# Patient Record
Sex: Male | Born: 1935 | Race: White | Hispanic: No | Marital: Married | Smoking: Never smoker
Health system: Southern US, Community
[De-identification: ages and names within clinical notes are randomized; demographics above are authoritative.]

## PROBLEM LIST (undated history)

## (undated) DIAGNOSIS — C439 Malignant melanoma of skin, unspecified: Secondary | ICD-10-CM

## (undated) DIAGNOSIS — I429 Cardiomyopathy, unspecified: Secondary | ICD-10-CM

## (undated) DIAGNOSIS — C4491 Basal cell carcinoma of skin, unspecified: Secondary | ICD-10-CM

## (undated) DIAGNOSIS — I509 Heart failure, unspecified: Secondary | ICD-10-CM

## (undated) DIAGNOSIS — I4891 Unspecified atrial fibrillation: Secondary | ICD-10-CM

## (undated) DIAGNOSIS — M1990 Unspecified osteoarthritis, unspecified site: Secondary | ICD-10-CM

## (undated) DIAGNOSIS — I38 Endocarditis, valve unspecified: Secondary | ICD-10-CM

## (undated) DIAGNOSIS — K219 Gastro-esophageal reflux disease without esophagitis: Secondary | ICD-10-CM

## (undated) DIAGNOSIS — F32A Depression, unspecified: Secondary | ICD-10-CM

## (undated) DIAGNOSIS — F419 Anxiety disorder, unspecified: Secondary | ICD-10-CM

## (undated) DIAGNOSIS — I639 Cerebral infarction, unspecified: Secondary | ICD-10-CM

## (undated) DIAGNOSIS — I499 Cardiac arrhythmia, unspecified: Secondary | ICD-10-CM

## (undated) DIAGNOSIS — R251 Tremor, unspecified: Secondary | ICD-10-CM

## (undated) DIAGNOSIS — H3530 Unspecified macular degeneration: Secondary | ICD-10-CM

## (undated) DIAGNOSIS — F411 Generalized anxiety disorder: Secondary | ICD-10-CM

## (undated) DIAGNOSIS — E785 Hyperlipidemia, unspecified: Secondary | ICD-10-CM

## (undated) DIAGNOSIS — Z87442 Personal history of urinary calculi: Secondary | ICD-10-CM

## (undated) DIAGNOSIS — F319 Bipolar disorder, unspecified: Secondary | ICD-10-CM

## (undated) DIAGNOSIS — E039 Hypothyroidism, unspecified: Secondary | ICD-10-CM

## (undated) DIAGNOSIS — F329 Major depressive disorder, single episode, unspecified: Secondary | ICD-10-CM

## (undated) HISTORY — DX: Basal cell carcinoma of skin, unspecified: C44.91

## (undated) HISTORY — DX: Gastro-esophageal reflux disease without esophagitis: K21.9

## (undated) HISTORY — DX: Unspecified atrial fibrillation: I48.91

## (undated) HISTORY — DX: Personal history of urinary calculi: Z87.442

## (undated) HISTORY — DX: Unspecified osteoarthritis, unspecified site: M19.90

## (undated) HISTORY — PX: CATARACT EXTRACTION W/ INTRAOCULAR LENS IMPLANT: SHX1309

## (undated) HISTORY — DX: Heart failure, unspecified: I50.9

## (undated) HISTORY — DX: Bipolar disorder, unspecified: F31.9

## (undated) HISTORY — DX: Cardiac arrhythmia, unspecified: I49.9

## (undated) HISTORY — PX: JOINT REPLACEMENT: SHX530

## (undated) HISTORY — DX: Cerebral infarction, unspecified: I63.9

## (undated) HISTORY — PX: SKIN CANCER EXCISION: SHX779

## (undated) HISTORY — DX: Generalized anxiety disorder: F41.1

## (undated) HISTORY — PX: EYE SURGERY: SHX253

## (undated) HISTORY — DX: Anxiety disorder, unspecified: F41.9

## (undated) HISTORY — DX: Unspecified macular degeneration: H35.30

---

## 1989-04-12 HISTORY — PX: BACK SURGERY: SHX140

## 2006-04-12 DIAGNOSIS — C4491 Basal cell carcinoma of skin, unspecified: Secondary | ICD-10-CM

## 2006-04-12 HISTORY — DX: Basal cell carcinoma of skin, unspecified: C44.91

## 2009-02-20 ENCOUNTER — Ambulatory Visit: Payer: Self-pay | Admitting: Family Medicine

## 2009-02-20 DIAGNOSIS — F319 Bipolar disorder, unspecified: Secondary | ICD-10-CM

## 2009-02-24 LAB — CONVERTED CEMR LAB
ALT: 19 units/L (ref 0–53)
Basophils Absolute: 0 10*3/uL (ref 0.0–0.1)
Basophils Relative: 0.2 % (ref 0.0–3.0)
Chloride: 105 meq/L (ref 96–112)
Cholesterol: 169 mg/dL (ref 0–200)
Creatinine, Ser: 1 mg/dL (ref 0.4–1.5)
Eosinophils Absolute: 0.1 10*3/uL (ref 0.0–0.7)
Folate: 7.7 ng/mL
HDL: 51.7 mg/dL (ref >39.00)
Hemoglobin: 13.8 g/dL (ref 13.0–17.0)
Lymphocytes Relative: 45.2 % (ref 12.0–46.0)
Platelets: 140 10*3/uL — ABNORMAL LOW (ref 150.0–400.0)
Potassium: 4.6 meq/L (ref 3.5–5.1)
RDW: 13.1 % (ref 11.5–14.6)
Sodium: 140 meq/L (ref 135–145)
TSH: 0.91 microintl units/mL (ref 0.35–5.50)
Triglycerides: 55 mg/dL (ref 0.0–149.0)
VLDL: 11 mg/dL (ref 0.0–40.0)
WBC: 4.7 10*3/uL (ref 4.5–10.5)

## 2009-02-27 ENCOUNTER — Telehealth (INDEPENDENT_AMBULATORY_CARE_PROVIDER_SITE_OTHER): Payer: Self-pay | Admitting: *Deleted

## 2009-03-03 ENCOUNTER — Telehealth: Payer: Self-pay | Admitting: Family Medicine

## 2009-03-04 ENCOUNTER — Ambulatory Visit: Payer: Self-pay | Admitting: Psychiatry

## 2009-03-04 ENCOUNTER — Encounter: Payer: Self-pay | Admitting: Family Medicine

## 2009-03-20 ENCOUNTER — Ambulatory Visit: Payer: Self-pay | Admitting: Family Medicine

## 2009-03-28 ENCOUNTER — Ambulatory Visit: Payer: Self-pay | Admitting: Family Medicine

## 2009-05-14 ENCOUNTER — Telehealth: Payer: Self-pay | Admitting: Family Medicine

## 2009-08-25 ENCOUNTER — Telehealth: Payer: Self-pay | Admitting: Family Medicine

## 2009-09-15 ENCOUNTER — Ambulatory Visit: Payer: Self-pay | Admitting: Family Medicine

## 2009-09-17 ENCOUNTER — Encounter: Payer: Self-pay | Admitting: Family Medicine

## 2009-11-10 DIAGNOSIS — I639 Cerebral infarction, unspecified: Secondary | ICD-10-CM

## 2009-11-10 HISTORY — DX: Cerebral infarction, unspecified: I63.9

## 2009-11-12 ENCOUNTER — Ambulatory Visit: Payer: Self-pay | Admitting: Cardiovascular Disease

## 2009-11-12 ENCOUNTER — Encounter (INDEPENDENT_AMBULATORY_CARE_PROVIDER_SITE_OTHER): Payer: Self-pay | Admitting: Neurology

## 2009-11-12 ENCOUNTER — Inpatient Hospital Stay (HOSPITAL_COMMUNITY): Admission: EM | Admit: 2009-11-12 | Discharge: 2009-11-17 | Payer: Self-pay | Admitting: Emergency Medicine

## 2009-11-13 ENCOUNTER — Ambulatory Visit: Payer: Self-pay | Admitting: Surgery

## 2009-11-13 ENCOUNTER — Encounter (INDEPENDENT_AMBULATORY_CARE_PROVIDER_SITE_OTHER): Payer: Self-pay | Admitting: Neurology

## 2009-11-14 ENCOUNTER — Encounter (INDEPENDENT_AMBULATORY_CARE_PROVIDER_SITE_OTHER): Payer: Self-pay | Admitting: Neurology

## 2009-11-14 ENCOUNTER — Encounter: Payer: Self-pay | Admitting: Cardiovascular Disease

## 2009-11-15 ENCOUNTER — Encounter: Payer: Self-pay | Admitting: Cardiovascular Disease

## 2009-11-16 ENCOUNTER — Encounter: Payer: Self-pay | Admitting: Cardiovascular Disease

## 2009-11-19 ENCOUNTER — Ambulatory Visit: Payer: Self-pay | Admitting: Cardiovascular Disease

## 2009-11-19 LAB — CONVERTED CEMR LAB

## 2009-11-24 ENCOUNTER — Ambulatory Visit: Payer: Self-pay | Admitting: Cardiovascular Disease

## 2009-11-26 ENCOUNTER — Encounter: Payer: Self-pay | Admitting: Cardiovascular Disease

## 2009-11-28 ENCOUNTER — Ambulatory Visit: Payer: Self-pay | Admitting: Cardiovascular Disease

## 2009-11-28 DIAGNOSIS — I4891 Unspecified atrial fibrillation: Secondary | ICD-10-CM

## 2009-11-28 DIAGNOSIS — I6350 Cerebral infarction due to unspecified occlusion or stenosis of unspecified cerebral artery: Secondary | ICD-10-CM | POA: Insufficient documentation

## 2009-11-28 DIAGNOSIS — I4892 Unspecified atrial flutter: Secondary | ICD-10-CM | POA: Insufficient documentation

## 2009-12-01 ENCOUNTER — Encounter: Payer: Self-pay | Admitting: Family Medicine

## 2009-12-03 ENCOUNTER — Ambulatory Visit: Payer: Self-pay | Admitting: Cardiovascular Disease

## 2009-12-08 ENCOUNTER — Telehealth: Payer: Self-pay | Admitting: Cardiology

## 2009-12-08 ENCOUNTER — Telehealth: Payer: Self-pay | Admitting: Cardiovascular Disease

## 2009-12-10 ENCOUNTER — Ambulatory Visit: Payer: Self-pay | Admitting: Cardiology

## 2009-12-11 ENCOUNTER — Encounter: Payer: Self-pay | Admitting: Cardiovascular Disease

## 2009-12-17 ENCOUNTER — Ambulatory Visit: Payer: Self-pay | Admitting: Cardiovascular Disease

## 2009-12-29 ENCOUNTER — Ambulatory Visit: Payer: Self-pay | Admitting: Cardiovascular Disease

## 2010-01-05 ENCOUNTER — Ambulatory Visit: Payer: Self-pay | Admitting: Internal Medicine

## 2010-01-05 LAB — CONVERTED CEMR LAB
INR: 2.1
POC INR: 2.1

## 2010-01-12 ENCOUNTER — Telehealth: Payer: Self-pay | Admitting: Family Medicine

## 2010-01-21 ENCOUNTER — Ambulatory Visit: Payer: Self-pay | Admitting: Cardiovascular Disease

## 2010-01-21 LAB — CONVERTED CEMR LAB: POC INR: 1.8

## 2010-02-04 ENCOUNTER — Ambulatory Visit: Payer: Self-pay | Admitting: Cardiovascular Disease

## 2010-02-04 LAB — CONVERTED CEMR LAB: POC INR: 2.5

## 2010-03-04 ENCOUNTER — Ambulatory Visit: Payer: Self-pay | Admitting: Cardiovascular Disease

## 2010-03-24 ENCOUNTER — Ambulatory Visit: Payer: Self-pay | Admitting: Family Medicine

## 2010-03-24 ENCOUNTER — Telehealth: Payer: Self-pay | Admitting: Family Medicine

## 2010-03-25 ENCOUNTER — Ambulatory Visit: Payer: Self-pay | Admitting: Cardiovascular Disease

## 2010-03-25 LAB — CONVERTED CEMR LAB: POC INR: 2.5

## 2010-03-26 LAB — CONVERTED CEMR LAB
Chloride: 102 meq/L (ref 96–112)
GFR calc non Af Amer: 71.9 mL/min (ref >60.00)
LDL Cholesterol: 122 mg/dL — ABNORMAL HIGH (ref 0–99)
Phosphorus: 3 mg/dL (ref 2.3–4.6)
Potassium: 4 meq/L (ref 3.5–5.1)
Sodium: 139 meq/L (ref 135–145)
Total CHOL/HDL Ratio: 4
Triglycerides: 59 mg/dL (ref 0.0–149.0)
VLDL: 11.8 mg/dL (ref 0.0–40.0)

## 2010-04-21 ENCOUNTER — Telehealth: Payer: Self-pay | Admitting: Family Medicine

## 2010-04-22 ENCOUNTER — Ambulatory Visit: Admission: RE | Admit: 2010-04-22 | Discharge: 2010-04-22 | Payer: Self-pay | Source: Home / Self Care

## 2010-04-22 LAB — CONVERTED CEMR LAB: POC INR: 2.1

## 2010-05-12 NOTE — Medication Information (Signed)
Summary: rov/ewj  Anticoagulant Therapy  Managed by: Cloyde Reams, RN, BSN PCP: Dr Franne Forts MD: Mariah Milling Indication 1: Atrial Fibrillation Indication 2: Cerebrovascular Accident Lab Used: [facility] [place] Site: [place] INR POC 2.5 INR RANGE 2.0-3.0  Dietary changes: no    Health status changes: no    Bleeding/hemorrhagic complications: no    Recent/future hospitalizations: no    Any changes in medication regimen? no    Recent/future dental: no  Any missed doses?: no       Is patient compliant with meds? yes       Allergies: No Known Drug Allergies  Anticoagulation Management History:      The patient is taking warfarin and comes in today for a routine follow up visit.  Positive risk factors for bleeding include an age of 28 years or older and history of CVA/TIA.  The bleeding index is 'intermediate risk'.  Positive CHADS2 values include Age > 68 years old and Prior Stroke/CVA/TIA.  His last INR was 2.1.  Anticoagulation responsible provider: [facility].  INR POC: 2.5.  Cuvette Lot#: [id number].  Exp: 02/2011.    Anticoagulation Management Assessment/Plan:      The patient's current anticoagulation dose is Warfarin sodium 5 mg tabs: Use as directed by Anticoagulation Clinic.  The target INR is 2.0-3.0.  The next INR is due 03/04/2010.  Results were reviewed/authorized by Cloyde Reams, RN, BSN.  He was notified by Cloyde Reams RN.         Prior Anticoagulation Instructions: INR 1.8  Take 1 tablet today, then resume same dosage 1 tablet daily except 1/2 tablet on Wednesdays and Fridays.  Recheck in 2-3 weeks.    Current Anticoagulation Instructions: INR 2.5  Continue on same dosage 1 tablet daily except 1/2 tablet on Wednesdays and Fridays.  Recheck in 4 weeks.

## 2010-05-12 NOTE — Medication Information (Signed)
Summary: David Duncan  Anticoagulant Therapy  Managed by: Cloyde Reams, RN, BSN PCP: Dr Franne Forts MD: Mariah Milling Indication 1: Atrial Fibrillation Indication 2: Cerebrovascular Accident Lab Used: [facility] [place] Site: [place] INR POC 2.0 INR RANGE 2.0-3.0  Dietary changes: no    Health status changes: no    Bleeding/hemorrhagic complications: no    Recent/future hospitalizations: no    Any changes in medication regimen? no    Recent/future dental: no  Any missed doses?: no       Is patient compliant with meds? yes      Comments: Pt going out of town 9/10-9/17.   Allergies: No Known Drug Allergies  Anticoagulation Management History:      The patient is taking warfarin and comes in today for a routine follow up visit.  Positive risk factors for bleeding include an age of 75 years or older and history of CVA/TIA.  The bleeding index is 'intermediate risk'.  Positive CHADS2 values include Age > 84 years old and Prior Stroke/CVA/TIA.  Anticoagulation responsible Shenell Rogalski: [facility].  INR POC: 2.0.  Cuvette Lot#: [id number].  Exp: 01/2011.    Anticoagulation Management Assessment/Plan:      The patient's current anticoagulation dose is Warfarin sodium 5 mg tabs: Use as directed by Anticoagulation Clinic.  The target INR is 2.0-3.0.  The next INR is due 12/29/2009.  Results were reviewed/authorized by Cloyde Reams, RN, BSN.  He was notified by Cloyde Reams RN.         Prior Anticoagulation Instructions: INR 1.4   Take 1 tablet today, then start taking 1/2 tablet daily except 1 tablet on Tuesdays, Thursdays, and Saturdays.  Recheck in 1 week.    Current Anticoagulation Instructions: INR 2.0  Start taking 1 tablet daily except 1/2 tablet on Mondays, Wednesdays, and Fridays.  Recheck in 1 week.

## 2010-05-12 NOTE — Assessment & Plan Note (Signed)
Summary: ARMS ARE ACHING   Vital Signs:  Patient profile:   77 year old male Height:      66 inches Weight:      158 pounds BMI:     25.59 Temp:     97.3 degrees F oral Pulse rate:   56 / minute Pulse rhythm:   regular BP sitting:   110 / 70  (left arm) Cuff size:   regular  Vitals Entered By: Lewanda Rife LPN (September 16, 270 8:57 AM) CC: both upper arms aching pt wants PT   History of Present Illness: wants a ref to physical therapy -- his rotator cuff and arm problems are worse with weather change  no tears- just tendonitis  both arms are sore  no numbness or weakness unless hand are up in the air   grip is ok  overall feels fine    Allergies (verified): No Known Drug Allergies  Past History:  Past Medical History: Last updated: 02/20/2009 GERD 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  bipolar (has been hosp in past) kidney stones 30 years ago  skin cancer on shoulder basal cell  macular degeneration   derm  opthy   Past Surgical History: Last updated: 02/20/2009 back surgery in 1991 skin cancer 2008-- basal cell  2008 colonoscopy ok  hosp in past for bipolar depression (no suicide att)   Family History: Last updated: 02/20/2009 mother  father- stroke  brother prostate ca brother CAD  brother HTN aunt DM  uncle - depression / commited suicide   Social History: Last updated: 02/20/2009 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 Eyes:  Denies blurring and eye irritation. CV:  Denies chest pain or discomfort, lightheadness, and palpitations. Resp:  Denies cough and wheezing. GI:  Denies abdominal pain, bloody stools, and change in bowel habits. MS:  Complains of joint pain and stiffness; denies joint redness, joint swelling, cramps, and muscle  weakness. Derm:  Denies itching, lesion(s), poor wound healing, and rash. Neuro:  Denies numbness, tingling, and weakness. Heme:  Denies abnormal bruising and bleeding.  Physical Exam  General:  Well-developed,well-nourished,in no acute distress; alert,appropriate and cooperative throughout examination Head:  normocephalic, atraumatic, and no abnormalities observed.   Eyes:  vision grossly intact, pupils equal, pupils round, and pupils reactive to light.   Neck:  supple with full rom and no masses or thyromegally, no JVD or carotid bruit  Lungs:  Normal respiratory effort, chest expands symmetrically. Lungs are clear to auscultation, no crackles or wheezes. Heart:  Normal rate and regular rhythm. S1 and S2 normal without gallop, murmur, click, rub or other extra sounds. Msk:  mild acromion tenderness pain to abduct arms over 90 deg neg hawking and neer tests  no bicep tendon tenderness nl pron/ supination  nl grip  Extremities:  no CCE Neurologic:  strength normal in all extremities, sensation intact to light touch, gait normal, and DTRs symmetrical and normal.   Skin:  Intact without suspicious lesions or rashes Cervical Nodes:  No lymphadenopathy noted Psych:  nl affect    Impression & Recommendations:  Problem # 1:  ARM PAIN (ICD-729.5) Assessment New intermittent from past rotator cuff tendonitis rom today quite good  ref to PT eval and treat  Orders: Physical Therapy Referral (PT)  Complete Medication List: 1)  Lamictal 200 Mg Tabs (Lamotrigine) .... Take one by mouth daily 2)  Clonazepam 0.5 Mg Tabs (Clonazepam) .... Take 1 tablet by mouth once a day 3)  Ginko Biloba  .[name] Kitchen.. 1 by mouth three times a day 4)  Glucosamine - Chondroiton  .[name] Kitchen.. 1 by mouth two times a day 5)  L Lysine 500 Mg  .[name] KitchenMarland Kitchen. 1 by mouth once daily 6)  L Tyrosine 500 Mg  .[name] KitchenMarland Kitchen. 1 by mouth two times a day 7)  Maxi Vision  .[name] Kitchen.. 1 by mouth twice daily 8)  Neuro Plus 100 Mg  .[name] KitchenMarland Kitchen. 1 by mouth twor times daily -  for memory 9)  Saw Palmetto 450 Mg  .[name] KitchenMarland Kitchen. 1 by mouth two times a day 10)  Serenity Extra Plus  .[name] KitchenMarland Kitchen. 1 by mouth two times a day 11)  Taurine 500 Mg  .[name] KitchenMarland Kitchen. 1 by mouth two times a day 12)  Amino Acid 16109 Iu  .[name] Kitchen.. 1 by mouth two  times a day 13)  Vitamin C 500 Mg  .[name] KitchenMarland Kitchen. 1 by mouth four  times a day 14)  Fish Oil (dry Eye Max Vision ) - For [place]  .[name] Kitchen.. 4 times per day 15)  B Complex With Niacin  .[name] Kitchen.. 1 by mouth three times a day 16)  Lactate 500  .[name] KitchenMarland Kitchen. 1 by mouth two times a day 17)  Lexapro 20 Mg Tabs (Escitalopram oxalate) .[name] Kitchen.. 1 by mouth once daily as directed  Patient Instructions: 1)  please refer to physical therapy at check out  2)  update me if arm pain worsens   Current Allergies (reviewed today): No known allergies

## 2010-05-12 NOTE — Progress Notes (Signed)
Summary: refill request for clonazepam  Phone Note Refill Request Message from:  Fax from Pharmacy  Refills Requested: Medication #1:  CLONAZEPAM .25 MG 1 by mouth once daily   Last Refilled: 03/03/2009 faxed request from [facility], request is for 0.5 mg, take one half daily.  Initial call taken by: Lowella Petties CMA,  May 14, 2009 2:08 PM  Follow-up for Phone Call        px written on EMR for call in  Follow-up by: Judith Part MD,  May 14, 2009 3:24 PM  Additional Follow-up for Phone Call Additional follow up Details #1::        Called to [facility]. Additional Follow-up by: Lowella Petties CMA,  May 14, 2009 3:27 PM    Prescriptions: CLONAZEPAM .25 MG 1 by mouth once daily  #90 x 1   Entered and Authorized by:   Judith Part MD   Signed by:   Judith Part MD on 05/14/2009   Method used:   Telephoned to ...         RxID:   [id number]

## 2010-05-12 NOTE — Progress Notes (Signed)
Summary: fatigue  Phone Note Call from Patient   Caller: Patient Details for Reason: Fatigue Summary of Call: Patient c/o feeling fatigue even though the metoprolol cut back to Metoprolol 50 1/2 tablet twice a day. He checked heart rate today and is reading 60 on his treadmill. Please contact with what to do. Initial call taken by: Bishop Dublin, CMA,  December 08, 2009 1:18 PM     Appended Document: fatigue Stop metoprolol for now.   Appended Document: fatigue notified patient's wife to have David Duncan stop Metoprolol for now and to keep check of heart rate readings and blood pressure.  If he has any symptoms he will call the office. He will also call to let us know if fatigue gets any better.  Appended Document: fatigue duplicate

## 2010-05-12 NOTE — Medication Information (Signed)
Summary: rov/ewj  Anticoagulant Therapy  Managed by: Bethena Midget, RN, BSN PCP: Dr Franne Forts MD: Mariah Milling Indication 1: Atrial Fibrillation Indication 2: Cerebrovascular Accident Lab Used: [facility] [place] Site: [place] INR POC 3.4 INR RANGE 2.0-3.0  Dietary changes: yes       Details: Cut back on leafy green veggies  Health status changes: no    Bleeding/hemorrhagic complications: no    Recent/future hospitalizations: no    Any changes in medication regimen? yes       Details: Lexapro stopped approx 1 mth ago  Recent/future dental: no  Any missed doses?: no       Is patient compliant with meds? yes       Allergies: No Known Drug Allergies  Anticoagulation Management History:      The patient is taking warfarin and comes in today for a routine follow up visit.  Positive risk factors for bleeding include an age of 75 years or older and history of CVA/TIA.  The bleeding index is 'intermediate risk'.  Positive CHADS2 values include Age > 70 years old and Prior Stroke/CVA/TIA.  His last INR was 2.1.  Anticoagulation responsible provider: [facility].  INR POC: 3.4.  Cuvette Lot#: [id number].  Exp: 03/2011.    Anticoagulation Management Assessment/Plan:      The patient's current anticoagulation dose is Warfarin sodium 5 mg tabs: Use as directed by Anticoagulation Clinic.  The target INR is 2.0-3.0.  The next INR is due 03/25/2010.  Anticoagulation instructions were given to patient.  Results were reviewed/authorized by Bethena Midget, RN, BSN.  He was notified by Bethena Midget, RN, BSN.         Prior Anticoagulation Instructions: INR 2.5  Continue on same dosage 1 tablet daily except 1/2 tablet on Wednesdays and Fridays.  Recheck in 4 weeks.    Current Anticoagulation Instructions: INR 3.4 Skip today's dose then resume 1 pill everyday except 1/2 pill on Wednesdays and Fridays. Recheck in 3 weeks.

## 2010-05-12 NOTE — Medication Information (Signed)
Summary: CCR/AMD  Anticoagulant Therapy  Managed by: Cloyde Reams, RN, BSN PCP: Dr Franne Forts MD: Mariah Milling Indication 1: Atrial Fibrillation Indication 2: Cerebrovascular Accident Lab Used: [facility] [place] Site: [place] INR POC 1.8 INR RANGE 2.0-3.0  Dietary changes: no    Health status changes: no    Bleeding/hemorrhagic complications: no    Recent/future hospitalizations: no    Any changes in medication regimen? no    Recent/future dental: no  Any missed doses?: yes     Details: Missed 1 dosage of Coumadin on Monday.    Is patient compliant with meds? yes       Allergies: No Known Drug Allergies  Anticoagulation Management History:      The patient is taking warfarin and comes in today for a routine follow up visit.  Positive risk factors for bleeding include an age of 75 years or older and history of CVA/TIA.  The bleeding index is 'intermediate risk'.  Positive CHADS2 values include Age > 32 years old and Prior Stroke/CVA/TIA.  His last INR was 2.1.  Anticoagulation responsible provider: [facility].  INR POC: 1.8.  Cuvette Lot#: [id number].  Exp: 02/2011.    Anticoagulation Management Assessment/Plan:      The patient's current anticoagulation dose is Warfarin sodium 5 mg tabs: Use as directed by Anticoagulation Clinic.  The target INR is 2.0-3.0.  The next INR is due 02/11/2010.  Results were reviewed/authorized by Cloyde Reams, RN, BSN.  He was notified by Cloyde Reams RN.         Prior Anticoagulation Instructions: INR 2.1   Continue taking 1 tab daily except for 1/2 tab on Wednesday and Friday. Recheck in 2 weeks.   Current Anticoagulation Instructions: INR 1.8  Take 1 tablet today, then resume same dosage 1 tablet daily except 1/2 tablet on Wednesdays and Fridays.  Recheck in 2-3 weeks.

## 2010-05-12 NOTE — Medication Information (Signed)
Summary: rov/ewj  Anticoagulant Therapy  Managed by: Cloyde Reams, RN, BSN PCP: Dr Milinda Antis Indication 1: Atrial Fibrillation Indication 2: Cerebrovascular Accident Lab Used: [facility] [place] Site: [place] INR POC 3.5 INR RANGE 2.0-3.0  Dietary changes: no     Bleeding/hemorrhagic complications: no     Any changes in medication regimen? no     Any missed doses?: no       Is patient compliant with meds? yes       Allergies: No Known Drug Allergies  Anticoagulation Management History:      The patient is taking warfarin and comes in today for a routine follow up visit.  Positive risk factors for bleeding include an age of 75 years or older.  The bleeding index is 'intermediate risk'.  Positive CHADS2 values include Age > 75 years old.  INR POC: 3.5.  Cuvette Lot#: [id number].  Exp: 01/2011.    Anticoagulation Management Assessment/Plan:      The patient's current anticoagulation dose is Warfarin sodium 5 mg tabs: Use as directed by Anticoagulation Clinic.  The target INR is 2.0-3.0.  The next INR is due 12/03/2009.  Results were reviewed/authorized by Cloyde Reams, RN, BSN.  He was notified by Benedict Needy, RN.         Prior Anticoagulation Instructions: INR 2.9  Continue on 5mg  daily.  Recheck on Tuesday.  Current Anticoagulation Instructions: INR 3.5  No COUMADIN today.  Start taking 1 tab daily except for 1/2 tab on Tuesday. Recheck in 1 week.

## 2010-05-12 NOTE — Progress Notes (Signed)
Summary: question re appt  Phone Note Call from Patient   Caller: Patient Reason for Call: Talk to Nurse Summary of Call: pt calling to say he needs a hospital fu with dr Eden Emms tomorrow-asked when he was dc-he said 3 weeks ago-looking in echart there is nothing there to say when or that he needs to see dr Despina Hick to schedule? [phone] Initial call taken by: Glynda Jaeger,  December 08, 2009 11:58 AM  Follow-up for Phone Call        spoke with pt, he is usually followed by dr Mariah Milling in [place] and was referred back to him Deliah Goody, RN  December 08, 2009 3:50 PM

## 2010-05-12 NOTE — Progress Notes (Signed)
Summary: Clonazepam 0.5mg  refill  Phone Note From Pharmacy Call back at [phone]   Caller: [place] Call For: Dr Milinda Antis  Summary of Call: [facility] faxed request for Clonazepam 0.5mg . Pt has been taking one tablet  instead of 1/2 tablet for over one month.  Pt wants to continue taking one 0.5mg  tablet daily. (Pt's med list  has Clonazepam 0.25mg  taking one daily). Please advise.  Initial call taken by: Lewanda Rife LPN,  Aug 25, 2009 3:56 PM  Follow-up for Phone Call        px written on EMR for call in  Follow-up by: Judith Part MD,  Aug 25, 2009 4:47 PM  Additional Follow-up for Phone Call Additional follow up Details #1::        Dr Milinda Antis do you want pt to have the 0.5mg  taking one a day he is requesting or give pt 0.25mg  taking one daily which is on med list.Please advise. Lewanda Rife LPN  Aug 25, 2009 5:15 PM     Additional Follow-up for Phone Call Additional follow up Details #2::    sorry about that the .5 is ok- I will change it  Follow-up by: Judith Part MD,  Aug 25, 2009 5:24 PM  Additional Follow-up for Phone Call Additional follow up Details #3:: Details for Additional Follow-up Action Taken: Medication phoned to [place] pharmacy as instructed. Lewanda Rife LPN  Aug 25, 2009 5:34 PM   New/Updated Medications: CLONAZEPAM 0.5 MG TABS (CLONAZEPAM)   Prescriptions: CLONAZEPAM 0.5 MG TABS (CLONAZEPAM)   #90 x 0   Entered and Authorized by:   Judith Part MD   Signed by:   Lewanda Rife LPN on 45/40/9811   Method used:   Telephoned to ...         RxID:   [id number] CLONAZEPAM .25 MG 1 by mouth once daily  #90 x 0   Entered and Authorized by:   Judith Part MD   Signed by:   Lewanda Rife LPN on 78/46/9629   Method used:   Telephoned to ...         RxID:   [id number]

## 2010-05-12 NOTE — Progress Notes (Signed)
Summary: clonazepam  Phone Note Refill Request Message from:  Fax from Pharmacy on January 12, 2010 10:53 AM  Refills Requested: Medication #1:  CLONAZEPAM 0.5 MG TABS Take 1 tablet by mouth once a day   Last Refilled: 09/19/2009 Refill request from [place]. [phone]  Initial call taken by: Melody Comas,  January 12, 2010 10:53 AM  Follow-up for Phone Call        did we px this last -- ? is not in med list that it came from [place] - please ask staff at [facility]  thanks  Follow-up by: Judith Part MD,  January 12, 2010 11:04 AM  Additional Follow-up for Phone Call Additional follow up Details #1::        Spoke with Amy at [facility]. Dr. Milinda Antis last refilled on 08/25/09. Lewanda Rife LPN  January 12, 2010 11:22 AM   thanks  px written on EMR for call in  Additional Follow-up by: Judith Part MD,  January 12, 2010 11:24 AM    Additional Follow-up for Phone Call Additional follow up Details #2::    Medication phoned to [facility]  pharmacy as instructed. Lewanda Rife LPN  January 12, 2010 11:41 AM   Prescriptions: CLONAZEPAM 0.5 MG TABS (CLONAZEPAM) Take 1 tablet by mouth once a day  #90 x 0   Entered and Authorized by:   Judith Part MD   Signed by:   Lewanda Rife LPN on 82/95/6213   Method used:   Telephoned to ...         RxID:   [id number]

## 2010-05-12 NOTE — Medication Information (Signed)
Summary: rov/ewj  Anticoagulant Therapy  Managed by: Cloyde Reams, RN, BSN PCP: Dr Franne Forts MD: Mariah Milling Indication 1: Atrial Fibrillation Indication 2: Cerebrovascular Accident Lab Used: [facility] [place] Site: [place] INR POC 1.8 INR RANGE 2.0-3.0  Dietary changes: no     Bleeding/hemorrhagic complications: no     Any changes in medication regimen? no     Any missed doses?: no       Is patient compliant with meds? yes       Allergies: No Known Drug Allergies  Anticoagulation Management History:      The patient is taking warfarin and comes in today for a routine follow up visit.  Positive risk factors for bleeding include an age of 75 years or older and history of CVA/TIA.  The bleeding index is 'intermediate risk'.  Positive CHADS2 values include Age > 75 years old and Prior Stroke/CVA/TIA.  Anticoagulation responsible provider: Heavin Sebree.  INR POC: 1.8.  Cuvette Lot#: [id number].  Exp: 01/2011.    Anticoagulation Management Assessment/Plan:      The patient's current anticoagulation dose is Warfarin sodium 5 mg tabs: Use as directed by Anticoagulation Clinic.  The target INR is 2.0-3.0.  The next INR is due 01/05/2010.  Results were reviewed/authorized by Cloyde Reams, RN, BSN.  He was notified by Benedict Needy, RN.         Prior Anticoagulation Instructions: INR 2.0  Start taking 1 tablet daily except 1/2 tablet on Mondays, Wednesdays, and Fridays.  Recheck in 1 week.    Current Anticoagulation Instructions: INR 1.8  Start taking 1 tab daily except for 1/2 tab on Wednesday and Friday. Recheck in 1 week.

## 2010-05-12 NOTE — Medication Information (Signed)
Summary: rov/ewj  Anticoagulant Therapy  Managed by: Cloyde Reams, RN, BSN PCP: Dr Franne Forts MD: Shirlee Latch MD, Freida Busman Indication 1: Atrial Fibrillation Indication 2: Cerebrovascular Accident Lab Used: [facility] [place] Site: [place] INR POC 1.4 INR RANGE 2.0-3.0  Dietary changes: no    Health status changes: no    Bleeding/hemorrhagic complications: no    Recent/future hospitalizations: no    Any changes in medication regimen? no    Recent/future dental: no  Any missed doses?: no       Is patient compliant with meds? yes       Allergies: No Known Drug Allergies  Anticoagulation Management History:      The patient is taking warfarin and comes in today for a routine follow up visit.  Positive risk factors for bleeding include an age of 75 years or older and history of CVA/TIA.  The bleeding index is 'intermediate risk'.  Positive CHADS2 values include Age > 21 years old and Prior Stroke/CVA/TIA.  Anticoagulation responsible provider: Shirlee Latch MD, [place].  INR POC: 1.4.  Cuvette Lot#: [id number].  Exp: 01/2011.    Anticoagulation Management Assessment/Plan:      The patient's current anticoagulation dose is Warfarin sodium 5 mg tabs: Use as directed by Anticoagulation Clinic.  The target INR is 2.0-3.0.  The next INR is due 12/17/2009.  Results were reviewed/authorized by Cloyde Reams, RN, BSN.  He was notified by Cloyde Reams RN.         Prior Anticoagulation Instructions: INR 3.5  No COUMADIN today.  Start taking 1 tab daily except for 1/2 tab on Tuesday. Recheck in 1 week.   Current Anticoagulation Instructions: INR 1.4   Take 1 tablet today, then start taking 1/2 tablet daily except 1 tablet on Tuesdays, Thursdays, and Saturdays.  Recheck in 1 week.

## 2010-05-12 NOTE — Medication Information (Signed)
Summary: CCR/2:12 APPT-AMD  Anticoagulant Therapy  Managed by: Cloyde Reams, RN, BSN PCP: Dr Franne Forts MD: Mariah Milling Indication 1: Atrial Fibrillation Indication 2: Cerebrovascular Accident Lab Used: [facility] [place] Site: [place] INR POC 2.1 INR RANGE 2.0-3.0  Dietary changes: no     Bleeding/hemorrhagic complications: no     Any changes in medication regimen? no     Any missed doses?: yes     Details: Missed dose friday night but took the missed dose at lunch time on Saturday.   Is patient compliant with meds? yes       Allergies: No Known Drug Allergies  Anticoagulation Management History:      The patient is taking warfarin and comes in today for a routine follow up visit.  Positive risk factors for bleeding include an age of 26 years or older and history of CVA/TIA.  The bleeding index is 'intermediate risk'.  Positive CHADS2 values include Age > 64 years old and Prior Stroke/CVA/TIA.  Today's INR is 2.1.  Anticoagulation responsible provider: [facility].  INR POC: 2.1.  Cuvette Lot#: [id number].  Exp: 01/2011.    Anticoagulation Management Assessment/Plan:      The patient's current anticoagulation dose is Warfarin sodium 5 mg tabs: Use as directed by Anticoagulation Clinic.  The target INR is 2.0-3.0.  The next INR is due 01/21/2010.  Results were reviewed/authorized by Cloyde Reams, RN, BSN.  He was notified by Benedict Needy, RN.         Prior Anticoagulation Instructions: INR 1.8  Start taking 1 tab daily except for 1/2 tab on Wednesday and Friday. Recheck in 1 week.   Current Anticoagulation Instructions: INR 2.1   Continue taking 1 tab daily except for 1/2 tab on Wednesday and Friday. Recheck in 2 weeks.

## 2010-05-12 NOTE — Assessment & Plan Note (Signed)
Summary: NEW PT   Visit Type:  Initial Consult Referring Provider:  Pricilla Riffle Primary Provider:  Dr Milinda Antis  CC:  F/U [facility]. c/o of feeling fatigue.  Denies chest pain or shortness of breath..  History of Present Illness: 77 year old male with a history of bipolar disorder, presenting to [facility] on August 3 with a stroke. Noted to have atrial fibrillation on TEE. He presents to establish care.  He reports that he woke up on the third with significant right-sided deficits. CT of the brain and MRA showed stroke in the mid left posterior cerebral artery. TPA was given with improvement of his neurologic deficits within 20 minutes. Carotid Doppler showed no stenosis. 2-D echocardiogram showed low normal systolic function with no source of embolus.  A TEE was performed that showed paroxysmal atrial fibrillation with no source of embolus.  He was discharged on metoprolol 50 mg b.i.d. and warfarin. He reports that he has been doing well with no further neurologic type symptoms though he does have significant lethargy.  EKG today shows sinus bradycardia with rate of 47 beats per minute, no significant ST or T wave changes.  Current Medications (verified): 1)  Lamictal 200 Mg Tabs (Lamotrigine) .... Take One By Mouth Daily 2)  Clonazepam 0.5 Mg Tabs (Clonazepam) .... Take 1 Tablet By Mouth Once A Day 3)  Ginko Biloba .[name] Kitchen.. 1 By Mouth Three Times A Day 4)  Glucosamine - Chondroiton .[name] Kitchen.. 1 By Mouth Two Times A Day 5)  L Lysine 500 Mg .[name] Kitchen.. 1 By Mouth Once Daily 6)  L Tyrosine 500 Mg .[name] Kitchen.. 1 By Mouth Two Times A Day 7)  Maxi Vision .[name] Kitchen.. 1 By Mouth Twice Daily 8)  Neuro Plus 100 Mg .[name] Kitchen.. 1 By Mouth Twor Times Daily - For Memory 9)  Saw Palmetto 450 Mg .[name] Kitchen.. 1 By Mouth Two Times A Day 10)  Serenity Extra Plus .[name] Kitchen.. 1 By Mouth Two Times A Day 11)  Taurine 500 Mg .[name] Kitchen.. 1 By Mouth Two Times A Day 12)  Amino Acid 04540 Iu .[name] Kitchen.. 1 By Mouth Two  Times A Day 13)  Vitamin C 500 Mg .[name] Kitchen.. 1 By  Mouth Four  Times A Day 14)  Fish Oil  (Dry Eye Max Vision ) - For [place] .[name] Kitchen.. 4 Times Per Day 15)  B Complex With Niacin .[name] Kitchen.. 1 By Mouth Three Times A Day 16)  Lactate 500 .[name] Kitchen.. 1 By Mouth Two Times A Day 17)  Lexapro 20 Mg Tabs (Escitalopram Oxalate) .[name] Kitchen.. 1 By Mouth Once Daily As Directed 18)  Warfarin Sodium 5 Mg Tabs (Warfarin Sodium) .... Use As Directed By Anticoagulation Clinic 19)  Metoprolol Tartrate 50 Mg Tabs (Metoprolol Tartrate) .... 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  bipolar (has been hosp in past) kidney stones 30 years ago  skin cancer on shoulder basal cell  macular degeneration afib cva 8/11   derm  opthy  cardiol- [facility]- [facility]  Past Surgical History: Last updated: 11/21/2009 back surgery in 1991 skin cancer 2008-- basal cell  2008 colonoscopy ok  hosp in past for bipolar depression (no suicide att)  8/11 new afib with cva  Family History: Last updated: 02/20/2009 mother  father- stroke  brother prostate ca brother CAD  brother HTN aunt DM  uncle - depression / commited suicide  Social History: Last updated: 02/20/2009 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         recent CVA, minimal neurologic deficits  Vital Signs:  Patient profile:   77 year old male Height:      66 inches Weight:      170 pounds BMI:     27.54 Pulse rate:   46 / minute BP sitting:   138 / 75  (left  arm) Cuff size:   regular  Vitals Entered By: Bishop Dublin, CMA (November 28, 2009 10:19 AM)  Physical Exam  General:  Well developed, well nourished, in no acute distress. Head:  normocephalic and atraumatic Neck:  Neck supple, no JVD. No masses, thyromegaly or abnormal cervical nodes. Lungs:  Clear bilaterally to auscultation and percussion. Heart:  Non-displaced PMI, chest non-tender; regular rate and rhythm, S1, S2 without murmurs, rubs or gallops. Carotid upstroke normal, no bruit.  Pedals normal pulses. No edema, no varicosities. Abdomen:   abdomen soft and non-tender without masses Msk:  Back normal, normal gait. Muscle strength and tone normal. Pulses:  pulses normal in all 4 extremities Extremities:  No clubbing or cyanosis. Neurologic:  Alert and oriented x 3. Skin:  Intact without lesions or rashes. Psych:  Normal affect.   Impression & Recommendations:  Problem # 1:  ATRIAL FIBRILLATION (ICD-427.31)  recent episodes of paroxysmal atrial fibrillation noted during a transesophageal echo. His rate is sinus today though with significant bradycardia and associated lethargy. We will decrease his metoprolol to 25 mg b.i.d. and have him monitor his heart rate at home.  His updated medication list for this problem includes:    Warfarin Sodium 5 Mg Tabs (Warfarin sodium) ..... Use as directed by anticoagulation clinic    Metoprolol Tartrate 50 Mg Tabs (Metoprolol tartrate) .[name] Kitchen... Take 1/2  tablet by mouth twice a day  His updated medication list for this problem includes:    Warfarin Sodium 5 Mg Tabs (Warfarin sodium) ..... Use as directed by anticoagulation clinic    Metoprolol Tartrate 50 Mg Tabs (Metoprolol tartrate) .[name] Kitchen... Take 1/2  tablet by mouth twice a day  Problem # 2:  CVA (ICD-434.91)  Recent stroke on the left, TPA given with almost complete restoration of his neurologic deficit. He believes he is back to 95%. He is on warfarin, followed by our office. Most recent  INR 3.5 His updated medication list for this problem includes:    Warfarin Sodium 5 Mg Tabs (Warfarin sodium) ..... Use as directed by anticoagulation clinic  His updated medication list for this problem includes:    Warfarin Sodium 5 Mg Tabs (Warfarin sodium) ..... Use as directed by anticoagulation clinic  Other Orders: EKG w/ Interpretation (93000)  Patient Instructions: 1)  Your physician has recommended you make the following change in your medication: DECREASE metoprolol take 1/2 tab two times a day  2)  Your physician wants you to follow-up in:   6 months You will receive a reminder letter in the mail two months in advance. If you don't receive a letter, please call our office to schedule the follow-up  appointment.

## 2010-05-12 NOTE — Miscellaneous (Signed)
Summary: PT Eval & Update/[facility]  PT Eval & Update/[facility]   Imported By: Lanelle Bal 12/08/2009 09:39:24  _____________________________________________________________________  External Attachment:    Type:   Image     Comment:   External Document

## 2010-05-12 NOTE — Medication Information (Signed)
Summary: CCR/AMD  Anticoagulant Therapy  Managed by: Cloyde Reams, RN, BSN PCP: Dr Milinda Antis Indication 1: Atrial Fibrillation Indication 2: Cerebrovascular Accident Lab Used: [facility] [place] Site: [place] INR POC 2.9 INR RANGE 2.0-3.0  Dietary changes: yes       Details: Pt educated on consistancy of vit K in the diet.  Health status changes: yes       Details: S/P CVA secondary to new afib dx  Bleeding/hemorrhagic complications: yes       Details: Pt educated on incr risks of bleeding while on Coumadin, advised to call with bleeding problems.   Recent/future hospitalizations: yes       Details: In [facility] 8/3-8/8  Any changes in medication regimen? yes       Details: Updated at [facility] today.  Pt aware to call with any medication changes.   Recent/future dental: no  Any missed doses?: no       Is patient compliant with meds? yes      Comments: In hospital 8/3-8/8.  Pt started on 7.5mg  daily x 3 doses on 11/14/09.  Discharged on 8/8 on 5mg  daily.  INR on 8/8 1.94. Discussed dosage with Weston Brass, Pharm D will continue pt on 5mg  daily.  New pt education done at [facility] today. Pt going out of town will return Tuesday.    Allergies: No Known Drug Allergies  Anticoagulation Management History:      The patient comes in today for his initial visit for anticoagulation therapy.  Positive risk factors for bleeding include an age of 38 years or older.  The bleeding index is 'intermediate risk'.  Positive CHADS2 values include Age > 3 years old.  INR POC: 2.9.  Cuvette Lot#: [id number].  Exp: 01/2011.    Anticoagulation Management Assessment/Plan:      The patient's current anticoagulation dose is Warfarin sodium 5 mg tabs: Use as directed by Anticoagulation Clinic.  The target INR is 2.0-3.0.  The next INR is due 11/25/2009.  Results were reviewed/authorized by Cloyde Reams, RN, BSN.  He was notified by Cloyde Reams RN.         Current Anticoagulation Instructions: INR  2.9  Continue on 5mg  daily.  Recheck on Tuesday.

## 2010-05-12 NOTE — Medication Information (Signed)
Summary: CCR/AMD  Anticoagulant Therapy  Managed by: Cloyde Reams, RN, BSN PCP: Dr Franne Forts MD: Mariah Milling Indication 1: Atrial Fibrillation Indication 2: Cerebrovascular Accident Lab Used: [facility] [place] Site: [place] INR POC 6.7 INR RANGE 2.0-3.0  Dietary changes: no    Health status changes: no    Bleeding/hemorrhagic complications: no    Recent/future hospitalizations: no    Any changes in medication regimen? yes       Details: Decr Metoprolol to 1/2 tablet bid   Recent/future dental: no  Any missed doses?: no       Is patient compliant with meds? yes       Allergies: No Known Drug Allergies  Anticoagulation Management History:      The patient is taking warfarin and comes in today for a routine follow up visit.  Positive risk factors for bleeding include an age of 75 years or older and history of CVA/TIA.  The bleeding index is 'intermediate risk'.  Positive CHADS2 values include Age > 33 years old and Prior Stroke/CVA/TIA.  Anticoagulation responsible Yamileth Hayse: [facility].  INR POC: 6.7.  Cuvette Lot#: [id number].  Exp: 01/2011.    Anticoagulation Management Assessment/Plan:      The patient's current anticoagulation dose is Warfarin sodium 5 mg tabs: Use as directed by Anticoagulation Clinic.  The target INR is 2.0-3.0.  The next INR is due 12/10/2009.  Results were reviewed/authorized by Cloyde Reams, RN, BSN.  He was notified by Cloyde Reams RN.         Prior Anticoagulation Instructions: INR 3.5  No COUMADIN today.  Start taking 1 tab daily except for 1/2 tab on Tuesday. Recheck in 1 week.   Current Anticoagulation Instructions: INR 6.7  Hold Coumadin Wednesday, Thursday, and Friday. Then start taking 1/2 tablet daily except 1 tablet on Tuesdays, Thursdays, and Saturdays per Dr Mariah Milling.

## 2010-05-12 NOTE — Miscellaneous (Signed)
Summary: PT Eval/[facility]  PT Eval/[facility]   Imported By: Lanelle Bal 09/24/2009 10:53:35  _____________________________________________________________________  External Attachment:    Type:   Image     Comment:   External Document

## 2010-05-14 NOTE — Assessment & Plan Note (Signed)
Summary: F/U STROKE LAST AUGUST/CLE   Vital Signs:  Patient profile:   77 year old male Height:      66 inches Weight:      157.25 pounds BMI:     25.47 Temp:     98.1 degrees F oral Pulse rate:   60 / minute Pulse rhythm:   regular BP sitting:   138 / 80  (left arm) Cuff size:   regular  Vitals Entered By: Lewanda Rife LPN (March 24, 2010 12:28 PM) CC: F/u stroke last August   History of Present Illness: here for f/u of CVA he had this summer(with hx of afib) and also bipolar  is doing ok   sees Dr Mariah Milling  per notes cva was reversed with tpa and 95% improvement  has overall recovered well  originally was having some tremor on R with weakness -- gradually improving and almost back to normal  it happened very quickly   now he is coumadin -- and is tolerating that ok   wt is down 13 lb from last visit  did this on purpose - per pt -- is his goal to keep loosing - goal wt is 150  by his scales he lost 13 lb as well  is eating healthy and exercising  is walking on treadmill and doing weights at his clubhouse -- usually 4 days per week -- doing it in the ams   needs [place] to set up 6 mo appt Dr Mariah Milling in feb    bipolar -- wife thinks he is having trouble  gets low and then he gets high  is in an upswing but not quite manic   bp 138/80  this is stable   is taking 2500 micrograms B12 -- cut back to 2 per day    Allergies (verified): No Known Drug Allergies  Past History:  Past Medical History: Last updated: 11/21/2009 GERD 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  bipolar (has been hosp in past) kidney stones 30 years ago  skin cancer on shoulder basal cell  macular degeneration afib cva 8/11   derm  opthy  cardiol- [facility]- [facility]  Past Surgical History: Last updated: 11/21/2009 back surgery in 1991 skin cancer 2008-- basal cell  2008 colonoscopy ok  hosp in past for bipolar depression (no suicide att)  8/11 new afib with cva  Family History: Last  updated: 02/20/2009 mother  father- stroke  brother prostate ca brother CAD  brother HTN aunt DM  uncle - depression / commited suicide   Social History: Last updated: 02/20/2009 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 Eyes:  Denies blurring and eye pain. CV:  Denies chest pain or discomfort, palpitations, and shortness of breath with exertion. Resp:  Denies cough, shortness of breath, and wheezing. GI:  Denies abdominal pain, change in bowel habits, indigestion, and nausea. GU:  Denies urinary frequency and urinary hesitancy. MS:  Denies muscle aches and cramps. Derm:  Denies itching, lesion(s), poor wound healing, and rash. Neuro:  Denies numbness, tingling, tremors, visual disturbances, and weakness. Psych:  Complains of irritability; denies depression, panic attacks, suicidal thoughts/plans, and thoughts of violence. Endo:  Denies cold intolerance, excessive thirst, excessive urination, and heat intolerance. Heme:  Denies abnormal  bruising and bleeding.  Physical Exam  General:  Well-developed,well-nourished,in no acute distress; alert,appropriate and cooperative throughout examination Head:  normocephalic, atraumatic, and no abnormalities observed.   Eyes:  vision grossly intact, pupils equal, pupils round, and pupils reactive to light.  no conjunctival pallor, injection or icterus  Mouth:  pharynx pink and moist.   Neck:  supple with full rom and no masses or thyromegally, no JVD or carotid bruit  Chest Wall:  No deformities, masses, tenderness or gynecomastia noted. Lungs:  Normal respiratory effort, chest expands symmetrically. Lungs are clear to auscultation, no crackles or wheezes. Heart:  Normal rate and regular rhythm. S1 and S2 normal without  gallop, murmur, click, rub or other extra sounds. Abdomen:  Bowel sounds positive,abdomen soft and non-tender without masses, organomegaly or hernias noted. no renal bruits Msk:  No deformity or scoliosis noted of thoracic or lumbar spine.  no acute joint changes  Pulses:  R and L carotid,radial,femoral,dorsalis pedis and posterior tibial pulses are full and equal bilaterally Extremities:  No clubbing, cyanosis, edema, or deformity noted with normal full range of motion of all joints.   Neurologic:  sensation intact to light touch, gait normal, and DTRs symmetrical and normal.   Skin:  Intact without suspicious lesions or rashes Cervical Nodes:  No lymphadenopathy noted Inguinal Nodes:  No significant adenopathy Psych:  pt is energetic with rapid speech today  smiles frequently  not seemingly irritable   Impression & Recommendations:  Problem # 1:  CVA (ICD-434.91) Assessment Improved with full recovery with TPA now on coumadin for afib and also cva prev check lipids  His updated medication list for this problem includes:    Warfarin Sodium 5 Mg Tabs (Warfarin sodium) ..... Use as directed by anticoagulation clinic  Orders: Venipuncture (16109) TLB-Lipid Panel (80061-LIPID) TLB-Renal Function Panel (80069-RENAL) Cardiology Referral (Cardiology)  Problem # 2:  ATRIAL FIBRILLATION (ICD-427.31) Assessment: Comment Only controlled with b blocker- bp and pulse rate are good  f/u with cardiol in feb   The following medications were removed from the medication list:    Metoprolol Tartrate 50 Mg Tabs (Metoprolol tartrate) .[name] Kitchen... Take 1/2  tablet by mouth twice a day His updated medication list for this problem includes:    Warfarin Sodium 5 Mg Tabs (Warfarin sodium) ..... Use as directed by anticoagulation clinic  Orders: Cardiology Referral (Cardiology)  Problem # 3:  BIPOLAR AFFECTIVE DISORDER (ICD-296.80) Assessment: Deteriorated with worsening ups and downs  (per wife  irritable) suspect he currently is manic but her denies it  I no longer feel comfortable managing this problem  will ref pt to psychiatry  Orders: Psychiatric Referral (Psych)  Problem # 4:  SCREENING FOR LIPOID DISORDERS (ICD-V77.91) Assessment: Comment Only lab today disc imp of lipid control for cva  pt states his diet is good  Orders: Venipuncture (60454) TLB-Lipid Panel (80061-LIPID) TLB-Renal Function Panel (80069-RENAL)  Complete Medication List: 1)  Lamictal 200 Mg Tabs (Lamotrigine) .... Take one by mouth daily 2)  Clonazepam 0.5 Mg Tabs (Clonazepam) .... Take 1 tablet by mouth once a day 3)  Ginko Biloba  .[name] Kitchen.. 1 by mouth three times a day 4)  Glucosamine - Chondroiton  .[name] Kitchen.. 1 by mouth two times a day 5)  L Lysine 500 Mg  .[name] KitchenMarland Kitchen. 1 by mouth once daily 6)  L Tyrosine 500 Mg  .[name] KitchenMarland Kitchen. 1 by mouth two times a day 7)  Maxi Vision  .[name] Kitchen.. 1 by mouth twice daily 8)  Neuro Plus 100 Mg  .[name] KitchenMarland KitchenMarland Kitchen  1 by mouth twor times daily - for memory 9)  Saw Palmetto 450 Mg  .[name] KitchenMarland Kitchen. 1 by mouth two times a day 10)  Serenity Extra Plus  .[name] KitchenMarland Kitchen. 1 by mouth two times a day 11)  Taurine 500 Mg  .[name] KitchenMarland Kitchen. 1 by mouth two times a day 12)  Amino Acid 95621 Iu  .[name] Kitchen.. 1 by mouth two  times a day 13)  Vitamin C 500 Mg  .[name] KitchenMarland Kitchen. 1 by mouth four  times a day 14)  Fish Oil (dry Eye Max Vision ) - For [place]  .[name] Kitchen.. 4 times per day 15)  B Complex With Niacin  .[name] Kitchen.. 1 by mouth three times a day 16)  Lexapro 20 Mg Tabs (Escitalopram oxalate) .[name] Kitchen.. 1 by mouth once daily as directed 17)  Warfarin Sodium 5 Mg Tabs (Warfarin sodium) .... Use as directed by anticoagulation clinic 18)  Cal-lac 500 Mg Caps (Calcium lactate) .... One capsule by mouth twice a day 19)  Calcium-magnesium-potassium  .... Take 1 tablet by mouth once a day 20)  5-htp 100 Mg Caps (5-hydroxytryptophan) .... Two capsules by mouth daily 21)  Vitamin B-12 2500 Mcg Subl (Cyanocobalamin) .... Two tablets by mouth daily  Patient Instructions: 1)  we will do a psychiatrist  referral at check out  2)  we will make follow up appt with Dr Mariah Milling at check out  3)  labs today  4)  I'm glad you are making a good recovery    Orders Added: 1)  Venipuncture [36415] 2)  TLB-Lipid Panel [80061-LIPID] 3)  TLB-Renal Function Panel [80069-RENAL] 4)  Cardiology Referral [Cardiology] 5)  Psychiatric Referral [Psych] 6)  Est. Patient Level IV [30865]    Current Allergies (reviewed today): No known allergies

## 2010-05-14 NOTE — Progress Notes (Signed)
Summary: Lamotrigine 200mg   Phone Note Refill Request Call back at [phone] Message from:  [facility] on April 21, 2010 9:52 AM  Refills Requested: Medication #1:  LAMICTAL 200 MG TABS take one by mouth daily   Last Refilled: 01/12/2010 [facility] faxed request for Lamotrigine 200mg . Last refill date 01/12/10.Please advise.    Method Requested: Telephone to Pharmacy Initial call taken by: Lewanda Rife LPN,  April 21, 2010 9:53 AM  Follow-up for Phone Call        this needs to come from his psychiatrist now  I think he had appt in end of dec  Follow-up by: Judith Part MD,  April 21, 2010 10:38 AM  Additional Follow-up for Phone Call Additional follow up Details #1::        [facility] notified as instructed by telephone. Lewanda Rife LPN  April 21, 2010 5:45 PM

## 2010-05-14 NOTE — Medication Information (Signed)
Summary: rov/ewj  Anticoagulant Therapy  Managed by: Cloyde Reams, RN, BSN PCP: Dr Franne Forts MD: Mariah Milling Indication 1: Atrial Fibrillation Indication 2: Cerebrovascular Accident Lab Used: [facility] [place] Site: [place] INR POC 2.1 INR RANGE 2.0-3.0  Dietary changes: no    Health status changes: no    Bleeding/hemorrhagic complications: no    Recent/future hospitalizations: no    Any changes in medication regimen? no    Recent/future dental: no  Any missed doses?: no       Is patient compliant with meds? yes       Allergies: No Known Drug Allergies  Anticoagulation Management History:      The patient is taking warfarin and comes in today for a routine follow up visit.  Positive risk factors for bleeding include an age of 43 years or older and history of CVA/TIA.  The bleeding index is 'intermediate risk'.  Positive CHADS2 values include Age > 47 years old and Prior Stroke/CVA/TIA.  His last INR was 2.1.  Anticoagulation responsible provider: [facility].  INR POC: 2.1.  Cuvette Lot#: [id number].  Exp: 05/2011.    Anticoagulation Management Assessment/Plan:      The patient's current anticoagulation dose is Warfarin sodium 5 mg tabs: Use as directed by Anticoagulation Clinic.  The target INR is 2.0-3.0.  The next INR is due 05/27/2010.  Anticoagulation instructions were given to patient.  Results were reviewed/authorized by Cloyde Reams, RN, BSN.  He was notified by Cloyde Reams RN.         Prior Anticoagulation Instructions: INR 2.5  Continue on same dosage 1 tablet daily except 1/2 tablet on Wednesdays and Fridays.  Recheck in 4 weeks.    Current Anticoagulation Instructions: INR 2.1  Continue on same dosage 1 tablet daily except 1/2 tablet on Wednesdays and Fridays.  Recheck in 4 weeks.

## 2010-05-14 NOTE — Progress Notes (Signed)
Summary: Update on pt's condition  Phone Note Call from Patient Call back at [phone]   Caller: Spouse Call For: Judith Part MD Summary of Call: Pt's wife called and left v/m that pt has appt at 12:15 with Dr Milinda Antis and she does not feel pt will be forthcoming with how he is doing.** Mrs. Ging does not want pt to know that she has called.**  Pt is manic state, tense and irritable. Mrs. Rumbold said he has not hurt her physically but she thinks he wants to and is holding himself back. Pt stopped Lexapro. Mrs. Meneely has appt to see Dr Milinda Antis today at 4pm. Initial call taken by: Lewanda Rife LPN,  March 24, 2010 11:56 AM  Follow-up for Phone Call        thanks for the update  I will see him today Follow-up by: Judith Part MD,  March 24, 2010 12:12 PM

## 2010-05-14 NOTE — Medication Information (Signed)
Summary: rov/tm  Anticoagulant Therapy  Managed by: Cloyde Reams, RN, BSN PCP: Dr Franne Forts MD: Mariah Milling Indication 1: Atrial Fibrillation Indication 2: Cerebrovascular Accident Lab Used: [facility] [place] Site: [place] INR POC 2.5 INR RANGE 2.0-3.0  Dietary changes: no    Health status changes: no    Bleeding/hemorrhagic complications: no    Recent/future hospitalizations: no    Any changes in medication regimen? no    Recent/future dental: no  Any missed doses?: no       Is patient compliant with meds? yes       Allergies: No Known Drug Allergies  Anticoagulation Management History:      The patient is taking warfarin and comes in today for a routine follow up visit.  Positive risk factors for bleeding include an age of 75 years or older and history of CVA/TIA.  The bleeding index is 'intermediate risk'.  Positive CHADS2 values include Age > 60 years old and Prior Stroke/CVA/TIA.  His last INR was 2.1.  Anticoagulation responsible provider: [place].  INR POC: 2.5.  Cuvette Lot#: [id number].  Exp: 03/2011.    Anticoagulation Management Assessment/Plan:      The patient's current anticoagulation dose is Warfarin sodium 5 mg tabs: Use as directed by Anticoagulation Clinic.  The target INR is 2.0-3.0.  The next INR is due 04/22/2010.  Anticoagulation instructions were given to patient.  Results were reviewed/authorized by Cloyde Reams, RN, BSN.  He was notified by Cloyde Reams RN.         Prior Anticoagulation Instructions: INR 3.4 Skip today's dose then resume 1 pill everyday except 1/2 pill on Wednesdays and Fridays. Recheck in 3 weeks.   Current Anticoagulation Instructions: INR 2.5  Continue on same dosage 1 tablet daily except 1/2 tablet on Wednesdays and Fridays.  Recheck in 4 weeks.

## 2010-05-27 ENCOUNTER — Telehealth: Payer: Self-pay | Admitting: Family Medicine

## 2010-05-27 ENCOUNTER — Encounter: Payer: Self-pay | Admitting: Cardiovascular Disease

## 2010-05-27 ENCOUNTER — Encounter (INDEPENDENT_AMBULATORY_CARE_PROVIDER_SITE_OTHER): Payer: No Typology Code available for payment source

## 2010-05-27 DIAGNOSIS — I4891 Unspecified atrial fibrillation: Secondary | ICD-10-CM

## 2010-05-27 DIAGNOSIS — Z7901 Long term (current) use of anticoagulants: Secondary | ICD-10-CM

## 2010-05-27 DIAGNOSIS — M25519 Pain in unspecified shoulder: Secondary | ICD-10-CM | POA: Insufficient documentation

## 2010-05-27 DIAGNOSIS — I6789 Other cerebrovascular disease: Secondary | ICD-10-CM

## 2010-05-27 LAB — CONVERTED CEMR LAB: POC INR: 2.8

## 2010-05-29 ENCOUNTER — Telehealth: Payer: Self-pay | Admitting: Family Medicine

## 2010-06-01 ENCOUNTER — Encounter: Payer: Self-pay | Admitting: Cardiovascular Disease

## 2010-06-01 ENCOUNTER — Ambulatory Visit (INDEPENDENT_AMBULATORY_CARE_PROVIDER_SITE_OTHER): Payer: No Typology Code available for payment source | Admitting: Cardiovascular Disease

## 2010-06-01 DIAGNOSIS — I6350 Cerebral infarction due to unspecified occlusion or stenosis of unspecified cerebral artery: Secondary | ICD-10-CM

## 2010-06-01 DIAGNOSIS — I4891 Unspecified atrial fibrillation: Secondary | ICD-10-CM

## 2010-06-01 DIAGNOSIS — E785 Hyperlipidemia, unspecified: Secondary | ICD-10-CM | POA: Insufficient documentation

## 2010-06-03 NOTE — Progress Notes (Signed)
Summary: refill request for clonazepam  Phone Note Refill Request Message from:  Fax from Pharmacy  Refills Requested: Medication #1:  CLONAZEPAM 0.5 MG TABS Take 1 tablet by mouth once a day   Last Refilled: 01/12/2010 Faxed request from [facility], [phone].  Initial call taken by: Lowella Petties CMA, AAMA,  May 29, 2010 10:09 AM  Follow-up for Phone Call        as stated before- this needs to come from his psychiatrist (see phone note from several days ago) Follow-up by: Judith Part MD,  May 29, 2010 1:45 PM  Additional Follow-up for Phone Call Additional follow up Details #1::        Patient notified.  Additional Follow-up by: Melody Comas,  May 29, 2010 1:47 PM

## 2010-06-03 NOTE — Progress Notes (Signed)
Summary: refill request for clonazepam  Phone Note Refill Request Message from:  Fax from Pharmacy  Refills Requested: Medication #1:  CLONAZEPAM 0.5 MG TABS Take 1 tablet by mouth once a day   Last Refilled: 01/12/2010 Faxed request from [facility] is on your desk.  Initial call taken by: Lowella Petties CMA, AAMA,  May 27, 2010 2:51 PM  Follow-up for Phone Call        this now needs to come from his psychiatrist  Follow-up by: Judith Part MD,  May 27, 2010 5:28 PM  Additional Follow-up for Phone Call Additional follow up Details #1::        Patient notified. Additional Follow-up by: Janee Morn CMA Duncan Dull),  May 27, 2010 5:45 PM

## 2010-06-03 NOTE — Medication Information (Signed)
Summary: ROV/EWJ/AMD  Anticoagulant Therapy  Managed by: Bethena Midget, RN, BSN PCP: Dr Franne Forts MD: Mariah Milling Indication 1: Atrial Fibrillation Indication 2: Cerebrovascular Accident Lab Used: [facility] [place] Site: [place] INR POC 2.8 INR RANGE 2.0-3.0  Dietary changes: no    Health status changes: no    Bleeding/hemorrhagic complications: no    Recent/future hospitalizations: no    Any changes in medication regimen? no    Recent/future dental: no  Any missed doses?: no       Is patient compliant with meds? yes       Allergies: No Known Drug Allergies  Anticoagulation Management History:      The patient is taking warfarin and comes in today for a routine follow up visit.  Positive risk factors for bleeding include an age of 21 years or older and history of CVA/TIA.  The bleeding index is 'intermediate risk'.  Positive CHADS2 values include Age > 72 years old and Prior Stroke/CVA/TIA.  His last INR was 2.1.  Anticoagulation responsible provider: [facility] [facility].  INR POC: 2.8.  Cuvette Lot#: [id number].  Exp: 04/2011.    Anticoagulation Management Assessment/Plan:      The patient's current anticoagulation dose is Warfarin sodium 5 mg tabs: Use as directed by Anticoagulation Clinic.  The target INR is 2.0-3.0.  The next INR is due 06/24/2010.  Anticoagulation instructions were given to patient.  Results were reviewed/authorized by Bethena Midget, RN, BSN.  He was notified by Bethena Midget, RN, BSN.         Prior Anticoagulation Instructions: INR 2.1  Continue on same dosage 1 tablet daily except 1/2 tablet on Wednesdays and Fridays.  Recheck in 4 weeks.    Current Anticoagulation Instructions: INR 2.8 Continue 5mg s daily except 2.5mg s on Wednesdays and Fridays. Recheck in 4 weeks.

## 2010-06-03 NOTE — Progress Notes (Signed)
Summary: wants referral back to physical therapy  Phone Note Call from Patient Call back at Home Phone [phone]   Caller: Patient Summary of Call: Pt had physical therapy a few months ago for shoulder pain.  It improved but now has come back.  He is asking if he can be referred back to physical therapy. He is going for accupuncture treatments for this now.    He went to [facility] before. Initial call taken by: Lowella Petties CMA, AAMA,  May 27, 2010 3:43 PM  Follow-up for Phone Call        that was for shoulder pain/ rotator cuff tendonitis  will do ref for [facility]  Follow-up by: Judith Part MD,  May 27, 2010 5:41 PM  New Problems: SHOULDER PAIN (ICD-719.41)   New Problems: SHOULDER PAIN (ICD-719.41)

## 2010-06-09 NOTE — Assessment & Plan Note (Signed)
Summary: F/U 6 MONTHS/SAB   Visit Type:  Follow-up Referring Provider:  Pricilla Riffle Primary Provider:  Dr Milinda Antis  CC:  "Doing well"..  History of Present Illness: 77 year old male with a history of bipolar disorder, presenting to [facility] on November 12 2009 with a stroke. Noted to have atrial fibrillation on TEE. He presents for routine followup.  he has been doing well. No complaints. He is active, does some exercises. No symptoms of tachycardia palpitations. He has been recovering from a stroke and has almost near recovery on the right side.   CT of the brain and MRA showed stroke in the mid left posterior cerebral artery. TPA was given with improvement of his neurologic deficits within 20 minutes. Carotid Doppler showed no stenosis. 2-D echocardiogram showed low normal systolic function with no source of embolus.  A TEE was performed that showed paroxysmal atrial fibrillation with no source of embolus.  EKG today shows sinus rhythm with rate of 65 beats per minute with no significant ST or T wave changes  Current Medications (verified): 1)  Lamictal 200 Mg Tabs (Lamotrigine) .... Take One By Mouth Daily 2)  Clonazepam 0.5 Mg Tabs (Clonazepam) .... Take 1 Tablet By Mouth Once A Day 3)  Ginko Biloba .[name] Kitchen.. 1 By Mouth Three Times A Day 4)  Glucosamine - [place] .[name] Kitchen.. 1 By Mouth Two Times A Day 5)  L Lysine 500 Mg .[name] Kitchen.. 1 By Mouth Once Daily 6)  L Tyrosine 500 Mg .[name] Kitchen.. 1 By Mouth Two Times A Day 7)  Maxi Vision .[name] Kitchen.. 1 By Mouth Twice Daily 8)  Neuro Plus 100 Mg .[name] Kitchen.. 1 By Mouth Twor Times Daily - For Memory 9)  Saw Palmetto 450 Mg .[name] Kitchen.. 1 By Mouth Two Times A Day 10)  Serenity Extra Plus .[name] Kitchen.. 1 By Mouth Two Times A Day 11)  Taurine 500 Mg .[name] Kitchen.. 1 By Mouth Two Times A Day 12)  Amino Acid 04540 Iu .[name] Kitchen.. 1 By Mouth Two  Times A Day 13)  Vitamin C 500 Mg .[name] Kitchen.. 1 By Mouth Four  Times A Day 14)  Fish Oil  (Dry Eye Max Vision ) - For [place] .[name] Kitchen.. 4 Times Per Day 15)  B Complex With  Niacin .[name] Kitchen.. 1 By Mouth Three Times A Day 16)  Lexapro 20 Mg Tabs (Escitalopram Oxalate) .[name] Kitchen.. 1 By Mouth Once Daily As Directed 17)  Warfarin Sodium 5 Mg Tabs (Warfarin Sodium) .... Use As Directed By Anticoagulation Clinic 18)  Cal-Lac 500 Mg Caps (Calcium Lactate) .... One Capsule By Mouth Twice A Day 19)  Calcium-Magnesium-Potassium .... Take 1 Tablet By Mouth Once A Day 20)  5-Htp 100 Mg Caps (5-Hydroxytryptophan) .... Two Capsules By Mouth Daily 21)  Vitamin B-12 2500 Mcg Subl (Cyanocobalamin) .... Two Tablets By Mouth Daily  Allergies (verified): No Known Drug Allergies  Past History:  Past Medical History: Last updated: 11/21/2009 GERD 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  bipolar (has been hosp in past) kidney stones 30 years ago  skin cancer on shoulder basal cell  macular degeneration afib cva 8/11   derm  opthy  cardiol- [facility]- [facility]  Past Surgical History: Last updated: 11/21/2009 back surgery in 1991 skin cancer 2008-- basal cell  2008 colonoscopy ok  hosp in past for bipolar depression (no suicide att)  8/11 new afib with cva  Family History: Last updated: 02/20/2009 mother  father- stroke  brother prostate ca brother CAD  brother HTN aunt DM  uncle - depression /  commited suicide   Social History: Last updated: 02/20/2009 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    Vital Signs:  Patient profile:   77 year old male Height:      66 inches Weight:      155 pounds BMI:     25.11 Pulse rate:   66 / minute BP sitting:   135 / 79  (left  arm) Cuff size:   regular  Vitals Entered By: Bishop Dublin, CMA (June 01, 2010 10:26 AM)  Physical Exam  General:  Well-developed,well-nourished,in no acute distress; alert,appropriate and cooperative throughout examination Head:  normocephalic, atraumatic, and no abnormalities observed.   Neck:  Neck supple, no JVD. No masses, thyromegaly or abnormal cervical nodes. Lungs:  Clear bilaterally to auscultation and percussion. Heart:  Non-displaced PMI, chest non-tender; regular rate and rhythm, S1, S2 without murmurs, rubs or gallops. Carotid upstroke normal, no bruit. Normal abdominal aortic size, no bruits. Pedals normal pulses. No edema, no varicosities. Abdomen:  Bowel sounds positive; abdomen soft and non-tender without masses Msk:  Back normal, normal gait. Muscle strength and tone normal. Pulses:  pulses normal in all 4 extremities Extremities:  No clubbing or cyanosis. Neurologic:  Alert and oriented x 3. Skin:  Intact without lesions or rashes. Psych:  Normal affect.   Impression & Recommendations:  Problem # 1:  ATRIAL FIBRILLATION (ICD-427.31) currently in sinus rhythm. No symptoms of atrial fibrillation though he was asymptomatic previously at the time of his stroke. Continue current management.  His updated medication list for this problem includes:    Warfarin Sodium 5 Mg Tabs (Warfarin sodium) ..... Use as directed by anticoagulation clinic  Orders: EKG w/ Interpretation (93000)  His updated medication list for this problem includes:    Warfarin Sodium 5 Mg Tabs (Warfarin sodium) ..... Use as directed by anticoagulation clinic  Problem # 2:  CVA (ICD-434.91) Almost full recovery with previous deficits on the right. Continue warfarin.  His updated medication list for this problem includes:    Warfarin Sodium 5 Mg Tabs (Warfarin sodium) ..... Use as directed by anticoagulation clinic  Problem # 3:  HYPERLIPIDEMIA-MIXED (ICD-272.4) given his history of  stroke, though it is likely secondary to atrial fibrillation, she does have a strong family history with father having heart attacks at early age and stroke, 2 brothers having heart attack, we have suggested he start a low-dose cholesterol pill. We will start simvastatin 20 mg daily with check of his cholesterol in 3 months.  Patient Instructions: 1)  Your physician recommends that you schedule a follow-up appointment in: 1 year 2)  TO BE SCHEDULED IN MAY 2012: Your physician recommends that you return for a FASTING lipid profile: (Lipid/LFT) 3)  Your physician has recommended you make the following change in your medication: START Simvastatin 20mg  once daily. Prescriptions: SIMVASTATIN 20 MG TABS (SIMVASTATIN) Take one tablet by mouth daily at bedtime  #30 x 6   Entered by:   Lanny Hurst RN   Authorized by:   Dossie Arbour MD   Signed by:   Lanny Hurst RN on 06/01/2010  Method used:   Electronically to        [facility]* (retail)       [address]       [place], [place]  [address]       Ph: [phone]       Fax: [phone]   RxID:   [id number]

## 2010-06-13 ENCOUNTER — Encounter: Payer: Self-pay | Admitting: Family Medicine

## 2010-06-24 ENCOUNTER — Encounter: Payer: Self-pay | Admitting: Cardiology

## 2010-06-24 ENCOUNTER — Encounter (INDEPENDENT_AMBULATORY_CARE_PROVIDER_SITE_OTHER): Payer: No Typology Code available for payment source

## 2010-06-24 DIAGNOSIS — I4891 Unspecified atrial fibrillation: Secondary | ICD-10-CM

## 2010-06-24 DIAGNOSIS — Z7901 Long term (current) use of anticoagulants: Secondary | ICD-10-CM

## 2010-06-24 DIAGNOSIS — I4892 Unspecified atrial flutter: Secondary | ICD-10-CM

## 2010-06-24 LAB — CONVERTED CEMR LAB: POC INR: 2.9

## 2010-06-26 LAB — PROTIME-INR
INR: 1.13 (ref 0.00–1.49)
INR: 1.32 (ref 0.00–1.49)
Prothrombin Time: 16.6 seconds — ABNORMAL HIGH (ref 11.6–15.2)
Prothrombin Time: 22.3 seconds — ABNORMAL HIGH (ref 11.6–15.2)

## 2010-06-26 LAB — CK TOTAL AND CKMB (NOT AT ARMC)
Relative Index: INVALID (ref 0.0–2.5)
Total CK: 40 U/L (ref 7–232)

## 2010-06-26 LAB — DIFFERENTIAL
Basophils Relative: 0 % (ref 0–1)
Eosinophils Absolute: 0.2 10*3/uL (ref 0.0–0.7)
Eosinophils Relative: 2 % (ref 0–5)
Lymphs Abs: 4.7 10*3/uL — ABNORMAL HIGH (ref 0.7–4.0)
Monocytes Relative: 9 % (ref 3–12)
Neutrophils Relative %: 33 % — ABNORMAL LOW (ref 43–77)

## 2010-06-26 LAB — MRSA PCR SCREENING: MRSA by PCR: NEGATIVE

## 2010-06-26 LAB — CBC
HCT: 40.8 % (ref 39.0–52.0)
Hemoglobin: 14.4 g/dL (ref 13.0–17.0)
MCHC: 35.3 g/dL (ref 30.0–36.0)
MCV: 96.9 fL (ref 78.0–100.0)
RDW: 13.2 % (ref 11.5–15.5)

## 2010-06-26 LAB — URINALYSIS, ROUTINE W REFLEX MICROSCOPIC
Glucose, UA: NEGATIVE mg/dL
Hgb urine dipstick: NEGATIVE
Protein, ur: NEGATIVE mg/dL
Specific Gravity, Urine: 1.005 (ref 1.005–1.030)
pH: 8 (ref 5.0–8.0)

## 2010-06-26 LAB — COMPREHENSIVE METABOLIC PANEL
ALT: 14 U/L (ref 0–53)
AST: 30 U/L (ref 0–37)
Alkaline Phosphatase: 50 U/L (ref 39–117)
CO2: 26 mEq/L (ref 19–32)
Calcium: 9 mg/dL (ref 8.4–10.5)
GFR calc Af Amer: >60 mL/min (ref >60)
Glucose, Bld: 114 mg/dL — ABNORMAL HIGH (ref 70–99)
Potassium: 4 mEq/L (ref 3.5–5.1)
Sodium: 132 mEq/L — ABNORMAL LOW (ref 135–145)
Total Protein: 7.6 g/dL (ref 6.0–8.3)

## 2010-06-26 LAB — LIPID PANEL
Cholesterol: 146 mg/dL (ref 0–200)
HDL: 52 mg/dL (ref >39)
LDL Cholesterol: 85 mg/dL (ref 0–99)
Triglycerides: 44 mg/dL (ref <150)

## 2010-06-26 LAB — URINE MICROSCOPIC-ADD ON

## 2010-06-26 LAB — BASIC METABOLIC PANEL
BUN: 14 mg/dL (ref 6–23)
Chloride: 104 mEq/L (ref 96–112)
Creatinine, Ser: 1.18 mg/dL (ref 0.4–1.5)
GFR calc non Af Amer: 60 mL/min — ABNORMAL LOW (ref >60)
Glucose, Bld: 112 mg/dL — ABNORMAL HIGH (ref 70–99)
Potassium: 3.8 mEq/L (ref 3.5–5.1)

## 2010-06-26 LAB — GLUCOSE, CAPILLARY
Glucose-Capillary: 119 mg/dL — ABNORMAL HIGH (ref 70–99)
Glucose-Capillary: 121 mg/dL — ABNORMAL HIGH (ref 70–99)

## 2010-06-26 LAB — DRUGS OF ABUSE SCREEN W/O ALC, ROUTINE URINE
Cocaine Metabolites: NEGATIVE
Creatinine,U: 27.1 mg/dL
Methadone: NEGATIVE
Opiate Screen, Urine: NEGATIVE
Propoxyphene: NEGATIVE

## 2010-06-26 LAB — TROPONIN I: Troponin I: 0.01 ng/mL (ref 0.00–0.06)

## 2010-06-30 NOTE — Medication Information (Signed)
Summary: rov/tm  Anticoagulant Therapy  Managed by: Cloyde Reams, RN, BSN PCP: Dr Franne Forts MD: Shirlee Latch MD, Freida Busman Indication 1: Atrial Fibrillation Indication 2: Cerebrovascular Accident Lab Used: [facility] [place] Site: [place] INR POC 2.9 INR RANGE 2.0-3.0  Dietary changes: no    Health status changes: no    Bleeding/hemorrhagic complications: no    Recent/future hospitalizations: no    Any changes in medication regimen? yes       Details: Started on Simvastatin 20mg  daily approx 4 weeks ago.   Recent/future dental: no  Any missed doses?: no       Is patient compliant with meds? yes       Allergies: No Known Drug Allergies  Anticoagulation Management History:      The patient is taking warfarin and comes in today for a routine follow up visit.  Positive risk factors for bleeding include an age of 75 years or older and history of CVA/TIA.  The bleeding index is 'intermediate risk'.  Positive CHADS2 values include Age > 75 years old and Prior Stroke/CVA/TIA.  His last INR was 2.1.  Anticoagulation responsible provider: Shirlee Latch MD, [place].  INR POC: 2.9.  Cuvette Lot#: [id number].  Exp: 04/2011.    Anticoagulation Management Assessment/Plan:      The patient's current anticoagulation dose is Warfarin sodium 5 mg tabs: Use as directed by Anticoagulation Clinic.  The target INR is 2.0-3.0.  The next INR is due 07/22/2010.  Anticoagulation instructions were given to patient.  Results were reviewed/authorized by Cloyde Reams, RN, BSN.  He was notified by Cloyde Reams RN.         Prior Anticoagulation Instructions: INR 2.8 Continue 5mg s daily except 2.5mg s on Wednesdays and Fridays. Recheck in 4 weeks.   Current Anticoagulation Instructions: INR 2.9  Continue on same dosage 1 tablet daily except 1/2 tablet on Wednesdays and Fridays.  Recheck in 4 weeks.

## 2010-07-07 ENCOUNTER — Encounter: Payer: Self-pay | Admitting: Cardiovascular Disease

## 2010-07-07 DIAGNOSIS — I6350 Cerebral infarction due to unspecified occlusion or stenosis of unspecified cerebral artery: Secondary | ICD-10-CM

## 2010-07-07 DIAGNOSIS — I4891 Unspecified atrial fibrillation: Secondary | ICD-10-CM

## 2010-07-07 DIAGNOSIS — I4892 Unspecified atrial flutter: Secondary | ICD-10-CM

## 2010-07-22 ENCOUNTER — Ambulatory Visit (INDEPENDENT_AMBULATORY_CARE_PROVIDER_SITE_OTHER): Payer: No Typology Code available for payment source | Admitting: Emergency Medicine

## 2010-07-22 DIAGNOSIS — I4891 Unspecified atrial fibrillation: Secondary | ICD-10-CM

## 2010-07-22 DIAGNOSIS — Z7901 Long term (current) use of anticoagulants: Secondary | ICD-10-CM | POA: Insufficient documentation

## 2010-07-22 DIAGNOSIS — I6350 Cerebral infarction due to unspecified occlusion or stenosis of unspecified cerebral artery: Secondary | ICD-10-CM

## 2010-07-22 DIAGNOSIS — I4892 Unspecified atrial flutter: Secondary | ICD-10-CM

## 2010-07-22 LAB — POCT INR: INR: 3.3

## 2010-07-27 ENCOUNTER — Ambulatory Visit: Payer: Self-pay | Admitting: Family Medicine

## 2010-07-28 ENCOUNTER — Telehealth: Payer: Self-pay | Admitting: *Deleted

## 2010-07-28 DIAGNOSIS — E785 Hyperlipidemia, unspecified: Secondary | ICD-10-CM

## 2010-07-28 DIAGNOSIS — I6350 Cerebral infarction due to unspecified occlusion or stenosis of unspecified cerebral artery: Secondary | ICD-10-CM

## 2010-07-28 DIAGNOSIS — I4891 Unspecified atrial fibrillation: Secondary | ICD-10-CM

## 2010-07-28 DIAGNOSIS — T50905A Adverse effect of unspecified drugs, medicaments and biological substances, initial encounter: Secondary | ICD-10-CM

## 2010-07-28 DIAGNOSIS — Z125 Encounter for screening for malignant neoplasm of prostate: Secondary | ICD-10-CM

## 2010-07-28 NOTE — Telephone Encounter (Signed)
Here is order for labs tsh and renal and lipid and hepatic

## 2010-07-28 NOTE — Telephone Encounter (Signed)
Is it a follow up / specific problem or health mt visit ?

## 2010-07-28 NOTE — Telephone Encounter (Signed)
Pt has a 30 min appt scheduled on 08/05/10 at 11:15am for ck up. Note on appt said pt to have lipid and renal labs/Cynthia Earlene Plater.

## 2010-07-28 NOTE — Telephone Encounter (Signed)
Patient notified as instructed by telephone. Fasting Lab appt scheduled as instructed 07/31/10 at 9:25am.

## 2010-07-28 NOTE — Telephone Encounter (Signed)
Patient is coming in next week for an appt. He is asking if you will want labs. If so he would like to come in this Friday for labs so that you can discuss the results with him while he is here with you. Please advise.

## 2010-07-31 ENCOUNTER — Other Ambulatory Visit (INDEPENDENT_AMBULATORY_CARE_PROVIDER_SITE_OTHER): Payer: No Typology Code available for payment source | Admitting: Family Medicine

## 2010-07-31 DIAGNOSIS — T887XXA Unspecified adverse effect of drug or medicament, initial encounter: Secondary | ICD-10-CM

## 2010-07-31 DIAGNOSIS — Z125 Encounter for screening for malignant neoplasm of prostate: Secondary | ICD-10-CM

## 2010-07-31 DIAGNOSIS — T50905A Adverse effect of unspecified drugs, medicaments and biological substances, initial encounter: Secondary | ICD-10-CM

## 2010-07-31 DIAGNOSIS — I6350 Cerebral infarction due to unspecified occlusion or stenosis of unspecified cerebral artery: Secondary | ICD-10-CM

## 2010-07-31 DIAGNOSIS — E785 Hyperlipidemia, unspecified: Secondary | ICD-10-CM

## 2010-07-31 DIAGNOSIS — I4891 Unspecified atrial fibrillation: Secondary | ICD-10-CM

## 2010-07-31 LAB — LIPID PANEL
HDL: 57.7 mg/dL (ref >39.00)
LDL Cholesterol: 123 mg/dL — ABNORMAL HIGH (ref 0–99)
Total CHOL/HDL Ratio: 3
Triglycerides: 62 mg/dL (ref 0.0–149.0)

## 2010-07-31 LAB — RENAL FUNCTION PANEL
CO2: 28 mEq/L (ref 19–32)
Chloride: 104 mEq/L (ref 96–112)
GFR: 68.83 mL/min (ref >60.00)
Sodium: 139 mEq/L (ref 135–145)

## 2010-07-31 LAB — TSH: TSH: 1.38 u[IU]/mL (ref 0.35–5.50)

## 2010-07-31 LAB — PSA: PSA: 0.43 ng/mL (ref 0.10–4.00)

## 2010-07-31 LAB — HEPATIC FUNCTION PANEL
Albumin: 3.6 g/dL (ref 3.5–5.2)
Total Bilirubin: 0.7 mg/dL (ref 0.3–1.2)

## 2010-08-05 ENCOUNTER — Ambulatory Visit (INDEPENDENT_AMBULATORY_CARE_PROVIDER_SITE_OTHER): Payer: No Typology Code available for payment source | Admitting: Family Medicine

## 2010-08-05 ENCOUNTER — Ambulatory Visit: Payer: Self-pay | Admitting: Family Medicine

## 2010-08-05 ENCOUNTER — Encounter: Payer: Self-pay | Admitting: Family Medicine

## 2010-08-05 DIAGNOSIS — Z125 Encounter for screening for malignant neoplasm of prostate: Secondary | ICD-10-CM

## 2010-08-05 DIAGNOSIS — I4891 Unspecified atrial fibrillation: Secondary | ICD-10-CM

## 2010-08-05 DIAGNOSIS — E785 Hyperlipidemia, unspecified: Secondary | ICD-10-CM

## 2010-08-05 DIAGNOSIS — I6350 Cerebral infarction due to unspecified occlusion or stenosis of unspecified cerebral artery: Secondary | ICD-10-CM

## 2010-08-05 DIAGNOSIS — F319 Bipolar disorder, unspecified: Secondary | ICD-10-CM

## 2010-08-05 DIAGNOSIS — Z7901 Long term (current) use of anticoagulants: Secondary | ICD-10-CM

## 2010-08-05 NOTE — Assessment & Plan Note (Signed)
Under care of cardiol - Dr Mariah Milling anticoag with coumadin aysmptomatic  No further cva symptoms

## 2010-08-05 NOTE — Assessment & Plan Note (Signed)
Nl DRE and psa today No symptoms

## 2010-08-05 NOTE — Progress Notes (Signed)
Subjective:    Patient ID: David Duncan, male    DOB: [birth year], 77 y.o.   MRN: [id number]  HPI Here for check up of chronic medical problems and also to rev health mt list  Is overall feeling well Almost fully recovered from stroke  Still a little trouble with fine motor in R hand    Had cva and afib last smmer On coumadin  On zocor low dose due to this and fam hx-- he stopped taking that -- "body can only take enough poison"  Sees Dr Mariah Milling  Colon screen- had a colonoscopy 5 years ago - that was in [place] -- no abn -- told he needs one 10 years   Zoster - had vaccine 3 years ago  ptx- declines pneumovax  Flu shot - declines flu shot  Td 05  Lipids are stable with LDL of 123 Is eating a healthy diet for the most part - eating oatmeal and avoiding sat fats   Bipolar - is doing well - no dep or manic episodes Sees psychiatrist -- at the hospital   Prostate screen- nl psa of .43 No prostate or urinary problems  occ nocturia - not often   Past Medical History  Diagnosis Date  . GERD (gastroesophageal reflux disease)   . Osteoarthritis     in neck and left knee  . Bipolar disorder     hospitalized in past  . History of kidney stones     30 years ago  . Basal cell cancer 2008    on shoulder  . Macular degeneration   . Atrial fibrillation   . CVA (cerebral vascular accident) 8/11   Past Surgical History  Procedure Date  . Back surgery 1991    reports that he has never smoked. He does not have any smokeless tobacco history on file. He reports that he does not drink alcohol. His drug history not on file. family history includes Cancer in his brother; Coronary artery disease in his brother; Hypertension in his brother; and Stroke in his father. No Known Allergies       Review of Systems Review of Systems  Constitutional: Negative for fever, appetite change, fatigue and unexpected weight change.  Eyes: Negative for pain and visual disturbance.  Respiratory:  Negative for cough and shortness of breath.   Cardiovascular: Negative.   Gastrointestinal: Negative for nausea, diarrhea and constipation.  Genitourinary: Negative for urgency and frequency.  Skin: Negative for pallor.  Neurological: Negative for , light-headedness, numbness and headaches.  Hematological: Negative for adenopathy. Does not bruise/bleed easily.  Psychiatric/Behavioral: Negative for dysphoric mood. The patient is not nervous/anxious.          Objective:   Physical Exam  Constitutional: He is oriented to person, place, and time. He appears well-developed and well-nourished.  HENT:  Head: Normocephalic and atraumatic.  Right Ear: External ear normal.  Left Ear: External ear normal.  Nose: Nose normal.  Mouth/Throat: Oropharynx is clear and moist.       Scant cerumen  Eyes: Conjunctivae and EOM are normal. Pupils are equal, round, and reactive to light.  Neck: Normal range of motion. Neck supple. No JVD present. Carotid bruit is not present.  Cardiovascular: Normal rate, regular rhythm and normal heart sounds.   Pulmonary/Chest: Effort normal and breath sounds normal. No respiratory distress. He has no rales. He exhibits no tenderness.  Abdominal: Soft. Bowel sounds are normal. He exhibits no distension, no abdominal bruit and no mass. There is no tenderness.  Genitourinary: Rectum normal and prostate normal. Rectal exam shows no mass, no tenderness and anal tone normal. Guaiac negative stool. Prostate is not tender.  Musculoskeletal: Normal range of motion. He exhibits no edema and no tenderness.  Lymphadenopathy:    He has no cervical adenopathy.  Neurological: He is alert and oriented to person, place, and time. He has normal reflexes. Coordination normal.  Skin: Skin is warm and dry. No rash noted. No erythema. No pallor.  Psychiatric: He has a normal mood and affect.          Assessment & Plan:

## 2010-08-05 NOTE — Assessment & Plan Note (Signed)
Per pt doing well under psychiatric care No recent events or change in med

## 2010-08-05 NOTE — Assessment & Plan Note (Signed)
Almost recovered with a little R hand weakness  Still imp  On coumadin for afib- watched closely Disc imp of statin to further lower risks - he is nervous about that

## 2010-08-05 NOTE — Patient Instructions (Signed)
Make sure you talk to cardiology at your next visit about the cholesterol medication - discuss the pros and cons because I want you to make an informed decision  Avoid red meat/ fried foods/ egg yolks/ fatty breakfast meats/ butter, cheese and high fat dairy/ and shellfish  Stay as active as you can

## 2010-08-13 ENCOUNTER — Telehealth: Payer: Self-pay | Admitting: *Deleted

## 2010-08-13 MED ORDER — SIMVASTATIN 40 MG PO TABS: 40.0000 mg | ORAL_TABLET | Freq: Every day | ORAL | Status: DC

## 2010-08-13 NOTE — Telephone Encounter (Signed)
Spoke to pt, notified of below, sent in rx for simvastatin 40mg  qd, pt will double up his dose now until new rx needed. Scheduled pt for lipid/lft in 3 months.   He can increase simva to 40 mg daily. Cholesterol still high. We can recheck in 3 to 6 months. Tim  ----- Message ----- From: Mal Amabile, RN Sent: 08/10/2010 11:38 AM To: Antonieta Iba, MD  Yes he is taking simva 20 daily.  ----- Message ----- From: Antonieta Iba, MD Sent: 08/07/2010 4:40 PM To: Mal Amabile, RN  Cholesterol is unchanged. Is she taking simva? We started in feb. Labs from April. Tim   ----- Message ----- From: Mal Amabile, RN Sent: 08/07/2010 10:30 AM To: Antonieta Iba, MD  Dr. Mariah Milling,  I was calling this pt to schedule lipid/lft per last note 06/01/10 for May, but saw that pt just had labs 07/31/10 if you wanted to review. Just FYI. We just started him on simva 20. Thanks.

## 2010-08-19 ENCOUNTER — Ambulatory Visit (INDEPENDENT_AMBULATORY_CARE_PROVIDER_SITE_OTHER): Payer: No Typology Code available for payment source | Admitting: Emergency Medicine

## 2010-08-19 DIAGNOSIS — I4891 Unspecified atrial fibrillation: Secondary | ICD-10-CM

## 2010-08-19 DIAGNOSIS — I4892 Unspecified atrial flutter: Secondary | ICD-10-CM

## 2010-08-19 DIAGNOSIS — I6350 Cerebral infarction due to unspecified occlusion or stenosis of unspecified cerebral artery: Secondary | ICD-10-CM

## 2010-08-19 LAB — POCT INR: INR: 3.6

## 2010-09-09 ENCOUNTER — Ambulatory Visit (INDEPENDENT_AMBULATORY_CARE_PROVIDER_SITE_OTHER): Payer: No Typology Code available for payment source | Admitting: Emergency Medicine

## 2010-09-09 DIAGNOSIS — I4892 Unspecified atrial flutter: Secondary | ICD-10-CM

## 2010-09-09 DIAGNOSIS — I4891 Unspecified atrial fibrillation: Secondary | ICD-10-CM

## 2010-09-09 DIAGNOSIS — I6350 Cerebral infarction due to unspecified occlusion or stenosis of unspecified cerebral artery: Secondary | ICD-10-CM

## 2010-09-09 LAB — POCT INR: INR: 3.4

## 2010-09-30 ENCOUNTER — Ambulatory Visit (INDEPENDENT_AMBULATORY_CARE_PROVIDER_SITE_OTHER): Payer: No Typology Code available for payment source | Admitting: Emergency Medicine

## 2010-09-30 DIAGNOSIS — I6350 Cerebral infarction due to unspecified occlusion or stenosis of unspecified cerebral artery: Secondary | ICD-10-CM

## 2010-09-30 DIAGNOSIS — I4892 Unspecified atrial flutter: Secondary | ICD-10-CM

## 2010-09-30 DIAGNOSIS — I4891 Unspecified atrial fibrillation: Secondary | ICD-10-CM

## 2010-09-30 LAB — POCT INR: INR: 2.1

## 2010-10-28 ENCOUNTER — Encounter: Payer: No Typology Code available for payment source | Admitting: Emergency Medicine

## 2010-11-11 ENCOUNTER — Ambulatory Visit (INDEPENDENT_AMBULATORY_CARE_PROVIDER_SITE_OTHER): Payer: No Typology Code available for payment source | Admitting: Emergency Medicine

## 2010-11-11 DIAGNOSIS — I4892 Unspecified atrial flutter: Secondary | ICD-10-CM

## 2010-11-11 DIAGNOSIS — I4891 Unspecified atrial fibrillation: Secondary | ICD-10-CM

## 2010-11-11 DIAGNOSIS — I6350 Cerebral infarction due to unspecified occlusion or stenosis of unspecified cerebral artery: Secondary | ICD-10-CM

## 2010-11-11 LAB — POCT INR: INR: 2.1

## 2010-11-25 ENCOUNTER — Other Ambulatory Visit: Payer: No Typology Code available for payment source | Admitting: *Deleted

## 2010-11-25 ENCOUNTER — Other Ambulatory Visit: Payer: Self-pay

## 2010-11-25 ENCOUNTER — Ambulatory Visit (INDEPENDENT_AMBULATORY_CARE_PROVIDER_SITE_OTHER): Payer: No Typology Code available for payment source | Admitting: *Deleted

## 2010-11-25 DIAGNOSIS — E785 Hyperlipidemia, unspecified: Secondary | ICD-10-CM

## 2010-11-26 LAB — HEPATIC FUNCTION PANEL
AST: 28 U/L (ref 0–37)
Albumin: 4.1 g/dL (ref 3.5–5.2)
Bilirubin, Direct: 0.2 mg/dL (ref 0.0–0.3)
Total Bilirubin: 0.8 mg/dL (ref 0.3–1.2)

## 2010-11-26 LAB — LIPID PANEL
Cholesterol: 160 mg/dL (ref 0–200)
HDL: 58 mg/dL (ref >39)
Total CHOL/HDL Ratio: 2.8 Ratio
Triglycerides: 59 mg/dL (ref <150)
VLDL: 12 mg/dL (ref 0–40)

## 2010-11-29 ENCOUNTER — Encounter: Payer: Self-pay | Admitting: Cardiovascular Disease

## 2010-12-07 ENCOUNTER — Encounter: Payer: Self-pay | Admitting: Family Medicine

## 2010-12-07 ENCOUNTER — Ambulatory Visit (INDEPENDENT_AMBULATORY_CARE_PROVIDER_SITE_OTHER): Payer: No Typology Code available for payment source | Admitting: Family Medicine

## 2010-12-07 VITALS — BP 140/90 | HR 67 | Temp 98.1°F | Wt 149.0 lb

## 2010-12-07 DIAGNOSIS — H6120 Impacted cerumen, unspecified ear: Secondary | ICD-10-CM | POA: Insufficient documentation

## 2010-12-07 NOTE — Progress Notes (Signed)
Subjective:    Patient ID: David Duncan, male    DOB: [birth year], 77 y.o.   MRN: [id number]  HPI  77 yo pt of Dr. Milinda Antis, here for "stopped up ears." No URI symptoms. Has noticed past few days, ears feel full and having a harder time hearing.    Patient Active Problem List  Diagnoses  . BIPOLAR AFFECTIVE DISORDER  . ATRIAL FIBRILLATION  . ATRIAL FLUTTER  . CVA  . SHOULDER PAIN  . HYPERLIPIDEMIA-MIXED  . Encounter for long-term (current) use of anticoagulants  . Medication adverse effect  . Prostate cancer screening   Past Medical History  Diagnosis Date  . GERD (gastroesophageal reflux disease)   . Osteoarthritis     in neck and left knee  . Bipolar disorder     hospitalized in past  . History of kidney stones     30 years ago  . Basal cell cancer 2008    on shoulder  . Macular degeneration   . Atrial fibrillation   . CVA (cerebral vascular accident) 8/11   Past Surgical History  Procedure Date  . Back surgery 1991   History  Substance Use Topics  . Smoking status: Never Smoker   . Smokeless tobacco: Not on file  . Alcohol Use: No   Family History  Problem Relation Age of Onset  . Stroke Father   . Cancer Brother     prostate  . Coronary artery disease Brother   . Hypertension Brother    No Known Allergies Current Outpatient Prescriptions on File Prior to Visit  Medication Sig Dispense Refill  . 5-Hydroxytryptophan 100 MG CAPS Take 2 capsules by mouth daily       . Ascorbic Acid (VITAMIN C) 500 MG tablet Take 500 mg by mouth 4 (four) times daily.        . B Complex Vitamins (B COMPLEX PO) Take by mouth 2 (two) times daily.       . Calcium Lactate (CAL-LAC) 500 MG CAPS Take by mouth 2 (two) times daily.        . Calcium-Magnesium-Vitamin D (CALCIUM MAGNESIUM PO) Take by mouth daily.        . clonazePAM (KLONOPIN) 0.5 MG tablet Take 0.5 mg by mouth daily.       . Cyanocobalamin (VITAMIN B-12) 2500 MCG SUBL Take 2 tabs by mouth daily       .  escitalopram (LEXAPRO) 20 MG tablet Take 20 mg by mouth daily.        . Flaxseed, Linseed, (FLAX SEED OIL) 1000 MG CAPS Take 2 capsules by mouth daily.        . Ginkgo Biloba (GINKOBA PO) Take by mouth 3 (three) times daily.        . Glucosamine-Chondroitin (GLUCOSAMINE CHONDR COMPLEX PO) Take by mouth 2 (two) times daily.        [name] Kitchen lamoTRIgine (LAMICTAL) 200 MG tablet Take 200 mg by mouth daily.        . niacin 100 MG tablet Take 100 mg by mouth 3 (three) times daily.        . NON FORMULARY Triptophan 500 mg 4 tabs po qd       . NON FORMULARY Max Vision (for eyes) bid       . NON FORMULARY cholest Wise (blend) 1 tab po tid       . Phosphatidylserine (NEURO-PS PO) Take 2 tablets by mouth daily.        Malvin Johns Palmetto 450  MG CAPS Take by mouth 4 (four) times daily.       . simvastatin (ZOCOR) 40 MG tablet Take 1 tablet (40 mg total) by mouth at bedtime.  30 tablet  6  . Taurine 500 MG CAPS Take by mouth 2 (two) times daily.        . Tyrosine 500 MG CAPS Take by mouth 2 (two) times daily.        [name] Kitchen warfarin (COUMADIN) 5 MG tablet Use as directed by anticoagulation clinic          Review of Systems    See HPI Objective:   Physical Exam BP 140/90  Pulse 67  Temp(Src) 98.1 F (36.7 C) (Oral)  Wt 149 lb (67.586 kg)  Constitutional: He is oriented to person, place, and time. He appears well-developed and well-nourished.  HENT:  Head: Normocephalic and atraumatic.  +bilateral cerumen impaction.  Eyes: Conjunctivae and EOM are normal. Pupils are equal, round, and reactive to light.  Neck: Normal range of motion. Neck supple. No JVD present. Carotid bruit is not present.  Skin: Skin is warm and dry. No rash noted. No erythema. No pallor.  Psychiatric: He has a normal mood and affect.      Assessment & Plan:   1. Cerumen impaction   Ceruminosis is noted.  Wax is removed by syringing and manual debridement. Instructions for home care to prevent wax buildup are given.

## 2010-12-09 ENCOUNTER — Ambulatory Visit (INDEPENDENT_AMBULATORY_CARE_PROVIDER_SITE_OTHER): Payer: No Typology Code available for payment source | Admitting: Emergency Medicine

## 2010-12-09 DIAGNOSIS — I6350 Cerebral infarction due to unspecified occlusion or stenosis of unspecified cerebral artery: Secondary | ICD-10-CM

## 2010-12-09 DIAGNOSIS — I4891 Unspecified atrial fibrillation: Secondary | ICD-10-CM

## 2010-12-09 DIAGNOSIS — I4892 Unspecified atrial flutter: Secondary | ICD-10-CM

## 2010-12-16 ENCOUNTER — Encounter: Payer: Self-pay | Admitting: Family Medicine

## 2010-12-16 ENCOUNTER — Ambulatory Visit (INDEPENDENT_AMBULATORY_CARE_PROVIDER_SITE_OTHER): Payer: No Typology Code available for payment source | Admitting: Family Medicine

## 2010-12-16 ENCOUNTER — Telehealth: Payer: Self-pay

## 2010-12-16 VITALS — BP 136/84 | HR 60 | Temp 97.6°F | Wt 150.8 lb

## 2010-12-16 DIAGNOSIS — H9190 Unspecified hearing loss, unspecified ear: Secondary | ICD-10-CM

## 2010-12-16 MED ORDER — FLUTICASONE PROPIONATE 50 MCG/ACT NA SUSP: 2.0000 | Freq: Every day | NASAL | Status: DC

## 2010-12-16 NOTE — Patient Instructions (Addendum)
No wax in ears today. Hearing screen today - some trouble on left side. I would like you to start using nasal saline irrigation 2-3 times daily (over the counter nasal saline spray) If not better, may start nasal steroid (flonase sent to pharmacy).  Trial of this for next 1-2 weeks. Return in 2 weeks after above treatment to recheck hearing.

## 2010-12-16 NOTE — Assessment & Plan Note (Addendum)
muffled hearing, trouble with hearing screen today. Canals clear today. Question some ETD component. Treat with nasal saline.  If not improved, may start flonase. RTC 2 wks for rpt hearing screen, if failed again refer for formal audology eval.

## 2010-12-16 NOTE — Progress Notes (Signed)
  Subjective:    Patient ID: David Duncan, male    DOB: [birth year], 77 y.o.   MRN: [id number]  HPI CC: wants ears checked  Cerumen disimpaction here 8/27, still with some trouble hearing (muffled).  Some better after irrigation, but not completely better.  No pain in ears, tinnitus, fevers/chills, discharge from ears, URI sxs, congestion.  L>R ear muffled.  Review of Systems Per HPI    Objective:   Physical Exam  Nursing note and vitals reviewed. Constitutional: He appears well-developed and well-nourished. No distress.  HENT:  Head: Normocephalic and atraumatic.  Right Ear: Hearing, external ear and ear canal normal.  Left Ear: Hearing, external ear and ear canal normal.  Nose: No mucosal edema or rhinorrhea. Right sinus exhibits no maxillary sinus tenderness and no frontal sinus tenderness. Left sinus exhibits no maxillary sinus tenderness and no frontal sinus tenderness.  Mouth/Throat: Uvula is midline, oropharynx is clear and moist and mucous membranes are normal. No oropharyngeal exudate, posterior oropharyngeal edema, posterior oropharyngeal erythema or tonsillar abscesses.       Bilateral TMs cloudy  Eyes: Conjunctivae and EOM are normal. Pupils are equal, round, and reactive to light. No scleral icterus.  Neck: Normal range of motion. Neck supple.  Lymphadenopathy:    He has no cervical adenopathy.  Skin: Skin is warm and dry. No rash noted.  Psychiatric: He has a normal mood and affect.          Assessment & Plan:

## 2010-12-17 NOTE — Telephone Encounter (Signed)
Duplicate message. 

## 2010-12-22 ENCOUNTER — Encounter: Payer: Self-pay | Admitting: Family Medicine

## 2010-12-22 ENCOUNTER — Ambulatory Visit (INDEPENDENT_AMBULATORY_CARE_PROVIDER_SITE_OTHER): Payer: No Typology Code available for payment source | Admitting: Family Medicine

## 2010-12-22 VITALS — BP 120/74 | HR 57 | Temp 97.5°F | Ht 68.0 in | Wt 150.8 lb

## 2010-12-22 DIAGNOSIS — G8929 Other chronic pain: Secondary | ICD-10-CM

## 2010-12-22 DIAGNOSIS — M542 Cervicalgia: Secondary | ICD-10-CM

## 2010-12-22 NOTE — Progress Notes (Signed)
  Subjective:    Patient ID: David Duncan, male    DOB: 08-14-1930, 77 y.o.   MRN: [id number]  HPI  David Duncan, a 77 y.o. male presents today in the office for the following:    30 year history of neck pain, but has been off and on since that time. History is also significant for atrial fibrillation versus flutter with chronic anticoagulation on Coumadin. His neck pain is not really flared up any more than it used to be, but he wanted to come in and get my opinion to see if I could think of anything additional or if he needed to be worried.  Neck is hurting a bit and will sometimes pop. Nothing new or different.  R > L radiculopathy symptoms. Will come and go, even now will have some tingling and mild.   Only old injury was a skiing injury. Had some pain then.   He went to physical therapy at couple years ago for a shoulder problem, and they show him some additional neck exercises, which is he has been doing at home which seems to be helping. He is also using some horse liniment when it is bothering him a lot.  The PMH, PSH, Social History, Family History, Medications, and allergies have been reviewed in [facility], and have been updated if relevant.   Review of Systems REVIEW OF SYSTEMS  GEN: No fevers, chills. Nontoxic. Primarily MSK c/o today. MSK: Detailed in the HPI GI: tolerating PO intake without difficulty Neuro: No numbness, parasthesias, or tingling associated. Otherwise the pertinent positives of the ROS are noted above.      Objective:   Physical Exam   Physical Exam  Blood pressure 120/74, pulse 57, temperature 97.5 F (36.4 C), temperature source Oral, height 5\' 8"  (1.727 m), weight 150 lb 12.8 oz (68.402 kg), SpO2 98.00%.  GEN: WDWN, NAD, Non-toxic, A & O x 3 HEENT: Atraumatic, Normocephalic. Neck supple. No masses, No LAD. Ears and Nose: No external deformity. EXTR: No c/c/e NEURO Normal gait.  PSYCH: Normally interactive. Conversant. Not depressed or  anxious appearing.  Calm demeanor.  Bone: Very dramatic loss of motion in all planes of motion at the neck. Limited flexion and extension, bending laterally or rotation. Relatively nontender along his paracervical musculature. C5-T1 are intact. Intact in sensation and motor capacity. Tendon reflexes are 2+      Assessment & Plan:   Chronic neck pain: Think he is doing a pretty good job following his own home exercise program. I added in some exercises to work on stretching of the levator scapula that he is not currently doing, but we reviewed what he is doing, and his program is good. I think that using horse liniment intermittently is very reasonable and perfectly benign.  I offered to get xrays to assess the degree of DDD and OA, but he wanted to hold off.

## 2010-12-29 ENCOUNTER — Institutional Professional Consult (permissible substitution): Payer: No Typology Code available for payment source | Admitting: Family Medicine

## 2011-01-01 ENCOUNTER — Telehealth: Payer: Self-pay | Admitting: *Deleted

## 2011-01-01 NOTE — Telephone Encounter (Signed)
Having oral surgery at Dr. Elwyn Lade office on 01/07/11, pt has coumadin visit the day before on 01/06/11. Nurse asking that we fax INR result from 9/26 as soon as possible. Pt ok to have surgery as long as INR <3.5. Fax [phone] attn Harriett Sine.

## 2011-01-06 ENCOUNTER — Ambulatory Visit (INDEPENDENT_AMBULATORY_CARE_PROVIDER_SITE_OTHER): Payer: No Typology Code available for payment source | Admitting: Emergency Medicine

## 2011-01-06 DIAGNOSIS — I6350 Cerebral infarction due to unspecified occlusion or stenosis of unspecified cerebral artery: Secondary | ICD-10-CM

## 2011-01-06 DIAGNOSIS — I4891 Unspecified atrial fibrillation: Secondary | ICD-10-CM

## 2011-01-06 DIAGNOSIS — I4892 Unspecified atrial flutter: Secondary | ICD-10-CM

## 2011-01-06 NOTE — Telephone Encounter (Signed)
Saw pt today in [facility]R 1.6, results faxed to Dr Elwyn Lade office.

## 2011-01-20 ENCOUNTER — Ambulatory Visit (INDEPENDENT_AMBULATORY_CARE_PROVIDER_SITE_OTHER): Payer: No Typology Code available for payment source | Admitting: Emergency Medicine

## 2011-01-20 DIAGNOSIS — I4892 Unspecified atrial flutter: Secondary | ICD-10-CM

## 2011-01-20 DIAGNOSIS — I4891 Unspecified atrial fibrillation: Secondary | ICD-10-CM

## 2011-01-20 DIAGNOSIS — I6350 Cerebral infarction due to unspecified occlusion or stenosis of unspecified cerebral artery: Secondary | ICD-10-CM

## 2011-01-20 LAB — POCT INR: INR: 2.4

## 2011-02-03 ENCOUNTER — Ambulatory Visit (INDEPENDENT_AMBULATORY_CARE_PROVIDER_SITE_OTHER): Payer: No Typology Code available for payment source | Admitting: Emergency Medicine

## 2011-02-03 DIAGNOSIS — I6350 Cerebral infarction due to unspecified occlusion or stenosis of unspecified cerebral artery: Secondary | ICD-10-CM

## 2011-02-03 DIAGNOSIS — Z7901 Long term (current) use of anticoagulants: Secondary | ICD-10-CM

## 2011-02-03 DIAGNOSIS — I4892 Unspecified atrial flutter: Secondary | ICD-10-CM

## 2011-02-03 DIAGNOSIS — I4891 Unspecified atrial fibrillation: Secondary | ICD-10-CM

## 2011-02-18 ENCOUNTER — Telehealth: Payer: Self-pay | Admitting: Family Medicine

## 2011-02-18 NOTE — Telephone Encounter (Signed)
Disregard

## 2011-02-18 NOTE — Telephone Encounter (Signed)
Pt request referral for Physical Therapy

## 2011-02-18 NOTE — Telephone Encounter (Signed)
Pt called request additional physical therapy, says he was in therapy maybe 1 to 2 yrs ago at [facility]. Pt says he did not want to come in for an appt, however, today he is upset because I offered an appt to discuss additional P/T. Pt also says the physician has not treated him for this problem. I told him on yesterday, the physician would have to know what is going go before sending him to P/T. I have scheduled an appointment for Friday, Nov 9th at 12:00pm...cdavis

## 2011-02-18 NOTE — Telephone Encounter (Signed)
Thank you - I will need to see him

## 2011-02-19 ENCOUNTER — Encounter: Payer: Self-pay | Admitting: Family Medicine

## 2011-02-19 ENCOUNTER — Ambulatory Visit (INDEPENDENT_AMBULATORY_CARE_PROVIDER_SITE_OTHER): Payer: No Typology Code available for payment source | Admitting: Family Medicine

## 2011-02-19 VITALS — BP 126/88 | HR 64 | Temp 97.7°F | Ht 68.0 in | Wt 152.0 lb

## 2011-02-19 DIAGNOSIS — M542 Cervicalgia: Secondary | ICD-10-CM

## 2011-02-19 NOTE — Progress Notes (Signed)
Subjective:    Patient ID: David Duncan, male    DOB: [birth year], 77 y.o.   MRN: [id number]  HPI Is having problems with shoulder/ neck pain -- has been over a year -- got some better and then worse again  2-3 months ago -- got worse again  Sometimes cutting grass makes it worse  occ lifts something heavy   Given PT exercises to do at home from last PT  Rubber band - abd arm  Neck and head stretches -- still does them - not as much as he should   Physical therapy generally helps -- but did not completely go away  Last time told it was coming from the neck - probably some arthritis   In general does not radiate to his hands  No weakness or numbness occ starts a headache   Otherwise doing very well  Recovered totally from his stroke - has f/u with cardiology  Patient Active Problem List  Diagnoses  . BIPOLAR AFFECTIVE DISORDER  . ATRIAL FIBRILLATION  . ATRIAL FLUTTER  . CVA  . SHOULDER PAIN  . HYPERLIPIDEMIA-MIXED  . Encounter for long-term (current) use of anticoagulants  . Medication adverse effect  . Prostate cancer screening  . Hearing decreased  . Neck pain   Past Medical History  Diagnosis Date  . GERD (gastroesophageal reflux disease)   . Osteoarthritis     in neck and left knee  . Bipolar disorder     hospitalized in past  . History of kidney stones     30 years ago  . Basal cell cancer 2008    on shoulder  . Macular degeneration   . Atrial fibrillation   . CVA (cerebral vascular accident) 8/11   Past Surgical History  Procedure Date  . Back surgery 1991   History  Substance Use Topics  . Smoking status: Never Smoker   . Smokeless tobacco: Not on file  . Alcohol Use: No   Family History  Problem Relation Age of Onset  . Stroke Father   . Cancer Brother     prostate  . Coronary artery disease Brother   . Hypertension Brother    No Known Allergies Current Outpatient Prescriptions on File Prior to Visit  Medication Sig Dispense Refill    . 5-Hydroxytryptophan 100 MG CAPS Take 2 capsules by mouth daily       . Ascorbic Acid (VITAMIN C) 500 MG tablet Take 500 mg by mouth 4 (four) times daily.        . B Complex Vitamins (B COMPLEX PO) Take by mouth 2 (two) times daily.       . Calcium-Magnesium-Vitamin D (CALCIUM MAGNESIUM PO) Take by mouth daily.        . clonazePAM (KLONOPIN) 0.5 MG tablet Take 0.5 mg by mouth daily as needed.       . Cyanocobalamin (VITAMIN B-12) 2500 MCG SUBL Take 2 tabs by mouth daily       . Flaxseed, Linseed, (FLAX SEED OIL) 1000 MG CAPS Take 2 capsules by mouth daily.        . Ginkgo Biloba (GINKOBA PO) Take by mouth 3 (three) times daily.        . Glucosamine-Chondroitin (GLUCOSAMINE CHONDR COMPLEX PO) Take by mouth 2 (two) times daily.        [name] Kitchen lamoTRIgine (LAMICTAL) 200 MG tablet Take 200 mg by mouth daily.        . NON FORMULARY Max Vision (for eyes) bid       .  NON FORMULARY cholest Wise (blend) 2 tablet by mouth twice a day      . Phosphatidylserine (NEURO-PS PO) Take 2 tablets by mouth daily.        . Saw Palmetto 450 MG CAPS Take by mouth 4 (four) times daily.       . Taurine 500 MG CAPS Take by mouth 2 (two) times daily.        . Tyrosine 500 MG CAPS Take by mouth 2 (two) times daily.        [name] Kitchen warfarin (COUMADIN) 5 MG tablet Use as directed by anticoagulation clinic       . escitalopram (LEXAPRO) 20 MG tablet Take 20 mg by mouth daily.        . fluticasone (FLONASE) 50 MCG/ACT nasal spray Place 2 sprays into the nose daily.  16 g  1  . niacin 100 MG tablet Take 100 mg by mouth 3 (three) times daily.        . NON FORMULARY Triptophan 500 mg 4 tabs po qd       . simvastatin (ZOCOR) 40 MG tablet Take 1 tablet (40 mg total) by mouth at bedtime.  30 tablet  6     Review of Systems Review of Systems  Constitutional: Negative for fever, appetite change, fatigue and unexpected weight change.  Eyes: Negative for pain and visual disturbance.  Respiratory: Negative for cough and shortness of  breath.   Cardiovascular: Negative for cp or palpitations    Gastrointestinal: Negative for nausea, diarrhea and constipation.  Genitourinary: Negative for urgency and frequency.  Skin: Negative for pallor or rash   MSK pos for L neck/ upper shoulder and back pain , neg for acute joint swelling or redness  Neurological: Negative for weakness, light-headedness, numbness and headaches.  Hematological: Negative for adenopathy. Does not bruise/bleed easily.  Psychiatric/Behavioral: Negative for dysphoric mood. The patient is not nervous/anxious.          Objective:   Physical Exam  Constitutional: He appears well-developed and well-nourished. No distress.  HENT:  Head: Normocephalic and atraumatic.  Mouth/Throat: Oropharynx is clear and moist.  Eyes: Conjunctivae and EOM are normal. Pupils are equal, round, and reactive to light. No scleral icterus.  Neck: Neck supple. No JVD present. Muscular tenderness present. No spinous process tenderness present. Carotid bruit is not present. No rigidity. Erythema present. No tracheal deviation and no edema present. No thyromegaly present.       Tender over L peri cervical musculature and trapezius  Also over upper TS L musculature Neck flex and ext full with some discomfort  Also ext More pain on L rotation and tilt than R Some spasm palpated   Cardiovascular: Normal rate and normal heart sounds.   Pulmonary/Chest: Effort normal and breath sounds normal. No respiratory distress. He has no wheezes.  Musculoskeletal: He exhibits tenderness. He exhibits no edema.       Nl rom shoulders - neg hawking and neer tests  Nl int and ext rotation , and no acromion tenderness  Lymphadenopathy:    He has no cervical adenopathy.  Neurological: He is alert. He has normal strength and normal reflexes. He displays no atrophy. No cranial nerve deficit or sensory deficit. He exhibits normal muscle tone. Coordination normal.  Skin: Skin is warm and dry. No rash  noted. No erythema. No pallor.  Psychiatric:       Somewhat odd affect - pt was angry towards nurses and triage for having to come in and  wait so long  Did not mention it to me He claims his mood has been stable           Assessment & Plan:

## 2011-02-19 NOTE — Patient Instructions (Signed)
We will do referral to physical therapy at check out for neck pain that refers to the shoulder and upper back  If symptoms worsen- alert me If symptoms do not improve after PT and home exercises -- let me know also- we may want to get some films  Gentle heat on neck and a foam cervical support pillow may also help

## 2011-02-21 NOTE — Assessment & Plan Note (Signed)
This is intermittent - involving neck/ trap instead of shoulder  Ref to PT as requested  Enc him to continue home exercises ongoing - to keep up strength and flexibility  Also heat/ cervical support pillow  Adv to call if worse or any neuro symptoms  Would consider films and further eval if not improved after PT

## 2011-03-03 ENCOUNTER — Ambulatory Visit (INDEPENDENT_AMBULATORY_CARE_PROVIDER_SITE_OTHER): Payer: No Typology Code available for payment source | Admitting: Emergency Medicine

## 2011-03-03 DIAGNOSIS — I6350 Cerebral infarction due to unspecified occlusion or stenosis of unspecified cerebral artery: Secondary | ICD-10-CM

## 2011-03-03 DIAGNOSIS — I4892 Unspecified atrial flutter: Secondary | ICD-10-CM

## 2011-03-03 DIAGNOSIS — Z7901 Long term (current) use of anticoagulants: Secondary | ICD-10-CM

## 2011-03-03 DIAGNOSIS — I4891 Unspecified atrial fibrillation: Secondary | ICD-10-CM

## 2011-03-31 ENCOUNTER — Ambulatory Visit (INDEPENDENT_AMBULATORY_CARE_PROVIDER_SITE_OTHER): Payer: No Typology Code available for payment source | Admitting: Emergency Medicine

## 2011-03-31 DIAGNOSIS — I6350 Cerebral infarction due to unspecified occlusion or stenosis of unspecified cerebral artery: Secondary | ICD-10-CM

## 2011-03-31 DIAGNOSIS — I4892 Unspecified atrial flutter: Secondary | ICD-10-CM

## 2011-03-31 DIAGNOSIS — I4891 Unspecified atrial fibrillation: Secondary | ICD-10-CM

## 2011-03-31 DIAGNOSIS — Z7901 Long term (current) use of anticoagulants: Secondary | ICD-10-CM

## 2011-04-21 ENCOUNTER — Telehealth: Payer: Self-pay

## 2011-04-21 ENCOUNTER — Other Ambulatory Visit: Payer: Self-pay | Admitting: *Deleted

## 2011-04-21 MED ORDER — WARFARIN SODIUM 5 MG PO TABS: ORAL_TABLET | ORAL | Status: DC

## 2011-04-21 NOTE — Telephone Encounter (Signed)
Refill sent for warfarin  

## 2011-05-05 ENCOUNTER — Telehealth: Payer: Self-pay

## 2011-05-05 DIAGNOSIS — M542 Cervicalgia: Secondary | ICD-10-CM

## 2011-05-05 NOTE — Telephone Encounter (Signed)
Pt seen 02/2011 with neck pain and had PT/ Pt said neck pain and pain in both shoulders is no better and wants referral to Dr Annamary Rummage in [place]. (pt has never seen Dr Hyacinth Meeker). Pt will wait to hear from pt care coordinator about appt if Dr Milinda Antis will order. Pt can be reached at [phone] and uses [facility] pharmacy if needed.Please advise.

## 2011-05-05 NOTE — Telephone Encounter (Signed)
Will do ref  

## 2011-05-12 ENCOUNTER — Ambulatory Visit (INDEPENDENT_AMBULATORY_CARE_PROVIDER_SITE_OTHER): Payer: No Typology Code available for payment source | Admitting: Emergency Medicine

## 2011-05-12 DIAGNOSIS — I4891 Unspecified atrial fibrillation: Secondary | ICD-10-CM

## 2011-05-12 DIAGNOSIS — I4892 Unspecified atrial flutter: Secondary | ICD-10-CM

## 2011-05-12 DIAGNOSIS — I6350 Cerebral infarction due to unspecified occlusion or stenosis of unspecified cerebral artery: Secondary | ICD-10-CM

## 2011-05-12 DIAGNOSIS — Z7901 Long term (current) use of anticoagulants: Secondary | ICD-10-CM

## 2011-05-12 LAB — POCT INR: INR: 2.7

## 2011-05-27 ENCOUNTER — Encounter: Payer: Self-pay | Admitting: Cardiovascular Disease

## 2011-05-27 ENCOUNTER — Ambulatory Visit (INDEPENDENT_AMBULATORY_CARE_PROVIDER_SITE_OTHER): Payer: No Typology Code available for payment source | Admitting: Cardiovascular Disease

## 2011-05-27 DIAGNOSIS — I4892 Unspecified atrial flutter: Secondary | ICD-10-CM

## 2011-05-27 DIAGNOSIS — I6350 Cerebral infarction due to unspecified occlusion or stenosis of unspecified cerebral artery: Secondary | ICD-10-CM

## 2011-05-27 DIAGNOSIS — E785 Hyperlipidemia, unspecified: Secondary | ICD-10-CM

## 2011-05-27 DIAGNOSIS — I4891 Unspecified atrial fibrillation: Secondary | ICD-10-CM

## 2011-05-27 DIAGNOSIS — Z7901 Long term (current) use of anticoagulants: Secondary | ICD-10-CM

## 2011-05-27 MED ORDER — METOPROLOL TARTRATE 25 MG PO TABS: 25.0000 mg | ORAL_TABLET | Freq: Two times a day (BID) | ORAL | Status: DC

## 2011-05-27 NOTE — Progress Notes (Signed)
Patient ID: David Duncan, male    DOB: 05/03/1930, 78 y.o.   MRN: [id number]  HPI Comments: 78 year old male with a history of bipolar disorder, presenting to [facility] on November 12 2009 with a stroke. Noted to have atrial fibrillation on TEE. He presents for routine followup. Last clinic visit was one year ago.    he has been doing well. No complaints. He is active, does some exercises. No symptoms of tachycardia palpitations.  He does not check his blood pressure or pulse rate at home.   Prior  CT of the brain and MRA showed stroke in the mid left posterior cerebral artery. TPA was given with improvement of his neurologic deficits within 20 minutes.  Carotid Doppler showed no stenosis.  2-D echocardiogram showed low normal systolic function with no source of embolus. A TEE was performed that showed paroxysmal atrial fibrillation with no source of embolus.   EKG today shows Atrial fibrillation with ventricular rate 120 beats per minute EKG was repeated approximately 15 minutes later and he was in normal sinus rhythm with rate 66 beats per minute with no significant ST or T wave changes      Outpatient Encounter Prescriptions as of 05/27/2011  Medication Sig Dispense Refill  . 5-Hydroxytryptophan 100 MG CAPS Take 2 capsules by mouth daily       . Ascorbic Acid (VITAMIN C) 500 MG tablet Take 500 mg by mouth 4 (four) times daily.        . B Complex Vitamins (B COMPLEX PO) Take by mouth 2 (two) times daily.       . Calcium-Magnesium-Vitamin D (CALCIUM MAGNESIUM PO) Take by mouth daily.        . clonazePAM (KLONOPIN) 0.5 MG tablet Take 0.5 mg by mouth daily as needed.       . Cyanocobalamin (VITAMIN B-12) 2500 MCG SUBL Take 2 tabs by mouth daily       . Flaxseed, Linseed, (FLAX SEED OIL) 1000 MG CAPS Take 2 capsules by mouth daily.        . fluticasone (FLONASE) 50 MCG/ACT nasal spray Place 2 sprays into the nose daily.  16 g  1  . Ginkgo Biloba (GINKOBA PO) Take by mouth 3  (three) times daily.        . Glucosamine-Chondroitin (GLUCOSAMINE CHONDR COMPLEX PO) Take by mouth 2 (two) times daily.        [name] Kitchen lamoTRIgine (LAMICTAL) 200 MG tablet Take 200 mg by mouth daily.        . niacin 100 MG tablet Take 100 mg by mouth 3 (three) times daily.        . Saw Palmetto 450 MG CAPS Take by mouth 4 (four) times daily.       . Taurine 500 MG CAPS Take by mouth 2 (two) times daily.        . Tyrosine 500 MG CAPS Take by mouth 2 (two) times daily.        [name] Kitchen warfarin (COUMADIN) 5 MG tablet Use as directed by anticoagulation clinic  60 tablet  3     Review of Systems  Constitutional: Negative.   HENT: Negative.   Eyes: Negative.   Respiratory: Negative.   Cardiovascular: Negative.   Gastrointestinal: Negative.   Musculoskeletal: Negative.   Skin: Negative.   Neurological: Negative.   Hematological: Negative.   Psychiatric/Behavioral: Negative.   All other systems reviewed and are negative.    BP 152/93  Pulse 103  Ht 5'  8" (1.727 m)  Wt 156 lb 12.8 oz (71.124 kg)  BMI 23.84 kg/m2  Physical Exam  Nursing note and vitals reviewed. Constitutional: He is oriented to person, place, and time. He appears well-developed and well-nourished.  HENT:  Head: Normocephalic.  Nose: Nose normal.  Mouth/Throat: Oropharynx is clear and moist.  Eyes: Conjunctivae are normal. Pupils are equal, round, and reactive to light.  Neck: Normal range of motion. Neck supple. No JVD present.  Cardiovascular: Normal rate, regular rhythm, S1 normal, S2 normal and intact distal pulses.  Exam reveals no gallop and no friction rub.   Murmur heard.  Crescendo systolic murmur is present with a grade of 1/6  Pulmonary/Chest: Effort normal and breath sounds normal. No respiratory distress. He has no wheezes. He has no rales. He exhibits no tenderness.  Abdominal: Soft. Bowel sounds are normal. He exhibits no distension. There is no tenderness.  Musculoskeletal: Normal range of motion. He exhibits  no edema and no tenderness.  Lymphadenopathy:    He has no cervical adenopathy.  Neurological: He is alert and oriented to person, place, and time. Coordination normal.  Skin: Skin is warm and dry. No rash noted. No erythema.  Psychiatric: He has a normal mood and affect. His behavior is normal. Judgment and thought content normal.           Assessment and Plan

## 2011-05-27 NOTE — Assessment & Plan Note (Signed)
He appears to be doing relatively well on warfarin. He is not a high fall risk.

## 2011-05-27 NOTE — Assessment & Plan Note (Signed)
Cholesterol is at goal on the current lipid regimen. No changes to the medications were made.  

## 2011-05-27 NOTE — Assessment & Plan Note (Signed)
Atrial fibrillation at the time of his initial EKG, converting to normal sinus rhythm shortly after. He was asymptomatic while in atrial fibrillation concerning for paroxysmal atrial fibrillation. We have suggested he stay on warfarin. We will also start metoprolol. He is concerned about starting a new medication and would like to start 12.5 mg b.i.d.. I suggested after one week on this dose, he increase the dose to 25 mg b.i.d.. If he has symptoms of fatigue, he could decrease the dose back to 12.5 mg b.i.d..

## 2011-05-27 NOTE — Patient Instructions (Signed)
You are doing well. Please start metoprolol 1/2 pill twice a day (in the Am and PM) After one week, increase to a full pill twice a day. If you have significant fatigue, go back to a 1/2 pill  Please call us if you have new issues that need to be addressed before your next appt.  Your physician wants you to follow-up in: 3 months.  You will receive a reminder letter in the mail two months in advance. If you don't receive a letter, please call our office to schedule the follow-up appointment.

## 2011-05-27 NOTE — Assessment & Plan Note (Signed)
Would stay on warfarin indefinitely given paroxysmal atrial fibrillation.

## 2011-06-04 ENCOUNTER — Ambulatory Visit: Payer: No Typology Code available for payment source | Admitting: Cardiovascular Disease

## 2011-06-23 ENCOUNTER — Ambulatory Visit (INDEPENDENT_AMBULATORY_CARE_PROVIDER_SITE_OTHER): Payer: No Typology Code available for payment source

## 2011-06-23 DIAGNOSIS — I6350 Cerebral infarction due to unspecified occlusion or stenosis of unspecified cerebral artery: Secondary | ICD-10-CM

## 2011-06-23 DIAGNOSIS — I4891 Unspecified atrial fibrillation: Secondary | ICD-10-CM

## 2011-06-23 DIAGNOSIS — Z7901 Long term (current) use of anticoagulants: Secondary | ICD-10-CM

## 2011-06-23 DIAGNOSIS — I4892 Unspecified atrial flutter: Secondary | ICD-10-CM

## 2011-07-12 ENCOUNTER — Telehealth: Payer: Self-pay | Admitting: *Deleted

## 2011-07-12 DIAGNOSIS — Z125 Encounter for screening for malignant neoplasm of prostate: Secondary | ICD-10-CM

## 2011-07-12 DIAGNOSIS — I4891 Unspecified atrial fibrillation: Secondary | ICD-10-CM

## 2011-07-12 DIAGNOSIS — I6350 Cerebral infarction due to unspecified occlusion or stenosis of unspecified cerebral artery: Secondary | ICD-10-CM

## 2011-07-12 DIAGNOSIS — E785 Hyperlipidemia, unspecified: Secondary | ICD-10-CM

## 2011-07-12 NOTE — Telephone Encounter (Signed)
I ordered  future labs for check up including cholesterol

## 2011-07-12 NOTE — Telephone Encounter (Signed)
Patient has an appt on 08/10/2011 at 9:30 and he would like labs prior (lipid panel), please advise.

## 2011-07-12 NOTE — Telephone Encounter (Signed)
Patient advised as instructed via telephone, lab appt scheduled for 07/13/2011 at 9:15.

## 2011-07-13 ENCOUNTER — Encounter: Payer: Self-pay | Admitting: *Deleted

## 2011-07-13 ENCOUNTER — Other Ambulatory Visit (INDEPENDENT_AMBULATORY_CARE_PROVIDER_SITE_OTHER): Payer: No Typology Code available for payment source

## 2011-07-13 DIAGNOSIS — E785 Hyperlipidemia, unspecified: Secondary | ICD-10-CM

## 2011-07-13 DIAGNOSIS — I4891 Unspecified atrial fibrillation: Secondary | ICD-10-CM

## 2011-07-13 DIAGNOSIS — Z125 Encounter for screening for malignant neoplasm of prostate: Secondary | ICD-10-CM

## 2011-07-13 DIAGNOSIS — I6350 Cerebral infarction due to unspecified occlusion or stenosis of unspecified cerebral artery: Secondary | ICD-10-CM

## 2011-07-13 LAB — COMPREHENSIVE METABOLIC PANEL
ALT: 15 U/L (ref 0–53)
AST: 21 U/L (ref 0–37)
Chloride: 101 mEq/L (ref 96–112)
Creatinine, Ser: 1.1 mg/dL (ref 0.4–1.5)
Total Bilirubin: 0.4 mg/dL (ref 0.3–1.2)

## 2011-07-13 LAB — LIPID PANEL
HDL: 50.6 mg/dL (ref >39.00)
Total CHOL/HDL Ratio: 3
Triglycerides: 42 mg/dL (ref 0.0–149.0)

## 2011-07-13 LAB — CBC WITH DIFFERENTIAL/PLATELET
Basophils Absolute: 0 10*3/uL (ref 0.0–0.1)
Eosinophils Absolute: 0.4 10*3/uL (ref 0.0–0.7)
Hemoglobin: 14 g/dL (ref 13.0–17.0)
Lymphocytes Relative: 39.5 % (ref 12.0–46.0)
Lymphs Abs: 2.7 10*3/uL (ref 0.7–4.0)
MCHC: 33.5 g/dL (ref 30.0–36.0)
Neutro Abs: 3.1 10*3/uL (ref 1.4–7.7)
Platelets: 150 10*3/uL (ref 150.0–400.0)
RDW: 13.5 % (ref 11.5–14.6)

## 2011-08-04 ENCOUNTER — Ambulatory Visit (INDEPENDENT_AMBULATORY_CARE_PROVIDER_SITE_OTHER): Payer: No Typology Code available for payment source

## 2011-08-04 DIAGNOSIS — I4891 Unspecified atrial fibrillation: Secondary | ICD-10-CM

## 2011-08-04 DIAGNOSIS — I4892 Unspecified atrial flutter: Secondary | ICD-10-CM

## 2011-08-04 DIAGNOSIS — Z7901 Long term (current) use of anticoagulants: Secondary | ICD-10-CM

## 2011-08-04 DIAGNOSIS — I6350 Cerebral infarction due to unspecified occlusion or stenosis of unspecified cerebral artery: Secondary | ICD-10-CM

## 2011-08-04 LAB — POCT INR: INR: 2.4

## 2011-08-06 ENCOUNTER — Other Ambulatory Visit: Payer: No Typology Code available for payment source

## 2011-08-10 ENCOUNTER — Ambulatory Visit (INDEPENDENT_AMBULATORY_CARE_PROVIDER_SITE_OTHER): Payer: No Typology Code available for payment source | Admitting: Family Medicine

## 2011-08-10 ENCOUNTER — Encounter: Payer: Self-pay | Admitting: Family Medicine

## 2011-08-10 VITALS — BP 130/70 | HR 58 | Temp 98.9°F | Ht 66.25 in | Wt 154.0 lb

## 2011-08-10 DIAGNOSIS — Z125 Encounter for screening for malignant neoplasm of prostate: Secondary | ICD-10-CM

## 2011-08-10 DIAGNOSIS — T50905A Adverse effect of unspecified drugs, medicaments and biological substances, initial encounter: Secondary | ICD-10-CM

## 2011-08-10 DIAGNOSIS — E785 Hyperlipidemia, unspecified: Secondary | ICD-10-CM

## 2011-08-10 DIAGNOSIS — T887XXA Unspecified adverse effect of drug or medicament, initial encounter: Secondary | ICD-10-CM

## 2011-08-10 NOTE — Assessment & Plan Note (Signed)
Cholesterol is up a bit- disc goal of LDL under 100 if possible Is diet controlled Disc goals for lipids and reasons to control them Rev labs with pt Rev low sat fat diet in detail

## 2011-08-10 NOTE — Patient Instructions (Signed)
Keep taking good care of yourself and stay active Avoid red meat/ fried foods/ egg yolks/ fatty breakfast meats/ butter, cheese and high fat dairy/ and shellfish   Keep walking  psa and other labs are ok

## 2011-08-10 NOTE — Progress Notes (Signed)
Subjective:    Patient ID: David Duncan, male    DOB: 1930-07-17, 78 y.o.   MRN: [id number]  HPI Here for check up of chronic medical conditions and to review health mt list   Doing fine , and feels good  No new medical problems    bp is good at 130/70 Rate controlled - hx of a flutter, is on coumadin  Nothing new -feels good   Wt is down 2 lb wth bmi of 24 Eats a very healthy diet - lost weight on purpose   Checked full lab profile -- pt is on quite a few supplements Rev those today He feels that each are helpful and necessary  No renal or hepatic changes in labs Pt states -watching INR closely in light of supplements and coumadin   Lipids- Lab Results  Component Value Date   CHOL 176 07/13/2011   CHOL 160 11/25/2010   CHOL 193 07/31/2010   Lab Results  Component Value Date   HDL 50.60 07/13/2011   HDL 58 11/25/2010   HDL 57.70 07/31/2010   Lab Results  Component Value Date   LDLCALC 117* 07/13/2011   LDLCALC 90 11/25/2010   LDLCALC 123* 07/31/2010   Lab Results  Component Value Date   TRIG 42.0 07/13/2011   TRIG 59 11/25/2010   TRIG 62.0 07/31/2010   Lab Results  Component Value Date   CHOLHDL 3 07/13/2011   CHOLHDL 2.8 11/25/2010   CHOLHDL 3 07/31/2010   No results found for this basename: LDLDIRECT   diet- does stay away from fatty foods/ not much red meat  Exercise - walks daily   Zoster status- had the vaccine   Declines flu shot  Thinks he had a pneumovax in the past 2010- when in [place]   Has bipolar  Mood has been fine- stable   Lab Results  Component Value Date   PSA 0.57 07/13/2011   PSA 0.43 07/31/2010   no problems urinating - does not have nocturia No change in stream   Patient Active Problem List  Diagnoses  . BIPOLAR AFFECTIVE DISORDER  . ATRIAL FIBRILLATION  . ATRIAL FLUTTER  . CVA  . SHOULDER PAIN  . HYPERLIPIDEMIA-MIXED  . Encounter for long-term (current) use of anticoagulants  . Medication adverse effect  . Prostate cancer screening    . Hearing decreased  . Neck pain   Past Medical History  Diagnosis Date  . GERD (gastroesophageal reflux disease)   . Osteoarthritis     in neck and left knee  . Bipolar disorder     hospitalized in past  . History of kidney stones     30 years ago  . Basal cell cancer 2008    on shoulder  . Macular degeneration   . Atrial fibrillation   . CVA (cerebral vascular accident) 8/11   Past Surgical History  Procedure Date  . Back surgery 1991   History  Substance Use Topics  . Smoking status: Never Smoker   . Smokeless tobacco: Not on file  . Alcohol Use: No   Family History  Problem Relation Age of Onset  . Stroke Father   . Cancer Brother     prostate  . Coronary artery disease Brother   . Hypertension Brother    No Known Allergies Current Outpatient Prescriptions on File Prior to Visit  Medication Sig Dispense Refill  . 5-Hydroxytryptophan 100 MG CAPS Take 2 capsules by mouth daily       . Ascorbic  Acid (VITAMIN C) 500 MG tablet Take 500 mg by mouth 4 (four) times daily.        . B Complex Vitamins (B COMPLEX PO) Take by mouth 2 (two) times daily.       . clonazePAM (KLONOPIN) 0.5 MG tablet Take 0.5 mg by mouth daily as needed.       . Cyanocobalamin (VITAMIN B-12) 2500 MCG SUBL Take 2 tabs by mouth daily       . escitalopram (LEXAPRO) 20 MG tablet Take 20 mg by mouth daily.      . fluticasone (FLONASE) 50 MCG/ACT nasal spray Place 2 sprays into the nose daily.  16 g  1  . Ginkgo Biloba (GINKOBA PO) Take by mouth 3 (three) times daily.        . Glucosamine-Chondroitin (GLUCOSAMINE CHONDR COMPLEX PO) Take by mouth 2 (two) times daily.        [name] Kitchen lamoTRIgine (LAMICTAL) 200 MG tablet Take 200 mg by mouth daily.        . metoprolol tartrate (LOPRESSOR) 25 MG tablet Take 1 tablet (25 mg total) by mouth 2 (two) times daily.  60 tablet  11  . niacin 100 MG tablet Take 100 mg by mouth 3 (three) times daily.        . Saw Palmetto 450 MG CAPS Take by mouth 4 (four) times daily.        . Taurine 500 MG CAPS Take by mouth 2 (two) times daily.        . Tyrosine 500 MG CAPS Take by mouth 2 (two) times daily.        [name] Kitchen warfarin (COUMADIN) 5 MG tablet Use as directed by anticoagulation clinic  60 tablet  3       Review of Systems Review of Systems  Constitutional: Negative for fever, appetite change, fatigue and unexpected weight change.  Eyes: Negative for pain and visual disturbance.  Respiratory: Negative for cough and shortness of breath.   Cardiovascular: Negative for cp or palpitations    Gastrointestinal: Negative for nausea, diarrhea and constipation.  Genitourinary: Negative for urgency and frequency.  Skin: Negative for pallor or rash   Neurological: Negative for weakness, light-headedness, numbness and headaches.  Hematological: Negative for adenopathy. Does not bruise/bleed easily.  Psychiatric/Behavioral: Negative for dysphoric mood. The patient is not nervous/anxious.  - per pt his bipolar dz (followed by psychiatry) is under good control         Objective:   Physical Exam  Constitutional: He appears well-developed and well-nourished. No distress.  HENT:  Head: Normocephalic and atraumatic.  Mouth/Throat: No oropharyngeal exudate.  Eyes: Conjunctivae and EOM are normal. Pupils are equal, round, and reactive to light. Right eye exhibits no discharge. Left eye exhibits no discharge.  Neck: Normal range of motion. Neck supple. No JVD present. Carotid bruit is not present. No thyromegaly present.  Cardiovascular: Normal rate, regular rhythm, normal heart sounds and intact distal pulses.  Exam reveals no gallop.   Pulmonary/Chest: Effort normal and breath sounds normal. No respiratory distress. He has no wheezes.  Abdominal: Soft. Bowel sounds are normal. He exhibits no distension, no abdominal bruit and no mass. There is no tenderness.  Musculoskeletal: Normal range of motion. He exhibits no edema and no tenderness.  Lymphadenopathy:    He has no  cervical adenopathy.  Neurological: He is alert. He has normal reflexes. No cranial nerve deficit. He exhibits normal muscle tone. Coordination normal.  Skin: Skin is warm and dry. No rash  noted. No erythema. No pallor.       lentigos noted Few SKs on back  Pt states he sees derm regularly and wears a hat  Psychiatric: He has a normal mood and affect. His behavior is normal. Thought content normal.          Assessment & Plan:

## 2011-08-10 NOTE — Assessment & Plan Note (Signed)
Reviewed all labs today- all ok  Is on many supplements- all of which he thinks are helpful  Is important to watch these- esp in light of coumadin  INRs have been stable per pt

## 2011-08-10 NOTE — Assessment & Plan Note (Signed)
Did psa this year but not DRE due to adv age/ recommendations and lack of symptoms  Disc with pt  Will alert me if any urinary symptoms

## 2011-09-15 ENCOUNTER — Ambulatory Visit (INDEPENDENT_AMBULATORY_CARE_PROVIDER_SITE_OTHER): Payer: No Typology Code available for payment source

## 2011-09-15 DIAGNOSIS — I4892 Unspecified atrial flutter: Secondary | ICD-10-CM

## 2011-09-15 DIAGNOSIS — I4891 Unspecified atrial fibrillation: Secondary | ICD-10-CM

## 2011-09-15 DIAGNOSIS — I6350 Cerebral infarction due to unspecified occlusion or stenosis of unspecified cerebral artery: Secondary | ICD-10-CM

## 2011-09-15 DIAGNOSIS — Z7901 Long term (current) use of anticoagulants: Secondary | ICD-10-CM

## 2011-09-15 LAB — POCT INR: INR: 2.5

## 2011-10-18 ENCOUNTER — Encounter: Payer: Self-pay | Admitting: Cardiovascular Disease

## 2011-10-18 ENCOUNTER — Ambulatory Visit (INDEPENDENT_AMBULATORY_CARE_PROVIDER_SITE_OTHER): Payer: No Typology Code available for payment source | Admitting: Cardiovascular Disease

## 2011-10-18 VITALS — BP 122/80 | HR 108 | Ht 67.0 in | Wt 159.0 lb

## 2011-10-18 DIAGNOSIS — I4891 Unspecified atrial fibrillation: Secondary | ICD-10-CM

## 2011-10-18 DIAGNOSIS — I4892 Unspecified atrial flutter: Secondary | ICD-10-CM

## 2011-10-18 DIAGNOSIS — I6350 Cerebral infarction due to unspecified occlusion or stenosis of unspecified cerebral artery: Secondary | ICD-10-CM

## 2011-10-18 DIAGNOSIS — E785 Hyperlipidemia, unspecified: Secondary | ICD-10-CM

## 2011-10-18 DIAGNOSIS — Z7901 Long term (current) use of anticoagulants: Secondary | ICD-10-CM

## 2011-10-18 MED ORDER — METOPROLOL TARTRATE 25 MG PO TABS: 25.0000 mg | ORAL_TABLET | Freq: Two times a day (BID) | ORAL | Status: DC

## 2011-10-18 NOTE — Progress Notes (Signed)
Patient ID: David Duncan, male    DOB: 05/16/1930, 78 y.o.   MRN: [id number]  HPI Comments: 78 year old male with a history of bipolar disorder, presenting to [facility] on November 12 2009 with a stroke. Noted to have atrial fibrillation on TEE. He presents for routine followup.    he has been doing well. No complaints. He is active, does some exercises. Walks daily with his wife. No symptoms of tachycardia palpitations.  He has been checking his blood pressure and heart rate at home. Heart rate sometimes alternates between 100 and 140   Prior  CT of the brain and MRA showed stroke in the mid left posterior cerebral artery. TPA was given with improvement of his neurologic deficits within 20 minutes.  Carotid Doppler showed no stenosis.  2-D echocardiogram showed low normal systolic function with no source of embolus. A TEE was performed that showed paroxysmal atrial fibrillation with no source of embolus.   EKG today shows atrial fibrillation with ventricular rate 108 beats per minute      Outpatient Encounter Prescriptions as of 10/18/2011  Medication Sig Dispense Refill  . 5-Hydroxytryptophan 100 MG CAPS Take 2 capsules by mouth daily       . Ascorbic Acid (VITAMIN C) 500 MG tablet Take 500 mg by mouth 4 (four) times daily.        . B Complex Vitamins (B COMPLEX PO) Take by mouth 2 (two) times daily.       . Cholecalciferol (VITAMIN D-3) 5000 UNITS TABS Take 1 tablet by mouth 2 (two) times daily.      . clonazePAM (KLONOPIN) 0.5 MG tablet Take 0.5 mg by mouth daily as needed.       . Cyanocobalamin (VITAMIN B-12) 2500 MCG SUBL Take 2 tabs by mouth daily       . escitalopram (LEXAPRO) 20 MG tablet Take 20 mg by mouth daily as needed.       . fluticasone (FLONASE) 50 MCG/ACT nasal spray Place 2 sprays into the nose daily.  16 g  1  . Ginkgo Biloba (GINKOBA PO) Take by mouth 3 (three) times daily.        . Glucosamine-Chondroitin (GLUCOSAMINE CHONDR COMPLEX PO) Take by mouth 2  (two) times daily.        [name] Kitchen lamoTRIgine (LAMICTAL) 200 MG tablet Take 200 mg by mouth daily.        [name] Kitchen NADH Disodium POWD Use as directed       . niacin 100 MG tablet Take 100 mg by mouth 2 (two) times daily with a meal.       . NON FORMULARY Lithium Orotate 130mg -take twice daily       . Saw Palmetto 450 MG CAPS Take by mouth 4 (four) times daily.       . Taurine 500 MG CAPS Take by mouth 2 (two) times daily.        . Tyrosine 500 MG CAPS Take by mouth 2 (two) times daily.        [name] Kitchen warfarin (COUMADIN) 5 MG tablet Use as directed by anticoagulation clinic  60 tablet  3  . metoprolol tartrate (LOPRESSOR) 25 MG tablet Take 1 tablet (25 mg total) by mouth 2 (two) times daily.  60 tablet  11   Review of Systems  Constitutional: Negative.   HENT: Negative.   Eyes: Negative.   Respiratory: Negative.   Cardiovascular: Negative.   Gastrointestinal: Negative.   Musculoskeletal: Negative.   Skin: Negative.  Neurological: Negative.   Hematological: Negative.   Psychiatric/Behavioral: Negative.   All other systems reviewed and are negative.    BP 122/80  Pulse 108  Ht 5\' 7"  (1.702 m)  Wt 159 lb (72.122 kg)  BMI 24.90 kg/m2  Physical Exam  Nursing note and vitals reviewed. Constitutional: He is oriented to person, place, and time. He appears well-developed and well-nourished.  HENT:  Head: Normocephalic.  Nose: Nose normal.  Mouth/Throat: Oropharynx is clear and moist.  Eyes: Conjunctivae are normal. Pupils are equal, round, and reactive to light.  Neck: Normal range of motion. Neck supple. No JVD present.  Cardiovascular: S1 normal, S2 normal and intact distal pulses.  An irregularly irregular rhythm present. Tachycardia present.  Exam reveals no gallop and no friction rub.   Pulmonary/Chest: Effort normal and breath sounds normal. No respiratory distress. He has no wheezes. He has no rales. He exhibits no tenderness.  Abdominal: Soft. Bowel sounds are normal. He exhibits no  distension. There is no tenderness.  Musculoskeletal: Normal range of motion. He exhibits no edema and no tenderness.  Lymphadenopathy:    He has no cervical adenopathy.  Neurological: He is alert and oriented to person, place, and time. Coordination normal.  Skin: Skin is warm and dry. No rash noted. No erythema.  Psychiatric: He has a normal mood and affect. His behavior is normal. Judgment and thought content normal.           Assessment and Plan

## 2011-10-18 NOTE — Assessment & Plan Note (Signed)
History of CVA likely from paroxysmal atrial fibrillation. Currently on warfarin.

## 2011-10-18 NOTE — Patient Instructions (Addendum)
You are doing well. Please start metoprolol one pill twice a day  Please call us if you have new issues that need to be addressed before your next appt.  Your physician wants you to follow-up in: 3 months.  You will receive a reminder letter in the mail two months in advance. If you don't receive a letter, please call our office to schedule the follow-up appointment.

## 2011-10-18 NOTE — Assessment & Plan Note (Signed)
INR has been well-controlled in the mid 2 range

## 2011-10-18 NOTE — Assessment & Plan Note (Signed)
Atrial fibrillation with RVR today. He did not take his metoprolol as suggested on his last clinic visit. We have explained that he will likely need rate control medications such as metoprolol, possibly even an antiarrhythmic to maintain normal sinus rhythm. He is willing to start metoprolol. He did not tried in the past. We will start with 25 mg twice a day.

## 2011-10-18 NOTE — Assessment & Plan Note (Signed)
Cholesterol was mildly higher in April of 2013 compared to last year. We have encouraged he watch his diet and continue walking.

## 2011-10-27 ENCOUNTER — Ambulatory Visit (INDEPENDENT_AMBULATORY_CARE_PROVIDER_SITE_OTHER): Payer: No Typology Code available for payment source

## 2011-10-27 DIAGNOSIS — I4892 Unspecified atrial flutter: Secondary | ICD-10-CM

## 2011-10-27 DIAGNOSIS — I6350 Cerebral infarction due to unspecified occlusion or stenosis of unspecified cerebral artery: Secondary | ICD-10-CM

## 2011-10-27 DIAGNOSIS — I4891 Unspecified atrial fibrillation: Secondary | ICD-10-CM

## 2011-10-27 DIAGNOSIS — Z7901 Long term (current) use of anticoagulants: Secondary | ICD-10-CM

## 2011-11-10 ENCOUNTER — Ambulatory Visit (INDEPENDENT_AMBULATORY_CARE_PROVIDER_SITE_OTHER): Payer: No Typology Code available for payment source

## 2011-11-10 DIAGNOSIS — I4891 Unspecified atrial fibrillation: Secondary | ICD-10-CM

## 2011-11-10 DIAGNOSIS — I4892 Unspecified atrial flutter: Secondary | ICD-10-CM

## 2011-11-10 DIAGNOSIS — Z7901 Long term (current) use of anticoagulants: Secondary | ICD-10-CM

## 2011-11-10 DIAGNOSIS — I6350 Cerebral infarction due to unspecified occlusion or stenosis of unspecified cerebral artery: Secondary | ICD-10-CM

## 2011-12-01 ENCOUNTER — Ambulatory Visit (INDEPENDENT_AMBULATORY_CARE_PROVIDER_SITE_OTHER): Payer: No Typology Code available for payment source

## 2011-12-01 DIAGNOSIS — I6350 Cerebral infarction due to unspecified occlusion or stenosis of unspecified cerebral artery: Secondary | ICD-10-CM

## 2011-12-01 DIAGNOSIS — I4892 Unspecified atrial flutter: Secondary | ICD-10-CM

## 2011-12-01 DIAGNOSIS — Z7901 Long term (current) use of anticoagulants: Secondary | ICD-10-CM

## 2011-12-01 DIAGNOSIS — I4891 Unspecified atrial fibrillation: Secondary | ICD-10-CM

## 2011-12-16 ENCOUNTER — Telehealth: Payer: Self-pay

## 2011-12-16 NOTE — Telephone Encounter (Signed)
Pt request copy of all labs including all protimes for last year. Pt going to Dr Alessandra Bevels for Tlation(pt said had to do with vitamins)  Pt wants to pick up lab copies 12/17/11.Please advise.

## 2011-12-16 NOTE — Telephone Encounter (Signed)
Since he gets all his protimes and cardiac tx through cardiology -he needs to make this request to his cardiology office so they know what is doing Thanks

## 2011-12-16 NOTE — Telephone Encounter (Signed)
Left message on patient Vm with information provided below.

## 2011-12-22 ENCOUNTER — Ambulatory Visit (INDEPENDENT_AMBULATORY_CARE_PROVIDER_SITE_OTHER): Payer: No Typology Code available for payment source

## 2011-12-22 DIAGNOSIS — I4891 Unspecified atrial fibrillation: Secondary | ICD-10-CM

## 2011-12-22 DIAGNOSIS — Z7901 Long term (current) use of anticoagulants: Secondary | ICD-10-CM

## 2011-12-22 DIAGNOSIS — I4892 Unspecified atrial flutter: Secondary | ICD-10-CM

## 2011-12-22 DIAGNOSIS — I6350 Cerebral infarction due to unspecified occlusion or stenosis of unspecified cerebral artery: Secondary | ICD-10-CM

## 2012-01-18 ENCOUNTER — Ambulatory Visit (INDEPENDENT_AMBULATORY_CARE_PROVIDER_SITE_OTHER): Payer: No Typology Code available for payment source

## 2012-01-18 ENCOUNTER — Ambulatory Visit (INDEPENDENT_AMBULATORY_CARE_PROVIDER_SITE_OTHER): Payer: No Typology Code available for payment source | Admitting: Cardiovascular Disease

## 2012-01-18 ENCOUNTER — Encounter: Payer: Self-pay | Admitting: Cardiovascular Disease

## 2012-01-18 VITALS — BP 120/84 | HR 111 | Ht 67.0 in | Wt 156.0 lb

## 2012-01-18 DIAGNOSIS — I4891 Unspecified atrial fibrillation: Secondary | ICD-10-CM

## 2012-01-18 DIAGNOSIS — E785 Hyperlipidemia, unspecified: Secondary | ICD-10-CM

## 2012-01-18 DIAGNOSIS — I6350 Cerebral infarction due to unspecified occlusion or stenosis of unspecified cerebral artery: Secondary | ICD-10-CM

## 2012-01-18 DIAGNOSIS — I4892 Unspecified atrial flutter: Secondary | ICD-10-CM

## 2012-01-18 DIAGNOSIS — Z7901 Long term (current) use of anticoagulants: Secondary | ICD-10-CM

## 2012-01-18 NOTE — Assessment & Plan Note (Signed)
We'll check his INR today. We have encouraged him to stay on his warfarin.

## 2012-01-18 NOTE — Assessment & Plan Note (Signed)
He prefers to take over-the-counter medications. He is not interested in prescriptions.

## 2012-01-18 NOTE — Patient Instructions (Addendum)
You are doing well. No medication changes were made.  Please call us if you have new issues that need to be addressed before your next appt.  Your physician wants you to follow-up in: 6 months.  You will receive a reminder letter in the mail two months in advance. If you don't receive a letter, please call our office to schedule the follow-up appointment.   

## 2012-01-18 NOTE — Progress Notes (Signed)
Patient ID: David Duncan, male    DOB: 10/22/1930, 78 y.o.   MRN: [id number]  HPI Comments: 78 year old male with a history of bipolar disorder, presenting to [facility] on November 12 2009 with a stroke. Noted to have atrial fibrillation on TEE. He presents for routine followup. He is medication noncompliant.   He reports that he feels well. No complaints. He is active, does some exercises. Walks daily with his wife. No symptoms of tachycardia or  palpitations. He has been checking his blood pressure and heart rate at home. Heart rate sometimes alternates between 80 and 100. He does not check his heart rate with exertion. He is taking warfarin but no other cardiac medications. He did not like the way the metoprolol made him feel. He is not interested in any other rate controlling agents. He is not interested in cardioversion   Prior  CT of the brain and MRA showed stroke in the mid left posterior cerebral artery. TPA was given with improvement of his neurologic deficits within 20 minutes.  Carotid Doppler showed no stenosis.  2-D echocardiogram showed low normal systolic function with no source of embolus. A TEE was performed that showed paroxysmal atrial fibrillation with no source of embolus.   EKG today shows atrial fibrillation with ventricular rate 111 beats per minute      Outpatient Encounter Prescriptions as of 01/18/2012  Medication Sig Dispense Refill  . 5-Hydroxytryptophan 100 MG CAPS Take 2 capsules by mouth daily       . Ascorbic Acid (VITAMIN C) 500 MG tablet Take 500 mg by mouth 4 (four) times daily.        . B Complex Vitamins (B COMPLEX PO) Take by mouth 2 (two) times daily.       . Cholecalciferol (VITAMIN D-3) 5000 UNITS TABS Take 1 tablet by mouth 2 (two) times daily.      . clonazePAM (KLONOPIN) 0.5 MG tablet Take 0.5 mg by mouth daily as needed.       . Cyanocobalamin (VITAMIN B-12) 2500 MCG SUBL Take 2 tabs by mouth daily       . fluticasone (FLONASE) 50  MCG/ACT nasal spray Place 2 sprays into the nose daily.  16 g  1  . Ginkgo Biloba (GINKOBA PO) Take by mouth 3 (three) times daily.        . Glucosamine-Chondroitin (GLUCOSAMINE CHONDR COMPLEX PO) Take by mouth 2 (two) times daily.        [name] Kitchen lamoTRIgine (LAMICTAL) 200 MG tablet Take 200 mg by mouth daily.        [name] Kitchen NADH Disodium POWD Use as directed       . niacin 100 MG tablet Take 100 mg by mouth 2 (two) times daily with a meal.       . NON FORMULARY Lithium Orotate 130mg -take twice daily       . Saw Palmetto 450 MG CAPS Take by mouth 4 (four) times daily.       . Taurine 500 MG CAPS Take by mouth 2 (two) times daily.        . Tyrosine 500 MG CAPS Take by mouth 2 (two) times daily.        [name] Kitchen warfarin (COUMADIN) 5 MG tablet Use as directed by anticoagulation clinic  60 tablet  3  . metoprolol tartrate (LOPRESSOR) 25 MG tablet Take 1 tablet (25 mg total) by mouth 2 (two) times daily.  60 tablet  11  . DISCONTD: escitalopram (LEXAPRO) 20  MG tablet Take 20 mg by mouth daily as needed.         Review of Systems  Constitutional: Negative.   HENT: Negative.   Eyes: Negative.   Respiratory: Negative.   Cardiovascular: Negative.   Gastrointestinal: Negative.   Musculoskeletal: Negative.   Skin: Negative.   Neurological: Negative.   Hematological: Negative.   Psychiatric/Behavioral: Negative.   All other systems reviewed and are negative.    BP 120/84  Pulse 111  Ht 5\' 7"  (1.702 m)  Wt 156 lb (70.761 kg)  BMI 24.43 kg/m2  Physical Exam  Nursing note and vitals reviewed. Constitutional: He is oriented to person, place, and time. He appears well-developed and well-nourished.  HENT:  Head: Normocephalic.  Nose: Nose normal.  Mouth/Throat: Oropharynx is clear and moist.  Eyes: Conjunctivae normal are normal. Pupils are equal, round, and reactive to light.  Neck: Normal range of motion. Neck supple. No JVD present.  Cardiovascular: S1 normal, S2 normal, normal heart sounds and intact  distal pulses.  An irregularly irregular rhythm present. Tachycardia present.  Exam reveals no gallop and no friction rub.   No murmur heard. Pulmonary/Chest: Effort normal and breath sounds normal. No respiratory distress. He has no wheezes. He has no rales. He exhibits no tenderness.  Abdominal: Soft. Bowel sounds are normal. He exhibits no distension. There is no tenderness.  Musculoskeletal: Normal range of motion. He exhibits no edema and no tenderness.  Lymphadenopathy:    He has no cervical adenopathy.  Neurological: He is alert and oriented to person, place, and time. Coordination normal.  Skin: Skin is warm and dry. No rash noted. No erythema.  Psychiatric: He has a normal mood and affect. His behavior is normal. Judgment and thought content normal.           Assessment and Plan

## 2012-01-18 NOTE — Assessment & Plan Note (Signed)
Heart rate is elevated today. He is not interested in starting any new medications. He is relatively asymptomatic. We have encouraged him to stay on his warfarin and call our office for edema, shortness of breath or worsening palpitations. We did offer medical management and cardioversion. He did not want either.

## 2012-01-20 ENCOUNTER — Encounter: Payer: Self-pay | Admitting: Internal Medicine

## 2012-01-20 ENCOUNTER — Ambulatory Visit (INDEPENDENT_AMBULATORY_CARE_PROVIDER_SITE_OTHER): Payer: No Typology Code available for payment source | Admitting: Internal Medicine

## 2012-01-20 VITALS — BP 120/80 | HR 72 | Temp 98.0°F | Wt 154.0 lb

## 2012-01-20 DIAGNOSIS — R21 Rash and other nonspecific skin eruption: Secondary | ICD-10-CM | POA: Insufficient documentation

## 2012-01-20 MED ORDER — TRIAMCINOLONE ACETONIDE 0.1 % EX LOTN: TOPICAL_LOTION | Freq: Two times a day (BID) | CUTANEOUS | Status: DC | PRN

## 2012-01-20 MED ORDER — SELENIUM SULFIDE 2.5 % EX LOTN: TOPICAL_LOTION | CUTANEOUS | Status: DC

## 2012-01-20 NOTE — Progress Notes (Signed)
Subjective:    Patient ID: David Duncan, male    DOB: [birth year], 78 y.o.   MRN: [id number]  HPI Has a rash on his chest Started ~3 months ago Tried baby powder---may have helped a little OTC hydrocortisone cream also helped some Some itching No flaking  No new meds, soaps, etc  Current Outpatient Prescriptions on File Prior to Visit  Medication Sig Dispense Refill  . 5-Hydroxytryptophan 100 MG CAPS Take 2 capsules by mouth daily       . Ascorbic Acid (VITAMIN C) 500 MG tablet Take 500 mg by mouth 4 (four) times daily.        . B Complex Vitamins (B COMPLEX PO) Take by mouth 2 (two) times daily.       . Cholecalciferol (VITAMIN D-3) 5000 UNITS TABS Take 1 tablet by mouth 2 (two) times daily.      . clonazePAM (KLONOPIN) 0.5 MG tablet Take 0.5 mg by mouth daily as needed.       . Cyanocobalamin (VITAMIN B-12) 2500 MCG SUBL Take 2 tabs by mouth daily       . fluticasone (FLONASE) 50 MCG/ACT nasal spray Place 2 sprays into the nose daily.  16 g  1  . Ginkgo Biloba (GINKOBA PO) Take by mouth 3 (three) times daily.        . Glucosamine-Chondroitin (GLUCOSAMINE CHONDR COMPLEX PO) Take by mouth 2 (two) times daily.        [name] Kitchen lamoTRIgine (LAMICTAL) 200 MG tablet Take 200 mg by mouth daily.        . metoprolol tartrate (LOPRESSOR) 25 MG tablet Take 1 tablet (25 mg total) by mouth 2 (two) times daily.  60 tablet  11  . NADH Disodium POWD Use as directed       . niacin 100 MG tablet Take 100 mg by mouth 2 (two) times daily with a meal.       . NON FORMULARY Lithium Orotate 130mg -take twice daily       . Saw Palmetto 450 MG CAPS Take by mouth 4 (four) times daily.       . Taurine 500 MG CAPS Take by mouth 2 (two) times daily.        . Tyrosine 500 MG CAPS Take by mouth 2 (two) times daily.        [name] Kitchen warfarin (COUMADIN) 5 MG tablet Use as directed by anticoagulation clinic  60 tablet  3    No Known Allergies  Past Medical History  Diagnosis Date  . GERD (gastroesophageal reflux disease)    . Osteoarthritis     in neck and left knee  . Bipolar disorder     hospitalized in past  . History of kidney stones     30 years ago  . Basal cell cancer 2008    on shoulder  . Macular degeneration   . Atrial fibrillation   . CVA (cerebral vascular accident) 8/11    Past Surgical History  Procedure Date  . Back surgery 1991    Family History  Problem Relation Age of Onset  . Stroke Father   . Cancer Brother     prostate  . Coronary artery disease Brother   . Hypertension Brother     History   Social History  . Marital Status: Married    Spouse Name: N/A    Number of Children: N/A  . Years of Education: N/A   Occupational History  . retired     used to  work on farm, Airline pilot, Journalist, newspaper   Social History Main Topics  . Smoking status: Never Smoker   . Smokeless tobacco: Not on file  . Alcohol Use: No  . Drug Use: No  . Sexually Active: Not on file   Other Topics Concern  . Not on file   Social History Narrative   Moved from [place] 3/10Walks 30-40 minutes daily   Review of Systems Felt well No fevers Appetite     Objective:   Physical Exam  Constitutional: He appears well-developed and well-nourished. No distress.  Skin:       Macular pink discoloration in upper chest with some flat pink papules between breasts No flaking when scratched          Assessment & Plan:

## 2012-01-20 NOTE — Assessment & Plan Note (Signed)
Just on upper chest While somewhat atypical appearance, the location and distribution suggests tinea versicolor No bacterial or viral infection apparent  Will try selsun  Triamcinolone for the itching

## 2012-01-26 ENCOUNTER — Ambulatory Visit (INDEPENDENT_AMBULATORY_CARE_PROVIDER_SITE_OTHER): Payer: No Typology Code available for payment source

## 2012-01-26 DIAGNOSIS — Z7901 Long term (current) use of anticoagulants: Secondary | ICD-10-CM

## 2012-01-26 DIAGNOSIS — I4891 Unspecified atrial fibrillation: Secondary | ICD-10-CM

## 2012-01-26 DIAGNOSIS — I6350 Cerebral infarction due to unspecified occlusion or stenosis of unspecified cerebral artery: Secondary | ICD-10-CM

## 2012-01-26 DIAGNOSIS — I4892 Unspecified atrial flutter: Secondary | ICD-10-CM

## 2012-02-09 ENCOUNTER — Ambulatory Visit (INDEPENDENT_AMBULATORY_CARE_PROVIDER_SITE_OTHER): Payer: No Typology Code available for payment source

## 2012-02-09 DIAGNOSIS — I4892 Unspecified atrial flutter: Secondary | ICD-10-CM

## 2012-02-09 DIAGNOSIS — I4891 Unspecified atrial fibrillation: Secondary | ICD-10-CM

## 2012-02-09 DIAGNOSIS — Z7901 Long term (current) use of anticoagulants: Secondary | ICD-10-CM

## 2012-02-09 DIAGNOSIS — I6350 Cerebral infarction due to unspecified occlusion or stenosis of unspecified cerebral artery: Secondary | ICD-10-CM

## 2012-02-09 LAB — POCT INR: INR: 1.8

## 2012-02-23 ENCOUNTER — Ambulatory Visit (INDEPENDENT_AMBULATORY_CARE_PROVIDER_SITE_OTHER): Payer: No Typology Code available for payment source

## 2012-02-23 DIAGNOSIS — I4891 Unspecified atrial fibrillation: Secondary | ICD-10-CM

## 2012-02-23 DIAGNOSIS — I4892 Unspecified atrial flutter: Secondary | ICD-10-CM

## 2012-02-23 DIAGNOSIS — I6350 Cerebral infarction due to unspecified occlusion or stenosis of unspecified cerebral artery: Secondary | ICD-10-CM

## 2012-02-23 DIAGNOSIS — Z7901 Long term (current) use of anticoagulants: Secondary | ICD-10-CM

## 2012-02-23 LAB — POCT INR: INR: 2.4

## 2012-03-15 ENCOUNTER — Ambulatory Visit (INDEPENDENT_AMBULATORY_CARE_PROVIDER_SITE_OTHER): Payer: No Typology Code available for payment source

## 2012-03-15 DIAGNOSIS — I4892 Unspecified atrial flutter: Secondary | ICD-10-CM

## 2012-03-15 DIAGNOSIS — Z7901 Long term (current) use of anticoagulants: Secondary | ICD-10-CM

## 2012-03-15 DIAGNOSIS — I6350 Cerebral infarction due to unspecified occlusion or stenosis of unspecified cerebral artery: Secondary | ICD-10-CM

## 2012-03-15 DIAGNOSIS — I4891 Unspecified atrial fibrillation: Secondary | ICD-10-CM

## 2012-03-15 LAB — POCT INR: INR: 1.7

## 2012-03-29 ENCOUNTER — Ambulatory Visit (INDEPENDENT_AMBULATORY_CARE_PROVIDER_SITE_OTHER): Payer: No Typology Code available for payment source

## 2012-03-29 DIAGNOSIS — I4891 Unspecified atrial fibrillation: Secondary | ICD-10-CM

## 2012-03-29 DIAGNOSIS — I4892 Unspecified atrial flutter: Secondary | ICD-10-CM

## 2012-03-29 DIAGNOSIS — Z7901 Long term (current) use of anticoagulants: Secondary | ICD-10-CM

## 2012-03-29 DIAGNOSIS — I6350 Cerebral infarction due to unspecified occlusion or stenosis of unspecified cerebral artery: Secondary | ICD-10-CM

## 2012-03-29 LAB — POCT INR: INR: 2.6

## 2012-04-16 ENCOUNTER — Telehealth: Payer: Self-pay | Admitting: Family Medicine

## 2012-04-16 DIAGNOSIS — Z125 Encounter for screening for malignant neoplasm of prostate: Secondary | ICD-10-CM

## 2012-04-16 DIAGNOSIS — I6350 Cerebral infarction due to unspecified occlusion or stenosis of unspecified cerebral artery: Secondary | ICD-10-CM

## 2012-04-16 DIAGNOSIS — I4891 Unspecified atrial fibrillation: Secondary | ICD-10-CM

## 2012-04-16 DIAGNOSIS — T50905A Adverse effect of unspecified drugs, medicaments and biological substances, initial encounter: Secondary | ICD-10-CM

## 2012-04-16 DIAGNOSIS — E785 Hyperlipidemia, unspecified: Secondary | ICD-10-CM

## 2012-04-16 NOTE — Telephone Encounter (Signed)
Message copied by Judy Pimple on Sun Apr 16, 2012  2:15 PM ------      Message from: Baldomero Lamy      Created: Tue Apr 11, 2012  3:01 PM      Regarding: Cpx labs Mon  04/15/12       Please order  future cpx labs for pt's upcoming lab appt.      Thanks      Rodney Booze

## 2012-04-17 ENCOUNTER — Other Ambulatory Visit: Payer: No Typology Code available for payment source

## 2012-04-21 ENCOUNTER — Encounter: Payer: No Typology Code available for payment source | Admitting: Family Medicine

## 2012-04-26 ENCOUNTER — Ambulatory Visit (INDEPENDENT_AMBULATORY_CARE_PROVIDER_SITE_OTHER): Payer: Medicare PPO

## 2012-04-26 DIAGNOSIS — I4892 Unspecified atrial flutter: Secondary | ICD-10-CM

## 2012-04-26 DIAGNOSIS — I4891 Unspecified atrial fibrillation: Secondary | ICD-10-CM

## 2012-04-26 DIAGNOSIS — Z7901 Long term (current) use of anticoagulants: Secondary | ICD-10-CM

## 2012-04-26 DIAGNOSIS — I6350 Cerebral infarction due to unspecified occlusion or stenosis of unspecified cerebral artery: Secondary | ICD-10-CM

## 2012-04-26 LAB — POCT INR: INR: 3.4

## 2012-04-26 MED ORDER — WARFARIN SODIUM 5 MG PO TABS: ORAL_TABLET | ORAL | Status: DC

## 2012-05-17 ENCOUNTER — Ambulatory Visit (INDEPENDENT_AMBULATORY_CARE_PROVIDER_SITE_OTHER): Payer: Medicare PPO

## 2012-05-17 DIAGNOSIS — I4892 Unspecified atrial flutter: Secondary | ICD-10-CM

## 2012-05-17 DIAGNOSIS — I4891 Unspecified atrial fibrillation: Secondary | ICD-10-CM

## 2012-05-17 DIAGNOSIS — I6350 Cerebral infarction due to unspecified occlusion or stenosis of unspecified cerebral artery: Secondary | ICD-10-CM

## 2012-05-17 DIAGNOSIS — Z7901 Long term (current) use of anticoagulants: Secondary | ICD-10-CM

## 2012-05-31 ENCOUNTER — Ambulatory Visit (INDEPENDENT_AMBULATORY_CARE_PROVIDER_SITE_OTHER): Payer: Medicare PPO

## 2012-05-31 DIAGNOSIS — I4891 Unspecified atrial fibrillation: Secondary | ICD-10-CM

## 2012-05-31 DIAGNOSIS — Z7901 Long term (current) use of anticoagulants: Secondary | ICD-10-CM

## 2012-05-31 DIAGNOSIS — I4892 Unspecified atrial flutter: Secondary | ICD-10-CM

## 2012-05-31 DIAGNOSIS — I6350 Cerebral infarction due to unspecified occlusion or stenosis of unspecified cerebral artery: Secondary | ICD-10-CM

## 2012-05-31 MED ORDER — WARFARIN SODIUM 5 MG PO TABS: ORAL_TABLET | ORAL | Status: DC

## 2012-06-14 ENCOUNTER — Ambulatory Visit (INDEPENDENT_AMBULATORY_CARE_PROVIDER_SITE_OTHER): Payer: Medicare PPO

## 2012-06-14 DIAGNOSIS — I4891 Unspecified atrial fibrillation: Secondary | ICD-10-CM

## 2012-06-14 LAB — POCT INR: INR: 2.6

## 2012-07-05 ENCOUNTER — Ambulatory Visit (INDEPENDENT_AMBULATORY_CARE_PROVIDER_SITE_OTHER): Payer: Medicare PPO

## 2012-07-05 DIAGNOSIS — I6350 Cerebral infarction due to unspecified occlusion or stenosis of unspecified cerebral artery: Secondary | ICD-10-CM

## 2012-07-05 DIAGNOSIS — Z7901 Long term (current) use of anticoagulants: Secondary | ICD-10-CM

## 2012-07-05 DIAGNOSIS — I4891 Unspecified atrial fibrillation: Secondary | ICD-10-CM

## 2012-07-05 DIAGNOSIS — I4892 Unspecified atrial flutter: Secondary | ICD-10-CM

## 2012-07-19 ENCOUNTER — Ambulatory Visit (INDEPENDENT_AMBULATORY_CARE_PROVIDER_SITE_OTHER): Payer: Medicare PPO

## 2012-07-19 DIAGNOSIS — Z7901 Long term (current) use of anticoagulants: Secondary | ICD-10-CM

## 2012-07-19 DIAGNOSIS — I4891 Unspecified atrial fibrillation: Secondary | ICD-10-CM

## 2012-07-19 DIAGNOSIS — I6350 Cerebral infarction due to unspecified occlusion or stenosis of unspecified cerebral artery: Secondary | ICD-10-CM

## 2012-07-19 DIAGNOSIS — I4892 Unspecified atrial flutter: Secondary | ICD-10-CM

## 2012-08-02 ENCOUNTER — Other Ambulatory Visit (INDEPENDENT_AMBULATORY_CARE_PROVIDER_SITE_OTHER): Payer: Medicare PPO

## 2012-08-02 DIAGNOSIS — E785 Hyperlipidemia, unspecified: Secondary | ICD-10-CM

## 2012-08-02 DIAGNOSIS — I4891 Unspecified atrial fibrillation: Secondary | ICD-10-CM

## 2012-08-02 DIAGNOSIS — T887XXA Unspecified adverse effect of drug or medicament, initial encounter: Secondary | ICD-10-CM

## 2012-08-02 DIAGNOSIS — I6350 Cerebral infarction due to unspecified occlusion or stenosis of unspecified cerebral artery: Secondary | ICD-10-CM

## 2012-08-02 DIAGNOSIS — T50905A Adverse effect of unspecified drugs, medicaments and biological substances, initial encounter: Secondary | ICD-10-CM

## 2012-08-02 LAB — COMPREHENSIVE METABOLIC PANEL
ALT: 28 U/L (ref 0–53)
AST: 31 U/L (ref 0–37)
Alkaline Phosphatase: 50 U/L (ref 39–117)
Sodium: 138 mEq/L (ref 135–145)
Total Bilirubin: 1.3 mg/dL — ABNORMAL HIGH (ref 0.3–1.2)
Total Protein: 7.4 g/dL (ref 6.0–8.3)

## 2012-08-02 LAB — CBC WITH DIFFERENTIAL/PLATELET
Basophils Absolute: 0 10*3/uL (ref 0.0–0.1)
Eosinophils Absolute: 0.3 10*3/uL (ref 0.0–0.7)
HCT: 45.8 % (ref 39.0–52.0)
Lymphs Abs: 2.3 10*3/uL (ref 0.7–4.0)
MCV: 100.6 fl — ABNORMAL HIGH (ref 78.0–100.0)
Monocytes Absolute: 0.6 10*3/uL (ref 0.1–1.0)
Neutrophils Relative %: 53.4 % (ref 43.0–77.0)
Platelets: 134 10*3/uL — ABNORMAL LOW (ref 150.0–400.0)
RDW: 14.8 % — ABNORMAL HIGH (ref 11.5–14.6)

## 2012-08-02 LAB — LIPID PANEL
Cholesterol: 152 mg/dL (ref 0–200)
LDL Cholesterol: 97 mg/dL (ref 0–99)
Total CHOL/HDL Ratio: 4
VLDL: 12.2 mg/dL (ref 0.0–40.0)

## 2012-08-09 ENCOUNTER — Other Ambulatory Visit: Payer: No Typology Code available for payment source

## 2012-08-16 ENCOUNTER — Ambulatory Visit (INDEPENDENT_AMBULATORY_CARE_PROVIDER_SITE_OTHER): Payer: Medicare PPO | Admitting: Family Medicine

## 2012-08-16 ENCOUNTER — Encounter: Payer: Self-pay | Admitting: Family Medicine

## 2012-08-16 VITALS — BP 130/86 | HR 101 | Temp 97.4°F | Ht 66.25 in | Wt 156.2 lb

## 2012-08-16 DIAGNOSIS — Z125 Encounter for screening for malignant neoplasm of prostate: Secondary | ICD-10-CM

## 2012-08-16 DIAGNOSIS — Z0000 Encounter for general adult medical examination without abnormal findings: Secondary | ICD-10-CM | POA: Insufficient documentation

## 2012-08-16 DIAGNOSIS — E785 Hyperlipidemia, unspecified: Secondary | ICD-10-CM

## 2012-08-16 NOTE — Patient Instructions (Addendum)
Take care of yourself !- and stay active  Labs look pretty good  If you change your mind about prostate cancer screening at any time please let me know

## 2012-08-16 NOTE — Progress Notes (Signed)
Subjective:    Patient ID: David Duncan, male    DOB: 18-Sep-1930, 79 y.o.   MRN: [id number]  HPI I have personally reviewed the Medicare Annual Wellness questionnaire and have noted 1. The patient's medical and social history 2. Their use of alcohol, tobacco or illicit drugs 3. Their current medications and supplements 4. The patient's functional ability including ADL's, fall risks, home safety risks and hearing or visual             impairment. 5. Diet and physical activities 6. Evidence for depression or mood disorders  Overall pt reports he is feeling good and having fun  He is taking chelation therapy with Dr Alessandra Bevels- 30 treatments    The patients weight, height, BMI have been recorded in the chart and visual acuity is per eye clinic.  I have made referrals, counseling and provided education to the patient based review of the above and I have provided the pt with a written personalized care plan for preventive services.  See scanned forms.  Routine anticipatory guidance given to patient.  See health maintenance. Flu shot declined Shingles vaccine - got it 6 years ago  PNA imm was about 6 years ago  Tetanus imm 1/05  Colonoscopy 07-was normal  Prostate cancer screening  Lab Results  Component Value Date   PSA 0.57 07/13/2011   PSA 0.43 07/31/2010   brother had prostate cancer  Takes saw palmetto - and that helps prevent prostate symptoms  - nocturia is 0-1 time  He is not interested in PSA or exam   Advance directive--has a living will and POA  Cognitive function addressed- see scanned forms- and if abnormal then additional documentation follows.  He is not worried about concentration or memory- it is much better off the lamictal he was on   Falls-none at all , he is careful   Mood- has been doing very well/ mood is good and he has not had trouble with bipolar - he takes supplements instead of px meds , and he reads a lot on the topic   Hx of hyperlipidemia He  declines medication Lab Results  Component Value Date   CHOL 152 08/02/2012   CHOL 176 07/13/2011   CHOL 160 11/25/2010   Lab Results  Component Value Date   HDL 42.60 08/02/2012   HDL 40.98 07/13/2011   HDL 58 11/25/2010   Lab Results  Component Value Date   LDLCALC 97 08/02/2012   LDLCALC 117* 07/13/2011   LDLCALC 90 11/25/2010   Lab Results  Component Value Date   TRIG 61.0 08/02/2012   TRIG 42.0 07/13/2011   TRIG 59 11/25/2010   Lab Results  Component Value Date   CHOLHDL 4 08/02/2012   CHOLHDL 3 07/13/2011   CHOLHDL 2.8 11/25/2010   No results found for this basename: LDLDIRECT   he takes niacin and some supplements for this in the past     PMH and SH reviewed  Meds, vitals, and allergies reviewed.   ROS: See HPI.  Otherwise negative.    Eats a very healthy diet and exercises regularly  Patient Active Problem List   Diagnosis Date Noted  . Encounter for Medicare annual wellness exam 08/16/2012  . Neck pain 02/19/2011  . Hearing decreased 12/16/2010  . Medication adverse effect 07/28/2010  . Prostate cancer screening 07/28/2010  . Long term (current) use of anticoagulants 07/22/2010  . HYPERLIPIDEMIA-MIXED 06/01/2010  . ATRIAL FIBRILLATION 11/28/2009  . ATRIAL FLUTTER 11/28/2009  . CVA 11/28/2009  .  BIPOLAR AFFECTIVE DISORDER 02/20/2009   Past Medical History  Diagnosis Date  . GERD (gastroesophageal reflux disease)   . Osteoarthritis     in neck and left knee  . Bipolar disorder     hospitalized in past  . History of kidney stones     30 years ago  . Basal cell cancer 2008    on shoulder  . Macular degeneration   . Atrial fibrillation   . CVA (cerebral vascular accident) 8/11   Past Surgical History  Procedure Laterality Date  . Back surgery  1991   History  Substance Use Topics  . Smoking status: Never Smoker   . Smokeless tobacco: Not on file  . Alcohol Use: No   Family History  Problem Relation Age of Onset  . Stroke Father   . Cancer Brother      prostate  . Coronary artery disease Brother   . Hypertension Brother    No Known Allergies Current Outpatient Prescriptions on File Prior to Visit  Medication Sig Dispense Refill  . 5-Hydroxytryptophan 100 MG CAPS Take 2 capsules by mouth daily       . Ascorbic Acid (VITAMIN C) 500 MG tablet Take 500 mg by mouth 4 (four) times daily.        . B Complex Vitamins (B COMPLEX PO) Take by mouth 2 (two) times daily.       . Cholecalciferol (VITAMIN D-3) 5000 UNITS TABS Take 1 tablet by mouth 2 (two) times daily.      . Cyanocobalamin (VITAMIN B-12) 2500 MCG SUBL Take 2 tabs by mouth daily       . Ginkgo Biloba (GINKOBA PO) Take by mouth 3 (three) times daily.        . Glucosamine-Chondroitin (GLUCOSAMINE CHONDR COMPLEX PO) Take by mouth 2 (two) times daily.        . NON FORMULARY Lithium Orotate 130mg -take twice daily       . Saw Palmetto 450 MG CAPS Take by mouth 4 (four) times daily.       [name] Kitchen selenium sulfide (SELSUN) 2.5 % shampoo Apply topically once a week. Apply to rash on chest and leave on overnight. Wash off in the morning  118 mL  3  . Taurine 500 MG CAPS Take by mouth 2 (two) times daily.        [name] Kitchen triamcinolone lotion (KENALOG) 0.1 % Apply topically 2 (two) times daily as needed. For itchy rash  60 mL  1  . Tyrosine 500 MG CAPS Take by mouth 2 (two) times daily.        [name] Kitchen warfarin (COUMADIN) 5 MG tablet Use as directed by anticoagulation clinic  90 tablet  1  . fluticasone (FLONASE) 50 MCG/ACT nasal spray Place 2 sprays into the nose daily.  16 g  1   No current facility-administered medications on file prior to visit.    Review of Systems Review of Systems  Constitutional: Negative for fever, appetite change, fatigue and unexpected weight change.  Eyes: Negative for pain and visual disturbance.  Respiratory: Negative for cough and shortness of breath.   Cardiovascular: Negative for cp or palpitations    Gastrointestinal: Negative for nausea, diarrhea and constipation.   Genitourinary: Negative for urgency and frequency.  Skin: Negative for pallor or rash   Neurological: Negative for weakness, light-headedness, numbness and headaches.  Hematological: Negative for adenopathy. Does not bruise/bleed easily.  Psychiatric/Behavioral: Negative for dysphoric mood. The patient is not nervous/anxious.  Objective:   Physical Exam  Constitutional: He appears well-developed and well-nourished. No distress.  HENT:  Head: Normocephalic and atraumatic.  Right Ear: External ear normal.  Left Ear: External ear normal.  Nose: Nose normal.  Mouth/Throat: Oropharynx is clear and moist.  Eyes: Conjunctivae and EOM are normal. Pupils are equal, round, and reactive to light. Right eye exhibits no discharge. Left eye exhibits no discharge. No scleral icterus.  Neck: Normal range of motion. Neck supple. No JVD present. Carotid bruit is not present. No thyromegaly present.  Cardiovascular: Normal rate, normal heart sounds and intact distal pulses.  Exam reveals no gallop.   Irregularly irreg rhythm baseline  Pulmonary/Chest: Effort normal and breath sounds normal. No respiratory distress. He has no wheezes.  Abdominal: Soft. Bowel sounds are normal. He exhibits no distension, no abdominal bruit and no mass. There is no tenderness.  Musculoskeletal: Normal range of motion. He exhibits no edema and no tenderness.  Lymphadenopathy:    He has no cervical adenopathy.  Neurological: He is alert. He has normal reflexes. No cranial nerve deficit. He exhibits normal muscle tone. Coordination normal.  Skin: Skin is dry. No rash noted. No erythema. No pallor.  sks diffusely  Psychiatric: He has a normal mood and affect. His mood appears not anxious. His affect is not blunt, not labile and not inappropriate. He does not exhibit a depressed mood.          Assessment & Plan:

## 2012-08-16 NOTE — Assessment & Plan Note (Signed)
Reviewed health habits including diet and exercise and skin cancer prevention Also reviewed health mt list, fam hx and immunizations  See HPI Commended on self care  Pt elects not to do prostate screening at his age

## 2012-08-16 NOTE — Assessment & Plan Note (Signed)
Overall profile is fair - disc goals with hx of cardiac issues and cva in past  He takes supplements and declines statins Disc goals for lipids and reasons to control them Rev labs with pt Rev low sat fat diet in detail

## 2012-08-16 NOTE — Assessment & Plan Note (Signed)
Has fam hx  Prostate cancer screening and PSA options (with potential risks and benefits of testing vs not testing) were discussed along with recent recs/guidelines.  He declined testing PSA at this point (as well as exam)- he will be on the look out for symptoms however

## 2012-09-06 ENCOUNTER — Ambulatory Visit: Payer: Self-pay | Admitting: Cardiovascular Disease

## 2012-09-06 DIAGNOSIS — I4892 Unspecified atrial flutter: Secondary | ICD-10-CM

## 2012-09-06 DIAGNOSIS — Z7901 Long term (current) use of anticoagulants: Secondary | ICD-10-CM

## 2012-09-06 DIAGNOSIS — I4891 Unspecified atrial fibrillation: Secondary | ICD-10-CM

## 2012-09-06 DIAGNOSIS — I6350 Cerebral infarction due to unspecified occlusion or stenosis of unspecified cerebral artery: Secondary | ICD-10-CM

## 2012-10-16 ENCOUNTER — Telehealth: Payer: Self-pay

## 2012-10-16 DIAGNOSIS — H3530 Unspecified macular degeneration: Secondary | ICD-10-CM | POA: Insufficient documentation

## 2012-10-16 NOTE — Telephone Encounter (Signed)
Pt needs referral to eye doctor for macular degeneration; pt has appt at [facility] on [address] 10/17/12. But needs referral.Please advise.

## 2012-10-16 NOTE — Telephone Encounter (Signed)
Ref done  

## 2013-02-15 ENCOUNTER — Other Ambulatory Visit: Payer: Self-pay

## 2013-05-30 ENCOUNTER — Telehealth: Payer: Self-pay

## 2013-05-30 NOTE — Telephone Encounter (Signed)
Pt left v/m requesting referral; that was entire message. Left v/m requesting cb.

## 2013-05-31 NOTE — Telephone Encounter (Signed)
Pt is on [employer] ins now and needs [employer] referral. Form filled out and given to pt care coordinator.

## 2013-08-12 ENCOUNTER — Telehealth: Payer: Self-pay | Admitting: Family Medicine

## 2013-08-12 DIAGNOSIS — T50905A Adverse effect of unspecified drugs, medicaments and biological substances, initial encounter: Secondary | ICD-10-CM

## 2013-08-12 DIAGNOSIS — Z0000 Encounter for general adult medical examination without abnormal findings: Secondary | ICD-10-CM

## 2013-08-12 DIAGNOSIS — E785 Hyperlipidemia, unspecified: Secondary | ICD-10-CM

## 2013-08-12 DIAGNOSIS — I4891 Unspecified atrial fibrillation: Secondary | ICD-10-CM

## 2013-08-12 NOTE — Telephone Encounter (Signed)
Message copied by Abner Greenspan on Sun Aug 12, 2013  6:28 PM ------      Message from: Ellamae Sia      Created: Thu Aug 02, 2013  9:44 AM      Regarding: Lab orders for Monday,5.4.15       Patient is scheduled for CPX labs, please order future labs, Thanks , Terri       ------

## 2013-08-13 ENCOUNTER — Other Ambulatory Visit (INDEPENDENT_AMBULATORY_CARE_PROVIDER_SITE_OTHER): Payer: Commercial Managed Care - HMO

## 2013-08-13 DIAGNOSIS — I4891 Unspecified atrial fibrillation: Secondary | ICD-10-CM

## 2013-08-13 DIAGNOSIS — Z0000 Encounter for general adult medical examination without abnormal findings: Secondary | ICD-10-CM

## 2013-08-13 DIAGNOSIS — E785 Hyperlipidemia, unspecified: Secondary | ICD-10-CM

## 2013-08-13 LAB — LIPID PANEL
Cholesterol: 197 mg/dL (ref 0–200)
HDL: 56.4 mg/dL (ref >39.00)
LDL Cholesterol: 127 mg/dL — ABNORMAL HIGH (ref 0–99)
Total CHOL/HDL Ratio: 3
Triglycerides: 66 mg/dL (ref 0.0–149.0)
VLDL: 13.2 mg/dL (ref 0.0–40.0)

## 2013-08-13 LAB — COMPREHENSIVE METABOLIC PANEL
ALT: 18 U/L (ref 0–53)
AST: 26 U/L (ref 0–37)
Albumin: 3.7 g/dL (ref 3.5–5.2)
Alkaline Phosphatase: 49 U/L (ref 39–117)
BILIRUBIN TOTAL: 0.8 mg/dL (ref 0.3–1.2)
BUN: 19 mg/dL (ref 6–23)
CHLORIDE: 105 meq/L (ref 96–112)
CO2: 27 meq/L (ref 19–32)
Calcium: 9.2 mg/dL (ref 8.4–10.5)
Creatinine, Ser: 1.2 mg/dL (ref 0.4–1.5)
GFR: 61.18 mL/min (ref >60.00)
Glucose, Bld: 105 mg/dL — ABNORMAL HIGH (ref 70–99)
Potassium: 3.8 mEq/L (ref 3.5–5.1)
SODIUM: 138 meq/L (ref 135–145)
TOTAL PROTEIN: 7.2 g/dL (ref 6.0–8.3)

## 2013-08-13 LAB — CBC WITH DIFFERENTIAL/PLATELET
BASOS ABS: 0 10*3/uL (ref 0.0–0.1)
Basophils Relative: 0.7 % (ref 0.0–3.0)
EOS ABS: 0.2 10*3/uL (ref 0.0–0.7)
Eosinophils Relative: 3.7 % (ref 0.0–5.0)
HCT: 43.2 % (ref 39.0–52.0)
Hemoglobin: 14.5 g/dL (ref 13.0–17.0)
LYMPHS PCT: 38.6 % (ref 12.0–46.0)
Lymphs Abs: 2.4 10*3/uL (ref 0.7–4.0)
MCHC: 33.6 g/dL (ref 30.0–36.0)
MCV: 100.8 fl — ABNORMAL HIGH (ref 78.0–100.0)
Monocytes Absolute: 0.5 10*3/uL (ref 0.1–1.0)
Monocytes Relative: 8.1 % (ref 3.0–12.0)
Neutro Abs: 3 10*3/uL (ref 1.4–7.7)
Neutrophils Relative %: 48.9 % (ref 43.0–77.0)
PLATELETS: 156 10*3/uL (ref 150.0–400.0)
RBC: 4.28 Mil/uL (ref 4.22–5.81)
RDW: 14.3 % (ref 11.5–14.6)
WBC: 6.2 10*3/uL (ref 4.5–10.5)

## 2013-08-13 LAB — TSH: TSH: 0.92 u[IU]/mL (ref 0.35–5.50)

## 2013-08-17 ENCOUNTER — Ambulatory Visit (INDEPENDENT_AMBULATORY_CARE_PROVIDER_SITE_OTHER): Payer: Commercial Managed Care - HMO | Admitting: Family Medicine

## 2013-08-17 ENCOUNTER — Encounter: Payer: Self-pay | Admitting: Family Medicine

## 2013-08-17 VITALS — BP 102/68 | HR 94 | Temp 97.4°F | Ht 66.25 in | Wt 150.5 lb

## 2013-08-17 DIAGNOSIS — M21619 Bunion of unspecified foot: Secondary | ICD-10-CM

## 2013-08-17 DIAGNOSIS — Z0000 Encounter for general adult medical examination without abnormal findings: Secondary | ICD-10-CM

## 2013-08-17 DIAGNOSIS — E785 Hyperlipidemia, unspecified: Secondary | ICD-10-CM

## 2013-08-17 DIAGNOSIS — M21611 Bunion of right foot: Secondary | ICD-10-CM | POA: Insufficient documentation

## 2013-08-17 DIAGNOSIS — M542 Cervicalgia: Secondary | ICD-10-CM

## 2013-08-17 NOTE — Patient Instructions (Signed)
Take care of yourself  Keep eating a healthy diet and stay active  Stop up front on the way out regarding the referrals to podiatry and physical therapy

## 2013-08-17 NOTE — Progress Notes (Signed)
Pre visit review using our clinic review tool, if applicable. No additional management support is needed unless otherwise documented below in the visit note. 

## 2013-08-17 NOTE — Progress Notes (Signed)
Subjective:    Patient ID: David Duncan, male    DOB: 15-May-1930, 80 y.o.   MRN: [id number]  HPI I have personally reviewed the Medicare Annual Wellness questionnaire and have noted 1. The patient's medical and social history 2. Their use of alcohol, tobacco or illicit drugs 3. Their current medications and supplements 4. The patient's functional ability including ADL's, fall risks, home safety risks and hearing or visual             impairment. 5. Diet and physical activities 6. Evidence for depression or mood disorders  The patients weight, height, BMI have been recorded in the chart and visual acuity is per eye clinic.  I have made referrals, counseling and provided education to the patient based review of the above and I have provided the pt with a written personalized care plan for preventive services.  Doing pretty well overall  Nothing new going on   Wt is down 6 lb with bmi of 24  He is trying to keep weight no more than 150  Tries to walk for exercise  Also eats a healthy diet and eats less in general (has to make the effort)    See scanned forms.  Routine anticipatory guidance given to patient.  See health maintenance. Colon cancer screening 1/07 - he is not interested in colon cancer screening    Flu vaccine- he declines  Tetanus vaccine - due for 10 year booster was 1/05  Pneumovax 1/10 (pneumovax) and he declines the prevnar  Zoster vaccine 1/09  Prostate cancer screening- no prostate symptoms at all / just nocturia occ which is normal for him if he drinks a lot of liquid before bed  Advance directive- he has a living will and a power of attourney (his wife)  Cognitive function addressed- see scanned forms- and if abnormal then additional documentation follows. - he notices some short term problems -no change from previous however and doing well overall  The psychiatric medicines make it a bit worse   Mood is better since his last psychiatrist several months  ago - had a bit of a hard time until then   He still sees Dr Sharol Roussel - and still does heavy metal chelation therapy- he thinks it is still helping    PMH and SH reviewed  Meds, vitals, and allergies reviewed.   ROS: See HPI.  Otherwise negative.     Hx of hyperlipidemia Lab Results  Component Value Date   CHOL 197 08/13/2013   CHOL 152 08/02/2012   CHOL 176 07/13/2011   Lab Results  Component Value Date   HDL 56.40 08/13/2013   HDL 42.60 08/02/2012   HDL 50.60 07/13/2011   Lab Results  Component Value Date   LDLCALC 127* 08/13/2013   LDLCALC 97 08/02/2012   LDLCALC 117* 07/13/2011   Lab Results  Component Value Date   TRIG 66.0 08/13/2013   TRIG 61.0 08/02/2012   TRIG 42.0 07/13/2011   Lab Results  Component Value Date   CHOLHDL 3 08/13/2013   CHOLHDL 4 08/02/2012   CHOLHDL 3 07/13/2011   No results found for this basename: LDLDIRECT   overall pretty stable - HDL and LDL are both up    Glucose 105   Her needs ref for PT for his neck- he does this intermittently   Also needs ref to Dr Milinda Pointer for bunion on R foot lateral  It hurts and makes it hard to walk at times   Patient Active  Problem List   Diagnosis Date Noted  . Macular degeneration 10/16/2012  . Encounter for Medicare annual wellness exam 08/16/2012  . Neck pain 02/19/2011  . Hearing decreased 12/16/2010  . Medication adverse effect 07/28/2010  . Prostate cancer screening 07/28/2010  . Long term (current) use of anticoagulants 07/22/2010  . HYPERLIPIDEMIA-MIXED 06/01/2010  . ATRIAL FIBRILLATION 11/28/2009  . ATRIAL FLUTTER 11/28/2009  . CVA 11/28/2009  . BIPOLAR AFFECTIVE DISORDER 02/20/2009   Past Medical History  Diagnosis Date  . GERD (gastroesophageal reflux disease)   . Osteoarthritis     in neck and left knee  . Bipolar disorder     hospitalized in past  . History of kidney stones     30 years ago  . Basal cell cancer 2008    on shoulder  . Macular degeneration   . Atrial fibrillation   . CVA  (cerebral vascular accident) 8/11   Past Surgical History  Procedure Laterality Date  . Back surgery  1991   History  Substance Use Topics  . Smoking status: Never Smoker   . Smokeless tobacco: Not on file  . Alcohol Use: No   Family History  Problem Relation Age of Onset  . Stroke Father   . Cancer Brother     prostate  . Coronary artery disease Brother   . Hypertension Brother    No Known Allergies Current Outpatient Prescriptions on File Prior to Visit  Medication Sig Dispense Refill  . Ascorbic Acid (VITAMIN C) 500 MG tablet Take 500 mg by mouth 4 (four) times daily.        . B Complex Vitamins (B COMPLEX PO) Take by mouth 2 (two) times daily.       . Cholecalciferol (VITAMIN D-3) 5000 UNITS TABS Take 1 tablet by mouth 2 (two) times daily.      . Cyanocobalamin (VITAMIN B-12) 2500 MCG SUBL Take 2 tabs by mouth daily       . Ginkgo Biloba (GINKOBA PO) Take by mouth 3 (three) times daily.        . Glucosamine-Chondroitin (GLUCOSAMINE CHONDR COMPLEX PO) Take by mouth 2 (two) times daily.        . NON FORMULARY Lithium Orotate 130mg -take twice daily       . Saw Palmetto 450 MG CAPS Take 1 capsule by mouth 2 (two) times daily.       [name] Kitchen selenium sulfide (SELSUN) 2.5 % shampoo Apply topically once a week. Apply to rash on chest and leave on overnight. Wash off in the morning  118 mL  3  . Taurine 500 MG CAPS Take by mouth 2 (two) times daily.        [name] Kitchen triamcinolone lotion (KENALOG) 0.1 % Apply topically 2 (two) times daily as needed. For itchy rash  60 mL  1  . Tyrosine 500 MG CAPS Take by mouth 2 (two) times daily.        [name] Kitchen warfarin (COUMADIN) 5 MG tablet Use as directed by anticoagulation clinic  90 tablet  1   No current facility-administered medications on file prior to visit.    Review of Systems Review of Systems  Constitutional: Negative for fever, appetite change, fatigue and unexpected weight change.  Eyes: Negative for pain and visual disturbance.  Respiratory:  Negative for cough and shortness of breath.   Cardiovascular: Negative for cp or palpitations    Gastrointestinal: Negative for nausea, diarrhea and constipation.  Genitourinary: Negative for urgency and frequency.  Skin: Negative for pallor  or rash   MSK pos for bunion on R foot  Neurological: Negative for weakness, light-headedness, numbness and headaches.  Hematological: Negative for adenopathy. Does not bruise/bleed easily.  Psychiatric/Behavioral: pos for bipolar symptoms/ labile mood that are fairly controlled          Objective:   Physical Exam  Constitutional: He appears well-developed and well-nourished. No distress.  HENT:  Head: Normocephalic and atraumatic.  Right Ear: External ear normal.  Left Ear: External ear normal.  Nose: Nose normal.  Mouth/Throat: Oropharynx is clear and moist.  Eyes: Conjunctivae and EOM are normal. Pupils are equal, round, and reactive to light. Right eye exhibits no discharge. Left eye exhibits no discharge. No scleral icterus.  Neck: Normal range of motion. Neck supple. No JVD present. Carotid bruit is not present. No thyromegaly present.  Cardiovascular: Normal rate, regular rhythm, normal heart sounds and intact distal pulses.  Exam reveals no gallop.   Pulmonary/Chest: Effort normal and breath sounds normal. No respiratory distress. He has no wheezes. He exhibits no tenderness.  Abdominal: Soft. Bowel sounds are normal. He exhibits no distension, no abdominal bruit and no mass. There is no tenderness.  Musculoskeletal: He exhibits no edema and no tenderness.  Lateral bunion R foot-non tender  Lymphadenopathy:    He has no cervical adenopathy.  Neurological: He is alert. He has normal reflexes. No cranial nerve deficit. He exhibits normal muscle tone. Coordination normal.  Skin: Skin is warm and dry. No rash noted. No erythema. No pallor.  Psychiatric: His speech is normal. Thought content normal. His mood appears not anxious. His affect is  blunt. His affect is not inappropriate. He is slowed. He is not withdrawn. Cognition and memory are normal.  Pt seems more down than usual with some psychomotor slowing  He is attentive.          Assessment & Plan:

## 2013-08-19 NOTE — Assessment & Plan Note (Signed)
Reviewed health habits including diet and exercise and skin cancer prevention Reviewed appropriate screening tests for age  Also reviewed health mt list, fam hx and immunization status , as well as social and family history   See HPI Labs reviewed  

## 2013-08-19 NOTE — Assessment & Plan Note (Signed)
Will ref to podiatry as requested

## 2013-08-19 NOTE — Assessment & Plan Note (Signed)
Ongoing - muscular  Ref to PT which has been very helpful in the past

## 2013-08-19 NOTE — Assessment & Plan Note (Signed)
Disc goals for lipids and reasons to control them Rev labs with pt Rev low sat fat diet in detail   

## 2013-09-17 ENCOUNTER — Ambulatory Visit (INDEPENDENT_AMBULATORY_CARE_PROVIDER_SITE_OTHER): Payer: Commercial Managed Care - HMO

## 2013-09-17 ENCOUNTER — Ambulatory Visit (INDEPENDENT_AMBULATORY_CARE_PROVIDER_SITE_OTHER): Payer: Commercial Managed Care - HMO | Admitting: Podiatry

## 2013-09-17 ENCOUNTER — Other Ambulatory Visit: Payer: Self-pay | Admitting: *Deleted

## 2013-09-17 ENCOUNTER — Encounter: Payer: Self-pay | Admitting: Podiatry

## 2013-09-17 VITALS — BP 107/79 | HR 61 | Resp 16 | Ht 67.0 in | Wt 150.0 lb

## 2013-09-17 DIAGNOSIS — M21629 Bunionette of unspecified foot: Secondary | ICD-10-CM

## 2013-09-17 DIAGNOSIS — M21619 Bunion of unspecified foot: Secondary | ICD-10-CM

## 2013-09-17 DIAGNOSIS — M778 Other enthesopathies, not elsewhere classified: Secondary | ICD-10-CM

## 2013-09-17 DIAGNOSIS — M779 Enthesopathy, unspecified: Secondary | ICD-10-CM

## 2013-09-17 DIAGNOSIS — M7750 Other enthesopathy of unspecified foot: Secondary | ICD-10-CM

## 2013-09-17 NOTE — Progress Notes (Signed)
   Subjective:    Patient ID: David Duncan, male    DOB: 1930-04-26, 80 y.o.   MRN: [id number]  HPI Comments: This side of my foot hurts. Right lateral side of foot . 5th met pain. Maybe a shot would help. Hurts when i walk   Foot Pain      Review of Systems  All other systems reviewed and are negative.      Objective:   Physical Exam: I reviewed his past medical history medications allergies surgeries social history and review of systems. Pulses are strongly palpable bilateral. Neurologic sensorium is intact per since once the monofilament. Deep tendon reflexes are intact bilateral muscle strength is 5 over 5 dorsiflexors plantar flexors inverters everters all intrinsic musculature is intact. Orthopedic evaluation demonstrates pain on palpation fifth metatarsal of the right foot with overlying soft tissue bogginess indicative of a bursitis. Radiographic evaluation also demonstrates what appears to be benign osseous problem here but more of a soft tissue lesion such as a swelling or bursitis in this area.        Assessment & Plan:  Assessment: Bursitis capsulitis fifth metatarsophalangeal joint right foot.  Plan: Injected the area today with Kenalog and local anesthetic discussed review shoe gear stretching sizes ice therapy and shoe gear modifications. Followup with him on a when necessary basis.

## 2013-10-04 ENCOUNTER — Telehealth: Payer: Self-pay | Admitting: Family Medicine

## 2013-10-04 NOTE — Telephone Encounter (Signed)
Encounter opened in error

## 2013-12-19 DIAGNOSIS — R0681 Apnea, not elsewhere classified: Secondary | ICD-10-CM | POA: Insufficient documentation

## 2013-12-28 ENCOUNTER — Telehealth: Payer: Self-pay | Admitting: Family Medicine

## 2013-12-28 DIAGNOSIS — M542 Cervicalgia: Secondary | ICD-10-CM

## 2013-12-28 NOTE — Telephone Encounter (Signed)
David Duncan @ [facility]  Stated [name] is trying to make a new appointment for PT for his neck.  David Duncan stated he has not been in to have pt since June  David Duncan needs order to make an appointment

## 2013-12-28 NOTE — Telephone Encounter (Signed)
Referral done

## 2014-01-16 DIAGNOSIS — I482 Chronic atrial fibrillation, unspecified: Secondary | ICD-10-CM | POA: Insufficient documentation

## 2014-01-16 DIAGNOSIS — I5022 Chronic systolic (congestive) heart failure: Secondary | ICD-10-CM | POA: Insufficient documentation

## 2014-02-15 DIAGNOSIS — I480 Paroxysmal atrial fibrillation: Secondary | ICD-10-CM | POA: Insufficient documentation

## 2014-02-15 DIAGNOSIS — I4891 Unspecified atrial fibrillation: Secondary | ICD-10-CM | POA: Insufficient documentation

## 2014-05-29 ENCOUNTER — Ambulatory Visit (INDEPENDENT_AMBULATORY_CARE_PROVIDER_SITE_OTHER)
Admission: RE | Admit: 2014-05-29 | Discharge: 2014-05-29 | Disposition: A | Discharge status: Home / Self Care | Payer: Commercial Managed Care - HMO | Source: Ambulatory Visit | Attending: Family Medicine | Admitting: Family Medicine

## 2014-05-29 ENCOUNTER — Ambulatory Visit (INDEPENDENT_AMBULATORY_CARE_PROVIDER_SITE_OTHER): Payer: Medicare PPO | Admitting: Family Medicine

## 2014-05-29 ENCOUNTER — Encounter: Payer: Self-pay | Admitting: Family Medicine

## 2014-05-29 VITALS — BP 102/84 | HR 100 | Temp 97.5°F | Ht 66.25 in | Wt 155.4 lb

## 2014-05-29 DIAGNOSIS — M542 Cervicalgia: Secondary | ICD-10-CM

## 2014-05-29 NOTE — Progress Notes (Signed)
Subjective:    Patient ID: David Duncan, male    DOB: 1930-08-14, 81 y.o.   MRN: [id number]  HPI Here with acute on chronic neck pain  Worse than usual lately   About 2 y ago - Dr Sabra Heck did an xray and told he has spurs on it  ? If evidence of disc dz   He is interested in seeing someone at [facility] had pain for years  It may have started after a skiing injury- came down on his head  PT has helped in the past - has done that several times  [facility]  L side of neck - radiates to both shoulders-L is worse  Sometimes goes down L arm - feels pain , no numbness or weakness in arm or hand  He does not take medicine for it  occ tylenol (cannot take nsaid)   Essential oils seems to help   Patient Active Problem List   Diagnosis Date Noted  . Bunion, right foot 08/17/2013  . Macular degeneration 10/16/2012  . Encounter for Medicare annual wellness exam 08/16/2012  . Neck pain 02/19/2011  . Hearing decreased 12/16/2010  . Medication adverse effect 07/28/2010  . Prostate cancer screening 07/28/2010  . Long term (current) use of anticoagulants 07/22/2010  . HYPERLIPIDEMIA-MIXED 06/01/2010  . ATRIAL FIBRILLATION 11/28/2009  . ATRIAL FLUTTER 11/28/2009  . CVA 11/28/2009  . BIPOLAR AFFECTIVE DISORDER 02/20/2009   Past Medical History  Diagnosis Date  . GERD (gastroesophageal reflux disease)   . Osteoarthritis     in neck and left knee  . Bipolar disorder     hospitalized in past  . History of kidney stones     30 years ago  . Basal cell cancer 2008    on shoulder  . Macular degeneration   . Atrial fibrillation   . CVA (cerebral vascular accident) 8/11   Past Surgical History  Procedure Laterality Date  . Back surgery  1991   History  Substance Use Topics  . Smoking status: Never Smoker   . Smokeless tobacco: Not on file  . Alcohol Use: No   Family History  Problem Relation Age of Onset  . Stroke Father   . Cancer Brother     prostate  .  Coronary artery disease Brother   . Hypertension Brother    No Known Allergies Current Outpatient Prescriptions on File Prior to Visit  Medication Sig Dispense Refill  . Ascorbic Acid (VITAMIN C) 500 MG tablet Take 500 mg by mouth 4 (four) times daily.      . B Complex Vitamins (B COMPLEX PO) Take by mouth 2 (two) times daily.     . Cholecalciferol (VITAMIN D-3) 5000 UNITS TABS Take 1 tablet by mouth 2 (two) times daily.    . clonazePAM (KLONOPIN) 0.5 MG tablet     . Cyanocobalamin (VITAMIN B-12) 2500 MCG SUBL Take 2 tabs by mouth daily     . escitalopram (LEXAPRO) 10 MG tablet Take 1 tablet by mouth daily.    . Ginkgo Biloba (GINKOBA PO) Take by mouth 3 (three) times daily.      . Glucosamine-Chondroitin (GLUCOSAMINE CHONDR COMPLEX PO) Take by mouth 2 (two) times daily.      . NON FORMULARY Lithium Orotate 130mg -take twice daily     . Saw Palmetto 450 MG CAPS Take 1 capsule by mouth 2 (two) times daily.     [name] Kitchen selenium sulfide (SELSUN) 2.5 % shampoo Apply  topically once a week. Apply to rash on chest and leave on overnight. Wash off in the morning 118 mL 3  . Taurine 500 MG CAPS Take by mouth 2 (two) times daily.      . Thyroid 325 MG TABS Take 1 tablet by mouth daily.    [name] Kitchen triamcinolone lotion (KENALOG) 0.1 % Apply topically 2 (two) times daily as needed. For itchy rash 60 mL 1  . Tyrosine 500 MG CAPS Take by mouth 2 (two) times daily.      [name] Kitchen warfarin (COUMADIN) 5 MG tablet Use as directed by anticoagulation clinic 90 tablet 1   No current facility-administered medications on file prior to visit.     Review of Systems Review of Systems  Constitutional: Negative for fever, appetite change, fatigue and unexpected weight change.  Eyes: Negative for pain and visual disturbance.  Respiratory: Negative for cough and shortness of breath.   Cardiovascular: Negative for cp or palpitations    Gastrointestinal: Negative for nausea, diarrhea and constipation.  Genitourinary: Negative for  urgency and frequency.  Skin: Negative for pallor or rash   MSK pos for neck pain, neg for joint swelling  Neurological: Negative for weakness, light-headedness, numbness and headaches.  Hematological: Negative for adenopathy. Does not bruise/bleed easily.  Psychiatric/Behavioral: Negative for dysphoric mood. The patient is not nervous/anxious.         Objective:   Physical Exam  Constitutional: He appears well-developed and well-nourished. No distress.  HENT:  Head: Normocephalic and atraumatic.  Eyes: Conjunctivae and EOM are normal. Pupils are equal, round, and reactive to light. No scleral icterus.  Neck: Normal range of motion. Neck supple.  Cardiovascular: Normal rate.   Pulmonary/Chest: Effort normal and breath sounds normal.  Musculoskeletal:       Cervical back: He exhibits tenderness and bony tenderness. He exhibits normal range of motion, no swelling, no edema and normal pulse.  Tender over lower cervical spine Mild crepitus Pain upon rotation and tilt L but nl rom  No trapezius tenderness L para cervical muscle tightness   No neuro s/s  Lymphadenopathy:    He has no cervical adenopathy.  Neurological: He is alert. He has normal strength and normal reflexes. He displays no atrophy. No cranial nerve deficit or sensory deficit. He exhibits normal muscle tone. Coordination normal.  Skin: Skin is warm and dry. No rash noted. No erythema.  Psychiatric: He has a normal mood and affect.          Assessment & Plan:   Problem List Items Addressed This Visit      Other   Neck pain - Primary    Acute on chronic, radiating to L arm w/o neuro findings Reported bone spurs on xray 2 y ago  Will repeat cervical films today Pt desires ref to ortho at [facility] after report returns if appropriate  In the meantime-will continue stretches and tylenol if needed       Relevant Orders   DG Cervical Spine Complete

## 2014-05-29 NOTE — Assessment & Plan Note (Signed)
Acute on chronic, radiating to L arm w/o neuro findings Reported bone spurs on xray 2 y ago  Will repeat cervical films today Pt desires ref to ortho at [facility] after report returns if appropriate  In the meantime-will continue stretches and tylenol if needed

## 2014-05-29 NOTE — Progress Notes (Signed)
Pre visit review using our clinic review tool, if applicable. No additional management support is needed unless otherwise documented below in the visit note. 

## 2014-05-29 NOTE — Patient Instructions (Signed)
Xray of neck today  Depending on result - will likely refer to [place] doing stretches and use oils if they help

## 2014-06-03 ENCOUNTER — Telehealth: Payer: Self-pay | Admitting: Family Medicine

## 2014-06-03 DIAGNOSIS — M542 Cervicalgia: Secondary | ICD-10-CM

## 2014-06-03 NOTE — Telephone Encounter (Signed)
Referral done

## 2014-06-03 NOTE — Telephone Encounter (Signed)
Called and spoken to patient. Let him know that Dr Glori Bickers has done the referral and that he will get a call regarding it.

## 2014-06-03 NOTE — Addendum Note (Signed)
Addended by: Loura Pardon A on: 06/03/2014 01:24 PM   Modules accepted: Orders

## 2014-06-03 NOTE — Telephone Encounter (Signed)
Patient didn't know how to look in My Chart, so I deactivated his my chart.  I let patient know what Dr.Tower said about his x-ray.  Patient would like to be referred to [facility].  Patient can go anytime.

## 2014-06-20 ENCOUNTER — Telehealth: Payer: Self-pay | Admitting: Family Medicine

## 2014-06-20 NOTE — Telephone Encounter (Signed)
Pt dropped off Medical Clearance Form to be completed. Please call [phone] when complete.  Form in Dr. Glori Bickers INbox.

## 2014-06-21 NOTE — Telephone Encounter (Signed)
Done and in IN box 

## 2014-08-08 DIAGNOSIS — R079 Chest pain, unspecified: Secondary | ICD-10-CM | POA: Insufficient documentation

## 2014-08-08 DIAGNOSIS — I071 Rheumatic tricuspid insufficiency: Secondary | ICD-10-CM | POA: Insufficient documentation

## 2014-08-08 DIAGNOSIS — I340 Nonrheumatic mitral (valve) insufficiency: Secondary | ICD-10-CM | POA: Insufficient documentation

## 2014-08-29 ENCOUNTER — Telehealth: Payer: Self-pay | Admitting: Family Medicine

## 2014-08-29 DIAGNOSIS — M542 Cervicalgia: Secondary | ICD-10-CM

## 2014-08-29 NOTE — Telephone Encounter (Signed)
Pt called and would like referral to Neurologist, Dr. Manuella Ghazi at [facility] for his continuing neck pain.  [employer] authorization [id number].  Fax records to [place] at [phone], [place] number to call pt is [phone] / lt

## 2014-08-30 NOTE — Telephone Encounter (Signed)
Ref done Will route to [place] 

## 2014-09-16 ENCOUNTER — Encounter: Payer: Self-pay | Admitting: Psychiatry

## 2014-09-16 ENCOUNTER — Ambulatory Visit (INDEPENDENT_AMBULATORY_CARE_PROVIDER_SITE_OTHER): Payer: Commercial Managed Care - HMO | Admitting: Psychiatry

## 2014-09-16 ENCOUNTER — Other Ambulatory Visit: Payer: Self-pay

## 2014-09-16 VITALS — BP 100/68 | HR 86 | Temp 97.3°F | Ht 66.5 in

## 2014-09-16 DIAGNOSIS — I38 Endocarditis, valve unspecified: Secondary | ICD-10-CM | POA: Insufficient documentation

## 2014-09-16 DIAGNOSIS — I420 Dilated cardiomyopathy: Secondary | ICD-10-CM | POA: Insufficient documentation

## 2014-09-16 DIAGNOSIS — I639 Cerebral infarction, unspecified: Secondary | ICD-10-CM | POA: Insufficient documentation

## 2014-09-16 DIAGNOSIS — F411 Generalized anxiety disorder: Secondary | ICD-10-CM

## 2014-09-16 DIAGNOSIS — F3176 Bipolar disorder, in full remission, most recent episode depressed: Secondary | ICD-10-CM

## 2014-09-16 DIAGNOSIS — E785 Hyperlipidemia, unspecified: Secondary | ICD-10-CM | POA: Insufficient documentation

## 2014-09-16 MED ORDER — ESCITALOPRAM OXALATE 5 MG PO TABS: 5.0000 mg | ORAL_TABLET | Freq: Every day | ORAL | Status: DC

## 2014-09-16 MED ORDER — BUPROPION HCL ER (SR) 100 MG PO TB12: 100.0000 mg | ORAL_TABLET | Freq: Every day | ORAL | Status: DC

## 2014-09-16 MED ORDER — CLONAZEPAM 0.5 MG PO TABS: 0.2500 mg | ORAL_TABLET | Freq: Two times a day (BID) | ORAL | Status: DC

## 2014-09-16 NOTE — Progress Notes (Signed)
[facility] MD/PA/NP OP Progress Note  09/16/2014 12:14 PM David Duncan  MRN:  [id number]  Subjective:  Patient returns for follow-up of his polar disorder and generalized anxiety disorder. He presents after last being seen by Dr. Annitta Jersey in September 2015. He states things have continued to go well and describes his mood as the same. When discussed about his daily routine he states he tries to do as little as possible and smiles and laughs mimicking that comment. He did state that he recently saw his 79 year old brother in [place] and spent a couple of days up there and enjoyed that. He states he sleeping well and his appetite is good.  Today he presents wanting to decrease several of his medications. He states he would like to decrease his Lexapro from 10 mg to 5 and states that he actually has been doing this already and cutting his 10 mg tablets in half. He states he would like to take less of the Wellbutrin SR and he would like to take 100 mg of that a day. He states he is overly been cutting his previously prescribed 150 mg SR tablets in half. He also states he would like to decrease his clonazepam to 0.25 mg twice a day. I did discuss that if he wanted to make these changes are probably want to see him in follow-up closer than in his past follow-up intervals. I'll prefer to see him back in a month and he agreed to this plan. Chief Complaint:  Chief Complaint    Medication Refill     Visit Diagnosis:  No diagnosis found.  Past Medical History:  Past Medical History  Diagnosis Date  . GERD (gastroesophageal reflux disease)   . Osteoarthritis     in neck and left knee  . Bipolar disorder     hospitalized in past  . History of kidney stones     30 years ago  . Basal cell cancer 2008    on shoulder  . Macular degeneration   . Atrial fibrillation   . CVA (cerebral vascular accident) 8/11  . Anxiety   . Generalized anxiety disorder     Past Surgical History  Procedure Laterality Date  .  Back surgery  1991   Family History:  Family History  Problem Relation Age of Onset  . Stroke Father   . Cancer Brother     prostate  . Coronary artery disease Brother   . Hypertension Brother    Social History:  History   Social History  . Marital Status: Married    Spouse Name: N/A  . Number of Children: N/A  . Years of Education: N/A   Occupational History  . retired     used to work on farm, Press photographer, Cabin crew   Social History Main Topics  . Smoking status: Never Smoker   . Smokeless tobacco: Never Used  . Alcohol Use: No  . Drug Use: No  . Sexual Activity: Yes    Birth Control/ Protection: None   Other Topics Concern  . None   Social History Narrative   Moved from [place] 3/10   Walks 30-40 minutes daily   Additional History:   Assessment:   Musculoskeletal: Strength & Muscle Tone: within normal limits Gait & Station: normal Patient leans: N/A  Psychiatric Specialty Exam: HPI  Review of Systems  Psychiatric/Behavioral: Negative for depression, suicidal ideas, hallucinations, memory loss and substance abuse. The patient is not nervous/anxious and does not have insomnia.  Blood pressure 100/68, pulse 86, temperature 97.3 F (36.3 C), temperature source Tympanic, height 5' 6.5" (1.689 m).There is no weight on file to calculate BMI.  General Appearance: Well Groomed  Eye Contact:  Good  Speech:  Normal Rate  Volume:  Normal  Mood:  Euphoric "same"  Affect:  Appropriate and Full Range  Thought Process:  Linear and Logical  Orientation:  Full (Time, Place, and Person)  Thought Content:  Negative  Suicidal Thoughts:  No  Homicidal Thoughts:  No  Memory:  Immediate;   Good Recent;   Good Remote;   Good  Judgement:  Good  Insight:  Good  Psychomotor Activity:  Normal  Concentration:  Good  Recall:  Good  Fund of Knowledge: Good  Language: Good  Akathisia:  Negative  Handed:  Right unknown  AIMS (if indicated):  No done  Assets:   Communication Skills Desire for Improvement  ADL's:  Intact  Cognition: WNL  Sleep:  good   Is the patient at risk to self?  No. Has the patient been a risk to self in the past 6 months?  No. Has the patient been a risk to self within the distant past?  No. Is the patient a risk to others?  No. Has the patient been a risk to others in the past 6 months?  No. Has the patient been a risk to others within the distant past?  No.  Current Medications: Current Outpatient Prescriptions  Medication Sig Dispense Refill  . Ascorbic Acid (VITAMIN C) 500 MG tablet Take 500 mg by mouth 4 (four) times daily.      . B Complex Vitamins (B COMPLEX PO) Take by mouth 2 (two) times daily.     . Cholecalciferol (VITAMIN D-3) 5000 UNITS TABS Take 1 tablet by mouth 2 (two) times daily.    . clonazePAM (KLONOPIN) 0.5 MG tablet Take 0.5 tablets (0.25 mg total) by mouth 2 (two) times daily. 90 tablet 0  . Cyanocobalamin (VITAMIN B-12) 2500 MCG SUBL Take 2 tabs by mouth daily     . escitalopram (LEXAPRO) 5 MG tablet Take 1 tablet (5 mg total) by mouth daily. 90 tablet 0  . Ginkgo Biloba (GINKOBA PO) Take by mouth 3 (three) times daily.      [name] Kitchen NATURE-THROID 48.75 MG TABS   0  . potassium chloride (MICRO-K) 10 MEQ CR capsule     . Saw Palmetto 450 MG CAPS Take 1 capsule by mouth 2 (two) times daily.     [name] Kitchen selenium sulfide (SELSUN) 2.5 % shampoo Apply topically once a week. Apply to rash on chest and leave on overnight. Wash off in the morning 118 mL 3  . warfarin (COUMADIN) 2.5 MG tablet     . warfarin (COUMADIN) 5 MG tablet Use as directed by anticoagulation clinic 90 tablet 1  . buPROPion (WELLBUTRIN SR) 100 MG 12 hr tablet Take 1 tablet (100 mg total) by mouth daily. 90 tablet 0  . Glucosamine-Chondroitin (GLUCOSAMINE CHONDR COMPLEX PO) Take by mouth 2 (two) times daily.      . NON FORMULARY Lithium Orotate 130mg -take twice daily     . Taurine 500 MG CAPS Take by mouth 2 (two) times daily.      . Thyroid 325  MG TABS Take 1 tablet by mouth daily.    [name] Kitchen triamcinolone lotion (KENALOG) 0.1 % Apply topically 2 (two) times daily as needed. For itchy rash (Patient not taking: Reported on 09/16/2014) 60 mL 1  . Tyrosine  500 MG CAPS Take by mouth 2 (two) times daily.       No current facility-administered medications for this visit.    Medical Decision Making:  Established Problem, Stable/Improving (1)  Treatment Plan Summary:Medication management We will changes in medications per patient's request. However will want to follow up with him in 1 month to observe for any changes in mood. Thus we'll decrease his clonazepam from 0.5 mg twice a day to 0.25 mg twice a day. We'll decrease his Wellbutrin SR to 100 mg a day. This is down from 150 mg daily. Will decrease his Lexapro from 10 mg a day to 5 mg a day. Although patient states he is already been taking 5 mg a day of his Lexapro. Follow up in 1 month. He's been encouraged call with any questions or concerns prior to his next appointment.  Faith Rogue 09/16/2014, 12:14 PM

## 2014-10-28 ENCOUNTER — Other Ambulatory Visit: Payer: Self-pay | Admitting: Neurology

## 2014-10-28 DIAGNOSIS — M542 Cervicalgia: Secondary | ICD-10-CM

## 2014-10-28 DIAGNOSIS — R29898 Other symptoms and signs involving the musculoskeletal system: Secondary | ICD-10-CM

## 2014-11-04 ENCOUNTER — Ambulatory Visit
Admission: RE | Admit: 2014-11-04 | Discharge: 2014-11-04 | Disposition: A | Discharge status: Home / Self Care | Payer: Commercial Managed Care - HMO | Source: Ambulatory Visit | Attending: Neurology | Admitting: Neurology

## 2014-11-04 DIAGNOSIS — M47892 Other spondylosis, cervical region: Secondary | ICD-10-CM | POA: Insufficient documentation

## 2014-11-04 DIAGNOSIS — R29898 Other symptoms and signs involving the musculoskeletal system: Secondary | ICD-10-CM

## 2014-11-04 DIAGNOSIS — G8929 Other chronic pain: Secondary | ICD-10-CM | POA: Diagnosis present

## 2014-11-04 DIAGNOSIS — M542 Cervicalgia: Secondary | ICD-10-CM | POA: Diagnosis present

## 2014-11-04 DIAGNOSIS — J323 Chronic sphenoidal sinusitis: Secondary | ICD-10-CM | POA: Insufficient documentation

## 2014-11-04 DIAGNOSIS — M25512 Pain in left shoulder: Secondary | ICD-10-CM | POA: Diagnosis present

## 2014-11-04 DIAGNOSIS — M5382 Other specified dorsopathies, cervical region: Secondary | ICD-10-CM | POA: Diagnosis not present

## 2014-11-04 DIAGNOSIS — M5032 Other cervical disc degeneration, mid-cervical region: Secondary | ICD-10-CM | POA: Diagnosis not present

## 2014-11-20 ENCOUNTER — Telehealth: Payer: Self-pay

## 2014-11-20 NOTE — Telephone Encounter (Signed)
Letter mailed and left voicemail letting pt know letter mailed

## 2014-11-20 NOTE — Telephone Encounter (Signed)
Done and in IN box 

## 2014-11-20 NOTE — Telephone Encounter (Signed)
Pt left v/m requesting a letter stating that pt has motion sickness. Pt is going on a bus trip and if pt has this letter pt will be allowed to sit at the front of the bus. Pt request letter mailed to home address.Please advise.

## 2014-12-06 ENCOUNTER — Ambulatory Visit (INDEPENDENT_AMBULATORY_CARE_PROVIDER_SITE_OTHER): Payer: Commercial Managed Care - HMO | Admitting: Family Medicine

## 2014-12-06 ENCOUNTER — Ambulatory Visit (INDEPENDENT_AMBULATORY_CARE_PROVIDER_SITE_OTHER)
Admission: RE | Admit: 2014-12-06 | Discharge: 2014-12-06 | Disposition: A | Discharge status: Home / Self Care | Payer: Commercial Managed Care - HMO | Source: Ambulatory Visit | Attending: Family Medicine | Admitting: Family Medicine

## 2014-12-06 ENCOUNTER — Encounter: Payer: Self-pay | Admitting: Family Medicine

## 2014-12-06 VITALS — BP 118/70 | HR 98 | Temp 98.2°F | Ht 66.5 in | Wt 149.0 lb

## 2014-12-06 DIAGNOSIS — M25512 Pain in left shoulder: Secondary | ICD-10-CM

## 2014-12-06 NOTE — Progress Notes (Signed)
Pre visit review using our clinic review tool, if applicable. No additional management support is needed unless otherwise documented below in the visit note. 

## 2014-12-06 NOTE — Patient Instructions (Signed)
Xray of shoulder today  Try to avoid activities when you internally rotate shoulder  Try ice / cold pack on shoulder 10 minutes at a time when it bothers you  We will contact you with results

## 2014-12-06 NOTE — Progress Notes (Signed)
Subjective:    Patient ID: David Duncan, male    DOB: June 28, 1930, 81 y.o.   MRN: [id number]  HPI Here with L shoulder pain   Has been going to the chiropractor  Says it could have a "tear"  Pain going on for years  Started when he lifted 200lb suitcase about 15 years ago  Getting worse   Can move it - can lift it  Internal rotation hurts it  Ext rotation is ok   Seldom puts heat on it  Does not take any pain med   Patient Active Problem List   Diagnosis Date Noted  . Cardiomyopathy, dilated 09/16/2014  . Dyslipidemia 09/16/2014  . Cerebral vascular accident 09/16/2014  . Heart valve disease 09/16/2014  . AF (paroxysmal atrial fibrillation) 02/15/2014  . Atrial fibrillation, chronic 01/16/2014  . Chronic systolic heart failure 46/56/8127  . Breathlessness on exertion 12/19/2013  . Bunion, right foot 08/17/2013  . Macular degeneration 10/16/2012  . Encounter for Medicare annual wellness exam 08/16/2012  . Neck pain 02/19/2011  . Hearing decreased 12/16/2010  . Medication adverse effect 07/28/2010  . Prostate cancer screening 07/28/2010  . Long term (current) use of anticoagulants 07/22/2010  . HYPERLIPIDEMIA-MIXED 06/01/2010  . ATRIAL FIBRILLATION 11/28/2009  . ATRIAL FLUTTER 11/28/2009  . CVA 11/28/2009  . BIPOLAR AFFECTIVE DISORDER 02/20/2009   Past Medical History  Diagnosis Date  . GERD (gastroesophageal reflux disease)   . Osteoarthritis     in neck and left knee  . Bipolar disorder     hospitalized in past  . History of kidney stones     30 years ago  . Basal cell cancer 2008    on shoulder  . Macular degeneration   . Atrial fibrillation   . CVA (cerebral vascular accident) 8/11  . Anxiety   . Generalized anxiety disorder    Past Surgical History  Procedure Laterality Date  . Back surgery  1991   Social History  Substance Use Topics  . Smoking status: Never Smoker   . Smokeless tobacco: Never Used  . Alcohol Use: No   Family History    Problem Relation Age of Onset  . Stroke Father   . Cancer Brother     prostate  . Coronary artery disease Brother   . Hypertension Brother    No Known Allergies Current Outpatient Prescriptions on File Prior to Visit  Medication Sig Dispense Refill  . Ascorbic Acid (VITAMIN C) 500 MG tablet Take 500 mg by mouth 4 (four) times daily.      . B Complex Vitamins (B COMPLEX PO) Take by mouth 2 (two) times daily.     [name] Kitchen buPROPion (WELLBUTRIN SR) 100 MG 12 hr tablet Take 1 tablet (100 mg total) by mouth daily. 90 tablet 0  . Cholecalciferol (VITAMIN D-3) 5000 UNITS TABS Take 1 tablet by mouth 2 (two) times daily.    . clonazePAM (KLONOPIN) 0.5 MG tablet Take 0.5 tablets (0.25 mg total) by mouth 2 (two) times daily. 90 tablet 0  . Cyanocobalamin (VITAMIN B-12) 2500 MCG SUBL Take 2 tabs by mouth daily     . escitalopram (LEXAPRO) 5 MG tablet Take 1 tablet (5 mg total) by mouth daily. 90 tablet 0  . Ginkgo Biloba (GINKOBA PO) Take by mouth 3 (three) times daily.      . Glucosamine-Chondroitin (GLUCOSAMINE CHONDR COMPLEX PO) Take by mouth 2 (two) times daily.      [name] Kitchen NATURE-THROID 48.75 MG TABS   0  .  NON FORMULARY Lithium Orotate 130mg -take twice daily     . potassium chloride (MICRO-K) 10 MEQ CR capsule     . Saw Palmetto 450 MG CAPS Take 1 capsule by mouth 2 (two) times daily.     [name] Kitchen selenium sulfide (SELSUN) 2.5 % shampoo Apply topically once a week. Apply to rash on chest and leave on overnight. Wash off in the morning 118 mL 3  . Taurine 500 MG CAPS Take by mouth 2 (two) times daily.      . Thyroid 325 MG TABS Take 1 tablet by mouth daily.    [name] Kitchen triamcinolone lotion (KENALOG) 0.1 % Apply topically 2 (two) times daily as needed. For itchy rash 60 mL 1  . Tyrosine 500 MG CAPS Take by mouth 2 (two) times daily.      [name] Kitchen warfarin (COUMADIN) 2.5 MG tablet     . warfarin (COUMADIN) 5 MG tablet Use as directed by anticoagulation clinic 90 tablet 1   No current facility-administered medications on file  prior to visit.    Review of Systems Review of Systems  Constitutional: Negative for fever, appetite change, fatigue and unexpected weight change.  Eyes: Negative for pain and visual disturbance.  Respiratory: Negative for cough and shortness of breath.   Cardiovascular: Negative for cp or palpitations    Gastrointestinal: Negative for nausea, diarrhea and constipation.  Genitourinary: Negative for urgency and frequency.  Skin: Negative for pallor or rash   MSK pos for L shoulder pain , neg for joint swelling or redness  Neurological: Negative for weakness, light-headedness, numbness and headaches.  Hematological: Negative for adenopathy. Does not bruise/bleed easily.  Psychiatric/Behavioral: Negative for dysphoric mood. The patient is not nervous/anxious.         Objective:   Physical Exam  Constitutional: He appears well-developed and well-nourished. No distress.  HENT:  Head: Normocephalic and atraumatic.  Eyes: Conjunctivae and EOM are normal. Pupils are equal, round, and reactive to light.  Neck: Normal range of motion. Neck supple.  Cardiovascular: Normal rate.   Pulmonary/Chest: Effort normal and breath sounds normal.  Musculoskeletal: He exhibits tenderness. He exhibits no edema.       Left shoulder: He exhibits decreased range of motion and tenderness. He exhibits no swelling, no effusion, no crepitus, no deformity, no spasm, normal pulse and normal strength.  L shoulder: mild acromion tenderness Nl rom except pain with internal rotation past 10 degrees  Nl abduction  No crepitus or instability   No erythema or warmth   Lymphadenopathy:    He has no cervical adenopathy.  Neurological: He has normal strength. He displays no atrophy. No sensory deficit. He exhibits normal muscle tone.  Skin: Skin is warm and dry. No rash noted. No erythema.  Psychiatric: He has a normal mood and affect.          Assessment & Plan:   Problem List Items Addressed This Visit     Left shoulder pain - Primary    Ongoing L shoulder pain for years-getting worse  Pt's chiropractor suspected a rotator cuff tear  Fairly good exam with pain only on internal rotation today  Xray now -copy to pt for his chiropractor  Ortho if needed       Relevant Orders   DG Shoulder Left (Completed)

## 2014-12-08 NOTE — Assessment & Plan Note (Signed)
Ongoing L shoulder pain for years-getting worse  Pt's chiropractor suspected a rotator cuff tear  Fairly good exam with pain only on internal rotation today  Xray now -copy to pt for his chiropractor  Ortho if needed

## 2014-12-09 ENCOUNTER — Telehealth: Payer: Self-pay | Admitting: Family Medicine

## 2014-12-09 DIAGNOSIS — M25512 Pain in left shoulder: Secondary | ICD-10-CM

## 2014-12-09 NOTE — Telephone Encounter (Signed)
-----   Message from Tammi Sou, [place] sent at 12/09/2014 12:40 PM EDT ----- Pt notified of Xray results and Dr. Marliss Coots comments. Karna Christmas is making pt a copy of xrays to give to pt's chiropractor now. Pt does want to proceed with MRI and would like to go to [facility] to have it done. Please put referral in and I have advise pt one of our [facility] will call to schedule appt

## 2014-12-09 NOTE — Telephone Encounter (Signed)
Order done  Will route to [place]  I do not think he has any metal in body- may want to make sure however

## 2014-12-10 ENCOUNTER — Encounter: Payer: Self-pay | Admitting: Psychiatry

## 2014-12-10 ENCOUNTER — Ambulatory Visit (INDEPENDENT_AMBULATORY_CARE_PROVIDER_SITE_OTHER): Payer: Commercial Managed Care - HMO | Admitting: Psychiatry

## 2014-12-10 VITALS — BP 110/62 | HR 94 | Temp 98.6°F | Ht 66.0 in | Wt 150.0 lb

## 2014-12-10 DIAGNOSIS — F3176 Bipolar disorder, in full remission, most recent episode depressed: Secondary | ICD-10-CM | POA: Diagnosis not present

## 2014-12-10 DIAGNOSIS — F411 Generalized anxiety disorder: Secondary | ICD-10-CM

## 2014-12-10 MED ORDER — BUPROPION HCL ER (SR) 100 MG PO TB12: 100.0000 mg | ORAL_TABLET | Freq: Every day | ORAL | Status: DC

## 2014-12-10 MED ORDER — ESCITALOPRAM OXALATE 5 MG PO TABS: 5.0000 mg | ORAL_TABLET | Freq: Every day | ORAL | Status: DC

## 2014-12-10 MED ORDER — CLONAZEPAM 0.5 MG PO TABS: ORAL_TABLET | ORAL | Status: DC

## 2014-12-10 MED ORDER — TRAZODONE HCL 50 MG PO TABS: 25.0000 mg | ORAL_TABLET | Freq: Every evening | ORAL | Status: DC | PRN

## 2014-12-10 MED ORDER — TRAZODONE HCL 50 MG PO TABS: ORAL_TABLET | ORAL | Status: DC

## 2014-12-10 NOTE — Progress Notes (Signed)
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 Patient did make some adjustments in his medications as he has done in the past. He states the clonazepam he now takes it all at night and typically is been taking 0.75 mg at bedtime and then if he needs more he takes and additional 0.125. He indicates he would like to stop the clonazepam. I indicated this would be a result plan. However I did state I would want him to gradually come off of it. I've indicated what we will do is take 0.5 mg for 5 nights and then go to 0.25 mg for 5 nights and then discontinue it. I've instructed him to follow directions on his bottle.  Stick Wellbutrin SR 100 mg daily as Lexapro 5 mg daily. He is asked about other medications for sleep to use instead of the clonazepam. I discussed trazodone with him. He states he takes a another natural sleep agent 5HTP. I've instructed him to discontinue that natural sleep agent while we assess the trazodone.  Asian states otherwise things are going fairly well for him. Chief Complaint:  Chief Complaint    Follow-up; Depression     Visit Diagnosis:  No diagnosis found.  Past Medical History:  Past Medical History  Diagnosis Date  . GERD (gastroesophageal reflux disease)   . Osteoarthritis     in neck and left knee  . Bipolar disorder     hospitalized in past  . History of kidney stones     30 years ago  . Basal cell cancer 2008    on shoulder  . Macular degeneration   . Atrial fibrillation   . CVA (cerebral vascular accident) 8/11  . Anxiety   . Generalized anxiety disorder     Past Surgical History  Procedure Laterality Date  . Back surgery  1991   Family History:  Family History  Problem Relation Age of Onset  . Stroke Father   . Cancer Brother     prostate  . Coronary artery disease Brother   . Hypertension Brother     Social History:  Social History   Social History  . Marital Status: Married    Spouse Name: N/A  . Number of Children: N/A  . Years of Education: N/A   Occupational History  . retired     used to work on farm, Press photographer, Cabin crew   Social History Main Topics  . Smoking status: Never Smoker   . Smokeless tobacco: Never Used  . Alcohol Use: No  . Drug Use: No  . Sexual Activity: Yes    Birth Control/ Protection: None   Other Topics Concern  . None   Social History Narrative   Moved from [place] 3/10   Walks 30-40 minutes daily   Additional History:   Assessment:   Musculoskeletal: Strength & Muscle Tone: within normal limits Gait & Station: normal Patient leans: N/A  Psychiatric Specialty Exam: Depression        Associated symptoms include insomnia.  Associated symptoms include no suicidal ideas.   Review of Systems  Psychiatric/Behavioral: Negative for depression, suicidal ideas, hallucinations, memory loss and substance abuse. The patient has insomnia. The patient is not nervous/anxious.     Blood pressure 110/62, pulse 94, temperature 98.6 F (37 C), temperature source Tympanic, height 5\' 6"  (1.676 m), weight 150 lb (68.04 kg), SpO2 94 %.Body  mass index is 24.22 kg/(m^2).  General Appearance: Well Groomed  Eye Contact:  Good  Speech:  Normal Rate  Volume:  Normal  Mood:  Euphoric  Affect:  Appropriate and Full Range  Thought Process:  Linear and Logical  Orientation:  Full (Time, Place, and Person)  Thought Content:  Negative  Suicidal Thoughts:  No  Homicidal Thoughts:  No  Memory:  Immediate;   Good Recent;   Good Remote;   Good  Judgement:  Good  Insight:  Good  Psychomotor Activity:  Normal  Concentration:  Good  Recall:  Good  Fund of Knowledge: Good  Language: Good  Akathisia:  Negative  Handed:  Right unknown  AIMS (if indicated):  No done  Assets:  Communication Skills Desire for Improvement  ADL's:  Intact  Cognition: WNL   Sleep:  good   Is the patient at risk to self?  No. Has the patient been a risk to self in the past 6 months?  No. Has the patient been a risk to self within the distant past?  No. Is the patient a risk to others?  No. Has the patient been a risk to others in the past 6 months?  No. Has the patient been a risk to others within the distant past?  No.  Current Medications: Current Outpatient Prescriptions  Medication Sig Dispense Refill  . 5-Hydroxytryptophan (5-HTP) 100 MG CAPS Take 200 mg by mouth at bedtime as needed.    . Ascorbic Acid (VITAMIN C) 500 MG tablet Take 500 mg by mouth 4 (four) times daily.      . B Complex Vitamins (B COMPLEX PO) Take by mouth 2 (two) times daily.     [name] Kitchen buPROPion (WELLBUTRIN SR) 100 MG 12 hr tablet Take 1 tablet (100 mg total) by mouth daily. 90 tablet 0  . Cholecalciferol (VITAMIN D-3) 5000 UNITS TABS Take 1 tablet by mouth 2 (two) times daily.    . clonazePAM (KLONOPIN) 0.5 MG tablet Take one tablet at bedtime for 5 days, and then take one half a tablet for 5 days then stop this medication. 8 tablet 0  . Cyanocobalamin (VITAMIN B-12) 2500 MCG SUBL Take 2 tabs by mouth daily     . escitalopram (LEXAPRO) 5 MG tablet Take 1 tablet (5 mg total) by mouth daily. 90 tablet 0  . Ginkgo Biloba (GINKOBA PO) Take by mouth 3 (three) times daily.      . Glucosamine-Chondroitin (GLUCOSAMINE CHONDR COMPLEX PO) Take by mouth 2 (two) times daily.      [name] Kitchen L-Lysine 500 MG TABS Take by mouth.    . Lutein 6 MG CAPS Take by mouth.    [name] Kitchen NATURE-THROID 48.75 MG TABS   0  . NON FORMULARY Lithium Orotate 130mg -take twice daily     . potassium chloride (K-DUR,KLOR-CON) 10 MEQ tablet     . potassium chloride (MICRO-K) 10 MEQ CR capsule     . pyridOXINE (VITAMIN B-6) 25 MG tablet Take by mouth.    . Saw Palmetto 450 MG CAPS Take 1 capsule by mouth 2 (two) times daily.     [name] Kitchen selenium sulfide (SELSUN) 2.5 % shampoo Apply topically once a week. Apply to rash on chest and leave on  overnight. Wash off in the morning 118 mL 3  . Taurine 500 MG CAPS Take by mouth 2 (two) times daily.      . Thyroid 325 MG TABS Take 1 tablet by mouth daily.    [name] Kitchen triamcinolone lotion (  KENALOG) 0.1 % Apply topically 2 (two) times daily as needed. For itchy rash 60 mL 1  . Tyrosine 500 MG CAPS Take by mouth 2 (two) times daily.      [name] Kitchen warfarin (COUMADIN) 2.5 MG tablet     . warfarin (COUMADIN) 5 MG tablet Use as directed by anticoagulation clinic 90 tablet 1  . traZODone (DESYREL) 50 MG tablet 1/2 tablet at bedtime as needed. If needed may take one whole tablet as needed. 30 tablet 1   No current facility-administered medications for this visit.    Medical Decision Making:  Established Problem, Stable/Improving (1)  Treatment Plan Summary:Medication management We will changes in medications per patient's request. However will want to follow up with him in 1 month to observe for any changes in mood. Patient will continue his Wellbutrin SR 100 mg daily, Lexapro 5 mg daily. He will start some trazodone 25 mg at bedtime and if that does not work he can take 50 mg at bedtime. Risk and benefits of trazodone including priapism have  been discussed and patient able to consent. Patient will follow up in 1 month. He has been encouraged call any questions or concerns prior to his next point.  [place] 90 day supplies of his Wellbutrin SR and Lexapro. He will fill the trazodone at the local pharmacy as we are assessing his response to this medication. Faith Rogue 12/10/2014, 4:07 PM

## 2014-12-11 ENCOUNTER — Ambulatory Visit: Payer: Commercial Managed Care - HMO | Admitting: Psychiatry

## 2014-12-17 ENCOUNTER — Ambulatory Visit
Admission: RE | Admit: 2014-12-17 | Discharge: 2014-12-17 | Disposition: A | Discharge status: Home / Self Care | Payer: Commercial Managed Care - HMO | Source: Ambulatory Visit | Attending: Family Medicine | Admitting: Family Medicine

## 2014-12-17 ENCOUNTER — Ambulatory Visit: Payer: Commercial Managed Care - HMO

## 2014-12-17 DIAGNOSIS — M7552 Bursitis of left shoulder: Secondary | ICD-10-CM | POA: Insufficient documentation

## 2014-12-17 DIAGNOSIS — M25512 Pain in left shoulder: Secondary | ICD-10-CM

## 2014-12-17 DIAGNOSIS — M75102 Unspecified rotator cuff tear or rupture of left shoulder, not specified as traumatic: Secondary | ICD-10-CM | POA: Insufficient documentation

## 2014-12-17 DIAGNOSIS — G8929 Other chronic pain: Secondary | ICD-10-CM | POA: Diagnosis present

## 2014-12-18 ENCOUNTER — Telehealth: Payer: Self-pay | Admitting: Family Medicine

## 2014-12-18 DIAGNOSIS — M25512 Pain in left shoulder: Secondary | ICD-10-CM

## 2014-12-18 NOTE — Telephone Encounter (Signed)
Scheduled 01/06/15 @ 10am with Cameron Proud, PA for Dr. Roland Rack. Pt aware of appt

## 2014-12-18 NOTE — Telephone Encounter (Signed)
Will route to [facility]

## 2014-12-18 NOTE — Telephone Encounter (Signed)
-----   Message from Tammi Sou, [place] sent at 12/18/2014 12:44 PM EDT ----- Pt came up to office, I gave him a copy of results and advise him of Dr. Marliss Coots comments. Pt agrees with referral if possible he would like to go to [place] because he goes to [facility], pt advise Dr. Glori Bickers will put referral in and our [facility] will call to schedule appt.  Pt also gave me a note to give to Dr. Glori Bickers. Note just requested Dr. Glori Bickers to give pt a Rx for Trazodone 50mg  to help sleep but looking at med list psychiatrist fills Rx and recently filled Rx on 12/10/14

## 2014-12-30 ENCOUNTER — Ambulatory Visit (INDEPENDENT_AMBULATORY_CARE_PROVIDER_SITE_OTHER): Payer: Commercial Managed Care - HMO | Admitting: Psychiatry

## 2014-12-30 ENCOUNTER — Encounter: Payer: Self-pay | Admitting: Psychiatry

## 2014-12-30 VITALS — BP 108/72 | HR 97 | Temp 97.9°F | Ht 66.0 in | Wt 147.2 lb

## 2014-12-30 DIAGNOSIS — F3176 Bipolar disorder, in full remission, most recent episode depressed: Secondary | ICD-10-CM

## 2014-12-30 DIAGNOSIS — F411 Generalized anxiety disorder: Secondary | ICD-10-CM | POA: Diagnosis not present

## 2014-12-30 NOTE — Progress Notes (Addendum)
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 Patient indicates his mood has been stable. He relates that the trazodone caused the opposite effect and kept him up. He states he continues on the citalopram and the Wellbutrin which been helpful for his mood. He states his appetite is good. He states regards to his insomnia we will return to his natural supplement that he was using HTP. He states he does enjoy things but he does not get out to do much. He states that there is an event for the elderly taking a trip up to a music event. He states that he does believe there are more activities he would engage in them more frequently. He denies feeling depressed but simply is an engaging in things but as discussed above it may be that he does not have many activities available. Chief Complaint: Trazodone had the opposite effect Chief Complaint    Follow-up; Medication Problem     Visit Diagnosis:  No diagnosis found.  Past Medical History:  Past Medical History  Diagnosis Date  . GERD (gastroesophageal reflux disease)   . Osteoarthritis     in neck and left knee  . Bipolar disorder     hospitalized in past  . History of kidney stones     30 years ago  . Basal cell cancer 2008    on shoulder  . Macular degeneration   . Atrial fibrillation   . CVA (cerebral vascular accident) 8/11  . Anxiety   . Generalized anxiety disorder     Past Surgical History  Procedure Laterality Date  . Back surgery  1991   Family History:  Family History  Problem Relation Age of Onset  . Stroke Father   . Cancer Brother     prostate  . Coronary artery disease Brother   . Hypertension Brother    Social History:  Social History   Social History  . Marital Status: Married    Spouse Name: N/A  . Number of Children: N/A  . Years of Education: N/A   Occupational  History  . retired     used to work on farm, Press photographer, Cabin crew   Social History Main Topics  . Smoking status: Never Smoker   . Smokeless tobacco: Never Used  . Alcohol Use: No  . Drug Use: No  . Sexual Activity: Yes    Birth Control/ Protection: None   Other Topics Concern  . None   Social History Narrative   Moved from [place] 3/10   Walks 30-40 minutes daily   Additional History:   Assessment:   Musculoskeletal: Strength & Muscle Tone: within normal limits Gait & Station: normal Patient leans: N/A  Psychiatric Specialty Exam: Depression        Associated symptoms include insomnia.  Associated symptoms include no suicidal ideas.   Review of Systems  Psychiatric/Behavioral: Negative for depression, suicidal ideas, hallucinations, memory loss and substance abuse. The patient has insomnia. The patient is not nervous/anxious.     Blood pressure 108/72, pulse 97, temperature 97.9 F (36.6 C), temperature source Tympanic, height 5\' 6"  (1.676 m), weight 147 lb 3.2 oz (66.769 kg), SpO2 96 %.Body mass index is 23.77 kg/(m^2).  General Appearance: Well Groomed  Eye Contact:  Good  Speech:  Normal Rate  Volume:  Normal  Mood:  Euphoric  Affect:  Appropriate and Full Range  Thought Process:  Linear and Logical  Orientation:  Full (Time, Place, and Person)  Thought Content:  Negative  Suicidal Thoughts:  No  Homicidal Thoughts:  No  Memory:  Immediate;   Good Recent;   Good Remote;   Good  Judgement:  Good  Insight:  Good  Psychomotor Activity:  Normal  Concentration:  Good  Recall:  Good  Fund of Knowledge: Good  Language: Good  Akathisia:  Negative  Handed:  Right unknown  AIMS (if indicated):  No done  Assets:  Communication Skills Desire for Improvement  ADL's:  Intact  Cognition: WNL  Sleep:  good   Is the patient at risk to self?  No. Has the patient been a risk to self in the past 6 months?  No. Has the patient been a risk to self within the distant  past?  No. Is the patient a risk to others?  No. Has the patient been a risk to others in the past 6 months?  No. Has the patient been a risk to others within the distant past?  No.  Current Medications: Current Outpatient Prescriptions  Medication Sig Dispense Refill  . 5-Hydroxytryptophan (5-HTP) 100 MG CAPS Take 200 mg by mouth at bedtime as needed.    . Ascorbic Acid (VITAMIN C) 500 MG tablet Take 500 mg by mouth 4 (four) times daily.      . B Complex Vitamins (B COMPLEX PO) Take by mouth 2 (two) times daily.     [name] Kitchen buPROPion (WELLBUTRIN SR) 100 MG 12 hr tablet Take 1 tablet (100 mg total) by mouth daily. 90 tablet 0  . Cholecalciferol (VITAMIN D-3) 5000 UNITS TABS Take 1 tablet by mouth 2 (two) times daily.    . clonazePAM (KLONOPIN) 0.5 MG tablet Take one tablet at bedtime for 5 days, and then take one half a tablet for 5 days then stop this medication. 8 tablet 0  . Cyanocobalamin (VITAMIN B-12) 2500 MCG SUBL Take 2 tabs by mouth daily     . escitalopram (LEXAPRO) 5 MG tablet Take 1 tablet (5 mg total) by mouth daily. 90 tablet 0  . Ginkgo Biloba (GINKOBA PO) Take by mouth 3 (three) times daily.      . Glucosamine-Chondroitin (GLUCOSAMINE CHONDR COMPLEX PO) Take by mouth 2 (two) times daily.      [name] Kitchen L-Lysine 500 MG TABS Take by mouth.    . Lutein 6 MG CAPS Take by mouth.    [name] Kitchen NATURE-THROID 48.75 MG TABS   0  . NON FORMULARY Lithium Orotate 130mg -take twice daily     . potassium chloride (K-DUR,KLOR-CON) 10 MEQ tablet     . potassium chloride (MICRO-K) 10 MEQ CR capsule     . pyridOXINE (VITAMIN B-6) 25 MG tablet Take by mouth.    . Saw Palmetto 450 MG CAPS Take 1 capsule by mouth 2 (two) times daily.     [name] Kitchen selenium sulfide (SELSUN) 2.5 % shampoo Apply topically once a week. Apply to rash on chest and leave on overnight. Wash off in the morning 118 mL 3  . Taurine 500 MG CAPS Take by mouth 2 (two) times daily.      . Thyroid 325 MG TABS Take 1 tablet by mouth daily.    [name] Kitchen  triamcinolone lotion (KENALOG) 0.1 % Apply topically 2 (two) times daily as needed. For itchy rash 60 mL 1  . Tyrosine 500 MG CAPS Take by mouth 2 (two) times daily.      [name] Kitchen  warfarin (COUMADIN) 2.5 MG tablet     . warfarin (COUMADIN) 5 MG tablet Use as directed by anticoagulation clinic 90 tablet 1   No current facility-administered medications for this visit.    Medical Decision Making:  Established Problem, Stable/Improving (1)  Treatment Plan Summary:Medication management We will changes in medications per patient's request. However will want to follow up with him in 1 month to observe for any changes in mood. Patient will continue his Wellbutrin SR 100 mg daily, Lexapro 5 mg daily. We will discontinue the trazodone as he reported it caused insomnia. I did discuss perhaps trying some other medication such as some Vistaril however patient prefers to use his natural supplement.  He received 90 day supplies of his Wellbutrin SR and Lexapro at his August 30 appointment.  Appears patient could utilize additional social activities and appears to have some interest in engaging in them if they are available. Discussed the case with therapist in the office and we will research any opportunities and might be suitable and contact patient.   Faith Rogue 12/30/2014, 1:20 PM

## 2015-01-10 ENCOUNTER — Ambulatory Visit: Payer: Self-pay | Admitting: Psychiatry

## 2015-01-21 ENCOUNTER — Ambulatory Visit (INDEPENDENT_AMBULATORY_CARE_PROVIDER_SITE_OTHER): Payer: Commercial Managed Care - HMO | Admitting: Family Medicine

## 2015-01-21 ENCOUNTER — Encounter: Payer: Self-pay | Admitting: Family Medicine

## 2015-01-21 VITALS — BP 106/80 | HR 87 | Temp 97.9°F | Ht 66.5 in | Wt 143.5 lb

## 2015-01-21 DIAGNOSIS — M542 Cervicalgia: Secondary | ICD-10-CM

## 2015-01-21 DIAGNOSIS — M25512 Pain in left shoulder: Secondary | ICD-10-CM

## 2015-01-21 NOTE — Progress Notes (Signed)
Pre visit review using our clinic review tool, if applicable. No additional management support is needed unless otherwise documented below in the visit note. 

## 2015-01-21 NOTE — Assessment & Plan Note (Signed)
With bursitis and small rot cuff tear on MRI Much imp after injection with orthpedics He would like to work on rom and strength of both shoulders  Ref to PT

## 2015-01-21 NOTE — Progress Notes (Signed)
Subjective:    Patient ID: David Duncan, male    DOB: [birth year], 81 y.o.   MRN: [id number]  HPI Here for f/u   Had a shot in is shoulder - from [facility]  It really helped  MRI showed bursitis and hypertrophic AC joint - causing impingement  Shot helped Tiny focal tear in distal supraspinatous   Still goes to massage tx    Needs PT ref - for neck (chronic)  Also both shoulders -still some pain on top (worse on L)     Sees psychiatry  Tried trazadone for sleep- made it worse so he stopped it  Takes 5-HTP   Patient Active Problem List   Diagnosis Date Noted  . Left shoulder pain 12/06/2014  . Cardiomyopathy, dilated ([place]) 09/16/2014  . Dyslipidemia 09/16/2014  . Cerebral vascular accident ([place]) 09/16/2014  . Heart valve disease 09/16/2014  . Chest pain 08/08/2014  . MI (mitral incompetence) 08/08/2014  . TI (tricuspid incompetence) 08/08/2014  . AF (paroxysmal atrial fibrillation) ([place]) 02/15/2014  . Atrial fibrillation, chronic ([place]) 01/16/2014  . Chronic systolic heart failure ([place]) 01/16/2014  . Breathlessness on exertion 12/19/2013  . Bunion, right foot 08/17/2013  . Macular degeneration 10/16/2012  . Encounter for Medicare annual wellness exam 08/16/2012  . Neck pain 02/19/2011  . Hearing decreased 12/16/2010  . Medication adverse effect 07/28/2010  . Prostate cancer screening 07/28/2010  . Long term (current) use of anticoagulants 07/22/2010  . HYPERLIPIDEMIA-MIXED 06/01/2010  . ATRIAL FIBRILLATION 11/28/2009  . ATRIAL FLUTTER 11/28/2009  . CVA 11/28/2009  . BIPOLAR AFFECTIVE DISORDER 02/20/2009   Past Medical History  Diagnosis Date  . GERD (gastroesophageal reflux disease)   . Osteoarthritis     in neck and left knee  . Bipolar disorder ([place])     hospitalized in past  . History of kidney stones     30 years ago  . Basal cell cancer 2008    on shoulder  . Macular degeneration   . Atrial fibrillation ([place])   . CVA (cerebral vascular  accident) ([place]) 8/11  . Anxiety   . Generalized anxiety disorder    Past Surgical History  Procedure Laterality Date  . Back surgery  1991   Social History  Substance Use Topics  . Smoking status: Never Smoker   . Smokeless tobacco: Never Used  . Alcohol Use: No   Family History  Problem Relation Age of Onset  . Stroke Father   . Cancer Brother     prostate  . Coronary artery disease Brother   . Hypertension Brother    No Known Allergies Current Outpatient Prescriptions on File Prior to Visit  Medication Sig Dispense Refill  . 5-Hydroxytryptophan (5-HTP) 100 MG CAPS Take 200 mg by mouth at bedtime as needed.    . Ascorbic Acid (VITAMIN C) 500 MG tablet Take 500 mg by mouth 4 (four) times daily.      . B Complex Vitamins (B COMPLEX PO) Take by mouth 2 (two) times daily.     . Cholecalciferol (VITAMIN D-3) 5000 UNITS TABS Take 1 tablet by mouth 2 (two) times daily.    . Cyanocobalamin (VITAMIN B-12) 2500 MCG SUBL Take 2 tabs by mouth daily     . Ginkgo Biloba (GINKOBA PO) Take by mouth 3 (three) times daily.      . Glucosamine-Chondroitin (GLUCOSAMINE CHONDR COMPLEX PO) Take by mouth 2 (two) times daily.      [name] Kitchen L-Lysine 500 MG TABS Take by mouth.    [name] Kitchen  Lutein 6 MG CAPS Take by mouth.    [name] Kitchen NATURE-THROID 48.75 MG TABS   0  . NON FORMULARY Lithium Orotate 130mg -take twice daily     . potassium chloride (K-DUR,KLOR-CON) 10 MEQ tablet     . potassium chloride (MICRO-K) 10 MEQ CR capsule     . pyridOXINE (VITAMIN B-6) 25 MG tablet Take by mouth.    . Saw Palmetto 450 MG CAPS Take 1 capsule by mouth 2 (two) times daily.     [name] Kitchen selenium sulfide (SELSUN) 2.5 % shampoo Apply topically once a week. Apply to rash on chest and leave on overnight. Wash off in the morning 118 mL 3  . Taurine 500 MG CAPS Take by mouth 2 (two) times daily.      . Thyroid 325 MG TABS Take 1 tablet by mouth daily.    [name] Kitchen triamcinolone lotion (KENALOG) 0.1 % Apply topically 2 (two) times daily as needed. For itchy  rash 60 mL 1  . Tyrosine 500 MG CAPS Take by mouth 2 (two) times daily.      [name] Kitchen warfarin (COUMADIN) 2.5 MG tablet     . warfarin (COUMADIN) 5 MG tablet Use as directed by anticoagulation clinic 90 tablet 1   No current facility-administered medications on file prior to visit.     Review of Systems Review of Systems  Constitutional: Negative for fever, appetite change, fatigue and unexpected weight change.  Eyes: Negative for pain and visual disturbance.  Respiratory: Negative for cough and shortness of breath.   Cardiovascular: Negative for cp or palpitations    Gastrointestinal: Negative for nausea, diarrhea and constipation.  Genitourinary: Negative for urgency and frequency.  Skin: Negative for pallor or rash   MSK pos for neck and shoulder pain , neg for joint swelling  Neurological: Negative for weakness, light-headedness, numbness and headaches.  Hematological: Negative for adenopathy. Does not bruise/bleed easily.  Psychiatric/Behavioral: Negative for dysphoric mood. The patient is not nervous/anxious.         Objective:   Physical Exam  Constitutional: He appears well-developed and well-nourished. No distress.  Eyes: Conjunctivae and EOM are normal. Pupils are equal, round, and reactive to light.  Neck: Normal range of motion. Neck supple.  Cardiovascular: Normal rate and regular rhythm.   Pulmonary/Chest: Effort normal and breath sounds normal.  Musculoskeletal: He exhibits tenderness. He exhibits no edema.  L shoulder- mild acromion tenderness Nl rom  Mildly pos hawking maneuver  Overall improved  R shoulder-nl rom with some pain with abd past 90 deg  Neck-some crepitus and baseline limited extension  No changes    Lymphadenopathy:    He has no cervical adenopathy.  Neurological: He is alert. He has normal reflexes. He displays no atrophy. He exhibits normal muscle tone.  Skin: Skin is warm and dry. No rash noted. No erythema.  Psychiatric: He has a normal mood  and affect.          Assessment & Plan:   Problem List Items Addressed This Visit      Other   Left shoulder pain    With bursitis and small rot cuff tear on MRI Much imp after injection with orthpedics He would like to work on rom and strength of both shoulders  Ref to PT      Relevant Orders   Ambulatory referral to Physical Therapy   Neck pain - Primary    Chronic Desires ref to PT which helps  Ref made  Relevant Orders   Ambulatory referral to Physical Therapy

## 2015-01-21 NOTE — Assessment & Plan Note (Signed)
Chronic Desires ref to PT which helps  Ref made

## 2015-01-21 NOTE — Patient Instructions (Signed)
Please stop up front on the way out  You will get a call about the PT referral  Take care of yourself  I'm glad the shoulder is doing better

## 2015-02-02 DIAGNOSIS — M4802 Spinal stenosis, cervical region: Secondary | ICD-10-CM | POA: Insufficient documentation

## 2015-02-05 ENCOUNTER — Telehealth: Payer: Self-pay | Admitting: Family Medicine

## 2015-02-05 DIAGNOSIS — M4802 Spinal stenosis, cervical region: Secondary | ICD-10-CM | POA: Insufficient documentation

## 2015-02-05 NOTE — Telephone Encounter (Signed)
neurosurg ref at req of [facility]  Pt has cervical stenosis

## 2015-02-05 NOTE — Telephone Encounter (Signed)
Referral faxed to [facility]. Waiting for appt to be made. They will call pt to sch. Pt aware

## 2015-03-10 NOTE — Progress Notes (Signed)
According to notes on  12-10-14 pt was discontinue medication

## 2015-03-10 NOTE — Progress Notes (Signed)
According to notes on  11-29-14 pt is continuing this medications.

## 2015-03-18 ENCOUNTER — Ambulatory Visit (INDEPENDENT_AMBULATORY_CARE_PROVIDER_SITE_OTHER): Payer: Commercial Managed Care - HMO | Admitting: Psychiatry

## 2015-03-18 ENCOUNTER — Encounter: Payer: Self-pay | Admitting: Psychiatry

## 2015-03-18 VITALS — BP 130/88 | HR 118 | Temp 97.3°F | Ht 66.5 in | Wt 156.4 lb

## 2015-03-18 DIAGNOSIS — F3176 Bipolar disorder, in full remission, most recent episode depressed: Secondary | ICD-10-CM | POA: Diagnosis not present

## 2015-03-18 DIAGNOSIS — I639 Cerebral infarction, unspecified: Secondary | ICD-10-CM | POA: Insufficient documentation

## 2015-03-18 DIAGNOSIS — F411 Generalized anxiety disorder: Secondary | ICD-10-CM | POA: Diagnosis not present

## 2015-03-18 MED ORDER — ESCITALOPRAM OXALATE 10 MG PO TABS: 10.0000 mg | ORAL_TABLET | ORAL | Status: DC

## 2015-03-18 MED ORDER — BUPROPION HCL ER (SR) 200 MG PO TB12: 200.0000 mg | ORAL_TABLET | ORAL | Status: DC

## 2015-03-18 NOTE — Progress Notes (Signed)
[facility] MD/PA/NP OP Progress Note  03/18/2015 1:18 PM David Duncan  MRN:  [id number]  Subjective:  Patient returns for follow-up of his polar disorder and generalized anxiety disorder. He states that about a month ago he was feeling depressed and so he doubled both his medications. He states set of taking a butcher SR 100 mg daily started taking 200 mg daily. He states that his Lexapro instead of taking his prescribed 5 mg daily started taking 10 mg daily. Asked if he had any other neurovegetative signs of been feeling depressed and he stated there were any issues with his appetite or sleep. He states that the change helped. I did spend some time discussing with him that perhaps in the future he should contact his provider to discuss dose changes so that he does not do something that is harmful. Did reflect that he had been on these doses in the past and this is why he felt comfortable making the changes.  He states he has an upcoming [place] products needing later this week which will require a day trip. He states then for the holidays he has wife will be going to see his daughter in [place]. Chief Complaint: Want my medication "stronger" Chief Complaint    Follow-up; Medication Refill     Visit Diagnosis:     ICD-9-CM ICD-10-CM   1. Bipolar disorder, in full remission, most recent episode depressed ([place]) 296.56 F31.76   2. GAD (generalized anxiety disorder) 300.02 F41.1     Past Medical History:  Past Medical History  Diagnosis Date  . GERD (gastroesophageal reflux disease)   . Osteoarthritis     in neck and left knee  . Bipolar disorder ([place])     hospitalized in past  . History of kidney stones     30 years ago  . Basal cell cancer 2008    on shoulder  . Macular degeneration   . Atrial fibrillation ([place])   . CVA (cerebral vascular accident) ([place]) 8/11  . Anxiety   . Generalized anxiety disorder     Past Surgical History  Procedure Laterality Date  . Back surgery  1991    Family History:  Family History  Problem Relation Age of Onset  . Stroke Father   . Cancer Brother     prostate  . Coronary artery disease Brother   . Hypertension Brother    Social History:  Social History   Social History  . Marital Status: Married    Spouse Name: N/A  . Number of Children: N/A  . Years of Education: N/A   Occupational History  . retired     used to work on farm, Press photographer, Cabin crew   Social History Main Topics  . Smoking status: Never Smoker   . Smokeless tobacco: Never Used  . Alcohol Use: No  . Drug Use: No  . Sexual Activity: Yes    Birth Control/ Protection: None   Other Topics Concern  . None   Social History Narrative   Moved from [place] 3/10   Walks 30-40 minutes daily   Additional History:   Assessment:    Musculoskeletal: Strength & Muscle Tone: within normal limits Gait & Station: normal Patient leans: N/A  Psychiatric Specialty Exam: Depression        Associated symptoms include does not have insomnia and no suicidal ideas.   Review of Systems  Psychiatric/Behavioral: Negative for depression, suicidal ideas, hallucinations, memory loss and substance abuse. The patient is not nervous/anxious and does  not have insomnia.   All other systems reviewed and are negative.   Blood pressure 130/88, pulse 118, temperature 97.3 F (36.3 C), temperature source Tympanic, height 5' 6.5" (1.689 m), weight 156 lb 6.4 oz (70.943 kg), SpO2 98 %.Body mass index is 24.87 kg/(m^2).  General Appearance: Well Groomed  Eye Contact:  Good  Speech:  Normal Rate  Volume:  Normal  Mood:  Good  Affect:  Appropriate and Full Range  Thought Process:  Linear and Logical  Orientation:  Full (Time, Place, and Person)  Thought Content:  Negative  Suicidal Thoughts:  No  Homicidal Thoughts:  No  Memory:  Immediate;   Good Recent;   Good Remote;   Good  Judgement:  Good  Insight:  Good  Psychomotor Activity:  Normal  Concentration:  Good   Recall:  Good  Fund of Knowledge: Good  Language: Good  Akathisia:  Negative  Handed:  Right unknown  AIMS (if indicated):  No done  Assets:  Communication Skills Desire for Improvement  ADL's:  Intact  Cognition: WNL  Sleep:  good   Is the patient at risk to self?  No. Has the patient been a risk to self in the past 6 months?  No. Has the patient been a risk to self within the distant past?  No. Is the patient a risk to others?  No. Has the patient been a risk to others in the past 6 months?  No. Has the patient been a risk to others within the distant past?  No.  Current Medications: Current Outpatient Prescriptions  Medication Sig Dispense Refill  . 5-Hydroxytryptophan (5-HTP) 100 MG CAPS Take 200 mg by mouth at bedtime as needed.    . Ascorbic Acid (VITAMIN C) 500 MG tablet Take 500 mg by mouth 4 (four) times daily.      . B Complex Vitamins (B COMPLEX PO) Take by mouth 2 (two) times daily.     . Cholecalciferol (VITAMIN D-3) 5000 UNITS TABS Take 1 tablet by mouth 2 (two) times daily.    . Cyanocobalamin (VITAMIN B-12) 2500 MCG SUBL Take 2 tabs by mouth daily     . Ginkgo Biloba (GINKOBA PO) Take by mouth 3 (three) times daily.      . Glucosamine-Chondroitin (GLUCOSAMINE CHONDR COMPLEX PO) Take by mouth 2 (two) times daily.      [name] Kitchen L-Lysine 500 MG TABS Take by mouth.    . Lutein 6 MG CAPS Take by mouth.    [name] Kitchen NATURE-THROID 48.75 MG TABS   0  . NON FORMULARY Lithium Orotate 130mg -take twice daily     . potassium chloride (K-DUR,KLOR-CON) 10 MEQ tablet     . potassium chloride (MICRO-K) 10 MEQ CR capsule     . pyridOXINE (VITAMIN B-6) 25 MG tablet Take by mouth.    . Saw Palmetto 450 MG CAPS Take 1 capsule by mouth 2 (two) times daily.     [name] Kitchen selenium sulfide (SELSUN) 2.5 % shampoo Apply topically once a week. Apply to rash on chest and leave on overnight. Wash off in the morning 118 mL 3  . Taurine 500 MG CAPS Take by mouth 2 (two) times daily.      . Thyroid 325 MG TABS Take  1 tablet by mouth daily.    [name] Kitchen triamcinolone lotion (KENALOG) 0.1 % Apply topically 2 (two) times daily as needed. For itchy rash 60 mL 1  . Tyrosine 500 MG CAPS Take by mouth 2 (two) times daily.      [name] Kitchen  warfarin (COUMADIN) 2.5 MG tablet     . warfarin (COUMADIN) 5 MG tablet Use as directed by anticoagulation clinic 90 tablet 1  . buPROPion (WELLBUTRIN SR) 200 MG 12 hr tablet Take 1 tablet (200 mg total) by mouth every morning. 90 tablet 0  . escitalopram (LEXAPRO) 10 MG tablet Take 1 tablet (10 mg total) by mouth every morning. 90 tablet 0   No current facility-administered medications for this visit.    Medical Decision Making:  Established Problem, Stable/Improving (1)  Treatment Plan Summary:Medication management Bipolar disorder most recent episode depressed-as discussed above the patient has made an increase in his Wellbutrin SR to 200 mg daily and Lexapro to 10 mg daily. He reports good mood on these medications.  I explained him my departure in February. I explained he would be transferred to another provider within this clinic.  He will follow up in 3 months. He's been encouraged to call the clinic with any questions or concerns prior to his next appointment.    Faith Rogue 03/18/2015, 1:18 PM

## 2015-03-31 ENCOUNTER — Ambulatory Visit: Payer: Commercial Managed Care - HMO | Admitting: Psychiatry

## 2015-04-24 DIAGNOSIS — M7581 Other shoulder lesions, right shoulder: Secondary | ICD-10-CM | POA: Diagnosis not present

## 2015-04-24 DIAGNOSIS — M75112 Incomplete rotator cuff tear or rupture of left shoulder, not specified as traumatic: Secondary | ICD-10-CM | POA: Diagnosis not present

## 2015-06-04 DIAGNOSIS — L570 Actinic keratosis: Secondary | ICD-10-CM | POA: Diagnosis not present

## 2015-06-04 DIAGNOSIS — D225 Melanocytic nevi of trunk: Secondary | ICD-10-CM | POA: Diagnosis not present

## 2015-06-04 DIAGNOSIS — D2261 Melanocytic nevi of right upper limb, including shoulder: Secondary | ICD-10-CM | POA: Diagnosis not present

## 2015-06-04 DIAGNOSIS — D485 Neoplasm of uncertain behavior of skin: Secondary | ICD-10-CM | POA: Diagnosis not present

## 2015-06-04 DIAGNOSIS — C4442 Squamous cell carcinoma of skin of scalp and neck: Secondary | ICD-10-CM | POA: Diagnosis not present

## 2015-06-04 DIAGNOSIS — Z85828 Personal history of other malignant neoplasm of skin: Secondary | ICD-10-CM | POA: Diagnosis not present

## 2015-06-04 DIAGNOSIS — D2272 Melanocytic nevi of left lower limb, including hip: Secondary | ICD-10-CM | POA: Diagnosis not present

## 2015-06-24 DIAGNOSIS — I5022 Chronic systolic (congestive) heart failure: Secondary | ICD-10-CM | POA: Diagnosis not present

## 2015-06-24 DIAGNOSIS — I482 Chronic atrial fibrillation: Secondary | ICD-10-CM | POA: Diagnosis not present

## 2015-06-24 DIAGNOSIS — R0602 Shortness of breath: Secondary | ICD-10-CM | POA: Diagnosis not present

## 2015-07-07 DIAGNOSIS — I482 Chronic atrial fibrillation: Secondary | ICD-10-CM | POA: Diagnosis not present

## 2015-07-07 DIAGNOSIS — R0602 Shortness of breath: Secondary | ICD-10-CM | POA: Diagnosis not present

## 2015-07-07 DIAGNOSIS — I340 Nonrheumatic mitral (valve) insufficiency: Secondary | ICD-10-CM | POA: Diagnosis not present

## 2015-07-07 DIAGNOSIS — I5022 Chronic systolic (congestive) heart failure: Secondary | ICD-10-CM | POA: Diagnosis not present

## 2015-07-21 ENCOUNTER — Encounter: Payer: Self-pay | Admitting: Psychiatry

## 2015-07-21 ENCOUNTER — Ambulatory Visit (INDEPENDENT_AMBULATORY_CARE_PROVIDER_SITE_OTHER): Payer: PPO | Admitting: Psychiatry

## 2015-07-21 VITALS — BP 110/74 | HR 83 | Temp 97.6°F | Ht 66.5 in | Wt 155.8 lb

## 2015-07-21 DIAGNOSIS — F330 Major depressive disorder, recurrent, mild: Secondary | ICD-10-CM | POA: Diagnosis not present

## 2015-07-21 MED ORDER — ZALEPLON 5 MG PO CAPS: 5.0000 mg | ORAL_CAPSULE | Freq: Every evening | ORAL | Status: DC | PRN

## 2015-07-21 NOTE — Progress Notes (Signed)
[facility] MD/PA/NP OP Progress Note  07/21/2015 12:14 PM David Duncan  MRN:  [id number]  Subjective:  Patient is a 82 year old male who presented for the follow-up office bipolar disorder. He reported that he was following Dr. Jimmye Norman in the past. He reported that he is unable to sleep and has stopped taking his Wellbutrin. He stated that he is only taking Lexapro at this time. He stated that he feels that his mind is racing at night. He appeared anxious and apprehensive during the interview. He currently lives with his wife. He spends most of the time at home.[name] Kitchen He takes a lot of over-the-counter medications including vitamins and herbs. He reported that he has been started on thyroid hormone by his primary care physician. He currently denied having any perceptual disturbances he denied having any suicidal ideations or plans. He was asking for tranquilizer to help him sleep at night.   His wife keeps herself busy was selling [place]  products.    Chief Complaint: Want my medication "stronger" Chief Complaint    Anxiety; Follow-up     Visit Diagnosis:     ICD-9-CM ICD-10-CM   1. MDD (major depressive disorder), recurrent episode, mild ([facility]) 296.31 F33.0     Past Medical History:  Past Medical History  Diagnosis Date  . GERD (gastroesophageal reflux disease)   . Osteoarthritis     in neck and left knee  . Bipolar disorder ([place])     hospitalized in past  . History of kidney stones     30 years ago  . Basal cell cancer 2008    on shoulder  . Macular degeneration   . Atrial fibrillation ([place])   . CVA (cerebral vascular accident) ([place]) 8/11  . Anxiety   . Generalized anxiety disorder     Past Surgical History  Procedure Laterality Date  . Back surgery  1991   Family History:  Family History  Problem Relation Age of Onset  . Stroke Father   . Cancer Brother     prostate  . Coronary artery disease Brother   . Hypertension Brother    Social History:  Social History   Social  History  . Marital Status: Married    Spouse Name: N/A  . Number of Children: N/A  . Years of Education: N/A   Occupational History  . retired     used to work on farm, Press photographer, Cabin crew   Social History Main Topics  . Smoking status: Never Smoker   . Smokeless tobacco: Never Used  . Alcohol Use: No  . Drug Use: No  . Sexual Activity: Yes    Birth Control/ Protection: None   Other Topics Concern  . None   Social History Narrative   Moved from [place] 3/10   Walks 30-40 minutes daily   Additional History:   Assessment:    Musculoskeletal: Strength & Muscle Tone: within normal limits Gait & Station: normal Patient leans: N/A  Psychiatric Specialty Exam: Anxiety Patient reports no insomnia, nervous/anxious behavior or suicidal ideas.    Depression        Associated symptoms include does not have insomnia and no suicidal ideas.  Past medical history includes anxiety.     Review of Systems  Psychiatric/Behavioral: Negative for depression, suicidal ideas, hallucinations, memory loss and substance abuse. The patient is not nervous/anxious and does not have insomnia.   All other systems reviewed and are negative.   Blood pressure 110/74, pulse 83, temperature 97.6 F (36.4 C), temperature source  Tympanic, height 5' 6.5" (1.689 m), weight 155 lb 12.8 oz (70.67 kg), SpO2 94 %.Body mass index is 24.77 kg/(m^2).  General Appearance: Well Groomed  Eye Contact:  Good  Speech:  Normal Rate  Volume:  Normal  Mood:  Good  Affect:  Appropriate and Full Range  Thought Process:  Linear and Logical  Orientation:  Full (Time, Place, and Person)  Thought Content:  Negative  Suicidal Thoughts:  No  Homicidal Thoughts:  No  Memory:  Immediate;   Good Recent;   Good Remote;   Good  Judgement:  Good  Insight:  Good  Psychomotor Activity:  Normal  Concentration:  Good  Recall:  Good  Fund of Knowledge: Good  Language: Good  Akathisia:  Negative  Handed:  Right unknown   AIMS (if indicated):  No done  Assets:  Communication Skills Desire for Improvement  ADL's:  Intact  Cognition: WNL  Sleep:  good   Is the patient at risk to self?  No. Has the patient been a risk to self in the past 6 months?  No. Has the patient been a risk to self within the distant past?  No. Is the patient a risk to others?  No. Has the patient been a risk to others in the past 6 months?  No. Has the patient been a risk to others within the distant past?  No.  Current Medications: Current Outpatient Prescriptions  Medication Sig Dispense Refill  . 5-Hydroxytryptophan (5-HTP) 100 MG CAPS Take 200 mg by mouth at bedtime as needed.    . Ascorbic Acid (VITAMIN C) 500 MG tablet Take 500 mg by mouth 4 (four) times daily.      . B Complex Vitamins (B COMPLEX PO) Take by mouth 2 (two) times daily.     [name] Kitchen buPROPion (WELLBUTRIN SR) 200 MG 12 hr tablet Take 1 tablet (200 mg total) by mouth every morning. 90 tablet 0  . Cholecalciferol (VITAMIN D-3) 5000 UNITS TABS Take 1 tablet by mouth 2 (two) times daily.    . Cyanocobalamin (VITAMIN B-12) 2500 MCG SUBL Take 2 tabs by mouth daily     . escitalopram (LEXAPRO) 10 MG tablet Take 1 tablet (10 mg total) by mouth every morning. 90 tablet 0  . Ginkgo Biloba (GINKOBA PO) Take by mouth 3 (three) times daily.      . Glucosamine-Chondroitin (GLUCOSAMINE CHONDR COMPLEX PO) Take by mouth 2 (two) times daily.      [name] Kitchen L-Lysine 500 MG TABS Take by mouth.    . Lutein 6 MG CAPS Take by mouth.    [name] Kitchen NATURE-THROID 48.75 MG TABS   0  . NON FORMULARY Lithium Orotate 130mg -take twice daily     . potassium chloride (K-DUR,KLOR-CON) 10 MEQ tablet     . potassium chloride (MICRO-K) 10 MEQ CR capsule     . pyridOXINE (VITAMIN B-6) 25 MG tablet Take by mouth.    . Saw Palmetto 450 MG CAPS Take 1 capsule by mouth 2 (two) times daily.     [name] Kitchen selenium sulfide (SELSUN) 2.5 % shampoo Apply topically once a week. Apply to rash on chest and leave on overnight. Wash off in  the morning 118 mL 3  . Taurine 500 MG CAPS Take by mouth 2 (two) times daily.      . Thyroid 325 MG TABS Take 1 tablet by mouth daily.    [name] Kitchen triamcinolone lotion (KENALOG) 0.1 % Apply topically 2 (two) times daily as needed. For itchy rash  60 mL 1  . Tyrosine 500 MG CAPS Take by mouth 2 (two) times daily.      [name] Kitchen warfarin (COUMADIN) 2.5 MG tablet     . warfarin (COUMADIN) 5 MG tablet Use as directed by anticoagulation clinic 90 tablet 1   No current facility-administered medications for this visit.    Medical Decision Making:  Established Problem, Stable/Improving (1)  Treatment Plan Summary:Medication management We discussed at length about the medications treatment risks benefits and alternatives. I advised patient that he needs to evaluate his over-the-counter medications and he demonstrated understanding.  Continue Lexapro 10 mg daily. Patient reported that he has supply of the medications.  I will start him on Sonata 5 mg at bedtime to help with insomnia. He reported that he has already tried trazodone in the past and would not like to take the same medication. He was asking for a prescription of Klonopin. We discussed about the medications. Also advised him about the risk of falls and increased sedation of the medication. He demonstrated understanding. He will follow up in 1 month or earlier depending on his symptoms    More than 50% of the time spent in psychoeducation, counseling and coordination of care.    This note was generated in part or whole with voice recognition software. Voice regonition is usually quite accurate but there are transcription errors that can and very often do occur. I apologize for any typographical errors that were not detected and corrected.     Rainey Pines, MD  07/21/2015, 12:14 PM

## 2015-07-22 DIAGNOSIS — I482 Chronic atrial fibrillation: Secondary | ICD-10-CM | POA: Diagnosis not present

## 2015-07-28 DIAGNOSIS — M75112 Incomplete rotator cuff tear or rupture of left shoulder, not specified as traumatic: Secondary | ICD-10-CM | POA: Diagnosis not present

## 2015-07-28 DIAGNOSIS — M7581 Other shoulder lesions, right shoulder: Secondary | ICD-10-CM | POA: Diagnosis not present

## 2015-08-04 DIAGNOSIS — C4492 Squamous cell carcinoma of skin, unspecified: Secondary | ICD-10-CM | POA: Diagnosis not present

## 2015-08-04 DIAGNOSIS — L905 Scar conditions and fibrosis of skin: Secondary | ICD-10-CM | POA: Diagnosis not present

## 2015-08-05 DIAGNOSIS — I482 Chronic atrial fibrillation: Secondary | ICD-10-CM | POA: Diagnosis not present

## 2015-08-05 DIAGNOSIS — I420 Dilated cardiomyopathy: Secondary | ICD-10-CM | POA: Diagnosis not present

## 2015-08-05 DIAGNOSIS — I5022 Chronic systolic (congestive) heart failure: Secondary | ICD-10-CM | POA: Diagnosis not present

## 2015-08-05 DIAGNOSIS — I071 Rheumatic tricuspid insufficiency: Secondary | ICD-10-CM | POA: Diagnosis not present

## 2015-08-05 DIAGNOSIS — I340 Nonrheumatic mitral (valve) insufficiency: Secondary | ICD-10-CM | POA: Diagnosis not present

## 2015-08-05 DIAGNOSIS — R0602 Shortness of breath: Secondary | ICD-10-CM | POA: Diagnosis not present

## 2015-08-05 DIAGNOSIS — I38 Endocarditis, valve unspecified: Secondary | ICD-10-CM | POA: Diagnosis not present

## 2015-08-07 DIAGNOSIS — M1712 Unilateral primary osteoarthritis, left knee: Secondary | ICD-10-CM | POA: Diagnosis not present

## 2015-08-08 ENCOUNTER — Ambulatory Visit: Payer: Self-pay

## 2015-08-08 ENCOUNTER — Encounter: Payer: Self-pay | Admitting: Family Medicine

## 2015-08-08 ENCOUNTER — Ambulatory Visit (INDEPENDENT_AMBULATORY_CARE_PROVIDER_SITE_OTHER): Payer: PPO | Admitting: Family Medicine

## 2015-08-08 VITALS — BP 108/62 | HR 100 | Temp 97.9°F | Ht 66.5 in | Wt 157.8 lb

## 2015-08-08 DIAGNOSIS — Z125 Encounter for screening for malignant neoplasm of prostate: Secondary | ICD-10-CM

## 2015-08-08 DIAGNOSIS — M4802 Spinal stenosis, cervical region: Secondary | ICD-10-CM

## 2015-08-08 DIAGNOSIS — M542 Cervicalgia: Secondary | ICD-10-CM | POA: Diagnosis not present

## 2015-08-08 DIAGNOSIS — Z0000 Encounter for general adult medical examination without abnormal findings: Secondary | ICD-10-CM

## 2015-08-08 NOTE — Patient Instructions (Addendum)
Stop at check out for referral for PT  Use heat or ice on neck if needed  Call Dr Ammie Ferrier office regarding physical therapy for hips- I am unsure what your diagnosis is for hip pain  See Katha Cabal for your medicare wellness visit and labs (do not eat after breakfast that day)  Make an appointment with me for your physical   I will order your labs for May 2nd

## 2015-08-08 NOTE — Progress Notes (Signed)
Pre visit review using our clinic review tool, if applicable. No additional management support is needed unless otherwise documented below in the visit note. 

## 2015-08-08 NOTE — Progress Notes (Signed)
Subjective:    Patient ID: David Duncan, male    DOB: 07/01/1930, 82 y.o.   MRN: [id number]  HPI Here for f/u of chronic neck pain   Also still having shoulder pain - both shoulders were injected recently -[facility]  Now knee pain - just had an injection in L knee (lubricant type)-Dr Sabra Heck    Hx of spinal stenosis in neck that has been helped with PT in the past- wants a referral for it   Last MRI   CLINICAL DATA: Neck pain. Bilateral arm pain and numbness in the hands for sever years.  EXAM: MRI CERVICAL SPINE WITHOUT CONTRAST  TECHNIQUE: Multiplanar, multisequence MR imaging of the cervical spine was performed. No intravenous contrast was administered.  COMPARISON: Multiple exams, including 05/29/2014  FINDINGS: Chronic sphenoid sinusitis. The craniocervical junction appears unremarkable.  Intervertebral disc desiccation is observed at all levels in the cervical spine with loss of intervertebral disc height most notable at C4-5 and C5-6. Degenerative endplate disease is present at C4-5 and C5-6.  3 mm anterolisthesis at C3-4 with 2 mm retrolisthesis at C4-5. 3.5 mm anterolisthesis at C7-T1. The  No significant abnormal spinal cord signal is observed. Additional findings at individual levels are as follows:  C2-3: Borderline left foraminal stenosis due to facet spurring.  C3-4: Mild to moderate bilateral foraminal stenosis and borderline central narrowing of the thecal sac due to disc bulge, central disc protrusion, and especially facet spurring.  C4-5: Moderate bilateral foraminal stenosis and moderate central narrowing of the thecal sac due to intervertebral spurring, right paracentral disc protrusion, and facet and uncinate spurring.  C5-6: Moderate bilateral foraminal stenosis and moderate central narrowing of the thecal sac due to disc osteophyte complex, uncinate spurring, and facet spurring.  C6-7: Borderline central  narrowing of the thecal sac due to diffuse disc bulge. Bilateral facet arthropathy.  C7-T1: No impingement. Disc uncovering and bilateral facet arthropathy.  IMPRESSION: 1. Cervical spondylosis and degenerative disc disease, resulting in moderate impingement at C4-5 and C5-6 ; and mild to moderate impingement at C3-4. 2. Chronic sphenoid sinusitis.   Electronically Signed  By: Van Clines M.D.  On: 11/04/2014 14:09   Neck pain does not radiate to fingers or hands  No numbness No change in grip   Neck movement  Flex and ext normal Gets stiff after inactivity and in ams  Has to crack neck and turn it Sounds crackly    Incidentally - had a skin cancer removed from L scalp (small)  Told it was a melanoma and got it all  Will have stitches out soon   Patient Active Problem List   Diagnosis Date Noted  . Routine general medical examination at a health care facility 08/08/2015  . Congestive cardiomyopathy ([place]) 03/18/2015  . Cerebrovascular accident (CVA) ([place]) 03/18/2015  . Spinal stenosis in cervical region 02/05/2015  . Cervical spinal stenosis 02/02/2015  . Left shoulder pain 12/06/2014  . Cardiomyopathy, dilated ([place]) 09/16/2014  . Dyslipidemia 09/16/2014  . Cerebral vascular accident ([place]) 09/16/2014  . Heart valve disease 09/16/2014  . Chest pain 08/08/2014  . MI (mitral incompetence) 08/08/2014  . TI (tricuspid incompetence) 08/08/2014  . AF (paroxysmal atrial fibrillation) ([place]) 02/15/2014  . Atrial fibrillation, chronic ([place]) 01/16/2014  . Chronic systolic heart failure ([place]) 01/16/2014  . Chronic atrial fibrillation ([place]) 01/16/2014  . Breathlessness on exertion 12/19/2013  . Bunion, right foot 08/17/2013  . Macular degeneration 10/16/2012  . Encounter for Medicare annual wellness exam 08/16/2012  .  Neck pain 02/19/2011  . Hearing decreased 12/16/2010  . Medication adverse effect 07/28/2010  . Prostate cancer screening 07/28/2010  . Long  term (current) use of anticoagulants 07/22/2010  . HYPERLIPIDEMIA-MIXED 06/01/2010  . ATRIAL FIBRILLATION 11/28/2009  . ATRIAL FLUTTER 11/28/2009  . CVA 11/28/2009  . BIPOLAR AFFECTIVE DISORDER 02/20/2009   Past Medical History  Diagnosis Date  . GERD (gastroesophageal reflux disease)   . Osteoarthritis     in neck and left knee  . Bipolar disorder ([place])     hospitalized in past  . History of kidney stones     30 years ago  . Basal cell cancer 2008    on shoulder  . Macular degeneration   . Atrial fibrillation ([place])   . CVA (cerebral vascular accident) ([place]) 8/11  . Anxiety   . Generalized anxiety disorder    Past Surgical History  Procedure Laterality Date  . Back surgery  1991   Social History  Substance Use Topics  . Smoking status: Never Smoker   . Smokeless tobacco: Never Used  . Alcohol Use: No   Family History  Problem Relation Age of Onset  . Stroke Father   . Cancer Brother     prostate  . Coronary artery disease Brother   . Hypertension Brother    No Known Allergies Current Outpatient Prescriptions on File Prior to Visit  Medication Sig Dispense Refill  . 5-Hydroxytryptophan (5-HTP) 100 MG CAPS Take 200 mg by mouth at bedtime as needed.    . Ascorbic Acid (VITAMIN C) 500 MG tablet Take 500 mg by mouth 4 (four) times daily.      . B Complex Vitamins (B COMPLEX PO) Take by mouth 2 (two) times daily.     . Cholecalciferol (VITAMIN D-3) 5000 UNITS TABS Take 1 tablet by mouth 2 (two) times daily.    . Cyanocobalamin (VITAMIN B-12) 2500 MCG SUBL Take 2 tabs by mouth daily     . escitalopram (LEXAPRO) 10 MG tablet Take 1 tablet (10 mg total) by mouth every morning. 90 tablet 0  . Glucosamine-Chondroitin (GLUCOSAMINE CHONDR COMPLEX PO) Take by mouth 2 (two) times daily.      [name] Kitchen L-Lysine 500 MG TABS Take by mouth.    . Lutein 6 MG CAPS Take by mouth.    [name] Kitchen NATURE-THROID 48.75 MG TABS   0  . potassium chloride (K-DUR,KLOR-CON) 10 MEQ tablet     . potassium  chloride (MICRO-K) 10 MEQ CR capsule     . pyridOXINE (VITAMIN B-6) 25 MG tablet Take by mouth.    . Saw Palmetto 450 MG CAPS Take 1 capsule by mouth 2 (two) times daily.     [name] Kitchen selenium sulfide (SELSUN) 2.5 % shampoo Apply topically once a week. Apply to rash on chest and leave on overnight. Wash off in the morning 118 mL 3  . Taurine 500 MG CAPS Take by mouth 2 (two) times daily.      . Thyroid 325 MG TABS Take 1 tablet by mouth daily.    [name] Kitchen triamcinolone lotion (KENALOG) 0.1 % Apply topically 2 (two) times daily as needed. For itchy rash 60 mL 1  . Tyrosine 500 MG CAPS Take by mouth 2 (two) times daily.      [name] Kitchen warfarin (COUMADIN) 2.5 MG tablet     . warfarin (COUMADIN) 5 MG tablet Use as directed by anticoagulation clinic 90 tablet 1  . zaleplon (SONATA) 5 MG capsule Take 1 capsule (5 mg total) by mouth  at bedtime as needed for sleep. 30 capsule 0   No current facility-administered medications on file prior to visit.     Review of Systems Review of Systems  Constitutional: Negative for fever, appetite change, fatigue and unexpected weight change.  Eyes: Negative for pain and visual disturbance.  Respiratory: Negative for cough and shortness of breath.   Cardiovascular: Negative for cp or palpitations    Gastrointestinal: Negative for nausea, diarrhea and constipation.  Genitourinary: Negative for urgency and frequency.  Skin: Negative for pallor or rash   MSK pos for knee and shoulder pain , occ hip pain and chronic neck pain with spinal stenosis Neurological: Negative for weakness, light-headedness, numbness and headaches.  Hematological: Negative for adenopathy. Does not bruise/bleed easily.  Psychiatric/Behavioral: Negative for dysphoric mood. Pos for bipolar dz with some anxiety       Objective:   Physical Exam  Constitutional: He appears well-developed and well-nourished. No distress.  Well appearing  HENT:  Head: Normocephalic and atraumatic.  Eyes: Conjunctivae and EOM  are normal. Pupils are equal, round, and reactive to light. No scleral icterus.  Neck: Normal range of motion. Neck supple. No JVD present. No thyromegaly present.  Cardiovascular: Normal rate and regular rhythm.   Pulmonary/Chest: Effort normal and breath sounds normal.  Musculoskeletal:       Cervical back: He exhibits tenderness, bony tenderness and spasm. He exhibits normal range of motion, no edema and no deformity.  Tight cervical muscles bilaterally and also trapezius Some crepitus Tender mildly on lower cervical vertebrae  No neuro changes   Nl rom hips Some tenderness over greater trochanter bilat    Lymphadenopathy:    He has no cervical adenopathy.  Neurological: He has normal strength and normal reflexes. He displays no atrophy. No cranial nerve deficit or sensory deficit. He exhibits normal muscle tone. Coordination normal.  Skin: Skin is warm and dry. No rash noted. No erythema. No pallor.  Bandage on L scalp from recent melanoma removal  Psychiatric:  Timid affect today-mildly anxious           Assessment & Plan:   Problem List Items Addressed This Visit      Other   Neck pain   Relevant Orders   Ambulatory referral to Physical Therapy   Prostate cancer screening   Relevant Orders   PSA   Routine general medical examination at a health care facility   Relevant Orders   CBC with Differential/Platelet   Comprehensive metabolic panel   Lipid panel   TSH   Spinal stenosis in cervical region - Primary    Ongoing with acute on chronic pain and no neuro changes He sees orthopedics Has had PT with relief in the past-ref again for this Disc what to watch for and need for supportive pillow      Relevant Orders   Ambulatory referral to Physical Therapy

## 2015-08-10 NOTE — Assessment & Plan Note (Signed)
Ongoing with acute on chronic pain and no neuro changes He sees orthopedics Has had PT with relief in the past-ref again for this Disc what to watch for and need for supportive pillow

## 2015-08-12 ENCOUNTER — Ambulatory Visit: Payer: Self-pay

## 2015-08-13 DIAGNOSIS — M542 Cervicalgia: Secondary | ICD-10-CM | POA: Diagnosis not present

## 2015-08-19 DIAGNOSIS — M542 Cervicalgia: Secondary | ICD-10-CM | POA: Diagnosis not present

## 2015-08-20 ENCOUNTER — Encounter: Payer: Self-pay | Admitting: Psychiatry

## 2015-08-20 ENCOUNTER — Ambulatory Visit (INDEPENDENT_AMBULATORY_CARE_PROVIDER_SITE_OTHER): Payer: PPO | Admitting: Psychiatry

## 2015-08-20 VITALS — BP 124/78 | HR 98 | Temp 97.0°F | Ht 66.5 in | Wt 159.4 lb

## 2015-08-20 DIAGNOSIS — F3176 Bipolar disorder, in full remission, most recent episode depressed: Secondary | ICD-10-CM

## 2015-08-20 MED ORDER — BUPROPION HCL 100 MG PO TABS: 100.0000 mg | ORAL_TABLET | Freq: Every morning | ORAL | Status: DC

## 2015-08-20 MED ORDER — ESCITALOPRAM OXALATE 10 MG PO TABS: 10.0000 mg | ORAL_TABLET | ORAL | Status: DC

## 2015-08-20 NOTE — Progress Notes (Signed)
BH MD/PA/NP OP Progress Note  08/20/2015 12:06 PM David Duncan  MRN:  [id number]  Subjective:  Patient is a 82 year old male who presented for the follow-up. He has history of bipolar disorder. He reported that [employer] was too strong for him and he only took it for 3 days and stop the medication. He restarted taking the Wellbutrin as he has the previous prescription from Dr. Jimmye Norman. He tried it for 3 days and then titrated the dose to 200 mg. He stated that he started feeling dizzy on the medication so he then decrease the dose back to 100 mg. Patient is also taking several other vitamins and herbs. He was for skulls on getting the prescription of Wellbutrin. I discussed with the patient that he has never called our office and asked for permission to start the medication without discussing with the staff. He is currently taking Lexapro as well for his depressive symptoms. He reported that he continues to have depression and feels like of energy. He also has poor memory. We discussed about medication for his memory. He is not interested in taking medications for the same. He currently lives with his wife. He currently denied having any suicidal ideations or plans.    Chief Complaint: Want my medication "stronger" Chief Complaint    Follow-up; Medication Refill     Visit Diagnosis:     ICD-9-CM ICD-10-CM   1. Bipolar disorder, in full remission, most recent episode depressed ([place]) 296.56 F31.76     Past Medical History:  Past Medical History  Diagnosis Date  . GERD (gastroesophageal reflux disease)   . Osteoarthritis     in neck and left knee  . Bipolar disorder ([place])     hospitalized in past  . History of kidney stones     30 years ago  . Basal cell cancer 2008    on shoulder  . Macular degeneration   . Atrial fibrillation ([place])   . CVA (cerebral vascular accident) ([place]) 8/11  . Anxiety   . Generalized anxiety disorder     Past Surgical History  Procedure Laterality Date   . Back surgery  1991   Family History:  Family History  Problem Relation Age of Onset  . Stroke Father   . Cancer Brother     prostate  . Coronary artery disease Brother   . Hypertension Brother    Social History:  Social History   Social History  . Marital Status: Married    Spouse Name: N/A  . Number of Children: N/A  . Years of Education: N/A   Occupational History  . retired     used to work on farm, Press photographer, Cabin crew   Social History Main Topics  . Smoking status: Never Smoker   . Smokeless tobacco: Never Used  . Alcohol Use: No  . Drug Use: No  . Sexual Activity: Yes    Birth Control/ Protection: None   Other Topics Concern  . None   Social History Narrative   Moved from [place] 3/10   Walks 30-40 minutes daily   Additional History:   Assessment:    Musculoskeletal: Strength & Muscle Tone: within normal limits Gait & Station: normal Patient leans: N/A  Psychiatric Specialty Exam: Anxiety Patient reports no insomnia, nervous/anxious behavior or suicidal ideas.    Depression        Associated symptoms include does not have insomnia and no suicidal ideas.  Past medical history includes anxiety.     Review of Systems  Psychiatric/Behavioral: Negative for depression, suicidal ideas, hallucinations, memory loss and substance abuse. The patient is not nervous/anxious and does not have insomnia.   All other systems reviewed and are negative.   Blood pressure 124/78, pulse 98, temperature 97 F (36.1 C), temperature source Tympanic, height 5' 6.5" (1.689 m), weight 159 lb 6.4 oz (72.303 kg), SpO2 96 %.Body mass index is 25.35 kg/(m^2).  General Appearance: Well Groomed  Eye Contact:  Good  Speech:  Normal Rate  Volume:  Normal  Mood:  Good  Affect:  Appropriate and Full Range  Thought Process:  Linear and Logical  Orientation:  Full (Time, Place, and Person)  Thought Content:  Negative  Suicidal Thoughts:  No  Homicidal Thoughts:  No  Memory:   Immediate;   Good Recent;   Good Remote;   Good  Judgement:  Good  Insight:  Good  Psychomotor Activity:  Normal  Concentration:  Good  Recall:  Good  Fund of Knowledge: Good  Language: Good  Akathisia:  Negative  Handed:  Right unknown  AIMS (if indicated):  No done  Assets:  Communication Skills Desire for Improvement  ADL's:  Intact  Cognition: WNL  Sleep:  good   Is the patient at risk to self?  No. Has the patient been a risk to self in the past 6 months?  No. Has the patient been a risk to self within the distant past?  No. Is the patient a risk to others?  No. Has the patient been a risk to others in the past 6 months?  No. Has the patient been a risk to others within the distant past?  No.  Current Medications: Current Outpatient Prescriptions  Medication Sig Dispense Refill  . 5-Hydroxytryptophan (5-HTP) 100 MG CAPS Take 200 mg by mouth at bedtime as needed.    . Ascorbic Acid (VITAMIN C) 500 MG tablet Take 500 mg by mouth 4 (four) times daily.      . B Complex Vitamins (B COMPLEX PO) Take by mouth 2 (two) times daily.     . Cholecalciferol (VITAMIN D-3) 5000 UNITS TABS Take 1 tablet by mouth 2 (two) times daily.    . Cyanocobalamin (VITAMIN B-12) 2500 MCG SUBL Take 2 tabs by mouth daily     . escitalopram (LEXAPRO) 10 MG tablet Take 1 tablet (10 mg total) by mouth every morning. 90 tablet 0  . Glucosamine-Chondroitin (GLUCOSAMINE CHONDR COMPLEX PO) Take by mouth 2 (two) times daily.      [name] Kitchen L-Lysine 500 MG TABS Take by mouth.    . Lutein 6 MG CAPS Take by mouth.    . metoprolol tartrate (LOPRESSOR) 25 MG tablet     . NATURE-THROID 48.75 MG TABS   0  . potassium chloride (MICRO-K) 10 MEQ CR capsule     . pyridOXINE (VITAMIN B-6) 25 MG tablet Take by mouth.    . Saw Palmetto 450 MG CAPS Take 1 capsule by mouth 2 (two) times daily.     [name] Kitchen selenium sulfide (SELSUN) 2.5 % shampoo Apply topically once a week. Apply to rash on chest and leave on overnight. Wash off in the  morning 118 mL 3  . Taurine 500 MG CAPS Take by mouth 2 (two) times daily.      . Thyroid 325 MG TABS Take 1 tablet by mouth daily.    [name] Kitchen triamcinolone lotion (KENALOG) 0.1 % Apply topically 2 (two) times daily as needed. For itchy rash 60 mL 1  . Tyrosine 500  MG CAPS Take by mouth 2 (two) times daily.      [name] Kitchen warfarin (COUMADIN) 2.5 MG tablet     . warfarin (COUMADIN) 5 MG tablet Use as directed by anticoagulation clinic 90 tablet 1  . zaleplon (SONATA) 5 MG capsule Take 1 capsule (5 mg total) by mouth at bedtime as needed for sleep. 30 capsule 0   No current facility-administered medications for this visit.    Medical Decision Making:  Established Problem, Stable/Improving (1)  Treatment Plan Summary:Medication management We discussed at length about the medications treatment risks benefits and alternatives. I advised patient that he needs to evaluate his over-the-counter medications and he demonstrated understanding.  Continue Lexapro 10 mg daily  I will start him on Wellbutrin 100 mg daily and advised patient that he cannot titrate the medication without discussing with our staff and he demonstrated understanding.  He will try Sonata are 2.5 mg at bedtime to help with the insomnia.  Follow-up in one month or earlier depending on his symptoms  Also advised him about the risk of falls and increased sedation of the medication. He demonstrated understanding. He will follow up in 1 month or earlier depending on his symptoms    More than 50% of the time spent in psychoeducation, counseling and coordination of care.    This note was generated in part or whole with voice recognition software. Voice regonition is usually quite accurate but there are transcription errors that can and very often do occur. I apologize for any typographical errors that were not detected and corrected.     Rainey Pines, MD  08/20/2015, 12:06 PM

## 2015-08-21 ENCOUNTER — Telehealth: Payer: Self-pay

## 2015-08-21 NOTE — Telephone Encounter (Signed)
pharmacy has called twice and stated that they need to speak with provider. pt has wrong rx and they want to speak to dr. Gretel Acre. they need correct rx sent

## 2015-08-22 DIAGNOSIS — M542 Cervicalgia: Secondary | ICD-10-CM | POA: Diagnosis not present

## 2015-08-25 NOTE — Telephone Encounter (Signed)
Spoke with pharmacist. Will continue with meds as dispensed.

## 2015-08-26 DIAGNOSIS — M542 Cervicalgia: Secondary | ICD-10-CM | POA: Diagnosis not present

## 2015-08-29 DIAGNOSIS — M542 Cervicalgia: Secondary | ICD-10-CM | POA: Diagnosis not present

## 2015-09-02 DIAGNOSIS — I482 Chronic atrial fibrillation: Secondary | ICD-10-CM | POA: Diagnosis not present

## 2015-09-17 ENCOUNTER — Ambulatory Visit (INDEPENDENT_AMBULATORY_CARE_PROVIDER_SITE_OTHER): Payer: PPO | Admitting: Psychiatry

## 2015-09-17 ENCOUNTER — Encounter: Payer: Self-pay | Admitting: Psychiatry

## 2015-09-17 VITALS — BP 106/69 | HR 91 | Temp 96.5°F | Resp 16 | Ht 66.6 in | Wt 155.8 lb

## 2015-09-17 DIAGNOSIS — F411 Generalized anxiety disorder: Secondary | ICD-10-CM

## 2015-09-17 DIAGNOSIS — F3176 Bipolar disorder, in full remission, most recent episode depressed: Secondary | ICD-10-CM

## 2015-09-17 DIAGNOSIS — M542 Cervicalgia: Secondary | ICD-10-CM | POA: Diagnosis not present

## 2015-09-17 MED ORDER — BUPROPION HCL 75 MG PO TABS: 75.0000 mg | ORAL_TABLET | Freq: Every morning | ORAL | Status: DC

## 2015-09-17 MED ORDER — ESCITALOPRAM OXALATE 10 MG PO TABS: 15.0000 mg | ORAL_TABLET | ORAL | Status: DC

## 2015-09-17 NOTE — Progress Notes (Signed)
BH MD/PA/NP OP Progress Note  09/17/2015 12:07 PM David Duncan  MRN:  [id number]  Subjective:  Patient is a 82 year old male who presented for the follow-up. He has history of bipolar disorder. He reported that he has been feeling depressed and he is not sleeping well at night. He reported that he usually wakes up until 6:00 in the morning and then he will take his thyroid medication and sleep at that time. He is usually sleeping for around 5 hours in the morning. Patient reported that he has been taking melatonin to help him sleep. He has not tried the [employer]. Patient appeared calm during the interview. He reported that the Wellbutrin is also affecting his sleep. We discussed about the medications. He stated that he is compliant with his medications. Patient appeared very respectful during the interview as social. He currently denied having any perceptual disturbances. He denied having any anger anxiety or paranoia. He memory appears fine. He has a slow gait. He reported that he is getting his labs done on a regular basis.He currently lives with his wife. He currently denied having any suicidal ideations or plans.    Chief Complaint:   Visit Diagnosis:     ICD-9-CM ICD-10-CM   1. Bipolar disorder, in full remission, most recent episode depressed ([place]) 296.56 F31.76   2. GAD (generalized anxiety disorder) 300.02 F41.1     Past Medical History:  Past Medical History  Diagnosis Date  . GERD (gastroesophageal reflux disease)   . Osteoarthritis     in neck and left knee  . Bipolar disorder ([place])     hospitalized in past  . History of kidney stones     30 years ago  . Basal cell cancer 2008    on shoulder  . Macular degeneration   . Atrial fibrillation ([place])   . CVA (cerebral vascular accident) ([place]) 8/11  . Anxiety   . Generalized anxiety disorder     Past Surgical History  Procedure Laterality Date  . Back surgery  1991   Family History:  Family History  Problem Relation Age  of Onset  . Stroke Father   . Cancer Brother     prostate  . Coronary artery disease Brother   . Hypertension Brother    Social History:  Social History   Social History  . Marital Status: Married    Spouse Name: N/A  . Number of Children: N/A  . Years of Education: N/A   Occupational History  . retired     used to work on farm, Press photographer, Cabin crew   Social History Main Topics  . Smoking status: Never Smoker   . Smokeless tobacco: Never Used  . Alcohol Use: No  . Drug Use: No  . Sexual Activity: Yes    Birth Control/ Protection: None   Other Topics Concern  . None   Social History Narrative   Moved from [place] 3/10   Walks 30-40 minutes daily   Additional History:   Assessment:    Musculoskeletal: Strength & Muscle Tone: within normal limits Gait & Station: normal Patient leans: N/A  Psychiatric Specialty Exam: Anxiety Patient reports no insomnia, nervous/anxious behavior or suicidal ideas.    Depression        Associated symptoms include does not have insomnia and no suicidal ideas.  Past medical history includes anxiety.     Review of Systems  Psychiatric/Behavioral: Negative for depression, suicidal ideas, hallucinations, memory loss and substance abuse. The patient is not nervous/anxious and  does not have insomnia.   All other systems reviewed and are negative.   Blood pressure 106/69, pulse 91, temperature 96.5 F (35.8 C), temperature source Tympanic, resp. rate 16, height 5' 6.6" (1.692 m), weight 155 lb 12.8 oz (70.67 kg), SpO2 92 %.Body mass index is 24.69 kg/(m^2).  General Appearance: Well Groomed and slow gait  Eye Contact:  Good  Speech:  Normal Rate and Slow  Volume:  Normal  Mood:  Good  Affect:  Appropriate and Full Range  Thought Process:  Linear and Logical  Orientation:  Full (Time, Place, and Person)  Thought Content:  Negative  Suicidal Thoughts:  No  Homicidal Thoughts:  No  Memory:  Immediate;   Good Recent;    Good Remote;   Good  Judgement:  Good  Insight:  Good  Psychomotor Activity:  Normal  Concentration:  Good  Recall:  Good  Fund of Knowledge: Good  Language: Good  Akathisia:  Negative  Handed:  Right unknown  AIMS (if indicated):  No done  Assets:  Communication Skills Desire for Improvement  ADL's:  Intact  Cognition: WNL  Sleep:  good   Is the patient at risk to self?  No. Has the patient been a risk to self in the past 6 months?  No. Has the patient been a risk to self within the distant past?  No. Is the patient a risk to others?  No. Has the patient been a risk to others in the past 6 months?  No. Has the patient been a risk to others within the distant past?  No.  Current Medications: Current Outpatient Prescriptions  Medication Sig Dispense Refill  . 5-Hydroxytryptophan (5-HTP) 100 MG CAPS Take 200 mg by mouth at bedtime as needed.    . Ascorbic Acid (VITAMIN C) 500 MG tablet Take 500 mg by mouth 4 (four) times daily.      . B Complex Vitamins (B COMPLEX PO) Take by mouth 2 (two) times daily.     [name] Kitchen buPROPion (WELLBUTRIN) 100 MG tablet Take 1 tablet (100 mg total) by mouth every morning. 30 tablet 1  . Cholecalciferol (VITAMIN D-3) 5000 UNITS TABS Take 1 tablet by mouth 2 (two) times daily.    . Cyanocobalamin (VITAMIN B-12) 2500 MCG SUBL Take 2 tabs by mouth daily     . escitalopram (LEXAPRO) 10 MG tablet Take 1 tablet (10 mg total) by mouth every morning. 90 tablet 0  . Glucosamine-Chondroitin (GLUCOSAMINE CHONDR COMPLEX PO) Take by mouth 2 (two) times daily.      [name] Kitchen L-Lysine 500 MG TABS Take by mouth.    . Lutein 6 MG CAPS Take by mouth.    . metoprolol tartrate (LOPRESSOR) 25 MG tablet     . NATURE-THROID 48.75 MG TABS   0  . potassium chloride (MICRO-K) 10 MEQ CR capsule     . pyridOXINE (VITAMIN B-6) 25 MG tablet Take by mouth.    . Saw Palmetto 450 MG CAPS Take 1 capsule by mouth 2 (two) times daily.     [name] Kitchen selenium sulfide (SELSUN) 2.5 % shampoo Apply topically  once a week. Apply to rash on chest and leave on overnight. Wash off in the morning 118 mL 3  . Taurine 500 MG CAPS Take by mouth 2 (two) times daily.      . Thyroid 325 MG TABS Take 1 tablet by mouth daily.    [name] Kitchen triamcinolone lotion (KENALOG) 0.1 % Apply topically 2 (two) times daily as needed.  For itchy rash 60 mL 1  . Tyrosine 500 MG CAPS Take by mouth 2 (two) times daily.      [name] Kitchen warfarin (COUMADIN) 2.5 MG tablet     . warfarin (COUMADIN) 5 MG tablet Use as directed by anticoagulation clinic 90 tablet 1   No current facility-administered medications for this visit.    Medical Decision Making:  Established Problem, Stable/Improving (1)  Treatment Plan Summary:Medication management We discussed at length about the medications treatment risks benefits and alternatives. I advised patient that he needs to evaluate his over-the-counter medications and he demonstrated understanding.  Increase  Lexapro 15 mg daily  Decrease Wellbutrin 75 mg in the morning and he agreed with the plan. Patient will continue taking melatonin on a when necessary basis.  Follow-up in 2  month or earlier depending on his symptoms  Also advised him about the risk of falls and increased sedation of the medication. He demonstrated understanding. He will follow up in 1 month or earlier depending on his symptoms    More than 50% of the time spent in psychoeducation, counseling and coordination of care.    This note was generated in part or whole with voice recognition software. Voice regonition is usually quite accurate but there are transcription errors that can and very often do occur. I apologize for any typographical errors that were not detected and corrected.     Rainey Pines, MD  09/17/2015, 12:07 PM

## 2015-09-19 DIAGNOSIS — M542 Cervicalgia: Secondary | ICD-10-CM | POA: Diagnosis not present

## 2015-09-23 DIAGNOSIS — M542 Cervicalgia: Secondary | ICD-10-CM | POA: Diagnosis not present

## 2015-09-25 DIAGNOSIS — M542 Cervicalgia: Secondary | ICD-10-CM | POA: Diagnosis not present

## 2015-09-30 DIAGNOSIS — M542 Cervicalgia: Secondary | ICD-10-CM | POA: Diagnosis not present

## 2015-10-02 DIAGNOSIS — I482 Chronic atrial fibrillation: Secondary | ICD-10-CM | POA: Diagnosis not present

## 2015-10-02 DIAGNOSIS — M542 Cervicalgia: Secondary | ICD-10-CM | POA: Diagnosis not present

## 2015-10-08 DIAGNOSIS — M542 Cervicalgia: Secondary | ICD-10-CM | POA: Diagnosis not present

## 2015-10-10 DIAGNOSIS — M542 Cervicalgia: Secondary | ICD-10-CM | POA: Diagnosis not present

## 2015-10-15 DIAGNOSIS — M542 Cervicalgia: Secondary | ICD-10-CM | POA: Diagnosis not present

## 2015-10-17 ENCOUNTER — Other Ambulatory Visit: Payer: Self-pay | Admitting: Psychiatry

## 2015-10-17 DIAGNOSIS — M542 Cervicalgia: Secondary | ICD-10-CM | POA: Diagnosis not present

## 2015-10-20 DIAGNOSIS — M542 Cervicalgia: Secondary | ICD-10-CM | POA: Diagnosis not present

## 2015-10-22 DIAGNOSIS — M542 Cervicalgia: Secondary | ICD-10-CM | POA: Diagnosis not present

## 2015-10-22 DIAGNOSIS — M1712 Unilateral primary osteoarthritis, left knee: Secondary | ICD-10-CM | POA: Diagnosis not present

## 2015-10-22 DIAGNOSIS — I482 Chronic atrial fibrillation: Secondary | ICD-10-CM | POA: Diagnosis not present

## 2015-10-24 DIAGNOSIS — M1712 Unilateral primary osteoarthritis, left knee: Secondary | ICD-10-CM | POA: Diagnosis not present

## 2015-10-27 DIAGNOSIS — M75112 Incomplete rotator cuff tear or rupture of left shoulder, not specified as traumatic: Secondary | ICD-10-CM | POA: Diagnosis not present

## 2015-10-27 DIAGNOSIS — G8929 Other chronic pain: Secondary | ICD-10-CM | POA: Diagnosis not present

## 2015-10-27 DIAGNOSIS — M7581 Other shoulder lesions, right shoulder: Secondary | ICD-10-CM | POA: Diagnosis not present

## 2015-10-27 DIAGNOSIS — M25512 Pain in left shoulder: Secondary | ICD-10-CM | POA: Diagnosis not present

## 2015-10-27 DIAGNOSIS — M25511 Pain in right shoulder: Secondary | ICD-10-CM | POA: Diagnosis not present

## 2015-10-29 DIAGNOSIS — M542 Cervicalgia: Secondary | ICD-10-CM | POA: Diagnosis not present

## 2015-10-31 DIAGNOSIS — M542 Cervicalgia: Secondary | ICD-10-CM | POA: Diagnosis not present

## 2015-11-03 DIAGNOSIS — M542 Cervicalgia: Secondary | ICD-10-CM | POA: Diagnosis not present

## 2015-11-05 DIAGNOSIS — L570 Actinic keratosis: Secondary | ICD-10-CM | POA: Diagnosis not present

## 2015-11-05 DIAGNOSIS — Z85828 Personal history of other malignant neoplasm of skin: Secondary | ICD-10-CM | POA: Diagnosis not present

## 2015-11-05 DIAGNOSIS — D485 Neoplasm of uncertain behavior of skin: Secondary | ICD-10-CM | POA: Diagnosis not present

## 2015-11-05 DIAGNOSIS — X32XXXA Exposure to sunlight, initial encounter: Secondary | ICD-10-CM | POA: Diagnosis not present

## 2015-11-05 DIAGNOSIS — C44311 Basal cell carcinoma of skin of nose: Secondary | ICD-10-CM | POA: Diagnosis not present

## 2015-11-06 DIAGNOSIS — I5022 Chronic systolic (congestive) heart failure: Secondary | ICD-10-CM | POA: Diagnosis not present

## 2015-11-06 DIAGNOSIS — M1712 Unilateral primary osteoarthritis, left knee: Secondary | ICD-10-CM | POA: Diagnosis not present

## 2015-11-06 DIAGNOSIS — I340 Nonrheumatic mitral (valve) insufficiency: Secondary | ICD-10-CM | POA: Diagnosis not present

## 2015-11-06 DIAGNOSIS — I482 Chronic atrial fibrillation: Secondary | ICD-10-CM | POA: Diagnosis not present

## 2015-11-11 ENCOUNTER — Ambulatory Visit (INDEPENDENT_AMBULATORY_CARE_PROVIDER_SITE_OTHER): Payer: PPO | Admitting: Psychiatry

## 2015-11-11 DIAGNOSIS — F330 Major depressive disorder, recurrent, mild: Secondary | ICD-10-CM | POA: Diagnosis not present

## 2015-11-11 DIAGNOSIS — F411 Generalized anxiety disorder: Secondary | ICD-10-CM

## 2015-11-11 MED ORDER — BUPROPION HCL 75 MG PO TABS: 75.0000 mg | ORAL_TABLET | Freq: Every morning | ORAL | 1 refills | Status: DC

## 2015-11-11 MED ORDER — ESCITALOPRAM OXALATE 20 MG PO TABS: 20.0000 mg | ORAL_TABLET | ORAL | 1 refills | Status: DC

## 2015-11-11 MED ORDER — ESCITALOPRAM OXALATE 10 MG PO TABS: 10.0000 mg | ORAL_TABLET | ORAL | 1 refills | Status: DC

## 2015-11-11 NOTE — Progress Notes (Signed)
BH MD/PA/NP OP Progress Note  11/11/2015 1:25 PM David Duncan  MRN:  [id number]  Subjective:  Patient is a 82 year old male who presented for the follow-up. He reported that he has been feeling fine and does not have any acute issues. Patient reported that he has been compliant with his medications. He is going to have knee replacement done on the 23rd. He reported that he has checked online and Lexapro 10 mg is only pills that he needs. He reported that he is feeling well on the current combination of the medications. He currently denied having any side effects of the medications. He reported that he has been taking them as prescribed. He denied having any adverse effects. He sleeps well with the help of HTP. He is also taking warfarin on a regular basis and has his labs done regularly. He appears pleasant and cooperative during the interview.   He spends time reading books from [employer] and his wife is also supportive and reported enjoying reading books. He stated that they only use television for watching videos. They do not have any cable and they do not watch any news. He has a good healthy lifestyle.    Chief Complaint:  Chief Complaint    Medication Refill     Visit Diagnosis:     ICD-9-CM ICD-10-CM   1. MDD (major depressive disorder), recurrent episode, mild ([facility]) 296.31 F33.0   2. GAD (generalized anxiety disorder) 300.02 F41.1     Past Medical History:  Past Medical History:  Diagnosis Date  . Anxiety   . Atrial fibrillation ([place])   . Basal cell cancer 2008   on shoulder  . Bipolar disorder ([place])    hospitalized in past  . CVA (cerebral vascular accident) ([place]) 8/11  . Generalized anxiety disorder   . GERD (gastroesophageal reflux disease)   . History of kidney stones    30 years ago  . Macular degeneration   . Osteoarthritis    in neck and left knee    Past Surgical History:  Procedure Laterality Date  . BACK SURGERY  1991   Family History:  Family  History  Problem Relation Age of Onset  . Stroke Father   . Cancer Brother     prostate  . Coronary artery disease Brother   . Hypertension Brother    Social History:  Social History   Social History  . Marital status: Married    Spouse name: N/A  . Number of children: N/A  . Years of education: N/A   Occupational History  . retired     used to work on farm, Press photographer, Cabin crew   Social History Main Topics  . Smoking status: Never Smoker  . Smokeless tobacco: Never Used  . Alcohol use No  . Drug use: No  . Sexual activity: Yes    Birth control/ protection: None   Other Topics Concern  . Not on file   Social History Narrative   Moved from [place] 3/10   Walks 30-40 minutes daily   Additional History:   Assessment:    Musculoskeletal: Strength & Muscle Tone: within normal limits Gait & Station: normal Patient leans: N/A  Psychiatric Specialty Exam: Anxiety  Patient reports no insomnia, nervous/anxious behavior or suicidal ideas.    Depression         Associated symptoms include does not have insomnia and no suicidal ideas.  Past medical history includes anxiety.   Medication Refill     Review of Systems  Psychiatric/Behavioral: Negative for depression, hallucinations, memory loss, substance abuse and suicidal ideas. The patient is not nervous/anxious and does not have insomnia.   All other systems reviewed and are negative.   There were no vitals taken for this visit.There is no height or weight on file to calculate BMI.  General Appearance: Well Groomed and slow gait  Eye Contact:  Good  Speech:  Normal Rate and Slow  Volume:  Normal  Mood:  Good  Affect:  Appropriate and Full Range  Thought Process:  Linear and Logical  Orientation:  Full (Time, Place, and Person)  Thought Content:  Negative  Suicidal Thoughts:  No  Homicidal Thoughts:  No  Memory:  Immediate;   Good Recent;   Good Remote;   Good  Judgement:  Good  Insight:  Good   Psychomotor Activity:  Normal  Concentration:  Good  Recall:  Good  Fund of Knowledge: Good  Language: Good  Akathisia:  Negative  Handed:  Right unknown  AIMS (if indicated):  No done  Assets:  Communication Skills Desire for Improvement  ADL's:  Intact  Cognition: WNL  Sleep:  good   Is the patient at risk to self?  No. Has the patient been a risk to self in the past 6 months?  No. Has the patient been a risk to self within the distant past?  No. Is the patient a risk to others?  No. Has the patient been a risk to others in the past 6 months?  No. Has the patient been a risk to others within the distant past?  No.  Current Medications: Current Outpatient Prescriptions  Medication Sig Dispense Refill  . 5-Hydroxytryptophan (5-HTP) 100 MG CAPS Take 200 mg by mouth at bedtime as needed.    . Ascorbic Acid (VITAMIN C) 500 MG tablet Take 500 mg by mouth 4 (four) times daily.      . B Complex Vitamins (B COMPLEX PO) Take by mouth 2 (two) times daily.     [name] Kitchen buPROPion (WELLBUTRIN) 75 MG tablet Take 1 tablet (75 mg total) by mouth every morning. 90 tablet 1  . Cholecalciferol (VITAMIN D-3) 5000 UNITS TABS Take 1 tablet by mouth 2 (two) times daily.    . Cyanocobalamin (VITAMIN B-12) 2500 MCG SUBL Take 2 tabs by mouth daily     . escitalopram (LEXAPRO) 10 MG tablet Take 1 tablet (10 mg total) by mouth every morning. Please dispense 10mg  qam #90 90 tablet 1  . Glucosamine-Chondroitin (GLUCOSAMINE CHONDR COMPLEX PO) Take by mouth 2 (two) times daily.      [name] Kitchen L-Lysine 500 MG TABS Take by mouth.    . Lutein 6 MG CAPS Take by mouth.    . metoprolol tartrate (LOPRESSOR) 25 MG tablet     . NATURE-THROID 48.75 MG TABS   0  . potassium chloride (MICRO-K) 10 MEQ CR capsule     . pyridOXINE (VITAMIN B-6) 25 MG tablet Take by mouth.    . selenium sulfide (SELSUN) 2.5 % shampoo Apply topically once a week. Apply to rash on chest and leave on overnight. Wash off in the morning 118 mL 3  . Taurine  500 MG CAPS Take by mouth 2 (two) times daily.      . Thyroid 325 MG TABS Take 1 tablet by mouth daily.    [name] Kitchen triamcinolone lotion (KENALOG) 0.1 % Apply topically 2 (two) times daily as needed. For itchy rash 60 mL 1  . Tyrosine 500 MG CAPS Take by  mouth 2 (two) times daily.      [name] Kitchen warfarin (COUMADIN) 2.5 MG tablet     . warfarin (COUMADIN) 5 MG tablet Use as directed by anticoagulation clinic 90 tablet 1   No current facility-administered medications for this visit.     Medical Decision Making:  Established Problem, Stable/Improving (1)  Treatment Plan Summary:Medication management We discussed at length about the medications treatment risks benefits and alternatives. I advised patient that he needs to evaluate his over-the-counter medications and he demonstrated understanding.  Continue Lexapro 10 mg daily. I will prescribe him 3 month supply of the medications.  Continue Wellbutrin 75 mg in the morning and 3  month supply of the medication was given. Patient will continue taking melatonin on a when necessary basis.  Follow-up in 3  month or earlier depending on his symptoms   More than 50% of the time spent in psychoeducation, counseling and coordination of care.    This note was generated in part or whole with voice recognition software. Voice regonition is usually quite accurate but there are transcription errors that can and very often do occur. I apologize for any typographical errors that were not detected and corrected.     Rainey Pines, MD  11/11/2015, 1:25 PM

## 2015-11-17 ENCOUNTER — Ambulatory Visit: Payer: PPO | Admitting: Psychiatry

## 2015-11-18 ENCOUNTER — Ambulatory Visit: Payer: PPO | Admitting: Psychiatry

## 2015-11-19 ENCOUNTER — Encounter
Admission: RE | Admit: 2015-11-19 | Discharge: 2015-11-19 | Disposition: A | Discharge status: Home / Self Care | Payer: PPO | Source: Ambulatory Visit | Attending: Orthopedic Surgery | Admitting: Orthopedic Surgery

## 2015-11-19 DIAGNOSIS — Z01810 Encounter for preprocedural cardiovascular examination: Secondary | ICD-10-CM | POA: Diagnosis not present

## 2015-11-19 DIAGNOSIS — F419 Anxiety disorder, unspecified: Secondary | ICD-10-CM | POA: Diagnosis not present

## 2015-11-19 DIAGNOSIS — Z01812 Encounter for preprocedural laboratory examination: Secondary | ICD-10-CM | POA: Diagnosis not present

## 2015-11-19 HISTORY — DX: Tremor, unspecified: R25.1

## 2015-11-19 LAB — SURGICAL PCR SCREEN
MRSA, PCR: NEGATIVE
STAPHYLOCOCCUS AUREUS: NEGATIVE

## 2015-11-19 LAB — COMPREHENSIVE METABOLIC PANEL
ALBUMIN: 3.6 g/dL (ref 3.5–5.0)
ALT: 25 U/L (ref 17–63)
ANION GAP: 7 (ref 5–15)
AST: 30 U/L (ref 15–41)
Alkaline Phosphatase: 62 U/L (ref 38–126)
BUN: 30 mg/dL — ABNORMAL HIGH (ref 6–20)
CO2: 25 mmol/L (ref 22–32)
Calcium: 9.4 mg/dL (ref 8.9–10.3)
Chloride: 106 mmol/L (ref 101–111)
Creatinine, Ser: 0.86 mg/dL (ref 0.61–1.24)
GFR calc Af Amer: >60 mL/min (ref >60)
GFR calc non Af Amer: >60 mL/min (ref >60)
GLUCOSE: 84 mg/dL (ref 65–99)
POTASSIUM: 4.6 mmol/L (ref 3.5–5.1)
SODIUM: 138 mmol/L (ref 135–145)
TOTAL PROTEIN: 6.9 g/dL (ref 6.5–8.1)
Total Bilirubin: 0.9 mg/dL (ref 0.3–1.2)

## 2015-11-19 LAB — TYPE AND SCREEN
ABO/RH(D): A POS
ANTIBODY SCREEN: NEGATIVE

## 2015-11-19 LAB — CBC
HCT: 43.7 % (ref 40.0–52.0)
HEMOGLOBIN: 15.1 g/dL (ref 13.0–18.0)
MCH: 33.9 pg (ref 26.0–34.0)
MCHC: 34.6 g/dL (ref 32.0–36.0)
MCV: 98 fL (ref 80.0–100.0)
Platelets: 111 10*3/uL — ABNORMAL LOW (ref 150–440)
RBC: 4.45 MIL/uL (ref 4.40–5.90)
RDW: 15.2 % — ABNORMAL HIGH (ref 11.5–14.5)
WBC: 6.5 10*3/uL (ref 3.8–10.6)

## 2015-11-19 LAB — SEDIMENTATION RATE: Sed Rate: 13 mm/hr (ref 0–20)

## 2015-11-19 LAB — PROTIME-INR
INR: 1.37
Prothrombin Time: 17 seconds — ABNORMAL HIGH (ref 11.4–15.2)

## 2015-11-19 LAB — URINALYSIS COMPLETE WITH MICROSCOPIC (ARMC ONLY)
Bacteria, UA: NONE SEEN
Bilirubin Urine: NEGATIVE
GLUCOSE, UA: NEGATIVE mg/dL
Hgb urine dipstick: NEGATIVE
Ketones, ur: NEGATIVE mg/dL
Leukocytes, UA: NEGATIVE
Nitrite: NEGATIVE
Protein, ur: NEGATIVE mg/dL
SPECIFIC GRAVITY, URINE: 1.012 (ref 1.005–1.030)
SQUAMOUS EPITHELIAL / LPF: NONE SEEN
pH: 6 (ref 5.0–8.0)

## 2015-11-19 LAB — APTT

## 2015-11-19 NOTE — Patient Instructions (Signed)
  Your procedure is scheduled on: 12/03/15 Wed Report to Same Day Surgery 2nd floor medical mall To find out your arrival time please call [phone] between 1PM - 3PM on 12/02/15 Tues  Remember: Instructions that are not followed completely may result in serious medical risk, up to and including death, or upon the discretion of your surgeon and anesthesiologist your surgery may need to be rescheduled.    _x___ 1. Do not eat food or drink liquids after midnight. No gum chewing or hard candies.     __x__ 2. No Alcohol for 24 hours before or after surgery.   __x__3. No Smoking for 24 prior to surgery.   ____  4. Bring all medications with you on the day of surgery if instructed.    __x__ 5. Notify your doctor if there is any change in your medical condition     (cold, fever, infections).     Do not wear jewelry, make-up, hairpins, clips or nail polish.  Do not wear lotions, powders, or perfumes. You may wear deodorant.  Do not shave 48 hours prior to surgery. Men may shave face and neck.  Do not bring valuables to the hospital.    [facility] is not responsible for any belongings or valuables.               Contacts, dentures or bridgework may not be worn into surgery.  Leave your suitcase in the car. After surgery it may be brought to your room.  For patients admitted to the hospital, discharge time is determined by your treatment team.   Patients discharged the day of surgery will not be allowed to drive home.    Please read over the following fact sheets that you were given:   [facility] Preparing for Surgery and or MRSA Information   _x___ Take these medicines the morning of surgery with A SIP OF WATER:    1. metoprolol tartrate (LOPRESSOR) 25 MG tablet  2.  3.  4.  5.  6.  ____ Fleet Enema (as directed)   _x___ Use CHG Soap or sage wipes as directed on instruction sheet   ____ Use inhalers on the day of surgery and bring to hospital day of surgery  ____ Stop  metformin 2 days prior to surgery    ____ Take 1/2 of usual insulin dose the night before surgery and none on the morning of           surgery.   __x__ Stop aspirin or coumadin, or plavix Stop Coumadin 5 days before surgery  _x__ Stop Anti-inflammatories such as Advil, Aleve, Ibuprofen, Motrin, Naproxen,          Naprosyn, Goodies powders or aspirin products. Ok to take Tylenol.   ____ Stop supplements until after surgery.    ____ Bring C-Pap to the hospital.

## 2015-11-19 NOTE — Pre-Procedure Instructions (Signed)
Component Name Value Ref Range  LV Ejection Fraction (%) 30   Aortic Valve Stenosis Grade none   Aortic Valve Regurgitation Grade none   Aortic Valve Max Velocity (m/s) 0.9 m/sec   Aortic Valve Stenosis Mean Gradient (mmHg) 2.0 mmHg   Mitral Valve Regurgitation Grade severe   Mitral Valve Stenosis Grade none   Tricuspid Valve Regurgitation Grade severe   Tricuspid Valve Regurgitation Max Velocity (m/s) 3 m/sec   Right Ventricle Systolic Pressure (mmHg) [id number] mmHg   LV End Diastolic Diameter (cm) 5.2 cm  LV End Systolic Diameter (cm) 4.3 cm  LV Septum Wall Thickness (cm) 0.8 cm  LV Posterior Wall Thickness (cm) 0.67 cm  Left Atrium Diameter (cm) 4.4 cm  Result Narrative  CARDIOLOGY David Duncan, David Duncan CLIN[facility] [id number] [facility] Acct #: [id number] [address] [name], [name] [address] Date: 07/07/2015 02:18 PM  Adult Male Age: 82 yrs ECHOCARDIOGRAM REPORTOutpatient  David Duncan  STUDY:CHEST WALL TAPE:0000:00: 0:00:00MD1:KOWALSKI, BRUCE JAY ECHO:Yes DOPPLER:YesFILE:0000-000-000BP: 110/82 mmHg  COLOR:YesCONTRAST:No MACHINE:PhilipsHeight: 66 in  RV BIOPSY:No 3D:NoSOUND QLTY:Moderate Weight: 156 lb MEDIUM:NoneBSA: 1.8 m2  ___________________________________________________________________________________________  HISTORY:DOE, Recent A.Fib REASON:Assess, LV function INDICATION:R06.02 Shortness of  breath  ___________________________________________________________________________________________ ECHOCARDIOGRAPHIC MEASUREMENTS 2D DIMENSIONS AORTA ValuesNormal RangeMAIN PAValuesNormal Range Annulus:nm* [2.3 - 2.9]PA Main:nm* [1.5 - 2.1] Aorta Sin:nm* [3.1 - 3.7] RIGHT VENTRICLE ST Junction:nm* [2.6 - 3.2]RV Base:3.5 cm[ < 4.2] Asc.Aorta:nm* [2.6 - 3.4] RV Mid:nm* [ < 3.5]  LEFT VENTRICLERV Length:nm* [ < 8.6] LVIDd:5.2 cm[4.2 - 5.9] INFERIOR VENA CAVA LVIDs:4.3 cmMax. IVC:nm* [ <= 2.1]  FS:16.6 %[> 25]Min. IVC:nm* SWT:0.80 cm [0.6 - 1.0] ------------------ PWT:0.67 cm [0.6 - 1.0] nm* - not measured  LEFT ATRIUM LA Diam:4.4 cm[3.0 - 4.0] LA A4C Area:nm* [ < 20] LA Volume:nm* Roice.Felt - 58]  ___________________________________________________________________________________________ ECHOCARDIOGRAPHIC DESCRIPTIONS  AORTIC ROOT Size:Normal Dissection:INDETERM FOR DISSECTION  AORTIC VALVE Leaflets:TricuspidMorphology:Normal Mobility:Fully mobile  LEFT VENTRICLE Size:Normal Anterior:HYPOCONTRACTILE  Contraction:MOD GLOBAL DECREASE Lateral:HYPOCONTRACTILE Closest EF:30% (Estimated)Septal:HYPOCONTRACTILE  LV Masses:No MassesApical:HYPOCONTRACTILE  [id number] Inferior:HYPOCONTRACTILE  Posterior:HYPOCONTRACTILE Dias.FxClass:can't be determined  MITRAL  VALVE Leaflets:Normal Mobility:Fully mobile Morphology:Normal  LEFT ATRIUM Size:MODERATELY ENLARGED LA Masses:No masses  IA Septum:Normal IAS  MAIN PA Size:Normal  PULMONIC VALVE Morphology:Normal Mobility:Fully mobile  RIGHT VENTRICLE  RV Masses:No MassesSize:Normal  Free Wall:NormalContraction:Normal  TRICUSPID VALVE Leaflets:Normal Mobility:Fully mobile Morphology:Normal  RIGHT ATRIUM Size:MODERATELY ENLARGEDRA Other:None  RA Mass:No masses  PERICARDIUM  Fluid:No effusion  INFERIOR VENACAVA Size:DILATED Normal respiratory collapse   ___________________________________________________________________ DOPPLER ECHO and OTHER SPECIAL PROCEDURES  Aortic:No AR No AS 86.2 cm/sec peak vel3.0 mmHg peak grad 2.0 mmHg mean grad2.1 cm^2 by DOPPLER   Mitral:SEVERE MR No MS MV Inflow E Vel=78.0 cm/sec MV Annulus E'Vel=nm* E/E'Ratio=nm*  Tricuspid:SEVERE TR No TS 303.0 cm/sec peak TR vel46.8 mmHg peak RV pressure  Pulmonary:TRIVIAL PRNo PS     ___________________________________________________________________________________________ INTERPRETATION MODERATE LV SYSTOLIC DYSFUNCTION WITH AN ESTIMATED EF = 30 % NORMAL RIGHT VENTRICULAR SYSTOLIC FUNCTION SEVERE TRICUSPID AND MITRAL VALVE INSUFFICIENCY NO VALVULAR STENOSIS MODERATE BIATRIAL ENLARGEMENT   ___________________________________________________________________________________________ Electronically signed by: MD Serafina Royals on 07/07/2015 03:26 PM Performed By: Johnathan Hausen, RDCS, RVT Ordering Physician: Serafina Royals ___________________________________________________________________________________________  Status Results Details

## 2015-11-19 NOTE — Pre-Procedure Instructions (Addendum)
Chief Complaint: Chief Complaint  Patient presents with  . Follow-up  3 mon  . Pre-op Exam  Dr. Marry Guan RIght Total Knee Replacement 12/03/15  . Atrial Fibrillation  Date of Service: 11/06/2015 Date of Birth: Apr 01, 1931 PCP: Allena Earing, MD  History of Present Illness: Mr. Partington is a 82 y.o.male patient  Limb pain The patient has left lower knee extremity pain with and without physical activity. These symptoms are worsening with increased severity over the last 4 months relieved by position change. Other associated signs and symptoms include disability, instability and tenderness  Mitral Valve Insufficiency The patient has had a diagnosis of non rheumatic mitral valve insufficiency recently. This diagnosis has been made by echocardiogram recently. The patient has had symptoms of mitral valve insufficiency including dyspnea and fatigue associated with walking fast and relived by rest stable. Current associated causes include Hypertension, cardiomegally, atrial fibrillation and cardiomyopathy  Congestive heart failure The patient has chronic moderate systolic heart failure secondary to Non-ischemic. Congestive Heart Failure has been manifested by dyspnea and fatigue with a [place] Functional Class of III. The Patient appears euvolemic. and is on beta-blocker without diuretics stable over the last 3 years  Chronic non-valvular atrial fibrillation The patient has chronic non-valvular atrial fibrillation for years with symptoms including fatigue and irregular heart beat currently treated with metoprolol with heart rate control stable. The patient has risk factor and causes of atrial fibrillation including hypertension, valvular heart disease and sick sinus syndrome. The identified symptoms associated with this atrial fibrillation have altered the patient's quality of life and therefore has a severity of atrial fibrillation score of 1. The patient has been on anticoagulation for risk reduction of  stroke with atrial fibrillation Preoperative Assessment Profile for Knee Surgery Risk factors for cardiac complication with surgery: Positive for: Cardiomyopathy and/or congestive heart failure and peripheral vascular disease Negative for: Diabetes, Coronary artery disease and Chronic Kidney disease Active cardiovascular conditions: Positive [id number] Negative for: Angina or anginal equivalent, Recent infartion, Congestive heart failure symptoms and Symptomatic valve disease Functional capacity >4 Mets Risk of surgery and/or procedure low risk Possible need and adjustments in therapy prior to surgery yes Overall risk of cardiac complication with surgery and/or procedure is low, <1%  Past Medical and Surgical History  Past Medical History Past Medical History:  Diagnosis Date  . Atrial fibrillation (CMS-HCC)  . Bipolar affective disorder (CMS-HCC)  . CVA (cerebral vascular accident) ([place]) 11/2009  . Depression  . Dilated cardiomyopathy (CMS-HCC)  . Dyslipidemia  . GAD (generalized anxiety disorder)  . GERD (gastroesophageal reflux disease)  . History of basal cell cancer 2008  shoulder  . History of kidney stones  . Macular degeneration  . Melanoma (CMS-HCC)  on Scalp  . Osteoarthritis  . Stroke (CMS-HCC)  11/2009  . VHD (valvular heart disease)   Past Surgical History He has a past surgical history that includes Melanoma removed from scalp and Back surgery.   Medications and Allergies  Current Medications  Current Outpatient Prescriptions  Medication Sig Dispense Refill  . ascorbic acid, vitamin C, (VITAMIN C) 500 MG tablet Take 500 mg by mouth 2 (two) times daily. Take 500 mg by mouth 4 (four) times daily  . buPROPion (WELLBUTRIN) 75 MG tablet Take by mouth. Take 1 tablet (75 mg total) by mouth every morning.  . cholecalciferol, vitamin D3, (VITAMIN D3) 5,000 unit Tab Take by mouth. Take 1 tablet by mouth 2 (two) times daily  . cyanocobalamin, vitamin  B-12, (VITAMIN B-12) 2,500  mcg Subl Take 2 tabs by mouth daily  . escitalopram oxalate (LEXAPRO) 10 MG tablet Take by mouth. Take 1.5 tablets (15 mg total) by mouth every morning  . lutein 6 mg Cap Take 1 tablet by mouth once daily. Reported on 06/24/2015  . metoprolol tartrate (LOPRESSOR) 25 MG tablet Take 1 tablet (25 mg total) by mouth 2 (two) times daily. 180 tablet 4  . potassium chloride (K-DUR, KLOR-CON) 10 mEq ER tablet Take 1 tablet by mouth once daily.  . saw palmetto fruit 450 mg Cap Take 1 capsule by mouth 2 (two) times daily. Reported on 06/24/2015  . taurine 500 mg Cap Take 1 tablet by mouth 2 (two) times daily.   [name] Kitchen thyroid, pork, (NATURE-THROID) 48.75 mg Tab Take 1 tablet by mouth once daily.  [name] Kitchen thyroid, pork, 325 mg Tab Take 88.25 mg by mouth once daily. Reported on 06/24/2015  . tyrosine 500 mg Cap Take 1 tablet by mouth once daily. Reported on 06/24/2015  . warfarin (COUMADIN) 2.5 MG tablet Take 1 tablet (2.5 mg total) by mouth every other day. (Patient taking differently: Take 2.5 mg by mouth. 6 days a week ) 90 tablet 3  . warfarin (COUMADIN) 5 MG tablet Take 1 tablet (5 mg total) by mouth every other day. (Patient taking differently: Take 5 mg by mouth. 1 day a week ) 90 tablet 3  . baclofen (LIORESAL) 10 MG tablet Take 0.5 tablets (5 mg total) by mouth 2 (two) times daily as needed. 180 tablet 0   No current facility-administered medications for this visit.   Allergies: Review of patient's allergies indicates no known allergies.  Social and Family History  Social History reports that he has never smoked. He has never used smokeless tobacco. He reports that he does not drink alcohol or use illicit drugs.  Family History Family History  Problem Relation Age of Onset  . Heart attack Father  . Stroke Father  . Dementia Brother  . Heart attack Brother  . Prostate cancer Brother  . Hypertension Brother   Review of Systems   Review of Systems  Positive for limb  pain Negative for weight gain weight loss, weakness, vision change, hearing loss, cough, congestion, PND, orthopnea, heartburn, nausea, diaphoresis, vomiting, diarrhea, bloody stool, melena, stomach pain, leg weakness, leg cramping, leg blood clots, headache, blackouts, nosebleed, trouble swallowing, mouth pain, urinary frequency, urination at night, skin lesions, skin rashes, tingling ,ulcers, numbness, anxiety, and/or depression Physical Examination   Vitals:BP 110/80 (BP Location: Left upper arm, Patient Position: Sitting, BP Cuff Size: Adult)  Pulse 89  Resp 15  Ht 168.9 cm (5' 6.5")  Wt 71.4 kg (157 lb 6.4 oz)  SpO2 95%  BMI 25.02 kg/m2 Ht:168.9 cm (5' 6.5") Wt:71.4 kg (157 lb 6.4 oz) [id number] surface area is 1.83 meters squared. Body mass index is 25.02 kg/(m^2). Appearance: well appearing I  HEENT: Pupils equally reactive to light and accomodation, no xanthalasma  Neck: Supple, no apparent thyromegaly, masses, or lymphadenopathy  Lungs: normal respiratory effort; no crackles, no rhonchi, no wheezes Heart: irregular rate and rhythm. Normal S1 S2 No gallops, murmur, no rub, PMI is normal size and placement. carotid upstroke normal without bruit. Jugular venous pressure is normal Abdomen: soft, nontender, not distended with normal bowel sounds. No apparent hepatosplenomegally. Abdominal aorta is normal size  Extremities: no edema, no ulcers, no clubbing, no cyanosis Peripheral Pulses: 2+ in upper extremities, 2+ femoral pulses bilaterally, 2+lower extremity  Musculoskeletal; Normal muscle tone without kyphosis Neurological:  Oriented and Alert, Cranial nerves intact  Assessment   82 y.o. male with  Encounter Diagnoses  Name Primary?  . Chronic systolic CHF (congestive heart failure), NYHA class 3 (CMS-HCC) Yes  . Chronic a-fib (CMS-HCC)  . Primary osteoarthritis of left knee  . Severe mitral insufficiency   Plan  -Proceed to surgery and/or invasive procedure without restriction.  The patient is at the lowest risk possible for cardiovascular complication as stated above. Patient may discontinue anticoagulation 3-4 days prior to surgical intervention and or procedure for reduced bleeding risk. The patient shall restart at a safe period after procedure when bleeding risk is reduced. Patient understands all the risks and benefits of anticoagulation and discontinuation of anticoagulation perioperatively and the quoted study data that applies  -Continue medical management for risk factor modification and help with mitral insufficiency currently stable at this time. The patient will not need further intervention and or diagnostics at this time. The patient will continue to watch for onset of symptoms possibly related to above. -No changes to medication therapy. for congestive heart failure and resulting symptoms. We have discussed the medication management appropriate for congestive heart failure. We have had a long discussion of low sodium diet and the DASH diet as well and the importance of regular daily activity to assess worsening of symptoms and progression of disease process. -metoprolol tartrate (Lopressor) for heart rate control of atrial fibrillation and/or maintenance of normal sinus rhythm watching closely for any other significant side effects of medications and/or other rhythm disturbances. The patient does understand current treatment goals with the above medication management.  No orders of the defined types were placed in this encounter.  Return in about 6 months (around 05/08/2016).  Flossie Dibble, MD   Cardiac clearance on chart.

## 2015-11-20 LAB — URINE CULTURE
Culture: NO GROWTH
Special Requests: NORMAL

## 2015-12-03 ENCOUNTER — Inpatient Hospital Stay
Admission: RE | Admit: 2015-12-03 | Discharge: 2015-12-05 | DRG: 470 | Disposition: A | Discharge status: Skilled Nursing Facility | Payer: PPO | Source: Ambulatory Visit | Attending: Orthopedic Surgery | Admitting: Orthopedic Surgery

## 2015-12-03 ENCOUNTER — Encounter: Payer: Self-pay | Admitting: Orthopedic Surgery

## 2015-12-03 ENCOUNTER — Inpatient Hospital Stay: Payer: PPO

## 2015-12-03 ENCOUNTER — Encounter
Admission: RE | Disposition: A | Discharge status: Skilled Nursing Facility | Payer: Self-pay | Source: Ambulatory Visit | Attending: Orthopedic Surgery

## 2015-12-03 ENCOUNTER — Inpatient Hospital Stay: Payer: PPO | Admitting: Anesthesiology

## 2015-12-03 DIAGNOSIS — Z85820 Personal history of malignant melanoma of skin: Secondary | ICD-10-CM | POA: Diagnosis not present

## 2015-12-03 DIAGNOSIS — Z96659 Presence of unspecified artificial knee joint: Secondary | ICD-10-CM

## 2015-12-03 DIAGNOSIS — Z8042 Family history of malignant neoplasm of prostate: Secondary | ICD-10-CM

## 2015-12-03 DIAGNOSIS — Z79899 Other long term (current) drug therapy: Secondary | ICD-10-CM | POA: Diagnosis not present

## 2015-12-03 DIAGNOSIS — Z8249 Family history of ischemic heart disease and other diseases of the circulatory system: Secondary | ICD-10-CM | POA: Diagnosis not present

## 2015-12-03 DIAGNOSIS — Z96652 Presence of left artificial knee joint: Secondary | ICD-10-CM | POA: Diagnosis not present

## 2015-12-03 DIAGNOSIS — K219 Gastro-esophageal reflux disease without esophagitis: Secondary | ICD-10-CM | POA: Diagnosis not present

## 2015-12-03 DIAGNOSIS — Z823 Family history of stroke: Secondary | ICD-10-CM

## 2015-12-03 DIAGNOSIS — I4891 Unspecified atrial fibrillation: Secondary | ICD-10-CM | POA: Diagnosis present

## 2015-12-03 DIAGNOSIS — M1712 Unilateral primary osteoarthritis, left knee: Secondary | ICD-10-CM | POA: Diagnosis not present

## 2015-12-03 DIAGNOSIS — I739 Peripheral vascular disease, unspecified: Secondary | ICD-10-CM | POA: Diagnosis not present

## 2015-12-03 DIAGNOSIS — H3530 Unspecified macular degeneration: Secondary | ICD-10-CM | POA: Diagnosis present

## 2015-12-03 DIAGNOSIS — F319 Bipolar disorder, unspecified: Secondary | ICD-10-CM | POA: Diagnosis present

## 2015-12-03 DIAGNOSIS — Z471 Aftercare following joint replacement surgery: Secondary | ICD-10-CM | POA: Diagnosis not present

## 2015-12-03 DIAGNOSIS — Z87442 Personal history of urinary calculi: Secondary | ICD-10-CM | POA: Diagnosis not present

## 2015-12-03 DIAGNOSIS — M179 Osteoarthritis of knee, unspecified: Secondary | ICD-10-CM | POA: Diagnosis not present

## 2015-12-03 DIAGNOSIS — F411 Generalized anxiety disorder: Secondary | ICD-10-CM | POA: Diagnosis present

## 2015-12-03 DIAGNOSIS — Z8673 Personal history of transient ischemic attack (TIA), and cerebral infarction without residual deficits: Secondary | ICD-10-CM | POA: Diagnosis not present

## 2015-12-03 DIAGNOSIS — Z7901 Long term (current) use of anticoagulants: Secondary | ICD-10-CM

## 2015-12-03 HISTORY — PX: KNEE ARTHROPLASTY: SHX992

## 2015-12-03 LAB — PROTIME-INR
INR: 1.14
Prothrombin Time: 14.7 seconds (ref 11.4–15.2)

## 2015-12-03 SURGERY — ARTHROPLASTY, KNEE, TOTAL, USING IMAGELESS COMPUTER-ASSISTED NAVIGATION
Anesthesia: Spinal | Site: Knee | Laterality: Left | Wound class: Clean

## 2015-12-03 MED ORDER — TRAMADOL HCL 50 MG PO TABS
50.0000 mg | ORAL_TABLET | ORAL | Status: DC | PRN
  Administered 2015-12-03 – 2015-12-04 (×2): 100 mg via ORAL
  Filled 2015-12-03 (×2): qty 2

## 2015-12-03 MED ORDER — PHENOL 1.4 % MT LIQD
1.0000 | OROMUCOSAL | Status: DC | PRN
  Filled 2015-12-03: qty 177

## 2015-12-03 MED ORDER — CEFAZOLIN SODIUM-DEXTROSE 2-4 GM/100ML-% IV SOLN
2.0000 g | INTRAVENOUS | Status: AC
  Administered 2015-12-03: 2 g via INTRAVENOUS

## 2015-12-03 MED ORDER — METOCLOPRAMIDE HCL 10 MG PO TABS
10.0000 mg | ORAL_TABLET | Freq: Three times a day (TID) | ORAL | Status: AC
  Administered 2015-12-03 – 2015-12-05 (×5): 10 mg via ORAL
  Filled 2015-12-03 (×5): qty 1

## 2015-12-03 MED ORDER — ACETAMINOPHEN 325 MG PO TABS
650.0000 mg | ORAL_TABLET | Freq: Four times a day (QID) | ORAL | Status: DC | PRN
Start: 2015-12-03 — End: 2015-12-05

## 2015-12-03 MED ORDER — NEOMYCIN-POLYMYXIN B GU 40-200000 IR SOLN
Status: DC | PRN
  Administered 2015-12-03: 14 mL

## 2015-12-03 MED ORDER — TRANEXAMIC ACID 1000 MG/10ML IV SOLN
1000.0000 mg | Freq: Once | INTRAVENOUS | Status: AC
  Administered 2015-12-03: 1000 mg via INTRAVENOUS
  Filled 2015-12-03: qty 10

## 2015-12-03 MED ORDER — FERROUS SULFATE 325 (65 FE) MG PO TABS
325.0000 mg | ORAL_TABLET | Freq: Two times a day (BID) | ORAL | Status: DC
  Administered 2015-12-04 – 2015-12-05 (×3): 325 mg via ORAL
  Filled 2015-12-03 (×3): qty 1

## 2015-12-03 MED ORDER — ALUM & MAG HYDROXIDE-SIMETH 200-200-20 MG/5ML PO SUSP: 30.0000 mL | ORAL | Status: DC | PRN

## 2015-12-03 MED ORDER — VITAMIN B-6 50 MG PO TABS
25.0000 mg | ORAL_TABLET | Freq: Two times a day (BID) | ORAL | Status: DC
  Administered 2015-12-03 – 2015-12-04 (×3): 25 mg via ORAL
  Filled 2015-12-03: qty 1
  Filled 2015-12-03 (×4): qty 0.5

## 2015-12-03 MED ORDER — SODIUM CHLORIDE FLUSH 0.9 % IV SOLN
INTRAVENOUS | Status: AC
  Filled 2015-12-03: qty 10

## 2015-12-03 MED ORDER — FAMOTIDINE 20 MG PO TABS
ORAL_TABLET | ORAL | Status: AC
  Administered 2015-12-03: 20 mg via ORAL
  Filled 2015-12-03: qty 1

## 2015-12-03 MED ORDER — CEFAZOLIN SODIUM-DEXTROSE 2-4 GM/100ML-% IV SOLN
2.0000 g | Freq: Four times a day (QID) | INTRAVENOUS | Status: AC
  Administered 2015-12-03 – 2015-12-04 (×3): 2 g via INTRAVENOUS
  Filled 2015-12-03 (×4): qty 100

## 2015-12-03 MED ORDER — ESCITALOPRAM OXALATE 10 MG PO TABS
10.0000 mg | ORAL_TABLET | ORAL | Status: DC
  Administered 2015-12-04 – 2015-12-05 (×3): 10 mg via ORAL
  Filled 2015-12-03 (×3): qty 1

## 2015-12-03 MED ORDER — MENTHOL 3 MG MT LOZG
1.0000 | LOZENGE | OROMUCOSAL | Status: DC | PRN
  Filled 2015-12-03: qty 9

## 2015-12-03 MED ORDER — TETRACAINE HCL 1 % IJ SOLN
INTRAMUSCULAR | Status: DC | PRN
  Administered 2015-12-03: 10 mg via INTRASPINAL

## 2015-12-03 MED ORDER — WARFARIN SODIUM 5 MG PO TABS
2.5000 mg | ORAL_TABLET | Freq: Every day | ORAL | Status: DC
  Administered 2015-12-03 – 2015-12-05 (×3): 2.5 mg via ORAL
  Filled 2015-12-03 (×3): qty 1

## 2015-12-03 MED ORDER — OXYCODONE HCL 5 MG PO TABS
5.0000 mg | ORAL_TABLET | ORAL | Status: DC | PRN
  Administered 2015-12-03 – 2015-12-04 (×5): 10 mg via ORAL
  Administered 2015-12-05: 5 mg via ORAL
  Filled 2015-12-03: qty 1
  Filled 2015-12-03: qty 2
  Filled 2015-12-03: qty 1
  Filled 2015-12-03 (×2): qty 2
  Filled 2015-12-03: qty 1
  Filled 2015-12-03: qty 2

## 2015-12-03 MED ORDER — WARFARIN - PHYSICIAN DOSING INPATIENT: Freq: Every day | Status: DC

## 2015-12-03 MED ORDER — SENNOSIDES-DOCUSATE SODIUM 8.6-50 MG PO TABS
1.0000 | ORAL_TABLET | Freq: Two times a day (BID) | ORAL | Status: DC
  Administered 2015-12-03 – 2015-12-05 (×5): 1 via ORAL
  Filled 2015-12-03 (×4): qty 1

## 2015-12-03 MED ORDER — POTASSIUM CHLORIDE CRYS ER 10 MEQ PO TBCR
10.0000 meq | EXTENDED_RELEASE_TABLET | Freq: Every day | ORAL | Status: DC
  Administered 2015-12-03 – 2015-12-05 (×4): 10 meq via ORAL
  Filled 2015-12-03 (×3): qty 1

## 2015-12-03 MED ORDER — NEOMYCIN-POLYMYXIN B GU 40-200000 IR SOLN
Status: AC
  Filled 2015-12-03: qty 20

## 2015-12-03 MED ORDER — CHLORHEXIDINE GLUCONATE 4 % EX LIQD
60.0000 mL | Freq: Once | CUTANEOUS | Status: DC
Start: 2015-12-03 — End: 2015-12-03

## 2015-12-03 MED ORDER — FENTANYL CITRATE (PF) 100 MCG/2ML IJ SOLN: 25.0000 ug | INTRAMUSCULAR | Status: DC | PRN

## 2015-12-03 MED ORDER — BUPROPION HCL 75 MG PO TABS
75.0000 mg | ORAL_TABLET | Freq: Every morning | ORAL | Status: DC
  Administered 2015-12-03 – 2015-12-05 (×4): 75 mg via ORAL
  Filled 2015-12-03 (×3): qty 1

## 2015-12-03 MED ORDER — THYROID 81.25 MG PO TABS: 1.0000 | ORAL_TABLET | Freq: Every day | ORAL | Status: DC

## 2015-12-03 MED ORDER — METOPROLOL TARTRATE 25 MG PO TABS
25.0000 mg | ORAL_TABLET | Freq: Two times a day (BID) | ORAL | Status: DC
  Administered 2015-12-03 – 2015-12-05 (×6): 25 mg via ORAL
  Filled 2015-12-03 (×5): qty 1

## 2015-12-03 MED ORDER — MAGNESIUM HYDROXIDE 400 MG/5ML PO SUSP
30.0000 mL | Freq: Every day | ORAL | Status: DC | PRN
  Administered 2015-12-04: 30 mL via ORAL
  Filled 2015-12-03: qty 30

## 2015-12-03 MED ORDER — ACETAMINOPHEN 650 MG RE SUPP: 650.0000 mg | Freq: Four times a day (QID) | RECTAL | Status: DC | PRN

## 2015-12-03 MED ORDER — ONDANSETRON HCL 4 MG/2ML IJ SOLN
4.0000 mg | Freq: Four times a day (QID) | INTRAMUSCULAR | Status: DC | PRN
  Administered 2015-12-03: 4 mg via INTRAVENOUS
  Filled 2015-12-03: qty 2

## 2015-12-03 MED ORDER — ACETAMINOPHEN 10 MG/ML IV SOLN
1000.0000 mg | Freq: Four times a day (QID) | INTRAVENOUS | Status: AC
  Administered 2015-12-03 – 2015-12-04 (×4): 1000 mg via INTRAVENOUS
  Filled 2015-12-03 (×5): qty 100

## 2015-12-03 MED ORDER — SODIUM CHLORIDE 0.9 % IJ SOLN
INTRAMUSCULAR | Status: AC
  Filled 2015-12-03: qty 50

## 2015-12-03 MED ORDER — FAMOTIDINE 20 MG PO TABS
20.0000 mg | ORAL_TABLET | Freq: Once | ORAL | Status: AC
  Administered 2015-12-03: 20 mg via ORAL

## 2015-12-03 MED ORDER — TRANEXAMIC ACID 1000 MG/10ML IV SOLN
1000.0000 mg | INTRAVENOUS | Status: AC
  Administered 2015-12-03: 1000 mg via INTRAVENOUS
  Filled 2015-12-03: qty 10

## 2015-12-03 MED ORDER — PROPOFOL 500 MG/50ML IV EMUL
INTRAVENOUS | Status: DC | PRN
  Administered 2015-12-03 (×2): 25 ug/kg/min via INTRAVENOUS

## 2015-12-03 MED ORDER — FLEET ENEMA 7-19 GM/118ML RE ENEM
1.0000 | ENEMA | Freq: Once | RECTAL | Status: AC | PRN
  Administered 2015-12-05: 1 via RECTAL

## 2015-12-03 MED ORDER — CEFAZOLIN SODIUM-DEXTROSE 2-4 GM/100ML-% IV SOLN
INTRAVENOUS | Status: AC
  Filled 2015-12-03: qty 100

## 2015-12-03 MED ORDER — BUPIVACAINE LIPOSOME 1.3 % IJ SUSP
INTRAMUSCULAR | Status: AC
  Filled 2015-12-03: qty 20

## 2015-12-03 MED ORDER — MORPHINE SULFATE (PF) 2 MG/ML IV SOLN: 2.0000 mg | INTRAVENOUS | Status: DC | PRN

## 2015-12-03 MED ORDER — BISACODYL 10 MG RE SUPP
10.0000 mg | Freq: Every day | RECTAL | Status: DC | PRN
  Administered 2015-12-05: 10 mg via RECTAL
  Filled 2015-12-03: qty 1

## 2015-12-03 MED ORDER — OXYCODONE HCL 5 MG/5ML PO SOLN: 5.0000 mg | Freq: Once | ORAL | Status: DC | PRN

## 2015-12-03 MED ORDER — BUPIVACAINE HCL (PF) 0.25 % IJ SOLN
INTRAMUSCULAR | Status: DC | PRN
  Administered 2015-12-03: 60 mL

## 2015-12-03 MED ORDER — MIDAZOLAM HCL 5 MG/5ML IJ SOLN
INTRAMUSCULAR | Status: DC | PRN
  Administered 2015-12-03 (×2): 1 mg via INTRAVENOUS

## 2015-12-03 MED ORDER — ONDANSETRON HCL 4 MG PO TABS
4.0000 mg | ORAL_TABLET | Freq: Four times a day (QID) | ORAL | Status: DC | PRN
  Administered 2015-12-04: 4 mg via ORAL
  Filled 2015-12-03: qty 1

## 2015-12-03 MED ORDER — SODIUM CHLORIDE 0.9 % IV SOLN
INTRAVENOUS | Status: DC | PRN
  Administered 2015-12-03: 60 mL

## 2015-12-03 MED ORDER — PANTOPRAZOLE SODIUM 40 MG PO TBEC
40.0000 mg | DELAYED_RELEASE_TABLET | Freq: Two times a day (BID) | ORAL | Status: DC
  Administered 2015-12-04 – 2015-12-05 (×3): 40 mg via ORAL
  Filled 2015-12-03 (×3): qty 1

## 2015-12-03 MED ORDER — DIPHENHYDRAMINE HCL 12.5 MG/5ML PO ELIX: 12.5000 mg | ORAL_SOLUTION | ORAL | Status: DC | PRN

## 2015-12-03 MED ORDER — VITAMIN B-12 2500 MCG SL SUBL
5000.0000 ug | SUBLINGUAL_TABLET | Freq: Every day | SUBLINGUAL | Status: DC
  Administered 2015-12-03: 5000 ug via SUBLINGUAL
  Filled 2015-12-03 (×2): qty 2

## 2015-12-03 MED ORDER — BUPIVACAINE HCL (PF) 0.25 % IJ SOLN
INTRAMUSCULAR | Status: AC
  Filled 2015-12-03: qty 60

## 2015-12-03 MED ORDER — VITAMIN D 1000 UNITS PO TABS
ORAL_TABLET | Freq: Every day | ORAL | Status: DC
  Administered 2015-12-04 – 2015-12-05 (×3): 5000 [IU] via ORAL
  Filled 2015-12-03 (×3): qty 5

## 2015-12-03 MED ORDER — LACTATED RINGERS IV SOLN
INTRAVENOUS | Status: DC
  Administered 2015-12-03: 07:00:00 via INTRAVENOUS

## 2015-12-03 MED ORDER — OXYCODONE HCL 5 MG PO TABS: 5.0000 mg | ORAL_TABLET | Freq: Once | ORAL | Status: DC | PRN

## 2015-12-03 MED ORDER — BUPIVACAINE HCL (PF) 0.5 % IJ SOLN
INTRAMUSCULAR | Status: DC | PRN
  Administered 2015-12-03: 2 mL

## 2015-12-03 MED ORDER — VITAMIN C 500 MG PO TABS
500.0000 mg | ORAL_TABLET | Freq: Two times a day (BID) | ORAL | Status: DC
  Administered 2015-12-03 – 2015-12-05 (×5): 500 mg via ORAL
  Filled 2015-12-03 (×4): qty 1

## 2015-12-03 MED ORDER — SODIUM CHLORIDE 0.9 % IV SOLN
INTRAVENOUS | Status: DC
  Administered 2015-12-03: 1000 mL via INTRAVENOUS

## 2015-12-03 MED ORDER — B COMPLEX-C PO TABS
1.0000 | ORAL_TABLET | Freq: Two times a day (BID) | ORAL | Status: DC
  Administered 2015-12-03 – 2015-12-04 (×3): 1 via ORAL
  Filled 2015-12-03 (×5): qty 1

## 2015-12-03 MED ORDER — B COMPLEX PO TABS: 1.0000 | ORAL_TABLET | Freq: Two times a day (BID) | ORAL | Status: DC

## 2015-12-03 SURGICAL SUPPLY — 58 items
AUTOTRANSFUS HAS 1/8 (MISCELLANEOUS) ×3
BATTERY INSTRU NAVIGATION (MISCELLANEOUS) ×12 IMPLANT
BLADE SAW 1 (BLADE) ×3 IMPLANT
BLADE SAW 1/2 (BLADE) ×3 IMPLANT
CANISTER SUCT 1200ML W/VALVE (MISCELLANEOUS) ×3 IMPLANT
CANISTER SUCT 3000ML (MISCELLANEOUS) ×6 IMPLANT
CAPT KNEE TOTAL 3 ATTUNE ×3 IMPLANT
CATH TRAY METER 16FR LF (MISCELLANEOUS) ×3 IMPLANT
CEMENT HV SMART SET (Cement) ×6 IMPLANT
COOLER POLAR GLACIER W/PUMP (MISCELLANEOUS) ×3 IMPLANT
CUFF TOURN 24 STER (MISCELLANEOUS) ×3 IMPLANT
CUFF TOURN 30 STER DUAL PORT (MISCELLANEOUS) IMPLANT
DRAPE SHEET LG 3/4 BI-LAMINATE (DRAPES) ×3 IMPLANT
DRSG DERMACEA 8X12 NADH (GAUZE/BANDAGES/DRESSINGS) ×3 IMPLANT
DRSG OPSITE POSTOP 4X14 (GAUZE/BANDAGES/DRESSINGS) ×3 IMPLANT
DRSG TEGADERM 4X4.75 (GAUZE/BANDAGES/DRESSINGS) ×3 IMPLANT
DURAPREP 26ML APPLICATOR (WOUND CARE) ×6 IMPLANT
ELECT CAUTERY BLADE 6.4 (BLADE) ×3 IMPLANT
ELECT REM PT RETURN 9FT ADLT (ELECTROSURGICAL) ×3
ELECTRODE REM PT RTRN 9FT ADLT (ELECTROSURGICAL) ×1 IMPLANT
EX-PIN ORTHOLOCK NAV 4X150 (PIN) ×6 IMPLANT
GLOVE BIOGEL M STRL SZ7.5 (GLOVE) ×6 IMPLANT
GLOVE INDICATOR 8.0 STRL GRN (GLOVE) ×3 IMPLANT
GLOVE SURG 9.0 ORTHO LTXF (GLOVE) ×3 IMPLANT
GLOVE SURG ORTHO 9.0 STRL STRW (GLOVE) ×3 IMPLANT
GOWN STRL REUS W/ TWL LRG LVL3 (GOWN DISPOSABLE) ×2 IMPLANT
GOWN STRL REUS W/TWL 2XL LVL3 (GOWN DISPOSABLE) ×3 IMPLANT
GOWN STRL REUS W/TWL LRG LVL3 (GOWN DISPOSABLE) ×4
HANDPIECE INTERPULSE COAX TIP (DISPOSABLE) ×2
HOLDER FOLEY CATH W/STRAP (MISCELLANEOUS) ×3 IMPLANT
HOOD PEEL AWAY FLYTE STAYCOOL (MISCELLANEOUS) ×6 IMPLANT
KIT RM TURNOVER STRD PROC AR (KITS) ×3 IMPLANT
KNIFE SCULPS 14X20 (INSTRUMENTS) ×3 IMPLANT
LABEL OR SOLS (LABEL) ×3 IMPLANT
NDL SAFETY 18GX1.5 (NEEDLE) ×3 IMPLANT
NEEDLE SPNL 20GX3.5 QUINCKE YW (NEEDLE) ×3 IMPLANT
NS IRRIG 500ML POUR BTL (IV SOLUTION) ×3 IMPLANT
PACK TOTAL KNEE (MISCELLANEOUS) ×3 IMPLANT
PAD WRAPON POLAR KNEE (MISCELLANEOUS) ×1 IMPLANT
PIN FIXATION 1/8DIA X 3INL (PIN) ×3 IMPLANT
SET HNDPC FAN SPRY TIP SCT (DISPOSABLE) ×1 IMPLANT
SOL .9 NS 3000ML IRR  AL (IV SOLUTION) ×2
SOL .9 NS 3000ML IRR UROMATIC (IV SOLUTION) ×1 IMPLANT
SOL PREP PVP 2OZ (MISCELLANEOUS) ×3
SOLUTION PREP PVP 2OZ (MISCELLANEOUS) ×1 IMPLANT
SPONGE DRAIN TRACH 4X4 STRL 2S (GAUZE/BANDAGES/DRESSINGS) ×3 IMPLANT
STAPLER SKIN PROX 35W (STAPLE) ×3 IMPLANT
SUCTION FRAZIER HANDLE 10FR (MISCELLANEOUS) ×2
SUCTION TUBE FRAZIER 10FR DISP (MISCELLANEOUS) ×1 IMPLANT
SUT VIC AB 0 CT1 36 (SUTURE) ×3 IMPLANT
SUT VIC AB 1 CT1 36 (SUTURE) ×6 IMPLANT
SUT VIC AB 2-0 CT2 27 (SUTURE) ×3 IMPLANT
SYR 20CC LL (SYRINGE) ×3 IMPLANT
SYR 30ML LL (SYRINGE) ×6 IMPLANT
SYSTEM AUTOTRANSFUS DUAL TROCR (MISCELLANEOUS) ×1 IMPLANT
TOWEL OR 17X26 4PK STRL BLUE (TOWEL DISPOSABLE) ×3 IMPLANT
TOWER CARTRIDGE SMART MIX (DISPOSABLE) ×3 IMPLANT
WRAPON POLAR PAD KNEE (MISCELLANEOUS) ×3

## 2015-12-03 NOTE — Anesthesia Procedure Notes (Signed)
Spinal  Patient location during procedure: OR Start time: 12/03/2015 7:18 AM End time: 12/03/2015 7:20 AM Staffing Anesthesiologist: Katy Fitch K Performed: anesthesiologist  Preanesthetic Checklist Completed: patient identified, site marked, surgical consent, pre-op evaluation, timeout performed, IV checked, risks and benefits discussed and monitors and equipment checked Spinal Block Patient position: sitting Prep: ChloraPrep Patient monitoring: heart rate, continuous pulse ox, blood pressure and cardiac monitor Approach: midline Location: L4-5 Injection technique: single-shot Needle Needle type: Whitacre and Introducer  Needle gauge: 24 G Needle length: 9 cm Assessment Sensory level: T10 Additional Notes Negative paresthesia. Negative blood return. Positive free-flowing CSF. Expiration date of kit checked and confirmed. Patient tolerated procedure well, without complications.

## 2015-12-03 NOTE — Anesthesia Preprocedure Evaluation (Signed)
Anesthesia Evaluation  Patient identified by MRN, date of birth, ID band Patient awake    Reviewed: Allergy & Precautions, H&P , NPO status , Patient's Chart, lab work & pertinent test results  History of Anesthesia Complications Negative for: history of anesthetic complications  Airway Mallampati: III  TM Distance: <3 FB Neck ROM: limited    Dental  (+) Poor Dentition, Upper Dentures, Lower Dentures, Missing   Pulmonary shortness of breath and with exertion,    Pulmonary exam normal breath sounds clear to auscultation       Cardiovascular Exercise Tolerance: Good (-) angina+ Peripheral Vascular Disease  Normal cardiovascular exam+ dysrhythmias Atrial Fibrillation  Rhythm:regular Rate:Normal     Neuro/Psych PSYCHIATRIC DISORDERS Anxiety Bipolar Disorder CVA, Residual Symptoms    GI/Hepatic Neg liver ROS, GERD  Controlled,  Endo/Other  negative endocrine ROS  Renal/GU negative Renal ROS  negative genitourinary   Musculoskeletal  (+) Arthritis ,   Abdominal   Peds  Hematology negative hematology ROS (+)   Anesthesia Other Findings Past Medical History: No date: Anxiety No date: Atrial fibrillation [facility]) 2008: Basal cell cancer     Comment: on shoulder No date: Bipolar disorder [facility])     Comment: hospitalized in past 8/11: CVA (cerebral vascular accident) ([place]) No date: Generalized anxiety disorder No date: GERD (gastroesophageal reflux disease) No date: History of kidney stones     Comment: 30 years ago No date: Macular degeneration No date: Osteoarthritis     Comment: in neck and left knee No date: Tremor     Comment: noted when writing  Past Surgical History: 1991: BACK SURGERY No date: CATARACT EXTRACTION W/ INTRAOCULAR LENS IMPLANT Bilateral No date: SKIN CANCER EXCISION  BMI    Body Mass Index:  25.82 kg/m      Reproductive/Obstetrics negative OB ROS                              Anesthesia Physical Anesthesia Plan  ASA: III  Anesthesia Plan: Spinal   Post-op Pain Management:    Induction:   Airway Management Planned:   Additional Equipment:   Intra-op Plan:   Post-operative Plan:   Informed Consent: I have reviewed the patients History and Physical, chart, labs and discussed the procedure including the risks, benefits and alternatives for the proposed anesthesia with the patient or authorized representative who has indicated his/her understanding and acceptance.   Dental Advisory Given  Plan Discussed with: Anesthesiologist, CRNA and Surgeon  Anesthesia Plan Comments: (Patient has had prior back surgery, reports no hardware, plan to attempt spinal first.)        Anesthesia Quick Evaluation

## 2015-12-03 NOTE — Evaluation (Signed)
Physical Therapy Evaluation Patient Details Name: David Duncan MRN: [id number] DOB: [birth year] Today's Date: 12/03/2015   History of Present Illness  Pt is a 82 yr old male s/p elective L TKA 8/23. PMH significant for AFib, basal cell CA, CHF, anxiety, bipolar disorder, L shoulder bursitis, and h/o R sided CVA 2011.  Clinical Impression  Prior to admission, pt was independent with all functional mobility and basic ADLS (though from time to time required unilateral hand held assist to stand from deep sitting surfaces).  Pt lives with his spouse in a one-story home with 1 STE.  Currently, pt is min A for supine > sit, standby-min A to maintain sitting balance, and mod A x2 for sit <> stand at RW.  Further mobility deferred d/t pt nausea and emesis in sitting.  Pt able to perform SLR x 10 repetitions on the LLE and thus no need for KI.  Pt demonstrates L knee ROM 0-60 deg on POD#0. Pt would benefit from skilled PT to address noted impairments and functional limitations.  Recommend pt discharge to STR when medically appropriate.     Follow Up Recommendations SNF    Equipment Recommendations   (Pt owns RW)    Recommendations for Other Services       Precautions / Restrictions Precautions Precautions: Knee;Fall Precaution Booklet Issued: Yes (comment) Restrictions Weight Bearing Restrictions: Yes LLE Weight Bearing: Weight bearing as tolerated      Mobility  Bed Mobility Overal bed mobility: Needs Assistance Bed Mobility: Supine to Sit;Sit to Supine Supine to sit: Min assist Sit to supine: Mod assist;+2 for physical assistance General bed mobility comments: Supine > sit with BUE assist on bed rails and min A. Increased time and vc's required. Sit > supine with mod A x2 d/t pt feeling nauseus.   Transfers Overall transfer level: Needs assistance Equipment used: Rolling walker (2 wheeled) Transfers: Sit to/from Stand Sit to Stand: Mod assist;+2 physical assistance General  transfer comment: Pt assumes stand with mod A to power up. Initially with flexed posturing and posterior lean requiring mod A x2 and max vc's to correct. In standing, pt feeling dizzy and nauseus and with wish to return supine "I've had enough for today". Prompty upon sitting pt with bout of vomiting.   Ambulation/Gait    Stairs      Balance Overall balance assessment: Needs assistance Sitting-balance support: Feet supported;Bilateral upper extremity supported Sitting balance-Leahy Scale: Poor Sitting balance - Comments: Pt requiring fluctuating assist standby-min A to maintain sitting EOB secondary to posterior lean Postural control: Posterior lean     Pertinent Vitals/Pain Pain Assessment: 0-10 Pain Score: 3  Pain Location: L knee Pain Descriptors / Indicators: Aching Pain Intervention(s): Limited activity within patient's tolerance;Monitored during session;Premedicated before session;Repositioned;Ice applied  HR and O2 monitored throughout session and maintained WFL.     Home Living Family/patient expects to be discharged to:: Private residence Living Arrangements: Spouse/significant other Available Help at Discharge: Family;Friend(s) Type of Home: House Home Access: Stairs to enter Entrance Stairs-Rails: None Entrance Stairs-Number of Steps: 1 Home Layout: One level Home Equipment: Environmental consultant - 2 wheels      Prior Function Level of Independence: Independent  Comments: Occasional unilateral hand held assist to arise from a deep chair but otherwise independent     Hand Dominance        Extremity/Trunk Assessment   Upper Extremity Assessment: Generalized weakness    Lower Extremity Assessment: Generalized weakness Light touch sensation intact bilat; diminished R compared to L (  chronic from 2011 CVA) LLE strength at least 3/5 throughout RLE strength at least 4/5 throughout   Cervical / Trunk Assessment: Normal    Communication   Communication: No difficulties   Cognition Arousal/Alertness: Awake/alert Behavior During Therapy: WFL for tasks assessed/performed Overall Cognitive Status: Within Functional Limits for tasks assessed    General Comments General comments (skin integrity, edema, etc.): Ace bandaging to LLE with hemovac and polar care in place.    Nursing cleared pt for participation in physical therapy.  Pt agreeable to PT session.     Exercises Total Joint Exercises Ankle Circles/Pumps: AROM;Both Quad Sets: AROM;Both Short Arc Quad: AROM;Both Heel Slides: AROM RLE;AAROM LLE Hip ABduction/ADduction: AROM;Both Straight Leg Raises: AROM;Both Goniometric ROM: L knee extension in supine 0 deg, L knee flexion sitting EOB 60 deg  All exercises performed bilaterally x 10 reps in supine.       Assessment/Plan    PT Assessment Patient needs continued PT services  PT Diagnosis Difficulty walking;Generalized weakness;Acute pain   PT Problem List Decreased strength;Decreased range of motion;Decreased activity tolerance;Decreased balance;Decreased mobility;Decreased coordination;Decreased knowledge of use of DME  PT Treatment Interventions DME instruction;Gait training;Therapeutic activities;Functional mobility training;Stair training;Therapeutic exercise;Balance training;Patient/family education   PT Goals (Current goals can be found in the Care Plan section) Acute Rehab PT Goals Patient Stated Goal: To return to PLOF PT Goal Formulation: With patient Time For Goal Achievement: 12/17/15 Potential to Achieve Goals: Good    Frequency BID   Barriers to discharge Assist levels         End of Session Equipment Utilized During Treatment: Gait belt Activity Tolerance:  (Patient limited by nausea) Patient left: in bed;with call bell/phone within reach;with bed alarm set;with family/visitor present;with SCD's reapplied (LLE in bone foam with polar care running, R heel elevated via towel roll) Nurse Communication: Mobility  status;Precautions;Weight bearing status         Time: [id number] PT Time Calculation (min) (ACUTE ONLY): 35 min   Charges:         PT G Codes:        [place], SPT 12/03/2015, 4:37 PM

## 2015-12-03 NOTE — Op Note (Signed)
OPERATIVE NOTE  DATE OF SURGERY:  12/03/2015  PATIENT NAME:  David Duncan   DOB: [birth year]  MRN: [id number]  PRE-OPERATIVE DIAGNOSIS: Degenerative arthrosis of the left knee, primary  POST-OPERATIVE DIAGNOSIS:  Same  PROCEDURE:  Left total knee arthroplasty using computer-assisted navigation  SURGEON:  Marciano Sequin. M.D.  ASSISTANT:  Rachelle Hora, PA-C (present and scrubbed throughout the case, critical for assistance with exposure, retraction, instrumentation, and closure)  ANESTHESIA: spinal  ESTIMATED BLOOD LOSS: 150 mL  FLUIDS REPLACED: 1000 mL of crystalloid  TOURNIQUET TIME: 116 minutes  DRAINS: 2 medium drains to a reinfusion system  SOFT TISSUE RELEASES: Anterior cruciate ligament, posterior cruciate ligament, deep medial collateral ligament, patellofemoral ligament, and posterolateral corner  IMPLANTS UTILIZED: DePuy Attune size 5 posterior stabilized femoral component (cemented), size 5 rotating platform tibial component (cemented), 35 mm medialized dome patella (cemented), and a 5 mm stabilized rotating platform polyethylene insert.  INDICATIONS FOR SURGERY: David Duncan is a 82 y.o. year old male with a long history of progressive knee pain. X-rays demonstrated severe degenerative changes in tricompartmental fashion. The patient had not seen any significant improvement despite conservative nonsurgical intervention. After discussion of the risks and benefits of surgical intervention, the patient expressed understanding of the risks benefits and agree with plans for total knee arthroplasty.   The risks, benefits, and alternatives were discussed at length including but not limited to the risks of infection, bleeding, nerve injury, stiffness, blood clots, the need for revision surgery, cardiopulmonary complications, among others, and they were willing to proceed.  PROCEDURE IN DETAIL: The patient was brought into the operating room and, after adequate spinal  anesthesia was achieved, a tourniquet was placed on the patient's upper thigh. The patient's knee and leg were cleaned and prepped with alcohol and DuraPrep and draped in the usual sterile fashion. A "timeout" was performed as per usual protocol. The lower extremity was exsanguinated using an Esmarch, and the tourniquet was inflated to 300 mmHg. An anterior longitudinal incision was made followed by a standard mid vastus approach. The deep fibers of the medial collateral ligament were elevated in a subperiosteal fashion off of the medial flare of the tibia so as to maintain a continuous soft tissue sleeve. The patella was subluxed laterally and the patellofemoral ligament was incised. Inspection of the knee demonstrated severe degenerative changes with full-thickness loss of articular cartilage. Osteophytes were debrided using a rongeur. Anterior and posterior cruciate ligaments were excised. Two 4.0 mm Schanz pins were inserted in the femur and into the tibia for attachment of the array of trackers used for computer-assisted navigation. Hip center was identified using a circumduction technique. Distal landmarks were mapped using the computer. The distal femur and proximal tibia were mapped using the computer. The distal femoral cutting guide was positioned using computer-assisted navigation so as to achieve a 5 distal valgus cut. The femur was sized and it was felt that a size 5 femoral component was appropriate. A size 5 femoral cutting guide was positioned and the anterior cut was performed and verified using the computer. This was followed by completion of the posterior and chamfer cuts. Femoral cutting guide for the central box was then positioned in the center box cut was performed.  Attention was then directed to the proximal tibia. Medial and lateral menisci were excised. The extramedullary tibial cutting guide was positioned using computer-assisted navigation so as to achieve a 0 varus-valgus alignment  and 3 posterior slope. The cut was performed and verified  using the computer. The proximal tibia was sized and it was felt that a size 5 tibial tray was appropriate. Tibial and femoral trials were inserted followed by insertion of a 5 mm polyethylene insert. The knee was felt to be tight laterally. The trial components removed and the knee was brought into full extension and distracted using the Moreland retractors. The posterolateral corner was carefully released using combination of electrocautery and Metzenbaum scissors. Trial components were reinserted with a 5 mm polyethylene trial. This allowed for excellent mediolateral soft tissue balancing both in flexion and in full extension. Finally, the patella was cut and prepared so as to accommodate a 35 mm medialized dome patella. A patella trial was placed and the knee was placed through a range of motion with excellent patellar tracking appreciated. The femoral trial was removed after debridement of posterior osteophytes. The central post-hole for the tibial component was reamed followed by insertion of a keel punch. Tibial trials were then removed. Cut surfaces of bone were irrigated with copious amounts of normal saline with antibiotic solution using pulsatile lavage and then suctioned dry. Polymethylmethacrylate cement was prepared in the usual fashion using a vacuum mixer. Cement was applied to the cut surface of the proximal tibia as well as along the undersurface of a size 5 rotating platform tibial component. Tibial component was positioned and impacted into place. Excess cement was removed using [employer] fast streamer. Cement was then applied to the cut surfaces of the femur as well as along the posterior flanges of the size 5 femoral component. The femoral component was positioned and impacted into place. Excess cement was removed using [employer] fast streamer. A 5 mm polyethylene trial was inserted and the knee was brought into full extension with steady axial  compression applied. Finally, cement was applied to the backside of a 35 mm medialized dome patella and the patellar component was positioned and patellar clamp applied. Excess cement was removed using [employer] fast streamer. After adequate curing of the cement, the tourniquet was deflated after a total tourniquet time of 116 minutes. Hemostasis was achieved using electrocautery. The knee was irrigated with copious amounts of normal saline with antibiotic solution using pulsatile lavage and then suctioned dry. 20 mL of 1.3% Exparel and 60 mL of 0.25% Marcaine in 40 mL of normal saline was injected along the posterior capsule, medial and lateral gutters, and along the arthrotomy site. A 5 mm stabilized rotating platform polyethylene insert was inserted and the knee was placed through a range of motion with excellent mediolateral soft tissue balancing appreciated and excellent patellar tracking noted. 2 medium drains were placed in the wound bed and brought out through separate stab incisions to be attached to a reinfusion system. The medial parapatellar portion of the incision was reapproximated using interrupted sutures of #1 Vicryl. Subcutaneous tissue was approximated in layers using first #0 Vicryl followed #2-0 Vicryl. The skin was approximated with skin staples. A sterile dressing was applied.  The patient tolerated the procedure well and was transported to the recovery room in stable condition.    James P. Holley Bouche., M.D.

## 2015-12-03 NOTE — H&P (Signed)
The patient has been re-examined, and the chart reviewed, and there have been no interval changes to the documented history and physical.    The risks, benefits, and alternatives have been discussed at length. The patient expressed understanding of the risks benefits and agreed with plans for surgical intervention.  James P. Hooten, Jr. M.D.    

## 2015-12-03 NOTE — Brief Op Note (Signed)
12/03/2015  11:19 AM  PATIENT:  [name]  82 y.o. male  PRE-OPERATIVE DIAGNOSIS:  OSTEOARTHRITIS of the left knee  POST-OPERATIVE DIAGNOSIS:  Same  PROCEDURE:  Procedure(s): COMPUTER ASSISTED TOTAL KNEE ARTHROPLASTY (Left)  SURGEON:  Surgeon(s) and Role:    * Dereck Leep, MD - Primary  ASSISTANTS: Rachelle Hora, PA-C   ANESTHESIA:   spinal  EBL:  Total I/O In: -  Out: 650 [Urine:500; Blood:150]  BLOOD ADMINISTERED:none  DRAINS: 2 medium drains to a reinfusion system   LOCAL MEDICATIONS USED:  MARCAINE    and OTHER Exparel  SPECIMEN:  No Specimen  DISPOSITION OF SPECIMEN:  N/A  COUNTS:  YES  TOURNIQUET:   116 minutes  DICTATION: .Dragon Dictation  PLAN OF CARE: Admit to inpatient   PATIENT DISPOSITION:  PACU - hemodynamically stable.   Delay start of Pharmacological VTE agent (>24hrs) due to surgical blood loss or risk of bleeding: yes

## 2015-12-03 NOTE — Transfer of Care (Signed)
Immediate Anesthesia Transfer of Care Note  Patient: David Duncan  Procedure(s) Performed: Procedure(s): COMPUTER ASSISTED TOTAL KNEE ARTHROPLASTY (Left)  Patient Location: PACU  Anesthesia Type:Spinal  Level of Consciousness: awake and alert   Airway & Oxygen Therapy: Patient Spontanous Breathing and Patient connected to face mask oxygen  Post-op Assessment: Report given to RN and Post -op Vital signs reviewed and stable  Post vital signs: Reviewed and stable  Last Vitals:  Vitals:   12/03/15 0615 12/03/15 1119  BP: 118/80   Pulse: 93   Resp: 16   Temp: 36.5 C (P) 37.1 C    Last Pain:  Vitals:   12/03/15 0615  TempSrc: Oral         Complications: No apparent anesthesia complications

## 2015-12-03 NOTE — NC FL2 (Signed)
[place] LEVEL OF CARE SCREENING TOOL     IDENTIFICATION  Patient Name: David Duncan Birthdate: [birth year] Sex: male Admission Date (Current Location): 12/03/2015  [place] and [place] Number:  Engineering geologist and Address:  [facility], [address] [place], [name] [address]      Provider Number: [id number]  Attending Physician Name and Address:  Dereck Leep, MD  Relative Name and Phone Number:       Current Level of Care: Hospital Recommended Level of Care: [place] Prior Approval Number:    Date Approved/Denied:   PASRR Number:  ([id number])  Discharge Plan: SNF    Current Diagnoses: Patient Active Problem List   Diagnosis Date Noted  . S/P total knee arthroplasty 12/03/2015  . Routine general medical examination at a health care facility 08/08/2015  . Congestive cardiomyopathy ([place]) 03/18/2015  . Cerebrovascular accident (CVA) ([place]) 03/18/2015  . Spinal stenosis in cervical region 02/05/2015  . Cervical spinal stenosis 02/02/2015  . Left shoulder pain 12/06/2014  . Cardiomyopathy, dilated ([place]) 09/16/2014  . Dyslipidemia 09/16/2014  . Cerebral vascular accident ([place]) 09/16/2014  . Heart valve disease 09/16/2014  . Chest pain 08/08/2014  . MI (mitral incompetence) 08/08/2014  . TI (tricuspid incompetence) 08/08/2014  . AF (paroxysmal atrial fibrillation) ([place]) 02/15/2014  . Atrial fibrillation, chronic ([place]) 01/16/2014  . Chronic systolic heart failure ([place]) 01/16/2014  . Chronic atrial fibrillation ([place]) 01/16/2014  . Breathlessness on exertion 12/19/2013  . Bunion, right foot 08/17/2013  . Macular degeneration 10/16/2012  . Encounter for Medicare annual wellness exam 08/16/2012  . Neck pain 02/19/2011  . Hearing decreased 12/16/2010  . Medication adverse effect 07/28/2010  . Prostate cancer screening 07/28/2010  . Long term (current) use of anticoagulants 07/22/2010  .  HYPERLIPIDEMIA-MIXED 06/01/2010  . ATRIAL FIBRILLATION 11/28/2009  . ATRIAL FLUTTER 11/28/2009  . CVA 11/28/2009  . BIPOLAR AFFECTIVE DISORDER 02/20/2009    Orientation RESPIRATION BLADDER Height & Weight     Self, Time, Situation, Place  Normal Continent Weight: 160 lb (72.6 kg) Height:  5\' 6"  (167.6 cm)  BEHAVIORAL SYMPTOMS/MOOD NEUROLOGICAL BOWEL NUTRITION STATUS   (none)  (none) Continent Diet (Diet: Clear Liquid )  AMBULATORY STATUS COMMUNICATION OF NEEDS Skin   Extensive Assist Verbally Surgical wounds (Incision: Left Knee )                       Personal Care Assistance Level of Assistance  Bathing, Feeding, Dressing Bathing Assistance: Limited assistance Feeding assistance: Independent Dressing Assistance: Limited assistance     Functional Limitations Info  Sight, Hearing, Speech Sight Info: Impaired Hearing Info: Adequate Speech Info: Adequate    SPECIAL CARE FACTORS FREQUENCY  PT (By licensed PT), OT (By licensed OT)     PT Frequency:  (5) OT Frequency:  (5)            Contractures      Additional Factors Info  Code Status, Allergies, Psychotropic Code Status Info:  (Full Code. ) Allergies Info:  (No Known Allergies. ) Psychotropic Info:  (Lexapro)         Current Medications (12/03/2015):  This is the current hospital active medication list Current Facility-Administered Medications  Medication Dose Route Frequency Provider Last Rate Last Dose  . 0.9 %  sodium chloride infusion   Intravenous Continuous Dereck Leep, MD 100 mL/hr at 12/03/15 1156 1,000 mL at 12/03/15 1156  . acetaminophen (OFIRMEV) IV 1,000 mg  1,000 mg Intravenous Q6H Dereck Leep, MD   1,000 mg at 12/03/15 1315  . acetaminophen (TYLENOL) tablet 650 mg  650 mg Oral Q6H PRN Dereck Leep, MD       Or  . acetaminophen (TYLENOL) suppository 650 mg  650 mg Rectal Q6H PRN Dereck Leep, MD      . alum & mag hydroxide-simeth (MAALOX/MYLANTA) 200-200-20 MG/5ML suspension 30 mL   30 mL Oral Q4H PRN Dereck Leep, MD      . b complex vitamins tablet 1 tablet  1 tablet Oral BID Dereck Leep, MD      . bisacodyl (DULCOLAX) suppository 10 mg  10 mg Rectal Daily PRN Dereck Leep, MD      . buPROPion [facility]) tablet 75 mg  75 mg Oral q morning - 10a Dereck Leep, MD      . ceFAZolin (ANCEF) 2-4 GM/100ML-% IVPB           . ceFAZolin (ANCEF) IVPB 2g/100 mL premix  2 g Intravenous Q6H Dereck Leep, MD   2 g at 12/03/15 1337  . [START ON 12/04/2015] cholecalciferol (VITAMIN D) tablet   Oral Daily Dereck Leep, MD      . diphenhydrAMINE (BENADRYL) 12.5 MG/5ML elixir 12.5-25 mg  12.5-25 mg Oral Q4H PRN Dereck Leep, MD      . Derrill Memo ON 12/04/2015] escitalopram (LEXAPRO) tablet 10 mg  10 mg Oral BH-q7a Dereck Leep, MD      . ferrous sulfate tablet 325 mg  325 mg Oral BID WC Dereck Leep, MD      . magnesium hydroxide (MILK OF MAGNESIA) suspension 30 mL  30 mL Oral Daily PRN Dereck Leep, MD      . menthol-cetylpyridinium (CEPACOL) lozenge 3 mg  1 lozenge Oral PRN Dereck Leep, MD       Or  . phenol (CHLORASEPTIC) mouth spray 1 spray  1 spray Mouth/Throat PRN Dereck Leep, MD      . metoCLOPramide (REGLAN) tablet 10 mg  10 mg Oral TID AC & HS Dereck Leep, MD      . metoprolol tartrate (LOPRESSOR) tablet 25 mg  25 mg Oral BID Dereck Leep, MD      . morphine 2 MG/ML injection 2 mg  2 mg Intravenous Q2H PRN Dereck Leep, MD      . ondansetron (ZOFRAN) tablet 4 mg  4 mg Oral Q6H PRN Dereck Leep, MD       Or  . ondansetron (ZOFRAN) injection 4 mg  4 mg Intravenous Q6H PRN Dereck Leep, MD      . oxyCODONE (Oxy IR/ROXICODONE) immediate release tablet 5-10 mg  5-10 mg Oral Q4H PRN Dereck Leep, MD   10 mg at 12/03/15 1331  . pantoprazole (PROTONIX) EC tablet 40 mg  40 mg Oral BID Dereck Leep, MD      . potassium chloride (K-DUR,KLOR-CON) CR tablet 10 mEq  10 mEq Oral Daily Dereck Leep, MD      . pyridOXINE (VITAMIN B-6) tablet 25 mg  25 mg  Oral BID Dereck Leep, MD      . senna-docusate (Senokot-S) tablet 1 tablet  1 tablet Oral BID Dereck Leep, MD      . sodium chloride flush 0.9 % injection           . sodium phosphate (FLEET) 7-19 GM/118ML enema 1 enema  1  enema Rectal Once PRN Dereck Leep, MD      . Thyroid TABS 1 tablet  1 tablet Oral Daily Dereck Leep, MD      . traMADol Veatrice Bourbon) tablet 50-100 mg  50-100 mg Oral Q4H PRN Dereck Leep, MD      . Vitamin B-12 SUBL 5,000 mcg  5,000 mcg Sublingual Daily Dereck Leep, MD      . vitamin C (ASCORBIC ACID) tablet 500 mg  500 mg Oral BID Dereck Leep, MD      . warfarin (COUMADIN) tablet 2.5 mg  2.5 mg Oral q1800 Dereck Leep, MD      . Warfarin - Physician Dosing Inpatient   Does not apply [id number] Dereck Leep, MD         Discharge Medications: Please see discharge summary for a list of discharge medications.  Relevant Imaging Results:  Relevant Lab Results:   Additional Information  (SSN: [id number])  Sample, Veronia Beets, LCSW

## 2015-12-04 ENCOUNTER — Encounter: Payer: Self-pay | Admitting: Orthopedic Surgery

## 2015-12-04 LAB — BASIC METABOLIC PANEL
ANION GAP: 6 (ref 5–15)
BUN: 13 mg/dL (ref 6–20)
CALCIUM: 8.5 mg/dL — AB (ref 8.9–10.3)
CO2: 27 mmol/L (ref 22–32)
Chloride: 106 mmol/L (ref 101–111)
Creatinine, Ser: 0.92 mg/dL (ref 0.61–1.24)
GLUCOSE: 134 mg/dL — AB (ref 65–99)
POTASSIUM: 4.3 mmol/L (ref 3.5–5.1)
Sodium: 139 mmol/L (ref 135–145)

## 2015-12-04 LAB — CBC
HCT: 39.9 % — ABNORMAL LOW (ref 40.0–52.0)
Hemoglobin: 13.7 g/dL (ref 13.0–18.0)
MCH: 34 pg (ref 26.0–34.0)
MCHC: 34.3 g/dL (ref 32.0–36.0)
MCV: 99.1 fL (ref 80.0–100.0)
PLATELETS: 130 10*3/uL — AB (ref 150–440)
RBC: 4.02 MIL/uL — AB (ref 4.40–5.90)
RDW: 15.1 % — ABNORMAL HIGH (ref 11.5–14.5)
WBC: 8.5 10*3/uL (ref 3.8–10.6)

## 2015-12-04 LAB — PROTIME-INR
INR: 1.28
PROTHROMBIN TIME: 16.1 s — AB (ref 11.4–15.2)

## 2015-12-04 NOTE — Progress Notes (Signed)
Clinical Education officer, museum (CSW) presented bed offers to patient and his wife. Per wife she will review offers and get back to CSW. CSW will continue to follow and assist as needed.   [employer], LCSW [phone]

## 2015-12-04 NOTE — Evaluation (Signed)
Occupational Therapy Evaluation Patient Details Name: David Duncan MRN: [id number] DOB: 1930-06-04 Today's Date: 12/04/2015    History of Present Illness Pt is a 82 yr old male s/p elective L TKA 8/23. PMH significant for AFib, basal cell CA, CHF, anxiety, bipolar disorder, L shoulder bursitis, and h/o R sided CVA 2011.   Clinical Impression   Pt is 82 year old male s/p L TKA.  Pt was independent in all ADLs prior to surgery with occasional assist from his wife for donning shoes and is eager to return to PLOF.  Pt currently requires moderate assist for LB dressing while in seated position due to pain and limited AROM of L knee.  Pt would benefit from instruction in dressing techniques with or without assistive devices for dressing and bathing skills.  Pt would also benefit from recommendations for home modifications to increase safety in the bathroom and prevent falls. Pt needs extra time to complete tasks and would benefit from SNF for continued rehab after discharge.    Follow Up Recommendations  SNF    Equipment Recommendations   (pt could benefit from [facility] over toilet to help increase safety)    Recommendations for Other Services       Precautions / Restrictions Precautions Precautions: Knee;Fall Restrictions Weight Bearing Restrictions: Yes LLE Weight Bearing: Weight bearing as tolerated      Mobility Bed Mobility                  Transfers                      Balance                                            ADL Overall ADL's : Needs assistance/impaired Eating/Feeding: Independent;Set up   Grooming: Wash/dry hands;Wash/dry face;Oral care;Applying deodorant;Brushing hair;Independent;Set up   Upper Body Bathing: Independent;Set up   Lower Body Bathing: Moderate assistance;Set up   Upper Body Dressing : Independent;Set up;Cueing for safety;Cueing for sequencing   Lower Body Dressing: Moderate assistance;Cueing for  safety Lower Body Dressing Details (indicate cue type and reason): Pt requries mod assist for completing L LE dressing and needs extra time and cues for safety sitting EOB but was tired from not sleeping last night                     Vision     Perception     Praxis      Pertinent Vitals/Pain Pain Assessment: 0-10 Pain Score: 5  Pain Location: L knee Pain Descriptors / Indicators: Aching Pain Intervention(s): Limited activity within patient's tolerance;Monitored during session;Premedicated before session;Repositioned     Hand Dominance Right   Extremity/Trunk Assessment Upper Extremity Assessment Upper Extremity Assessment: Generalized weakness (Hx of CVA in 2011 with R side weakness and L shoulder busitis but functional use of LUE but not full range)   Lower Extremity Assessment Lower Extremity Assessment: Defer to PT evaluation   Cervical / Trunk Assessment Cervical / Trunk Assessment: Normal   Communication Communication Communication: No difficulties (mild delay in responses but could be due to being sleepy)   Cognition Arousal/Alertness: Awake/alert (sleepy off and on due to lack of sleepy last night but able to attend for whole session) Behavior During Therapy: [facility] for tasks assessed/performed Overall Cognitive Status: Within Functional Limits for tasks assessed  General Comments       Exercises       Shoulder Instructions      Home Living Family/patient expects to be discharged to:: Private residence Living Arrangements: Spouse/significant other Available Help at Discharge: Family;Friend(s) Type of Home: House Home Access: Stairs to enter [employer] of Steps: 1 Entrance Stairs-Rails: None Home Layout: One level     Bathroom Shower/Tub: Walk-in shower (has a shower stall he mainly uses with door and also has a tub with shower)   Bathroom Toilet: Standard     Home Equipment: Walker - 2 wheels           Prior Functioning/Environment Level of Independence: Independent        Comments: Occasional unilateral hand held assist to arise from a deep chair but otherwise independent.  He was independent in ADLs with occasional assist from his wife for shoes when knee pain was bad.    OT Diagnosis: Generalized weakness;Acute pain   OT Problem List: Decreased strength;Decreased range of motion;Decreased activity tolerance;Decreased safety awareness;Pain;Decreased knowledge of use of DME or AE   OT Treatment/Interventions: Self-care/ADL training;DME and/or AE instruction;Patient/family education;Therapeutic activities    OT Goals(Current goals can be found in the care plan section) Acute Rehab OT Goals Patient Stated Goal: To return to PLOF OT Goal Formulation: With patient Time For Goal Achievement: 12/18/15 Potential to Achieve Goals: Good ADL Goals Pt Will Perform Lower Body Dressing: with min assist;with adaptive equipment;sit to/from stand (using reacher and sock aid in sitting) Pt Will Transfer to Toilet: bedside commode;stand pivot transfer;with mod assist (BSC over toilet with RW and no LOB)  OT Frequency: Min 1X/week   Barriers to D/C:            Co-evaluation              End of Session    Activity Tolerance: Patient limited by fatigue;Patient limited by pain Patient left: in bed;with call bell/phone within reach;with bed alarm set   Time: [id number] OT Time Calculation (min): 29 min Charges:  OT General Charges $OT Visit: 1 Procedure OT Evaluation $OT Eval Moderate Complexity: 1 Procedure OT Treatments $Self Care/Home Management : 8-22 mins G-Codes:     Chrys Racer, OTR/L ascom [phone] 12/04/15, 9:53 AM

## 2015-12-04 NOTE — Progress Notes (Signed)
Pt transferred from chair to bed, needed 2 assist with RW. Occasional loss of balance with transfer, stand/pivot with lots of cueing. Pain reported well controlled with oxy/tramadol alternated every 2-3 hours. Good appetite. Wife at bedside. PT recommending SNF at this time. Will continue to monitor.

## 2015-12-04 NOTE — Clinical Social Work Placement (Signed)
   CLINICAL SOCIAL WORK PLACEMENT  NOTE  Date:  12/04/2015  Patient Details  Name: David Duncan MRN: [id number] Date of Birth: [birth year]  Clinical Social Work is seeking post-discharge placement for this patient at the [place] level of care (*CSW will initial, date and re-position this form in  chart as items are completed):  Yes   Patient/family provided with [place] Work Department's list of facilities offering this level of care within the geographic area requested by the patient (or if unable, by the patient's family).  Yes   Patient/family informed of their freedom to choose among providers that offer the needed level of care, that participate in Medicare, Medicaid or managed care program needed by the patient, have an available bed and are willing to accept the patient.  Yes   Patient/family informed of [place]'s ownership interest in [facility] and [facility], as well as of the fact that they are under no obligation to receive care at these facilities.  PASRR submitted to EDS on 12/03/15     PASRR number received on 12/03/15     Existing PASRR number confirmed on       FL2 transmitted to all facilities in geographic area requested by pt/family on 12/04/15     FL2 transmitted to all facilities within larger geographic area on       Patient informed that his/her managed care company has contracts with or will negotiate with certain facilities, including the following:            Patient/family informed of bed offers received.  Patient chooses bed at       Physician recommends and patient chooses bed at      Patient to be transferred to   on  .  Patient to be transferred to facility by       Patient family notified on   of transfer.  Name of family member notified:        PHYSICIAN       Additional Comment:    _______________________________________________ Lakira Ogando, Veronia Beets, LCSW 12/04/2015, 10:18 AM

## 2015-12-04 NOTE — Anesthesia Postprocedure Evaluation (Signed)
Anesthesia Post Note  Patient: ACKLEY BRIXIUS  Procedure(s) Performed: Procedure(s) (LRB): COMPUTER ASSISTED TOTAL KNEE ARTHROPLASTY (Left)  Patient location during evaluation: Nursing Unit Anesthesia Type: Spinal Level of consciousness: awake, awake and alert and oriented Pain management: pain level controlled Vital Signs Assessment: post-procedure vital signs reviewed and stable Respiratory status: spontaneous breathing, nonlabored ventilation and respiratory function stable Cardiovascular status: blood pressure returned to baseline and stable Postop Assessment: no headache Anesthetic complications: no    Last Vitals:  Vitals:   12/04/15 0434 12/04/15 0507  BP: (!) 140/95 130/74  Pulse: (!) 110 86  Resp: 18   Temp: 36.5 C     Last Pain:  Vitals:   12/04/15 0500  TempSrc:   PainSc: 0-No pain                 Eros Montour,  Zauria Dombek R

## 2015-12-04 NOTE — Progress Notes (Signed)
Subjective: 1 Day Post-Op Procedure(s) (LRB): COMPUTER ASSISTED TOTAL KNEE ARTHROPLASTY (Left) Patient reports pain as mild.   Patient is well, and has had no acute complaints or problems Plan is to go Skilled nursing facility after hospital stay. Negative for chest pain and shortness of breath Fever: no Gastrointestinal:negative for nausea and vomiting, order for prn zofran  Objective: Vital signs in last 24 hours: Temp:  [97.6 F (36.4 C)-98.8 F (37.1 C)] 97.7 F (36.5 C) (08/24 0434) Pulse Rate:  [66-110] 86 (08/24 0507) Resp:  [12-19] 18 (08/24 0434) BP: (107-140)/(66-95) 130/74 (08/24 0507) SpO2:  [92 %-98 %] 92 % (08/24 0434)  Intake/Output from previous day:  Intake/Output Summary (Last 24 hours) at 12/04/15 0718 Last data filed at 12/04/15 0600  Gross per 24 hour  Intake          4176.67 ml  Output             4195 ml  Net           -18.33 ml    Intake/Output this shift: No intake/output data recorded.  Labs:  Recent Labs  12/04/15 0503  HGB 13.7    Recent Labs  12/04/15 0503  WBC 8.5  RBC 4.02*  HCT 39.9*  PLT 130*    Recent Labs  12/04/15 0503  NA 139  K 4.3  CL 106  CO2 27  BUN 13  CREATININE 0.92  GLUCOSE 134*  CALCIUM 8.5*    Recent Labs  12/03/15 0626 12/04/15 0503  INR 1.14 1.28     EXAM General - Patient is Alert, Appropriate and Oriented Extremity - ABD soft Neurovascular intact Incision: dressing C/D/I Dressing/Incision - clean, dry, no drainage Motor Function - intact, moving foot and toes well on exam.   Hemovac in place, with bloody drainage Pt in chair with rolled-towels beneath heals. Pt able to perform straight leg raise with no assistance.  Past Medical History:  Diagnosis Date  . Anxiety   . Atrial fibrillation ([place])   . Basal cell cancer 2008   on shoulder  . Bipolar disorder ([place])    hospitalized in past  . CVA (cerebral vascular accident) ([place]) 8/11  . Generalized anxiety disorder   . GERD  (gastroesophageal reflux disease)   . History of kidney stones    30 years ago  . Macular degeneration   . Osteoarthritis    in neck and left knee  . Tremor    noted when writing   Assessment/Plan: 1 Day Post-Op Procedure(s) (LRB): COMPUTER ASSISTED TOTAL KNEE ARTHROPLASTY (Left) Active Problems:   S/P total knee arthroplasty  Estimated body mass index is 25.82 kg/m as calculated from the following:   Height as of this encounter: 5\' 6"  (1.676 m).   Weight as of this encounter: 72.6 kg (160 lb). Advance diet Up with therapy D/C IV fluids  When tolerating a po diet.  Labs reviewed, Platelets 130. CBC and BMP ordered for tomorrow morning. Will removed bulky dressing and hemovac tomorrow morning. Up with PT today, currently recommending SNF. Care management to assist with discharge.  DVT Prophylaxis - Coumadin, Foot Pumps and TED hose Weight-Bearing as tolerated to left leg  J. Cameron Proud, PA-C [facility] Orthopaedic Surgery 12/04/2015, 7:18 AM

## 2015-12-04 NOTE — Progress Notes (Signed)
Physical Therapy Treatment Patient Details Name: David Duncan MRN: [id number] DOB: July 18, 1930 Today's Date: 12/04/2015    History of Present Illness Pt is a 82 yr old male s/p elective L TKA 8/23. PMH significant for AFib, basal cell CA, CHF, anxiety, bipolar disorder, L shoulder bursitis, and h/o R sided CVA 2011.    PT Comments    Pt agreeable to PT; notes pain in left knee continues 3/10, but worried movement will increase. Education/discussion on benefits of exercise/movement in moderation. Pt has difficulty with balance in both sit and stand demonstrating retropulsion. Pt requires increased time, cues and assist to attain/maintain balance. Pt up in chair comfortably and encouraged to continue with isometric strengthening of lower extremities as well as stretching. Continue PT to progress range, strength, endurance, and balance to improve functional mobility.  Follow Up Recommendations  SNF     Equipment Recommendations       Recommendations for Other Services       Precautions / Restrictions Precautions Precautions: Knee;Fall Restrictions Weight Bearing Restrictions: Yes LLE Weight Bearing: Weight bearing as tolerated    Mobility  Bed Mobility Overal bed mobility: Needs Assistance Bed Mobility: Supine to Sit     Supine to sit: Mod assist     General bed mobility comments: Difficulty elevating trunk and maintaining balance initially; Min A for LEs  Transfers Overall transfer level: Needs assistance Equipment used: Rolling walker (2 wheeled) Transfers: Sit to/from Stand Sit to Stand: Mod assist         General transfer comment: Difficulty finding balance in stand, cues for hand placement. Retropulsive tendencies  Ambulation/Gait Ambulation/Gait assistance: Min assist Ambulation Distance (Feet): 5 Feet Assistive device: Rolling walker (2 wheeled) Gait Pattern/deviations: Step-to pattern Gait velocity: slow Gait velocity interpretation: <1.8 ft/sec,  indicative of risk for recurrent falls General Gait Details: Requires slow 3 point sequence with time to maintain balance in between steps with Min A   Stairs            Wheelchair Mobility    Modified Rankin (Stroke Patients Only)       Balance Overall balance assessment: Needs assistance Sitting-balance support: Feet supported;Bilateral upper extremity supported Sitting balance-Leahy Scale: Fair (initially poor; improves with time/work to fair) Sitting balance - Comments: Retropulsive initially    Standing balance support: Bilateral upper extremity supported Standing balance-Leahy Scale: Poor Standing balance comment: Min A and increased time to attain/maintain balance                    Cognition Arousal/Alertness: Awake/alert Behavior During Therapy: WFL for tasks assessed/performed Overall Cognitive Status: Within Functional Limits for tasks assessed                      Exercises Total Joint Exercises Ankle Circles/Pumps: AROM;Both;20 reps Quad Sets: Strengthening;Both;20 reps Gluteal Sets: Strengthening;Both;20 reps Heel Slides: Other (comment) (attempted; pt not tolerating at this time) Hip ABduction/ADduction: AAROM;Left;20 reps;Supine Straight Leg Raises: AAROM;Left;10 reps;Supine (limited range) Long Arc Quad: AAROM;Left;10 reps;Seated Knee Flexion: AAROM;Left;10 reps;Seated (3 positions each rep with 10 sec hold each position) Goniometric ROM: 0-82    General Comments        Pertinent Vitals/Pain Pain Assessment: 0-10 Pain Score: 3  Pain Location: L knee Pain Descriptors / Indicators: Aching;Burning Pain Intervention(s): Premedicated before session;Repositioned;Limited activity within patient's tolerance    Home Living                      Prior  Function            PT Goals (current goals can now be found in the care plan section) Progress towards PT goals: Progressing toward goals    Frequency  BID    PT Plan  Current plan remains appropriate    Co-evaluation             End of Session Equipment Utilized During Treatment: Gait belt Activity Tolerance: Patient limited by fatigue;Other (comment) (weakness, balance deficits) Patient left: in chair;with call bell/phone within reach;with chair alarm set;with family/visitor present     Time: 1340-1416 PT Time Calculation (min) (ACUTE ONLY): 36 min  Charges:  $Gait Training: 8-22 mins $Therapeutic Exercise: 8-22 mins                    G Codes:      Charlaine Dalton, PTA 12/04/2015, 2:58 PM

## 2015-12-04 NOTE — Clinical Social Work Note (Signed)
Clinical Social Work Assessment  Patient Details  Name: David Duncan MRN: [id number] Date of Birth: [birth year]  Date of referral:  12/04/15               Reason for consult:  Facility Placement                Permission sought to share information with:  Chartered certified accountant granted to share information::  Yes, Verbal Permission Granted  Name::      David Duncan::   David Duncan   Relationship::     Contact Information:     Housing/Transportation Living arrangements for the past 2 months:  [place] of Information:  Patient Patient Interpreter Needed:  None Criminal Activity/Legal Involvement Pertinent to Current Situation/Hospitalization:  No - Comment as needed Significant Relationships:  Adult Children, Spouse Lives with:  Spouse Do you feel safe going back to the place where you live?  Yes Need for family participation in patient care:  Yes (Comment)  Care giving concerns:  Patient lives in [place] with his wife David Duncan.    Social Worker assessment / plan:  Holiday representative (CSW) received SNF consult. PT is recommending SNF. CSW and social work Theatre manager met with patient at bedside. Patient was alone and no family was at bedside. CSW introduced self and explained role of CSW department. Patient was alert and oriented and was laying in the bed. Patient reported that he lives in [place] with his wife. Per patient he has 4 adult daughters, 3 live in [place] and 1 lives in [place]. CSW explained that PT is recommending SNF and that patient's insurance Health Team requires authorization. Patient is agreeable to SNF search in [facility] and does not a preference at this time. Patient gave CSW permission to call his wife. SNF list was provided.   FL2 complete and faxed out. CSW left a voicemail from patient's wife David Duncan. CSW will continue to follow and assist as needed.   Employment status:  Retired Designer, industrial/product PT Recommendations:  [place] / Referral to community resources:  [place]  Patient/Family's Response to care:  Patient is agreeable to [facility] in [place].   Patient/Family's Understanding of and Emotional Response to Diagnosis, Current Treatment, and Prognosis:  Patient was very pleasant and thanked CSW for visit.   Emotional Assessment Appearance:  Appears stated age Attitude/Demeanor/Rapport:    Affect (typically observed):  Accepting, Adaptable, Pleasant Orientation:  Oriented to Self, Oriented to Place, Oriented to  Time, Oriented to Situation Alcohol / Substance use:  Not Applicable Psych involvement (Current and /or in the community):  No (Comment)  Discharge Needs  Concerns to be addressed:  Discharge Planning Concerns Readmission within the last 30 days:  No Current discharge risk:  Dependent with Mobility Barriers to Discharge:  Continued Medical Work up   [employer], Veronia Beets, LCSW 12/04/2015, 10:19 AM

## 2015-12-04 NOTE — Progress Notes (Signed)
Physical Therapy Treatment Patient Details Name: David Duncan MRN: [id number] DOB: May 05, 1930 Today's Date: 12/04/2015    History of Present Illness Pt is a 82 yr old male s/p elective L TKA 8/23. PMH significant for AFib, basal cell CA, CHF, anxiety, bipolar disorder, L shoulder bursitis, and h/o R sided CVA 2011.    PT Comments    Treatment attempted earlier this morning; pt having significant pain after getting up to chair and wished to defer until later. Second attempt, pt in bed and notes pain has decreased to 3/10, but increases with attempted movement. Pt agreeable to short session of exercise with plan to attempt out of bed, ambulation and left knee flexion post lunch. Pt participates in limited exercises requiring assist to move Left lower extremity. Pt demonstrates active quad set and left knee at 0 degrees; extension neutral. Continue PT to progress range, strength and tolerance of out of bed activities to improve functional mobility.   Follow Up Recommendations  SNF     Equipment Recommendations       Recommendations for Other Services       Precautions / Restrictions Precautions Precautions: Knee;Fall Restrictions Weight Bearing Restrictions: Yes LLE Weight Bearing: Weight bearing as tolerated    Mobility  Bed Mobility               General bed mobility comments: Not tested; pt up earlier with episode of increased pain and was returned to bed by nursing staff. Will attempt p.m session  Transfers                    Ambulation/Gait                 Stairs            Wheelchair Mobility    Modified Rankin (Stroke Patients Only)       Balance                                    Cognition Arousal/Alertness: Awake/alert Behavior During Therapy: WFL for tasks assessed/performed Overall Cognitive Status: Within Functional Limits for tasks assessed                      Exercises Total Joint  Exercises Ankle Circles/Pumps: AROM;Both;20 reps;Supine Quad Sets: Strengthening;Both;20 reps;Supine Gluteal Sets: Strengthening;Both;20 reps;Supine Heel Slides: Other (comment) (attempted; pt not tolerating at this time) Hip ABduction/ADduction: AAROM;Left;20 reps;Supine Straight Leg Raises: AAROM;Left;10 reps;Supine (limited range)    General Comments        Pertinent Vitals/Pain Pain Assessment: 0-10 Pain Score: 3  (earlier an 8) Pain Location: L Knee Pain Descriptors / Indicators: Aching;Constant Pain Intervention(s): Premedicated before session;Limited activity within patient's tolerance;Ice applied    Home Living Family/patient expects to be discharged to:: Private residence Living Arrangements: Spouse/significant other Available Help at Discharge: Family;Friend(s) Type of Home: House Home Access: Stairs to enter Entrance Stairs-Rails: None Home Layout: One level Home Equipment: Environmental consultant - 2 wheels      Prior Function Level of Independence: Independent      Comments: Occasional unilateral hand held assist to arise from a deep chair but otherwise independent.  He was independent in ADLs with occasional assist from his wife for shoes when knee pain was bad.   PT Goals (current goals can now be found in the care plan section) Acute Rehab PT Goals Patient Stated Goal: To return to  [facility] Progress towards PT goals: PT to reassess next treatment    Frequency  BID    PT Plan Current plan remains appropriate    Co-evaluation             End of Session   Activity Tolerance: Patient limited by pain Patient left: in bed;with call bell/phone within reach;with bed alarm set;with family/visitor present     Time: 1202-1215 PT Time Calculation (min) (ACUTE ONLY): 13 min  Charges:  $Therapeutic Exercise: 8-22 mins                    G Codes:      Charlaine Dalton, PTA 12/04/2015, 12:15 PM

## 2015-12-05 ENCOUNTER — Encounter
Admission: RE | Admit: 2015-12-05 | Discharge: 2015-12-05 | Disposition: A | Discharge status: Home / Self Care | Payer: PPO | Source: Ambulatory Visit | Attending: Internal Medicine | Admitting: Internal Medicine

## 2015-12-05 DIAGNOSIS — Z5181 Encounter for therapeutic drug level monitoring: Secondary | ICD-10-CM | POA: Diagnosis not present

## 2015-12-05 DIAGNOSIS — I5022 Chronic systolic (congestive) heart failure: Secondary | ICD-10-CM | POA: Diagnosis not present

## 2015-12-05 DIAGNOSIS — I480 Paroxysmal atrial fibrillation: Secondary | ICD-10-CM | POA: Diagnosis not present

## 2015-12-05 DIAGNOSIS — R41841 Cognitive communication deficit: Secondary | ICD-10-CM | POA: Diagnosis not present

## 2015-12-05 DIAGNOSIS — H3530 Unspecified macular degeneration: Secondary | ICD-10-CM | POA: Diagnosis not present

## 2015-12-05 DIAGNOSIS — K219 Gastro-esophageal reflux disease without esophagitis: Secondary | ICD-10-CM | POA: Diagnosis not present

## 2015-12-05 DIAGNOSIS — F319 Bipolar disorder, unspecified: Secondary | ICD-10-CM | POA: Diagnosis not present

## 2015-12-05 DIAGNOSIS — Z471 Aftercare following joint replacement surgery: Secondary | ICD-10-CM | POA: Diagnosis not present

## 2015-12-05 DIAGNOSIS — E785 Hyperlipidemia, unspecified: Secondary | ICD-10-CM | POA: Diagnosis not present

## 2015-12-05 DIAGNOSIS — R262 Difficulty in walking, not elsewhere classified: Secondary | ICD-10-CM | POA: Diagnosis not present

## 2015-12-05 DIAGNOSIS — F411 Generalized anxiety disorder: Secondary | ICD-10-CM | POA: Diagnosis not present

## 2015-12-05 DIAGNOSIS — Z96652 Presence of left artificial knee joint: Secondary | ICD-10-CM | POA: Diagnosis not present

## 2015-12-05 DIAGNOSIS — Z8673 Personal history of transient ischemic attack (TIA), and cerebral infarction without residual deficits: Secondary | ICD-10-CM | POA: Diagnosis not present

## 2015-12-05 DIAGNOSIS — Z7901 Long term (current) use of anticoagulants: Secondary | ICD-10-CM | POA: Diagnosis not present

## 2015-12-05 DIAGNOSIS — M6281 Muscle weakness (generalized): Secondary | ICD-10-CM | POA: Diagnosis not present

## 2015-12-05 DIAGNOSIS — M4802 Spinal stenosis, cervical region: Secondary | ICD-10-CM | POA: Diagnosis not present

## 2015-12-05 LAB — CBC
HEMATOCRIT: 35.1 % — AB (ref 40.0–52.0)
Hemoglobin: 12.4 g/dL — ABNORMAL LOW (ref 13.0–18.0)
MCH: 34.4 pg — AB (ref 26.0–34.0)
MCHC: 35.3 g/dL (ref 32.0–36.0)
MCV: 97.3 fL (ref 80.0–100.0)
PLATELETS: 117 10*3/uL — AB (ref 150–440)
RBC: 3.61 MIL/uL — AB (ref 4.40–5.90)
RDW: 14.9 % — ABNORMAL HIGH (ref 11.5–14.5)
WBC: 9.9 10*3/uL (ref 3.8–10.6)

## 2015-12-05 LAB — PROTIME-INR
INR: 1.75
Prothrombin Time: 20.7 seconds — ABNORMAL HIGH (ref 11.4–15.2)

## 2015-12-05 LAB — BASIC METABOLIC PANEL
ANION GAP: 5 (ref 5–15)
BUN: 14 mg/dL (ref 6–20)
CHLORIDE: 102 mmol/L (ref 101–111)
CO2: 27 mmol/L (ref 22–32)
Calcium: 8.4 mg/dL — ABNORMAL LOW (ref 8.9–10.3)
Creatinine, Ser: 0.95 mg/dL (ref 0.61–1.24)
GFR calc Af Amer: >60 mL/min (ref >60)
GLUCOSE: 121 mg/dL — AB (ref 65–99)
POTASSIUM: 4.5 mmol/L (ref 3.5–5.1)
Sodium: 134 mmol/L — ABNORMAL LOW (ref 135–145)

## 2015-12-05 MED ORDER — TRAMADOL HCL 50 MG PO TABS: 50.0000 mg | ORAL_TABLET | Freq: Four times a day (QID) | ORAL | 0 refills | Status: DC | PRN

## 2015-12-05 MED ORDER — OXYCODONE HCL 5 MG PO TABS: 5.0000 mg | ORAL_TABLET | ORAL | 0 refills | Status: DC | PRN

## 2015-12-05 MED ORDER — VITAMIN B-12 1000 MCG PO TABS
5000.0000 ug | ORAL_TABLET | Freq: Every day | ORAL | Status: DC
  Administered 2015-12-05: 5000 ug via ORAL
  Filled 2015-12-05: qty 5

## 2015-12-05 NOTE — Discharge Instructions (Signed)

## 2015-12-05 NOTE — Clinical Social Work Placement (Signed)
   CLINICAL SOCIAL WORK PLACEMENT  NOTE  Date:  12/05/2015  Patient Details  Name: David Duncan MRN: [id number] Date of Birth: 1931/03/26  Clinical Social Work is seeking post-discharge placement for this patient at the [place] level of care (*CSW will initial, date and re-position this form in  chart as items are completed):  Yes   Patient/family provided with [place] Work Department's list of facilities offering this level of care within the geographic area requested by the patient (or if unable, by the patient's family).  Yes   Patient/family informed of their freedom to choose among providers that offer the needed level of care, that participate in Medicare, Medicaid or managed care program needed by the patient, have an available bed and are willing to accept the patient.  Yes   Patient/family informed of [facility]'s ownership interest in [facility] and [facility], as well as of the fact that they are under no obligation to receive care at these facilities.  PASRR submitted to EDS on 12/03/15     PASRR number received on 12/03/15     Existing PASRR number confirmed on       FL2 transmitted to all facilities in geographic area requested by pt/family on 12/04/15     FL2 transmitted to all facilities within larger geographic area on       Patient informed that his/her managed care company has contracts with or will negotiate with certain facilities, including the following:        Yes   Patient/family informed of bed offers received.  Patient chooses bed at  [facility] )     Physician recommends and patient chooses bed at      Patient to be transferred to  [facility] ) on 12/05/15.  Patient to be transferred to facility by  [facility] )     Patient family notified on 12/05/15 of transfer.  Name of family member notified:   (Patient's wife [name] is aware of D/C today. )     PHYSICIAN        Additional Comment:    _______________________________________________ Yeng Frankie, Veronia Beets, LCSW 12/05/2015, 1:40 PM

## 2015-12-05 NOTE — Progress Notes (Signed)
Patient is medically stable for D/C to [facility] today. Per Kim admissions coordinator at [facility] patient will go to room 223. RN will call report at [phone] and arrange EMS for transport. Health Team authorization has been received. Auth # [id number]. Clinical Education officer, museum (CSW) sent D/C orders to [employer] via [employer]. Patient is aware of above. CSW contacted patient's wife [name] and made her aware of above. Please reconsult if future social work needs arise. CSW signing off.   [employer], LCSW [phone]

## 2015-12-05 NOTE — Progress Notes (Signed)
Subjective: 2 Days Post-Op Procedure(s) (LRB): COMPUTER ASSISTED TOTAL KNEE ARTHROPLASTY (Left) Patient reports pain as mild.   Patient is well, and has had no acute complaints or problems Plan is to go Skilled nursing facility after hospital stay. Negative for chest pain and shortness of breath Fever: no Gastrointestinal:negative for nausea and vomiting, order for prn zofran  Objective: Vital signs in last 24 hours: Temp:  [97.8 F (36.6 C)-99.2 F (37.3 C)] 99.2 F (37.3 C) (08/25 0738) Pulse Rate:  [98-118] 106 (08/25 0738) Resp:  [18-19] 18 (08/25 0738) BP: (111-138)/(70-89) 132/70 (08/25 0738) SpO2:  [96 %-98 %] 96 % (08/25 0738)  Intake/Output from previous day:  Intake/Output Summary (Last 24 hours) at 12/05/15 0750 Last data filed at 12/05/15 0300  Gross per 24 hour  Intake              360 ml  Output              960 ml  Net             -600 ml    Intake/Output this shift: No intake/output data recorded.  Labs:  Recent Labs  12/04/15 0503 12/05/15 0450  HGB 13.7 12.4*    Recent Labs  12/04/15 0503 12/05/15 0450  WBC 8.5 9.9  RBC 4.02* 3.61*  HCT 39.9* 35.1*  PLT 130* 117*    Recent Labs  12/04/15 0503 12/05/15 0450  NA 139 134*  K 4.3 4.5  CL 106 102  CO2 27 27  BUN 13 14  CREATININE 0.92 0.95  GLUCOSE 134* 121*  CALCIUM 8.5* 8.4*    Recent Labs  12/04/15 0503 12/05/15 0450  INR 1.28 1.75     EXAM General - Patient is Alert, Appropriate and Oriented Extremity - ABD soft Neurovascular intact Incision: dressing C/D/I Dressing/Incision - clean, dry, no drainage Motor Function - intact, moving foot and toes well on exam.   Hemovac and Bulky dressing removed today.  [place] underneath dry with no drainage. Pt in bed with rolled-towels beneath bilateral feet with foot pumps in place. Pt able to perform straight leg raise with no assistance while changing bulky dressing.  Past Medical History:  Diagnosis Date  . Anxiety   .  Atrial fibrillation ([place])   . Basal cell cancer 2008   on shoulder  . Bipolar disorder ([place])    hospitalized in past  . CVA (cerebral vascular accident) ([place]) 8/11  . Generalized anxiety disorder   . GERD (gastroesophageal reflux disease)   . History of kidney stones    30 years ago  . Macular degeneration   . Osteoarthritis    in neck and left knee  . Tremor    noted when writing   Assessment/Plan: 2 Days Post-Op Procedure(s) (LRB): COMPUTER ASSISTED TOTAL KNEE ARTHROPLASTY (Left) Active Problems:   S/P total knee arthroplasty  Estimated body mass index is 25.82 kg/m as calculated from the following:   Height as of this encounter: 5\' 6"  (1.676 m).   Weight as of this encounter: 72.6 kg (160 lb). Up with therapy    Labs reviewed, Platelets 117, Hg 12.4 this AM.  Na 134 this AM HR 106, states that he has a high HR at home, no leukocytosis, denies any abd pain or chest pain. Encouraged continue diet intake. Bulky dressing removed today as well as hemovac.  Sterile 4x4 with tegaderm applied. Pt will need a BM prior to discharge.  FLEET enema on board, will add lactulose Up with  PT today, currently recommending SNF.  Care management to assist with discharge.  DVT Prophylaxis - Coumadin, Foot Pumps and TED hose Weight-Bearing as tolerated to left leg  J. Cameron Proud, PA-C [facility] Orthopaedic Surgery 12/05/2015, 7:50 AM

## 2015-12-05 NOTE — Progress Notes (Signed)
Physical Therapy Treatment Patient Details Name: GEARL ALSOBROOKS MRN: [id number] DOB: 1931/01/30 Today's Date: 12/05/2015    History of Present Illness Pt is a 82 yr old male s/p elective L TKA 8/23. PMH significant for AFib, basal cell CA, CHF, anxiety, bipolar disorder, L shoulder bursitis, and h/o R sided CVA 2011.    PT Comments    Pt is requiring a lot of repetition of instructions for therapy today, possibly more than last visit.  He is fearful he cannot do any moving but still is able to take steps.  Will need to build confidence to tolerate more gait and ROM to L Knee.  Continue acutely until DC.    Follow Up Recommendations  SNF     Equipment Recommendations  None recommended by PT    Recommendations for Other Services Rehab consult     Precautions / Restrictions Precautions Precautions: Knee;Fall Restrictions Weight Bearing Restrictions: Yes LLE Weight Bearing: Weight bearing as tolerated    Mobility  Bed Mobility               General bed mobility comments: up in chair when PT arrived  Transfers Overall transfer level: Needs assistance Equipment used: Rolling walker (2 wheeled);1 person hand held assist Transfers: Sit to/from Stand Sit to Stand: Max assist         General transfer comment: Difficulty finding balance in stand, cues for hand placement. Retropulsive tendencies  Ambulation/Gait Ambulation/Gait assistance: Mod assist Ambulation Distance (Feet): 5 Feet Assistive device: Rolling walker (2 wheeled) Gait Pattern/deviations: Step-to pattern;Step-through pattern;Wide base of support;Shuffle;Decreased weight shift to left;Decreased stance time - left Gait velocity: slow Gait velocity interpretation: Below normal speed for age/gender General Gait Details: dense cues for shifting to L in standing to match the chair to sit   Stairs            Wheelchair Mobility    Modified Rankin (Stroke Patients Only)       Balance Overall  balance assessment: Needs assistance Sitting-balance support: Feet supported Sitting balance-Leahy Scale: Fair     Standing balance support: Bilateral upper extremity supported Standing balance-Leahy Scale: Poor                      Cognition Arousal/Alertness: Awake/alert Behavior During Therapy: WFL for tasks assessed/performed Overall Cognitive Status: Within Functional Limits for tasks assessed                      Exercises Total Joint Exercises Ankle Circles/Pumps: AROM;Both;5 reps (standing toe raises x 10) Quad Sets: AROM;Both;10 reps Gluteal Sets: AROM;Both;10 reps Hip ABduction/ADduction: AAROM;Both;10 reps Long Arc Quad: AAROM;AROM;Both;10 reps Knee Flexion: AAROM;Both;10 reps    General Comments        Pertinent Vitals/Pain Pain Assessment: Faces Faces Pain Scale: Hurts even more Pain Location: L knee with stretch to sit Pain Descriptors / Indicators: Operative site guarding Pain Intervention(s): Monitored during session;Premedicated before session;Repositioned;Ice applied (Pt had pain and stated he broke a stitch, but let him see OK)    Home Living                      Prior Function            PT Goals (current goals can now be found in the care plan section) Acute Rehab PT Goals Patient Stated Goal: To return to PLOF    Frequency  7X/week    PT Plan Current plan remains appropriate  Co-evaluation             End of Session   Activity Tolerance: Patient limited by fatigue;Patient limited by pain (weakness and stiffness) Patient left: in chair;with call bell/phone within reach;with chair alarm set     Time: [id number] PT Time Calculation (min) (ACUTE ONLY): 23 min  Charges:  $Gait Training: 8-22 mins $Therapeutic Exercise: 8-22 mins                    G Codes:      Ramond Dial 2015/12/10, 10:48 AM   Mee Hives, PT MS Acute Rehab Dept. Number: [place]

## 2015-12-05 NOTE — Discharge Summary (Signed)
Physician Discharge Summary  Patient ID: David Duncan MRN: [id number] DOB/AGE: January 27, 1931 82 y.o.  Admit date: 12/03/2015 Discharge date: 12/05/2015  Admission Diagnoses:  OSTEOARTHRITIS Degenerative arthrosis of the left knee, primary  Discharge Diagnoses: Patient Active Problem List   Diagnosis Date Noted  . S/P total knee arthroplasty 12/03/2015  . Routine general medical examination at a health care facility 08/08/2015  . Congestive cardiomyopathy ([place]) 03/18/2015  . Cerebrovascular accident (CVA) ([place]) 03/18/2015  . Spinal stenosis in cervical region 02/05/2015  . Cervical spinal stenosis 02/02/2015  . Left shoulder pain 12/06/2014  . Cardiomyopathy, dilated ([place]) 09/16/2014  . Dyslipidemia 09/16/2014  . Cerebral vascular accident ([place]) 09/16/2014  . Heart valve disease 09/16/2014  . Chest pain 08/08/2014  . MI (mitral incompetence) 08/08/2014  . TI (tricuspid incompetence) 08/08/2014  . AF (paroxysmal atrial fibrillation) ([place]) 02/15/2014  . Atrial fibrillation, chronic ([place]) 01/16/2014  . Chronic systolic heart failure ([place]) 01/16/2014  . Chronic atrial fibrillation ([place]) 01/16/2014  . Breathlessness on exertion 12/19/2013  . Bunion, right foot 08/17/2013  . Macular degeneration 10/16/2012  . Encounter for Medicare annual wellness exam 08/16/2012  . Neck pain 02/19/2011  . Hearing decreased 12/16/2010  . Medication adverse effect 07/28/2010  . Prostate cancer screening 07/28/2010  . Long term (current) use of anticoagulants 07/22/2010  . HYPERLIPIDEMIA-MIXED 06/01/2010  . ATRIAL FIBRILLATION 11/28/2009  . ATRIAL FLUTTER 11/28/2009  . CVA 11/28/2009  . BIPOLAR AFFECTIVE DISORDER 02/20/2009  Degenerative arthrosis of the left knee, primary  Past Medical History:  Diagnosis Date  . Anxiety   . Atrial fibrillation ([place])   . Basal cell cancer 2008   on shoulder  . Bipolar disorder ([place])    hospitalized in past  . CVA (cerebral vascular accident) ([place])  8/11  . Generalized anxiety disorder   . GERD (gastroesophageal reflux disease)   . History of kidney stones    30 years ago  . Macular degeneration   . Osteoarthritis    in neck and left knee  . Tremor    noted when writing    Transfusion: None   Consultants (if any):   Discharged Condition: Improved  Hospital Course: David Duncan is an 82 y.o. male who was admitted 12/03/2015 with a diagnosis of degenerative arthrosis of the left knee and went to the operating room on 12/03/2015 and underwent the above named procedures.    Surgeries: Procedure(s): COMPUTER ASSISTED TOTAL KNEE ARTHROPLASTY on 12/03/2015 Patient tolerated the surgery well. Taken to PACU where she was stabilized and then transferred to the orthopedic floor.  Patient was continued on Warfarin per recommendations from pharmacy. Foot pumps applied bilaterally at 80 mm. Heels elevated on bed with rolled towels. No evidence of DVT. Negative [place]oman. Physical therapy started on day #1 for gait training and transfer. OT started day #1 for ADL and assisted devices.  Patient's IV and Foley were d/c on POD1.  Hemovac removed on POD2.  Implants: DePuy Attune size 5 posterior stabilized femoral component (cemented), size 5 rotating platform tibial component (cemented), 35 mm medialized dome patella (cemented), and a 5 mm stabilized rotating platform polyethylene insert.  He was given perioperative antibiotics:  Anti-infectives    Start     Dose/Rate Route Frequency Ordered Stop   12/03/15 1330  ceFAZolin (ANCEF) IVPB 2g/100 mL premix     2 g 200 mL/hr over 30 Minutes Intravenous Every 6 hours 12/03/15 1221 12/04/15 1329   12/03/15 0552  ceFAZolin (ANCEF) 2-4 GM/100ML-% IVPB  Comments:  Hallaji, Katharina Caper Ann: cabinet override      12/03/15 0552 12/03/15 1759   12/03/15 0251  ceFAZolin (ANCEF) IVPB 2g/100 mL premix     2 g 200 mL/hr over 30 Minutes Intravenous On call to O.R. 12/03/15 [id number] 12/03/15 [id number]    .  He was  given sequential compression devices, early ambulation, and Warfarin for DVT prophylaxis.  He benefited maximally from the hospital stay and there were no complications.    Recent vital signs:  Vitals:   12/05/15 0359 12/05/15 0738  BP: 129/79 132/70  Pulse: (!) 108 (!) 106  Resp: 19 18  Temp: 99.2 F (37.3 C) 99.2 F (37.3 C)    Recent laboratory studies:  Lab Results  Component Value Date   HGB 12.4 (L) 12/05/2015   HGB 13.7 12/04/2015   HGB 15.1 11/19/2015   Lab Results  Component Value Date   WBC 9.9 12/05/2015   PLT 117 (L) 12/05/2015   Lab Results  Component Value Date   INR 1.75 12/05/2015   Lab Results  Component Value Date   NA 134 (L) 12/05/2015   K 4.5 12/05/2015   CL 102 12/05/2015   CO2 27 12/05/2015   BUN 14 12/05/2015   CREATININE 0.95 12/05/2015   GLUCOSE 121 (H) 12/05/2015    Discharge Medications:     Medication List    TAKE these medications   B COMPLEX PO Take by mouth 2 (two) times daily.   buPROPion 75 MG tablet Commonly known as:  WELLBUTRIN Take 1 tablet (75 mg total) by mouth every morning.   escitalopram 10 MG tablet Commonly known as:  LEXAPRO Take 1 tablet (10 mg total) by mouth every morning. Please dispense 10mg  qam #90   Lutein 6 MG Caps Take 6 mg by mouth daily.   metoprolol tartrate 25 MG tablet Commonly known as:  LOPRESSOR Take 25 mg by mouth 2 (two) times daily.   NATURE-THROID 81.25 MG Tabs Generic drug:  Thyroid Take 1 tablet by mouth daily.   oxyCODONE 5 MG immediate release tablet Commonly known as:  Oxy IR/ROXICODONE Take 1-2 tablets (5-10 mg total) by mouth every 4 (four) hours as needed for severe pain or breakthrough pain.   potassium chloride 10 MEQ CR capsule Commonly known as:  MICRO-K Take 10 mEq by mouth daily.   pyridOXINE 25 MG tablet Commonly known as:  VITAMIN B-6 Take 25 mg by mouth 2 (two) times daily.   traMADol 50 MG tablet Commonly known as:  ULTRAM Take 1-2 tablets (50-100 mg  total) by mouth every 6 (six) hours as needed for moderate pain.   Vitamin B-12 2500 MCG Subl Take 2 tabs by mouth daily   vitamin C 500 MG tablet Commonly known as:  ASCORBIC ACID Take 500 mg by mouth 2 (two) times daily.   Vitamin D-3 5000 UNITS Tabs Take 1 tablet by mouth daily.   warfarin 5 MG tablet Commonly known as:  COUMADIN Use as directed by anticoagulation clinic What changed:  additional instructions   warfarin 2.5 MG tablet Commonly known as:  COUMADIN Take 2.5 mg by mouth daily at 6 PM. Take 1 tablet daily 5 days a week and on the 6th day take 2 tablets What changed:  Another medication with the same name was changed. Make sure you understand how and when to take each.       Diagnostic Studies: Dg Knee Left Port  Result Date: 12/03/2015 CLINICAL DATA:  Patient is status  post left knee replacement EXAM: PORTABLE LEFT KNEE - 1-2 VIEW COMPARISON:  None. FINDINGS: Two-view portable study at 1111 hours shows tricompartmental knee replacement. Surgical drains overlie the anterior soft tissues. Skin staples are evident at the anterior midline. IMPRESSION: Status post tricompartmental knee replacement without evidence for immediate hardware complications. Electronically Signed   By: Misty Stanley M.D.   On: 12/03/2015 11:41   Disposition: Plan will be for discharge either today or tomorrow to SNF pending PT and a bowel movement.  Follow-up Information    Feliberto Gottron, PA-C On 12/18/2015.   Specialties:  Orthopedic Surgery, Emergency Medicine Why:  at 2:15pm Contact information: [place] [address] [phone]        Dereck Leep, MD On 01/15/2016.   Specialty:  Orthopedic Surgery Why:  at 11:15am Contact information: [place] [address] [phone]          Signed: Judson Roch PA-C 12/05/2015, 7:57 AM

## 2015-12-05 NOTE — Progress Notes (Signed)
Report called to Oliver at [facility]. Pt going to room 223. IV's removed, belongings packed. EMS called.

## 2015-12-05 NOTE — Progress Notes (Signed)
Clinical Education officer, museum (CSW) met with patient and his wife [name] was on the phone with him. CSW followed up to get SNF choice. Mary requested a private room at [facility]. Per Maudie Mercury admissions coordinator at [facility] they do have a private room for patient. Patient and wife accepted bed offer from [facility]. Wife requested that patient discharge on Saturday because he needs a 3 night stay for Medicare. CSW explained to wife that patient has [employer] a Medicare Advantage plan, which does not require a 3 night qualifying inpatient stay in a hospital. Wife verbalized her understanding. Amy [employer] case manager is aware of above. CSW will continue to follow and assist as needed.   [employer], LCSW [phone]

## 2015-12-05 NOTE — Care Management Important Message (Signed)
Important Message  Patient Details  Name: NAKEEM TRZCINSKI MRN: [id number] Date of Birth: 10-15-1930   Medicare Important Message Given:  Yes    Katrina Stack, RN 12/05/2015, 10:12 AM

## 2015-12-08 DIAGNOSIS — E039 Hypothyroidism, unspecified: Secondary | ICD-10-CM | POA: Diagnosis not present

## 2015-12-08 DIAGNOSIS — M159 Polyosteoarthritis, unspecified: Secondary | ICD-10-CM | POA: Diagnosis not present

## 2015-12-08 DIAGNOSIS — I509 Heart failure, unspecified: Secondary | ICD-10-CM | POA: Diagnosis not present

## 2015-12-08 DIAGNOSIS — F329 Major depressive disorder, single episode, unspecified: Secondary | ICD-10-CM | POA: Diagnosis not present

## 2015-12-08 DIAGNOSIS — I4891 Unspecified atrial fibrillation: Secondary | ICD-10-CM | POA: Diagnosis not present

## 2015-12-09 ENCOUNTER — Non-Acute Institutional Stay (SKILLED_NURSING_FACILITY): Payer: PPO | Admitting: Gerontology

## 2015-12-09 DIAGNOSIS — I480 Paroxysmal atrial fibrillation: Secondary | ICD-10-CM | POA: Diagnosis not present

## 2015-12-09 DIAGNOSIS — G8918 Other acute postprocedural pain: Secondary | ICD-10-CM | POA: Diagnosis not present

## 2015-12-09 DIAGNOSIS — Z5181 Encounter for therapeutic drug level monitoring: Secondary | ICD-10-CM

## 2015-12-09 DIAGNOSIS — Z8673 Personal history of transient ischemic attack (TIA), and cerebral infarction without residual deficits: Secondary | ICD-10-CM | POA: Diagnosis not present

## 2015-12-09 LAB — PROTIME-INR
INR: 1.48
Prothrombin Time: 18.1 seconds — ABNORMAL HIGH (ref 11.4–15.2)

## 2015-12-10 NOTE — Progress Notes (Signed)
Location:      Place of Service:  SNF (31) Provider:  Toni Arthurs, NP-C  Loura Pardon, MD  Patient Care Team: Abner Greenspan, MD as PCP - General  Extended Emergency Contact Information Primary Emergency Contact: [facility] Address: [place], [place] [address] [place] Phone: [phone] Relation: Spouse  Code Status:  full Goals of care: Advanced Directive information Advanced Directives 12/03/2015  Does patient have an advance directive? Yes  Type of Advance Directive Living will  Does patient want to make changes to advanced directive? No - Patient declined  Copy of advanced directive(s) in chart? -  Would patient like information on creating an advanced directive? -     Chief Complaint  Patient presents with  . Medication Management    HPI:  Pt is a 82 y.o. male seen today for Medication Management of controlled substances used to control pain as a result of LTKA. The patient describes their pain as well controlled on current regimen. Average pain score is 8/10. Other modalities used to control symptoms include ice, therapies, tylenol. Pt has tried PRN Oxycodone, methocarbamol and tylenol in the past with unsatisfactory or incomplete relief of symptoms. He is also being seen  today for management of Coumadin (Warfarin) for VTE Prophylaxis in the setting of A-fib and LTKA. Pt has been complaint with medication regimen and is aware of dietary modifications needed. Pt is aware of importance of continued compliance with medication and testing. Pt has not displayed any adverse effects related to anticoagulant therapy such as unexplained or excessive bleeding, bruising, hematuria, hematemesis, melena. Heart rate is controlled. Vital signs are stable.  Past Medical History:  Diagnosis Date  . Anxiety   . Atrial fibrillation ([place])   . Basal cell cancer 2008   on shoulder  . Bipolar disorder ([place])    hospitalized in past  . CVA  (cerebral vascular accident) ([place]) 8/11  . Generalized anxiety disorder   . GERD (gastroesophageal reflux disease)   . History of kidney stones    30 years ago  . Macular degeneration   . Osteoarthritis    in neck and left knee  . Tremor    noted when writing   Past Surgical History:  Procedure Laterality Date  . BACK SURGERY  1991  . CATARACT EXTRACTION W/ INTRAOCULAR LENS IMPLANT Bilateral   . KNEE ARTHROPLASTY Left 12/03/2015   Procedure: COMPUTER ASSISTED TOTAL KNEE ARTHROPLASTY;  Surgeon: Dereck Leep, MD;  Location: [facility] ORS;  Service: Orthopedics;  Laterality: Left;  . SKIN CANCER EXCISION      No Known Allergies    Medication List       Accurate as of 12/09/15 11:59 PM. Always use your most recent med list.          B COMPLEX PO Take by mouth 2 (two) times daily.   buPROPion 75 MG tablet Commonly known as:  WELLBUTRIN Take 1 tablet (75 mg total) by mouth every morning.   escitalopram 10 MG tablet Commonly known as:  LEXAPRO Take 1 tablet (10 mg total) by mouth every morning. Please dispense 10mg  qam #90   Lutein 6 MG Caps Take 6 mg by mouth daily.   metoprolol tartrate 25 MG tablet Commonly known as:  LOPRESSOR Take 25 mg by mouth 2 (two) times daily.   NATURE-THROID 81.25 MG Tabs Generic drug:  Thyroid Take 1 tablet by mouth daily.   oxyCODONE 5  MG immediate release tablet Commonly known as:  Oxy IR/ROXICODONE Take 1-2 tablets (5-10 mg total) by mouth every 4 (four) hours as needed for severe pain or breakthrough pain.   potassium chloride 10 MEQ CR capsule Commonly known as:  MICRO-K Take 10 mEq by mouth daily.   pyridOXINE 25 MG tablet Commonly known as:  VITAMIN B-6 Take 25 mg by mouth 2 (two) times daily.   traMADol 50 MG tablet Commonly known as:  ULTRAM Take 1-2 tablets (50-100 mg total) by mouth every 6 (six) hours as needed for moderate pain.   Vitamin B-12 2500 MCG Subl Take 2 tabs by mouth daily   vitamin C 500 MG  tablet Commonly known as:  ASCORBIC ACID Take 500 mg by mouth 2 (two) times daily.   Vitamin D-3 5000 UNITS Tabs Take 1 tablet by mouth daily.   warfarin 5 MG tablet Commonly known as:  COUMADIN Use as directed by anticoagulation clinic   warfarin 2.5 MG tablet Commonly known as:  COUMADIN Take 2.5 mg by mouth daily at 6 PM. Take 1 tablet daily 5 days a week and on the 6th day take 2 tablets       Review of Systems  Constitutional: Negative for activity change, appetite change, chills, diaphoresis and fever.  HENT: Negative for congestion, sneezing, sore throat, trouble swallowing and voice change.   Eyes: Negative for pain, redness and visual disturbance.  Respiratory: Negative for apnea, cough, choking, chest tightness, shortness of breath and wheezing.   Cardiovascular: Negative for chest pain, palpitations and leg swelling.  Gastrointestinal: Negative for abdominal distention, abdominal pain, constipation, diarrhea and nausea.  Genitourinary: Negative for difficulty urinating, dysuria, frequency and urgency.  Musculoskeletal: Positive for arthralgias (typical arthritis), gait problem and joint swelling. Negative for back pain and myalgias.  Skin: Positive for wound (incision). Negative for color change, pallor and rash.  Neurological: Negative for dizziness, tremors, syncope, speech difficulty, weakness, numbness and headaches.  Psychiatric/Behavioral: Negative for agitation and behavioral problems.  All other systems reviewed and are negative.   Immunization History  Administered Date(s) Administered  . Td 04/13/2003   Pertinent  Health Maintenance Due  Topic Date Due  . PNA vac Low Risk Adult (1 of 2 - PCV13) 09/05/1997  . INFLUENZA VACCINE  11/11/2015   Fall Risk  08/17/2013 08/16/2012  Falls in the past year? Yes No  Number falls in past yr: 2 or more -   Functional Status Survey:    Vitals:   12/09/15 0500  BP: (!) 148/50  Pulse: (!) 54  Resp: 16  Temp: (!)  96 F (35.6 C)  SpO2: 95%  Weight: 163 lb 11.2 oz (74.3 kg)   Body mass index is 26.42 kg/m. Physical Exam  Constitutional: He is oriented to person, place, and time. He appears well-developed and well-nourished. No distress.  Eyes: Conjunctivae and EOM are normal. Pupils are equal, round, and reactive to light. Right eye exhibits no discharge. Left eye exhibits no discharge.  Neck: No JVD present.  Cardiovascular: Normal rate, regular rhythm and normal heart sounds.  Exam reveals no gallop and no friction rub.   No murmur heard. Pulmonary/Chest: Effort normal. No respiratory distress. He has no wheezes. He has no rales. He exhibits no tenderness.  Abdominal: Soft. Bowel sounds are normal. He exhibits no distension. There is no tenderness. There is no guarding.  Musculoskeletal: He exhibits no edema or deformity.  Neurological: He is alert and oriented to person, place, and time.  Skin: Skin is warm and dry. No rash noted. He is not diaphoretic. No erythema. No pallor.  Psychiatric: He has a normal mood and affect. His behavior is normal. Judgment and thought content normal.    Labs reviewed:  Recent Labs  11/19/15 1435 12/04/15 0503 12/05/15 0450  NA 138 139 134*  K 4.6 4.3 4.5  CL 106 106 102  CO2 25 27 27   GLUCOSE 84 134* 121*  BUN 30* 13 14  CREATININE 0.86 0.92 0.95  CALCIUM 9.4 8.5* 8.4*    Recent Labs  11/19/15 1435  AST 30  ALT 25  ALKPHOS 62  BILITOT 0.9  PROT 6.9  ALBUMIN 3.6    Recent Labs  11/19/15 1435 12/04/15 0503 12/05/15 0450  WBC 6.5 8.5 9.9  HGB 15.1 13.7 12.4*  HCT 43.7 39.9* 35.1*  MCV 98.0 99.1 97.3  PLT 111* 130* 117*   Lab Results  Component Value Date   TSH 0.92 08/13/2013   Lab Results  Component Value Date   HGBA1C  11/12/2009    5.3 (NOTE)                                                                       According to the ADA Clinical Practice Recommendations for 2011, when HbA1c is used as a screening test:   >=6.5%    Diagnostic of Diabetes Mellitus           (if abnormal result  is confirmed)  5.7-6.4%   Increased risk of developing Diabetes Mellitus  References:Diagnosis and Classification of Diabetes Mellitus,Diabetes [id number] 1):S62-S69 and Standards of Medical Care in         Diabetes - 2011,Diabetes Care,2011,34  (Suppl 1):S11-S61.   Lab Results  Component Value Date   CHOL 197 08/13/2013   HDL 56.40 08/13/2013   LDLCALC 127 (H) 08/13/2013   TRIG 66.0 08/13/2013   CHOLHDL 3 08/13/2013    Significant Diagnostic Results in last 30 days:  Dg Knee Left Port  Result Date: 12/03/2015 CLINICAL DATA:  Patient is status post left knee replacement EXAM: PORTABLE LEFT KNEE - 1-2 VIEW COMPARISON:  None. FINDINGS: Two-view portable study at 1111 hours shows tricompartmental knee replacement. Surgical drains overlie the anterior soft tissues. Skin staples are evident at the anterior midline. IMPRESSION: Status post tricompartmental knee replacement without evidence for immediate hardware complications. Electronically Signed   By: Misty Stanley M.D.   On: 12/03/2015 11:41    Assessment/Plan 1. Encounter for therapeutic drug monitoring  Coumadin 3 mg PO Q Day at 1700 for Atrial Fibrillation and LTKA  Recheck INR 3 days  Monitor for s/s of bleeding, bruising, hematuria, hematemesis, melena  Check INR 3 days after initiation of antibiotic, when applicable.  Lovenox 40 mg SQ Q Day until Coumadin is therapeutic  2. Pain, acute postoperative  Continue complementary therapies including:  PT/OT  Restorative Nursing  Ice pack to site QID and prn  Diversional activities  Repositioning Q2 hours and prn  Scheduled Tylenol 650 mg po QID  Prescription written and forwarded to pharmacy for:   Oxycodone 5 mg 1 tablet PO Q 3 hours prn pain; severe; #120, no refill  Oxycodone 5 mg 1 tablet PO TID scheduled for pain. Hold for  sedation   Family/ staff Communication:   Total  Time:  Documentation:  Face to Face:  Family/Phone:   Labs/tests ordered:  PT/INR in 3 days  Medication list reviewed and assessed for continued appropriateness. It is my medical opinion discontinuation of medications would result in decompensation and decreased quality of life.   Vikki Ports, NP-C Geriatrics [facility] [phone], [place] [address] Cell Phone (Mon-Fri 8am-5pm):  [phone] On Call:  [phone] & follow prompts after 5pm & weekends Office Phone:  [phone] Office Fax:  [phone]

## 2015-12-11 DIAGNOSIS — I480 Paroxysmal atrial fibrillation: Secondary | ICD-10-CM | POA: Diagnosis not present

## 2015-12-11 LAB — PROTIME-INR
INR: 1.47
Prothrombin Time: 18 seconds — ABNORMAL HIGH (ref 11.4–15.2)

## 2015-12-12 ENCOUNTER — Encounter
Admission: RE | Admit: 2015-12-12 | Discharge: 2015-12-12 | Disposition: A | Discharge status: Home / Self Care | Payer: PPO | Source: Ambulatory Visit | Attending: Internal Medicine | Admitting: Internal Medicine

## 2015-12-12 DIAGNOSIS — Z8673 Personal history of transient ischemic attack (TIA), and cerebral infarction without residual deficits: Secondary | ICD-10-CM | POA: Diagnosis not present

## 2015-12-12 DIAGNOSIS — K219 Gastro-esophageal reflux disease without esophagitis: Secondary | ICD-10-CM | POA: Diagnosis not present

## 2015-12-12 DIAGNOSIS — Z7901 Long term (current) use of anticoagulants: Secondary | ICD-10-CM | POA: Diagnosis not present

## 2015-12-12 DIAGNOSIS — M4802 Spinal stenosis, cervical region: Secondary | ICD-10-CM | POA: Diagnosis not present

## 2015-12-12 DIAGNOSIS — E785 Hyperlipidemia, unspecified: Secondary | ICD-10-CM | POA: Diagnosis not present

## 2015-12-12 DIAGNOSIS — Z96652 Presence of left artificial knee joint: Secondary | ICD-10-CM | POA: Diagnosis not present

## 2015-12-12 DIAGNOSIS — M6281 Muscle weakness (generalized): Secondary | ICD-10-CM | POA: Diagnosis not present

## 2015-12-12 DIAGNOSIS — F319 Bipolar disorder, unspecified: Secondary | ICD-10-CM | POA: Diagnosis not present

## 2015-12-12 DIAGNOSIS — R262 Difficulty in walking, not elsewhere classified: Secondary | ICD-10-CM | POA: Diagnosis not present

## 2015-12-12 DIAGNOSIS — I5022 Chronic systolic (congestive) heart failure: Secondary | ICD-10-CM | POA: Diagnosis not present

## 2015-12-12 DIAGNOSIS — I480 Paroxysmal atrial fibrillation: Secondary | ICD-10-CM | POA: Insufficient documentation

## 2015-12-12 DIAGNOSIS — R41841 Cognitive communication deficit: Secondary | ICD-10-CM | POA: Diagnosis not present

## 2015-12-12 DIAGNOSIS — F411 Generalized anxiety disorder: Secondary | ICD-10-CM | POA: Diagnosis not present

## 2015-12-12 DIAGNOSIS — H3530 Unspecified macular degeneration: Secondary | ICD-10-CM | POA: Diagnosis not present

## 2015-12-12 DIAGNOSIS — Z471 Aftercare following joint replacement surgery: Secondary | ICD-10-CM | POA: Diagnosis not present

## 2015-12-12 DIAGNOSIS — Z5181 Encounter for therapeutic drug level monitoring: Secondary | ICD-10-CM | POA: Diagnosis not present

## 2015-12-16 DIAGNOSIS — I480 Paroxysmal atrial fibrillation: Secondary | ICD-10-CM | POA: Diagnosis not present

## 2015-12-16 LAB — PROTIME-INR
INR: 2.03
PROTHROMBIN TIME: 23.3 s — AB (ref 11.4–15.2)

## 2015-12-18 DIAGNOSIS — M25562 Pain in left knee: Secondary | ICD-10-CM | POA: Diagnosis not present

## 2015-12-18 DIAGNOSIS — Z471 Aftercare following joint replacement surgery: Secondary | ICD-10-CM | POA: Diagnosis not present

## 2015-12-22 DIAGNOSIS — M25562 Pain in left knee: Secondary | ICD-10-CM | POA: Diagnosis not present

## 2015-12-24 DIAGNOSIS — M25562 Pain in left knee: Secondary | ICD-10-CM | POA: Diagnosis not present

## 2015-12-24 DIAGNOSIS — I482 Chronic atrial fibrillation: Secondary | ICD-10-CM | POA: Diagnosis not present

## 2015-12-24 DIAGNOSIS — G8929 Other chronic pain: Secondary | ICD-10-CM | POA: Diagnosis not present

## 2015-12-24 DIAGNOSIS — M7581 Other shoulder lesions, right shoulder: Secondary | ICD-10-CM | POA: Diagnosis not present

## 2015-12-24 DIAGNOSIS — M25512 Pain in left shoulder: Secondary | ICD-10-CM | POA: Diagnosis not present

## 2015-12-24 DIAGNOSIS — M75112 Incomplete rotator cuff tear or rupture of left shoulder, not specified as traumatic: Secondary | ICD-10-CM | POA: Diagnosis not present

## 2015-12-24 DIAGNOSIS — M25511 Pain in right shoulder: Secondary | ICD-10-CM | POA: Diagnosis not present

## 2015-12-26 DIAGNOSIS — M25562 Pain in left knee: Secondary | ICD-10-CM | POA: Diagnosis not present

## 2015-12-29 DIAGNOSIS — M25562 Pain in left knee: Secondary | ICD-10-CM | POA: Diagnosis not present

## 2015-12-30 ENCOUNTER — Telehealth: Payer: Self-pay | Admitting: Family Medicine

## 2015-12-30 DIAGNOSIS — Z0000 Encounter for general adult medical examination without abnormal findings: Secondary | ICD-10-CM

## 2015-12-30 DIAGNOSIS — Z125 Encounter for screening for malignant neoplasm of prostate: Secondary | ICD-10-CM

## 2015-12-30 NOTE — Telephone Encounter (Signed)
It's better if he fasts but if that is too hard-just tell him to eat light I included a PSA test- if he does NOT want this-please cancel it/don't include it  Thanks! Unsure if he will want to PSA or not

## 2015-12-30 NOTE — Telephone Encounter (Signed)
-----   Message from Eustace Pen, LPN sent at [id number]  4:16 PM EDT ----- Regarding: Lab Orders for 12/31/15 Dr. Glori Bickers,  Pt coming in for AWV on 9/20 and has CPE scheduled for 9/27. Please place lab orders. If non-fasting, please respond to this message so I can call pt to tell him to eat breakfast.   Thanks, Katha Cabal :)

## 2015-12-31 ENCOUNTER — Ambulatory Visit (INDEPENDENT_AMBULATORY_CARE_PROVIDER_SITE_OTHER): Payer: PPO

## 2015-12-31 VITALS — BP 110/60 | HR 67 | Temp 97.8°F | Ht 66.0 in | Wt 160.5 lb

## 2015-12-31 DIAGNOSIS — Z125 Encounter for screening for malignant neoplasm of prostate: Secondary | ICD-10-CM | POA: Diagnosis not present

## 2015-12-31 DIAGNOSIS — Z0000 Encounter for general adult medical examination without abnormal findings: Secondary | ICD-10-CM

## 2015-12-31 DIAGNOSIS — M25562 Pain in left knee: Secondary | ICD-10-CM | POA: Diagnosis not present

## 2015-12-31 LAB — CBC WITH DIFFERENTIAL/PLATELET
BASOS ABS: 0 10*3/uL (ref 0.0–0.1)
Basophils Relative: 0.2 % (ref 0.0–3.0)
Eosinophils Absolute: 0.1 10*3/uL (ref 0.0–0.7)
Eosinophils Relative: 1.1 % (ref 0.0–5.0)
HCT: 40.4 % (ref 39.0–52.0)
Hemoglobin: 13.6 g/dL (ref 13.0–17.0)
LYMPHS ABS: 1.2 10*3/uL (ref 0.7–4.0)
Lymphocytes Relative: 11 % — ABNORMAL LOW (ref 12.0–46.0)
MCHC: 33.6 g/dL (ref 30.0–36.0)
MCV: 101 fl — AB (ref 78.0–100.0)
MONO ABS: 1.1 10*3/uL — AB (ref 0.1–1.0)
MONOS PCT: 10.2 % (ref 3.0–12.0)
NEUTROS ABS: 8.1 10*3/uL — AB (ref 1.4–7.7)
NEUTROS PCT: 77.5 % — AB (ref 43.0–77.0)
PLATELETS: 175 10*3/uL (ref 150.0–400.0)
RBC: 4 Mil/uL — ABNORMAL LOW (ref 4.22–5.81)
RDW: 18.9 % — ABNORMAL HIGH (ref 11.5–15.5)
WBC: 10.5 10*3/uL (ref 4.0–10.5)

## 2015-12-31 LAB — COMPREHENSIVE METABOLIC PANEL
ALT: 28 U/L (ref 0–53)
AST: 48 U/L — ABNORMAL HIGH (ref 0–37)
Albumin: 3.5 g/dL (ref 3.5–5.2)
Alkaline Phosphatase: 97 U/L (ref 39–117)
BUN: 34 mg/dL — ABNORMAL HIGH (ref 6–23)
CO2: 28 meq/L (ref 19–32)
Calcium: 9 mg/dL (ref 8.4–10.5)
Chloride: 100 mEq/L (ref 96–112)
Creatinine, Ser: 1.07 mg/dL (ref 0.40–1.50)
GFR: 70.1 mL/min (ref >60.00)
GLUCOSE: 85 mg/dL (ref 70–99)
POTASSIUM: 5.2 meq/L — AB (ref 3.5–5.1)
SODIUM: 133 meq/L — AB (ref 135–145)
Total Bilirubin: 1.2 mg/dL (ref 0.2–1.2)
Total Protein: 7 g/dL (ref 6.0–8.3)

## 2015-12-31 LAB — TSH: TSH: 0.32 u[IU]/mL — ABNORMAL LOW (ref 0.35–4.50)

## 2015-12-31 LAB — LIPID PANEL
CHOL/HDL RATIO: 3
Cholesterol: 132 mg/dL (ref 0–200)
HDL: 43 mg/dL (ref >39.00)
LDL Cholesterol: 75 mg/dL (ref 0–99)
NONHDL: 88.59
Triglycerides: 66 mg/dL (ref 0.0–149.0)
VLDL: 13.2 mg/dL (ref 0.0–40.0)

## 2015-12-31 LAB — PSA: PSA: 1.11 ng/mL (ref 0.10–4.00)

## 2015-12-31 NOTE — Progress Notes (Addendum)
PCP notes:   Health maintenance:  Flu - declined PNA vaccine - declined Tetanus - pt will obtain at CPE if covered by insurance  Abnormal screenings:   Hearing - failed  Patient concerns:   None  Nurse concerns:  None  Next PCP appt:   01/07/16 @ 1030  I reviewed health advisor's note, was available for consultation, and agree with documentation and plan. Loura Pardon MD

## 2015-12-31 NOTE — Progress Notes (Signed)
Subjective:   David Duncan is a 82 y.o. male who presents for Medicare Annual/Subsequent preventive examination.  Review of Systems:  N/A Cardiac Risk Factors include: advanced age (>23men, >15 women);dyslipidemia;male gender     Objective:    Vitals: BP 110/60 (BP Location: Left Arm, Patient Position: Sitting, Cuff Size: Normal)   Pulse 67   Temp 97.8 F (36.6 C) (Oral)   Ht 5\' 6"  (1.676 m) Comment: no shoes  Wt 160 lb 8 oz (72.8 kg)   SpO2 98%   BMI 25.91 kg/m   Body mass index is 25.91 kg/m.  Tobacco History  Smoking Status  . Never Smoker  Smokeless Tobacco  . Never Used     Counseling given: No   Past Medical History:  Diagnosis Date  . Anxiety   . Atrial fibrillation ([place])   . Basal cell cancer 2008   on shoulder  . Bipolar disorder ([place])    hospitalized in past  . CVA (cerebral vascular accident) ([place]) 8/11  . Generalized anxiety disorder   . GERD (gastroesophageal reflux disease)   . History of kidney stones    30 years ago  . Macular degeneration   . Osteoarthritis    in neck and left knee  . Tremor    noted when writing   Past Surgical History:  Procedure Laterality Date  . BACK SURGERY  1991  . CATARACT EXTRACTION W/ INTRAOCULAR LENS IMPLANT Bilateral   . KNEE ARTHROPLASTY Left 12/03/2015   Procedure: COMPUTER ASSISTED TOTAL KNEE ARTHROPLASTY;  Surgeon: Dereck Leep, MD;  Location: [facility] ORS;  Service: Orthopedics;  Laterality: Left;  . SKIN CANCER EXCISION     Family History  Problem Relation Age of Onset  . Stroke Father   . Cancer Brother     prostate  . Coronary artery disease Brother   . Hypertension Brother    History  Sexual Activity  . Sexual activity: No    Outpatient Encounter Prescriptions as of 12/31/2015  Medication Sig  . Ascorbic Acid (VITAMIN C) 500 MG tablet Take 500 mg by mouth 2 (two) times daily.   . B Complex Vitamins (B COMPLEX PO) Take by mouth 2 (two) times daily.   [name] Kitchen buPROPion (WELLBUTRIN) 75 MG  tablet Take 1 tablet (75 mg total) by mouth every morning.  . Cholecalciferol (VITAMIN D-3) 5000 UNITS TABS Take 1 tablet by mouth daily.   . Cyanocobalamin (VITAMIN B-12) 2500 MCG SUBL Take 2 tabs by mouth daily   . escitalopram (LEXAPRO) 10 MG tablet Take 1 tablet (10 mg total) by mouth every morning. Please dispense 10mg  qam #90  . Lutein 6 MG CAPS Take 6 mg by mouth daily.   . metoprolol tartrate (LOPRESSOR) 25 MG tablet Take 25 mg by mouth 2 (two) times daily.   . potassium chloride (MICRO-K) 10 MEQ CR capsule Take 10 mEq by mouth daily.   [name] Kitchen pyridOXINE (VITAMIN B-6) 25 MG tablet Take 25 mg by mouth 2 (two) times daily.   . Thyroid (NATURE-THROID) 81.25 MG TABS Take 1 tablet by mouth daily.  [name] Kitchen warfarin (COUMADIN) 2.5 MG tablet Take 2.5 mg by mouth daily at 6 PM. Take 1 tablet daily 5 days a week and on the 6th day take 2 tablets  . warfarin (COUMADIN) 5 MG tablet Use as directed by anticoagulation clinic (Patient taking differently: Use as directed by anticoagulation clinic every 6th day)  . [DISCONTINUED] oxyCODONE (OXY IR/ROXICODONE) 5 MG immediate release tablet Take 1-2 tablets (5-10  mg total) by mouth every 4 (four) hours as needed for severe pain or breakthrough pain.  . [DISCONTINUED] traMADol (ULTRAM) 50 MG tablet Take 1-2 tablets (50-100 mg total) by mouth every 6 (six) hours as needed for moderate pain.   No facility-administered encounter medications on file as of 12/31/2015.     Activities of Daily Living In your present state of health, do you have any difficulty performing the following activities: 12/31/2015 12/03/2015  Hearing? Y N  Vision? N N  Difficulty concentrating or making decisions? N N  Walking or climbing stairs? N N  Dressing or bathing? N N  Doing errands, shopping? N N  Preparing Food and eating ? N -  Using the Toilet? N -  In the past six months, have you accidently leaked urine? N -  Do you have problems with loss of bowel control? N -  Managing your  Medications? N -  Managing your Finances? N -  Housekeeping or managing your Housekeeping? N -  Some recent data might be hidden    Patient Care Team: Abner Greenspan, MD as PCP - General Kennieth Francois, MD as Consulting Physician (Dermatology) Dereck Leep, MD as Consulting Physician (Orthopedic Surgery) Ronnell Freshwater, MD as Referring Physician (Ophthalmology)   Assessment:     Hearing Screening   125Hz  250Hz  500Hz  1000Hz  2000Hz  3000Hz  4000Hz  6000Hz  8000Hz   Right ear:   0 40 40  0    Left ear:   0 0 40  0    Vision Screening Comments: Last vision exam on 01/29/2015 with Dr. Joan Mayans   Exercise Activities and Dietary recommendations Current Exercise Habits: The patient does not participate in regular exercise at present, Exercise limited by: None identified  Goals    . Increase water intake          Starting 12/31/2015, I will attempt to drink at least 6-8 glasses of water daily.       Fall Risk Fall Risk  12/31/2015 08/17/2013 08/16/2012  Falls in the past year? No Yes No  Number falls in past yr: - 2 or more -   Depression Screen PHQ 2/9 Scores 12/31/2015 08/17/2013 08/16/2012  PHQ - 2 Score 0 0 0    Cognitive Testing MMSE - Mini Mental State Exam 12/31/2015  Orientation to time 5  Orientation to Place 5  Registration 3  Attention/ Calculation 0  Recall 3  Language- name 2 objects 0  Language- repeat 1  Language- follow 3 step command 3  Language- read & follow direction 0  Write a sentence 0  Copy design 0  Total score 20   PLEASE NOTE: A Mini-Cog screen was completed. Maximum score is 20. A value of 0 denotes this part of [place] MMSE was not completed or the patient failed this part of the Mini-Cog screening.   Mini-Cog Screening Orientation to Time - Max 5 pts Orientation to Place - Max 5 pts Registration - Max 3 pts Recall - Max 3 pts Language Repeat - Max 1 pts Language Follow 3 Step Command - Max 3 pts  Immunization History  Administered  Date(s) Administered  . Td 04/13/2003   Screening Tests Health Maintenance  Topic Date Due  . TETANUS/TDAP  01/07/2016 (Originally 04/12/2013)  . INFLUENZA VACCINE  12/30/2016 (Originally 11/11/2015)  . PNA vac Low Risk Adult (1 of 2 - PCV13) 12/30/2016 (Originally 09/05/1997)  . ZOSTAVAX  Addressed      Plan:     I  have personally reviewed and addressed the Medicare Annual Wellness questionnaire and have noted the following in the patient's chart:  A. Medical and social history B. Use of alcohol, tobacco or illicit drugs  C. Current medications and supplements D. Functional ability and status E.  Nutritional status F.  Physical activity G. Advance directives H. List of other physicians I.  Hospitalizations, surgeries, and ER visits in previous 12 months J.  [place] to include hearing, vision, cognitive, depression L. Referrals and appointments - none  In addition, I have reviewed and discussed with patient certain preventive protocols, quality metrics, and best practice recommendations. A written personalized care plan for preventive services as well as general preventive health recommendations were provided to patient.  See attached scanned questionnaire for additional information.   Signed,   Lindell Noe, MHA, BS, LPN Health Advisor

## 2015-12-31 NOTE — Patient Instructions (Signed)
David Duncan , Thank you for taking time to come for your Medicare Wellness Visit. I appreciate your ongoing commitment to your health goals. Please review the following plan we discussed and let me know if I can assist you in the future.   These are the goals we discussed: Goals    . Increase water intake          Starting 12/31/2015, I will attempt to drink at least 6-8 glasses of water daily.        This is a list of the screening recommended for you and due dates:  Health Maintenance  Topic Date Due  . Tetanus Vaccine  01/07/2016*  . Flu Shot  12/30/2016*  . Pneumonia vaccines (1 of 2 - PCV13) 12/30/2016*  . Shingles Vaccine  Addressed  *Topic was postponed. The date shown is not the original due date.   Preventive Care for Adults  A healthy lifestyle and preventive care can promote health and wellness. Preventive health guidelines for adults include the following key practices.  . A routine yearly physical is a good way to check with your health care provider about your health and preventive screening. It is a chance to share any concerns and updates on your health and to receive a thorough exam.  . Visit your dentist for a routine exam and preventive care every 6 months. Brush your teeth twice a day and floss once a day. Good oral hygiene prevents tooth decay and gum disease.  . The frequency of eye exams is based on your age, health, family medical history, use  of contact lenses, and other factors. Follow your health care provider's ecommendations for frequency of eye exams.  . Eat a healthy diet. Foods like vegetables, fruits, whole grains, low-fat dairy products, and lean protein foods contain the nutrients you need without too many calories. Decrease your intake of foods high in solid fats, added sugars, and salt. Eat the right amount of calories for you. Get information about a proper diet from your health care provider, if necessary.  . Regular physical exercise is one  of the most important things you can do for your health. Most adults should get at least 150 minutes of moderate-intensity exercise (any activity that increases your heart rate and causes you to sweat) each week. In addition, most adults need muscle-strengthening exercises on 2 or more days a week.  Silver Sneakers may be a benefit available to you. To determine eligibility, you may visit the website: www.silversneakers.com or contact program at 561-870-4548 Mon-Fri between 8AM-8PM.   . Maintain a healthy weight. The body mass index (BMI) is a screening tool to identify possible weight problems. It provides an estimate of body fat based on height and weight. Your health care provider can find your BMI and can help you achieve or maintain a healthy weight.   For adults 20 years and older: ? A BMI below 18.5 is considered underweight. ? A BMI of 18.5 to 24.9 is normal. ? A BMI of 25 to 29.9 is considered overweight. ? A BMI of 30 and above is considered obese.   . Maintain normal blood lipids and cholesterol levels by exercising and minimizing your intake of saturated fat. Eat a balanced diet with plenty of fruit and vegetables. Blood tests for lipids and cholesterol should begin at age 41 and be repeated every 5 years. If your lipid or cholesterol levels are high, you are over 50, or you are at high risk for heart disease,  you may need your cholesterol levels checked more frequently. Ongoing high lipid and cholesterol levels should be treated with medicines if diet and exercise are not working.  . If you smoke, find out from your health care provider how to quit. If you do not use tobacco, please do not start.  . If you choose to drink alcohol, please do not consume more than 2 drinks per day. One drink is considered to be 12 ounces (355 mL) of beer, 5 ounces (148 mL) of wine, or 1.5 ounces (44 mL) of liquor.  . If you are 50-25 years old, ask your health care provider if you should take aspirin  to prevent strokes.  . Use sunscreen. Apply sunscreen liberally and repeatedly throughout the day. You should seek shade when your shadow is shorter than you. Protect yourself by wearing long sleeves, pants, a wide-brimmed hat, and sunglasses year round, whenever you are outdoors.  . Once a month, do a whole body skin exam, using a mirror to look at the skin on your back. Tell your health care provider of new moles, moles that have irregular borders, moles that are larger than a pencil eraser, or moles that have changed in shape or color.

## 2015-12-31 NOTE — Progress Notes (Signed)
Pre visit review using our clinic review tool, if applicable. No additional management support is needed unless otherwise documented below in the visit note. 

## 2016-01-01 DIAGNOSIS — I482 Chronic atrial fibrillation: Secondary | ICD-10-CM | POA: Diagnosis not present

## 2016-01-01 DIAGNOSIS — C44311 Basal cell carcinoma of skin of nose: Secondary | ICD-10-CM | POA: Diagnosis not present

## 2016-01-01 DIAGNOSIS — L905 Scar conditions and fibrosis of skin: Secondary | ICD-10-CM | POA: Diagnosis not present

## 2016-01-05 DIAGNOSIS — M25562 Pain in left knee: Secondary | ICD-10-CM | POA: Diagnosis not present

## 2016-01-06 DIAGNOSIS — M545 Low back pain: Secondary | ICD-10-CM | POA: Diagnosis not present

## 2016-01-06 DIAGNOSIS — G8929 Other chronic pain: Secondary | ICD-10-CM | POA: Diagnosis not present

## 2016-01-06 DIAGNOSIS — Z96652 Presence of left artificial knee joint: Secondary | ICD-10-CM | POA: Diagnosis not present

## 2016-01-06 DIAGNOSIS — M47816 Spondylosis without myelopathy or radiculopathy, lumbar region: Secondary | ICD-10-CM | POA: Diagnosis not present

## 2016-01-07 ENCOUNTER — Encounter: Payer: Self-pay | Admitting: Family Medicine

## 2016-01-07 ENCOUNTER — Ambulatory Visit (INDEPENDENT_AMBULATORY_CARE_PROVIDER_SITE_OTHER): Payer: PPO | Admitting: Family Medicine

## 2016-01-07 ENCOUNTER — Other Ambulatory Visit: Payer: Self-pay | Admitting: Student

## 2016-01-07 VITALS — BP 122/70 | HR 121 | Temp 98.0°F | Ht 66.0 in | Wt 159.5 lb

## 2016-01-07 DIAGNOSIS — I482 Chronic atrial fibrillation, unspecified: Secondary | ICD-10-CM

## 2016-01-07 DIAGNOSIS — M25562 Pain in left knee: Secondary | ICD-10-CM | POA: Diagnosis not present

## 2016-01-07 DIAGNOSIS — E039 Hypothyroidism, unspecified: Secondary | ICD-10-CM

## 2016-01-07 DIAGNOSIS — M47816 Spondylosis without myelopathy or radiculopathy, lumbar region: Secondary | ICD-10-CM

## 2016-01-07 DIAGNOSIS — E785 Hyperlipidemia, unspecified: Secondary | ICD-10-CM | POA: Diagnosis not present

## 2016-01-07 DIAGNOSIS — H9193 Unspecified hearing loss, bilateral: Secondary | ICD-10-CM | POA: Diagnosis not present

## 2016-01-07 DIAGNOSIS — I420 Dilated cardiomyopathy: Secondary | ICD-10-CM

## 2016-01-07 DIAGNOSIS — Z96652 Presence of left artificial knee joint: Secondary | ICD-10-CM

## 2016-01-07 DIAGNOSIS — F319 Bipolar disorder, unspecified: Secondary | ICD-10-CM

## 2016-01-07 DIAGNOSIS — Z8042 Family history of malignant neoplasm of prostate: Secondary | ICD-10-CM

## 2016-01-07 DIAGNOSIS — Z0000 Encounter for general adult medical examination without abnormal findings: Secondary | ICD-10-CM

## 2016-01-07 NOTE — Progress Notes (Signed)
Subjective:    Patient ID: David Duncan, male    DOB: November 17, 1930, 82 y.o.   MRN: [id number]  HPI Here for health maintenance exam and to review chronic medical problems    Had L knee replaced- still has an area not fully closed and leaking a bit - ? Due to as stitch Not infected Will check again on Friday  Walking w/o a cane  Still in PT   Had AMW visit with Katha Cabal 9/20  Failed hearing test  Not bothering him  Had a skin cancer on face - L side of nose- was Thursday- still bandaged up / basal cell   Declines immunizations   He did have a zoster vaccine 1/09  Family hx of prostate cancer -2 brothers  Lab Results  Component Value Date   PSA 1.11 12/31/2015   PSA 0.57 07/13/2011   PSA 0.43 07/31/2010  is able to empty bladder pretty well  Nocturia usually 2 times- normal for him/ no changes  He can go back to sleep  Plans to get back to walking for exercise when released by his orthopedics  No falls at all     TSH is off  Lab Results  Component Value Date   TSH 0.32 (L) 12/31/2015   he takes nature thyroid  Dr Barton Fanny last renewed it     Hx of hyperlipidemia Lab Results  Component Value Date   CHOL 132 12/31/2015   CHOL 197 08/13/2013   CHOL 152 08/02/2012   Lab Results  Component Value Date   HDL 43.00 12/31/2015   HDL 56.40 08/13/2013   HDL 42.60 08/02/2012   Lab Results  Component Value Date   LDLCALC 75 12/31/2015   LDLCALC 127 (H) 08/13/2013   LDLCALC 97 08/02/2012   Lab Results  Component Value Date   TRIG 66.0 12/31/2015   TRIG 66.0 08/13/2013   TRIG 61.0 08/02/2012   Lab Results  Component Value Date   CHOLHDL 3 12/31/2015   CHOLHDL 3 08/13/2013   CHOLHDL 4 08/02/2012   No results found for: LDLDIRECT  Overall good report -expect HDL to go up when free to exercise     Hx of a fib on coumadin and metoprolol Pulse Readings from Last 3 Encounters:  01/07/16 (!) 121  12/31/15 67  12/09/15 (!) 54    BP Readings from Last 3  Encounters:  01/07/16 122/70  12/31/15 110/60  12/09/15 (!) 148/50      Chemistry      Component Value Date/Time   NA 133 (L) 12/31/2015 1501   K 5.2 (H) 12/31/2015 1501   CL 100 12/31/2015 1501   CO2 28 12/31/2015 1501   BUN 34 (H) 12/31/2015 1501   CREATININE 1.07 12/31/2015 1501      Component Value Date/Time   CALCIUM 9.0 12/31/2015 1501   ALKPHOS 97 12/31/2015 1501   AST 48 (H) 12/31/2015 1501   ALT 28 12/31/2015 1501   BILITOT 1.2 12/31/2015 1501     drinks water  Tends to have loose bowels   Sees psychiatry for his bipolar dz  Still struggling with it - more depressed lately  Has been depressed for 3 years- cannot seem to get it adjusted right  Takes tylenol pm to sleep   Patient Active Problem List   Diagnosis Date Noted  . Family history of prostate cancer 01/07/2016  . Hypothyroidism 01/07/2016  . S/P total knee arthroplasty 12/03/2015  . Routine general medical examination at a health  care facility 08/08/2015  . Congestive cardiomyopathy ([place]) 03/18/2015  . Cerebrovascular accident (CVA) ([place]) 03/18/2015  . Spinal stenosis in cervical region 02/05/2015  . Cervical spinal stenosis 02/02/2015  . Left shoulder pain 12/06/2014  . Cardiomyopathy, dilated ([place]) 09/16/2014  . Dyslipidemia 09/16/2014  . Cerebral vascular accident ([place]) 09/16/2014  . Heart valve disease 09/16/2014  . Chest pain 08/08/2014  . MI (mitral incompetence) 08/08/2014  . TI (tricuspid incompetence) 08/08/2014  . AF (paroxysmal atrial fibrillation) ([place]) 02/15/2014  . Atrial fibrillation, chronic ([place]) 01/16/2014  . Chronic systolic heart failure ([place]) 01/16/2014  . Chronic atrial fibrillation ([place]) 01/16/2014  . Breathlessness on exertion 12/19/2013  . Bunion, right foot 08/17/2013  . Macular degeneration 10/16/2012  . Encounter for Medicare annual wellness exam 08/16/2012  . Neck pain 02/19/2011  . Hearing decreased 12/16/2010  . Medication adverse effect 07/28/2010  . Prostate  cancer screening 07/28/2010  . Long term (current) use of anticoagulants 07/22/2010  . Hyperlipidemia 06/01/2010  . ATRIAL FIBRILLATION 11/28/2009  . ATRIAL FLUTTER 11/28/2009  . CVA 11/28/2009  . BIPOLAR AFFECTIVE DISORDER 02/20/2009   Past Medical History:  Diagnosis Date  . Anxiety   . Atrial fibrillation ([place])   . Basal cell cancer 2008   on shoulder  . Bipolar disorder ([place])    hospitalized in past  . CVA (cerebral vascular accident) ([place]) 8/11  . Generalized anxiety disorder   . GERD (gastroesophageal reflux disease)   . History of kidney stones    30 years ago  . Macular degeneration   . Osteoarthritis    in neck and left knee  . Tremor    noted when writing   Past Surgical History:  Procedure Laterality Date  . BACK SURGERY  1991  . CATARACT EXTRACTION W/ INTRAOCULAR LENS IMPLANT Bilateral   . KNEE ARTHROPLASTY Left 12/03/2015   Procedure: COMPUTER ASSISTED TOTAL KNEE ARTHROPLASTY;  Surgeon: Dereck Leep, MD;  Location: [facility] ORS;  Service: Orthopedics;  Laterality: Left;  . SKIN CANCER EXCISION     Social History  Substance Use Topics  . Smoking status: Never Smoker  . Smokeless tobacco: Never Used  . Alcohol use No   Family History  Problem Relation Age of Onset  . Stroke Father   . Cancer Brother     prostate  . Coronary artery disease Brother   . Hypertension Brother    No Known Allergies Current Outpatient Prescriptions on File Prior to Visit  Medication Sig Dispense Refill  . Ascorbic Acid (VITAMIN C) 500 MG tablet Take 500 mg by mouth 2 (two) times daily.     . B Complex Vitamins (B COMPLEX PO) Take by mouth 2 (two) times daily.     [name] Kitchen buPROPion (WELLBUTRIN) 75 MG tablet Take 1 tablet (75 mg total) by mouth every morning. 90 tablet 1  . Cholecalciferol (VITAMIN D-3) 5000 UNITS TABS Take 1 tablet by mouth daily.     . Cyanocobalamin (VITAMIN B-12) 2500 MCG SUBL Take 2 tabs by mouth daily     . escitalopram (LEXAPRO) 10 MG tablet Take 1 tablet (10  mg total) by mouth every morning. Please dispense 10mg  qam #90 90 tablet 1  . Lutein 6 MG CAPS Take 6 mg by mouth daily.     . metoprolol tartrate (LOPRESSOR) 25 MG tablet Take 25 mg by mouth 2 (two) times daily.     [name] Kitchen pyridOXINE (VITAMIN B-6) 25 MG tablet Take 25 mg by mouth 2 (two) times daily.     [name] Kitchen  warfarin (COUMADIN) 2.5 MG tablet Take 2.5 mg by mouth daily at 6 PM. Take 1 tablet daily 5 days a week and on the 6th day take 2 tablets    . warfarin (COUMADIN) 5 MG tablet Use as directed by anticoagulation clinic (Patient taking differently: Use as directed by anticoagulation clinic every 6th day) 90 tablet 1   No current facility-administered medications on file prior to visit.     Review of Systems     Objective:   Physical Exam  Constitutional: He appears well-developed and well-nourished. No distress.  Well appearing elderly male   HENT:  Head: Normocephalic and atraumatic.  Right Ear: External ear normal.  Left Ear: External ear normal.  Nose: Nose normal.  Mouth/Throat: Oropharynx is clear and moist.  Bandage on L side of nose  Eyes: Conjunctivae and EOM are normal. Pupils are equal, round, and reactive to light. Right eye exhibits no discharge. Left eye exhibits no discharge. No scleral icterus.  Neck: Normal range of motion. Neck supple. No JVD present. Carotid bruit is not present. No thyromegaly present.  Cardiovascular: Normal heart sounds and intact distal pulses.  Exam reveals no gallop.   Mild tachycardia- irreg irreg rhythm of a fib  Pulmonary/Chest: Effort normal and breath sounds normal. No respiratory distress. He has no wheezes. He exhibits no tenderness.  Abdominal: Soft. Bowel sounds are normal. He exhibits no distension, no abdominal bruit and no mass. There is no tenderness.  Musculoskeletal: He exhibits no edema or tenderness.  Lymphadenopathy:    He has no cervical adenopathy.  Neurological: He is alert. He has normal reflexes. No cranial nerve deficit. He  exhibits normal muscle tone. Coordination normal.  Skin: Skin is warm and dry. No rash noted. No erythema. No pallor.  Lentigines diffusely Some SKs  Bandage on L side of nose  Healing scar on L knee- midline, with small bandage in the middle- fairly good rom of knee  Psychiatric: His speech is normal and behavior is normal. Thought content normal. His affect is blunt.          Assessment & Plan:   Problem List Items Addressed This Visit      Cardiovascular and Mediastinum   Atrial fibrillation, chronic (HCC)    Pulse is up  Will stop his nature thyroid  Re check 1 mo  Continue cardiac f/u      Congestive cardiomyopathy ([place])    No c/o Enc regular cardiac f/u        Endocrine   Hypothyroidism    tsh is low and pulse is high (in setting of chronic a fib) inst to stop the nature thyroid  Re check tsh 1 mo  Then if needed will start levothyroxine        Other   BIPOLAR AFFECTIVE DISORDER    More depressed than manic for 3 y  He will disc this and insomnia with his psychiatrist  Adv d/c tylenol pm due to elevated lft       Family history of prostate cancer    Lab Results  Component Value Date   PSA 1.11 12/31/2015   PSA 0.57 07/13/2011   PSA 0.43 07/31/2010   No change in urinary habits  Continue to follow       Hearing decreased    Rev hearing screen  Pt not ready for audiology eval yet      Hyperlipidemia    Disc goals for lipids and reasons to control them Rev labs with pt  Rev low sat fat diet in detail Expect HDL to rise when he can exercise again       Routine general medical examination at a health care facility - Primary    Reviewed health habits including diet and exercise and skin cancer prevention Reviewed appropriate screening tests for age  Also reviewed health mt list, fam hx and immunization status , as well as social and family history   See HPI Rev AMW-pt does not want further eval of hearing at this time Declines immunizations-  noted  Enc psychiatric f/u for depressoin       S/P total knee arthroplasty    Doing well except for one small area of incision that is not closed  Will continue ortho f/u for that        Other Visit Diagnoses   None.

## 2016-01-07 NOTE — Patient Instructions (Addendum)
Stop your nature thyroid since TSH is low and your pulse is high (you may have too much thyroid hormone)  We will re check TSH and free T4 in 1 month and then decide how much levothyroxine you need (if any)  I will also re check potassium and sodium  Your potassium level is high-so stop your potassium pill  Your sodium level is a little low - do not drink excess water  Instead of tylenol pm to sleep- try benadryl 25 mg at bedtime  Also talk to your psychiatrist about trouble sleeping  For depression- continue seeing your psychiatrist - and also get outdoor more and socialize more

## 2016-01-07 NOTE — Assessment & Plan Note (Signed)
More depressed than manic for 3 y  He will disc this and insomnia with his psychiatrist  Adv d/c tylenol pm due to elevated lft

## 2016-01-07 NOTE — Progress Notes (Signed)
Pre visit review using our clinic review tool, if applicable. No additional management support is needed unless otherwise documented below in the visit note. 

## 2016-01-07 NOTE — Assessment & Plan Note (Signed)
Rev hearing screen  Pt not ready for audiology eval yet

## 2016-01-07 NOTE — Assessment & Plan Note (Signed)
Disc goals for lipids and reasons to control them Rev labs with pt Rev low sat fat diet in detail Expect HDL to rise when he can exercise again

## 2016-01-07 NOTE — Assessment & Plan Note (Signed)
tsh is low and pulse is high (in setting of chronic a fib) inst to stop the nature thyroid  Re check tsh 1 mo  Then if needed will start levothyroxine

## 2016-01-07 NOTE — Assessment & Plan Note (Signed)
No c/o Enc regular cardiac f/u

## 2016-01-07 NOTE — Assessment & Plan Note (Signed)
Doing well except for one small area of incision that is not closed  Will continue ortho f/u for that

## 2016-01-07 NOTE — Assessment & Plan Note (Signed)
Reviewed health habits including diet and exercise and skin cancer prevention Reviewed appropriate screening tests for age  Also reviewed health mt list, fam hx and immunization status , as well as social and family history   See HPI Rev AMW-pt does not want further eval of hearing at this time Declines immunizations- noted  Enc psychiatric f/u for depressoin

## 2016-01-07 NOTE — Assessment & Plan Note (Signed)
Lab Results  Component Value Date   PSA 1.11 12/31/2015   PSA 0.57 07/13/2011   PSA 0.43 07/31/2010   No change in urinary habits  Continue to follow

## 2016-01-07 NOTE — Assessment & Plan Note (Signed)
Pulse is up  Will stop his nature thyroid  Re check 1 mo  Continue cardiac f/u

## 2016-01-09 DIAGNOSIS — M25562 Pain in left knee: Secondary | ICD-10-CM | POA: Diagnosis not present

## 2016-01-12 DIAGNOSIS — M25562 Pain in left knee: Secondary | ICD-10-CM | POA: Diagnosis not present

## 2016-01-14 DIAGNOSIS — I482 Chronic atrial fibrillation: Secondary | ICD-10-CM | POA: Diagnosis not present

## 2016-01-14 DIAGNOSIS — I5022 Chronic systolic (congestive) heart failure: Secondary | ICD-10-CM | POA: Diagnosis not present

## 2016-01-14 DIAGNOSIS — M25562 Pain in left knee: Secondary | ICD-10-CM | POA: Diagnosis not present

## 2016-01-14 DIAGNOSIS — I340 Nonrheumatic mitral (valve) insufficiency: Secondary | ICD-10-CM | POA: Diagnosis not present

## 2016-01-14 DIAGNOSIS — R0602 Shortness of breath: Secondary | ICD-10-CM | POA: Diagnosis not present

## 2016-01-15 DIAGNOSIS — I482 Chronic atrial fibrillation: Secondary | ICD-10-CM | POA: Diagnosis not present

## 2016-01-15 DIAGNOSIS — Z96652 Presence of left artificial knee joint: Secondary | ICD-10-CM | POA: Diagnosis not present

## 2016-01-19 DIAGNOSIS — M25562 Pain in left knee: Secondary | ICD-10-CM | POA: Diagnosis not present

## 2016-01-21 ENCOUNTER — Ambulatory Visit
Admission: RE | Admit: 2016-01-21 | Discharge: 2016-01-21 | Disposition: A | Discharge status: Home / Self Care | Payer: PPO | Source: Ambulatory Visit | Attending: Student | Admitting: Student

## 2016-01-21 DIAGNOSIS — E279 Disorder of adrenal gland, unspecified: Secondary | ICD-10-CM | POA: Insufficient documentation

## 2016-01-21 DIAGNOSIS — M48061 Spinal stenosis, lumbar region without neurogenic claudication: Secondary | ICD-10-CM | POA: Insufficient documentation

## 2016-01-21 DIAGNOSIS — M47816 Spondylosis without myelopathy or radiculopathy, lumbar region: Secondary | ICD-10-CM

## 2016-01-21 DIAGNOSIS — M545 Low back pain: Secondary | ICD-10-CM | POA: Diagnosis not present

## 2016-01-21 DIAGNOSIS — M1288 Other specific arthropathies, not elsewhere classified, other specified site: Secondary | ICD-10-CM | POA: Insufficient documentation

## 2016-01-21 DIAGNOSIS — M5126 Other intervertebral disc displacement, lumbar region: Secondary | ICD-10-CM | POA: Diagnosis not present

## 2016-01-22 DIAGNOSIS — Z1211 Encounter for screening for malignant neoplasm of colon: Secondary | ICD-10-CM | POA: Diagnosis not present

## 2016-01-22 DIAGNOSIS — Z1212 Encounter for screening for malignant neoplasm of rectum: Secondary | ICD-10-CM | POA: Diagnosis not present

## 2016-01-23 DIAGNOSIS — M25562 Pain in left knee: Secondary | ICD-10-CM | POA: Diagnosis not present

## 2016-01-26 DIAGNOSIS — I482 Chronic atrial fibrillation: Secondary | ICD-10-CM | POA: Diagnosis not present

## 2016-01-27 ENCOUNTER — Telehealth: Payer: Self-pay | Admitting: Family Medicine

## 2016-01-27 NOTE — Telephone Encounter (Signed)
This may be related to his bipolar - it sounds like insomnia - I would have him talk to his psychiatrist first regarding treatment -not sure a sleep study would be helpful

## 2016-01-27 NOTE — Telephone Encounter (Signed)
Left voicemail requesting pt to call the office back 

## 2016-01-27 NOTE — Telephone Encounter (Signed)
What symptoms is he having?   Breathing problems? Snoring? Unrestful sleep? Restless legs? Trouble falling asleep or staying asleep?

## 2016-01-27 NOTE — Telephone Encounter (Signed)
Pt called. Request referral for sleep study. He is having trouble sleeping.  Wants to go to [place]. Please advise

## 2016-01-27 NOTE — Telephone Encounter (Signed)
Spoke with pt and he is having both trouble falling a sleep and also staying a sleep, pt said sometimes he doesn't go to sleep until 5am and then will only sleep a few hrs before waking back up and still feels fatigue. Pt doesn't think he has any breathing problems and no problems with restless legs

## 2016-01-28 NOTE — Telephone Encounter (Signed)
Pt notified of Dr. Marliss Coots comments and verbalized understanding, he will check with his psychiatrist and keep [place] updated

## 2016-01-28 NOTE — Telephone Encounter (Signed)
Left message with wife requesting pt to call office back (i didn't see a DPR to speak with wife)

## 2016-01-29 DIAGNOSIS — R2681 Unsteadiness on feet: Secondary | ICD-10-CM | POA: Diagnosis not present

## 2016-01-29 DIAGNOSIS — R42 Dizziness and giddiness: Secondary | ICD-10-CM | POA: Insufficient documentation

## 2016-01-29 DIAGNOSIS — I482 Chronic atrial fibrillation: Secondary | ICD-10-CM | POA: Diagnosis not present

## 2016-01-29 DIAGNOSIS — I5022 Chronic systolic (congestive) heart failure: Secondary | ICD-10-CM | POA: Diagnosis not present

## 2016-01-29 LAB — COLOGUARD: COLOGUARD: NEGATIVE

## 2016-02-06 ENCOUNTER — Other Ambulatory Visit (INDEPENDENT_AMBULATORY_CARE_PROVIDER_SITE_OTHER): Payer: PPO

## 2016-02-06 DIAGNOSIS — Z0000 Encounter for general adult medical examination without abnormal findings: Secondary | ICD-10-CM

## 2016-02-06 DIAGNOSIS — Z125 Encounter for screening for malignant neoplasm of prostate: Secondary | ICD-10-CM | POA: Diagnosis not present

## 2016-02-06 LAB — CBC WITH DIFFERENTIAL/PLATELET
BASOS PCT: 0.4 % (ref 0.0–3.0)
Basophils Absolute: 0 10*3/uL (ref 0.0–0.1)
EOS PCT: 1.4 % (ref 0.0–5.0)
Eosinophils Absolute: 0.1 10*3/uL (ref 0.0–0.7)
HEMATOCRIT: 43.3 % (ref 39.0–52.0)
Hemoglobin: 14.4 g/dL (ref 13.0–17.0)
LYMPHS ABS: 1.8 10*3/uL (ref 0.7–4.0)
Lymphocytes Relative: 27.9 % (ref 12.0–46.0)
MCHC: 33.3 g/dL (ref 30.0–36.0)
MCV: 101.3 fl — AB (ref 78.0–100.0)
MONOS PCT: 7.6 % (ref 3.0–12.0)
Monocytes Absolute: 0.5 10*3/uL (ref 0.1–1.0)
NEUTROS ABS: 4.1 10*3/uL (ref 1.4–7.7)
NEUTROS PCT: 62.7 % (ref 43.0–77.0)
PLATELETS: 167 10*3/uL (ref 150.0–400.0)
RBC: 4.27 Mil/uL (ref 4.22–5.81)
RDW: 17.2 % — AB (ref 11.5–15.5)
WBC: 6.6 10*3/uL (ref 4.0–10.5)

## 2016-02-06 LAB — LIPID PANEL
CHOL/HDL RATIO: 4
Cholesterol: 171 mg/dL (ref 0–200)
HDL: 46.4 mg/dL (ref >39.00)
LDL Cholesterol: 106 mg/dL — ABNORMAL HIGH (ref 0–99)
NONHDL: 124.67
TRIGLYCERIDES: 95 mg/dL (ref 0.0–149.0)
VLDL: 19 mg/dL (ref 0.0–40.0)

## 2016-02-06 LAB — COMPREHENSIVE METABOLIC PANEL
ALT: 23 U/L (ref 0–53)
AST: 35 U/L (ref 0–37)
Albumin: 3.8 g/dL (ref 3.5–5.2)
Alkaline Phosphatase: 132 U/L — ABNORMAL HIGH (ref 39–117)
BUN: 23 mg/dL (ref 6–23)
CALCIUM: 9.2 mg/dL (ref 8.4–10.5)
CO2: 28 meq/L (ref 19–32)
Chloride: 102 mEq/L (ref 96–112)
Creatinine, Ser: 0.99 mg/dL (ref 0.40–1.50)
GFR: 76.65 mL/min (ref >60.00)
GLUCOSE: 117 mg/dL — AB (ref 70–99)
POTASSIUM: 4.8 meq/L (ref 3.5–5.1)
Sodium: 137 mEq/L (ref 135–145)
Total Bilirubin: 1.1 mg/dL (ref 0.2–1.2)
Total Protein: 7.4 g/dL (ref 6.0–8.3)

## 2016-02-08 ENCOUNTER — Telehealth: Payer: Self-pay | Admitting: Family Medicine

## 2016-02-08 DIAGNOSIS — R1909 Other intra-abdominal and pelvic swelling, mass and lump: Secondary | ICD-10-CM

## 2016-02-08 NOTE — Telephone Encounter (Signed)
Please let pt know that I reviewed his MRI of the spine and it incidentally saw a mass in the kidney area  Not sure if this is significant but the radiologist recommended further imaging (CT or MRI- likely a CT)- is he ok for [place] to set that up? If so does he have a pref for [employer] or [employer] ?  Thanks

## 2016-02-09 DIAGNOSIS — R1900 Intra-abdominal and pelvic swelling, mass and lump, unspecified site: Secondary | ICD-10-CM | POA: Insufficient documentation

## 2016-02-09 LAB — TSH: TSH: 1.29 u[IU]/mL (ref 0.35–4.50)

## 2016-02-09 LAB — PSA: PSA: 1.9 ng/mL (ref 0.10–4.00)

## 2016-02-09 NOTE — Telephone Encounter (Signed)
Left voicemail requesting pt to call the office back 

## 2016-02-09 NOTE — Telephone Encounter (Signed)
I put the order in  Will route to [facility]

## 2016-02-09 NOTE — Telephone Encounter (Signed)
Pt notified of MRI results and Dr. Marliss Coots comments. Pt agrees to get CT, please put referral in and he would like to go somewhere in [place], I advise pt our [facility] will call to schedule imaging

## 2016-02-11 ENCOUNTER — Telehealth: Payer: Self-pay | Admitting: Family Medicine

## 2016-02-11 ENCOUNTER — Encounter: Payer: Self-pay | Admitting: Psychiatry

## 2016-02-11 ENCOUNTER — Ambulatory Visit (INDEPENDENT_AMBULATORY_CARE_PROVIDER_SITE_OTHER): Payer: PPO | Admitting: Psychiatry

## 2016-02-11 VITALS — BP 119/87 | HR 112 | Temp 97.6°F | Wt 160.6 lb

## 2016-02-11 DIAGNOSIS — F330 Major depressive disorder, recurrent, mild: Secondary | ICD-10-CM | POA: Diagnosis not present

## 2016-02-11 DIAGNOSIS — F411 Generalized anxiety disorder: Secondary | ICD-10-CM

## 2016-02-11 MED ORDER — DULOXETINE HCL 30 MG PO CPEP: 30.0000 mg | ORAL_CAPSULE | ORAL | 1 refills | Status: DC

## 2016-02-11 MED ORDER — ESCITALOPRAM OXALATE 10 MG PO TABS: 10.0000 mg | ORAL_TABLET | ORAL | 1 refills | Status: DC

## 2016-02-11 NOTE — Telephone Encounter (Signed)
Addressed through result notes  

## 2016-02-11 NOTE — Telephone Encounter (Signed)
CT Scheduled and patient notified.

## 2016-02-11 NOTE — Telephone Encounter (Signed)
Patient returned Shapale's call.  He wasn't sure what he was calling back about because he has discussed several things with Shapale.

## 2016-02-11 NOTE — Progress Notes (Signed)
BH MD/PA/NP OP Progress Note  02/11/2016 1:44 PM David Duncan  MRN:  [id number]  Subjective:  Patient is a 82 year old male who presented for the follow-up. He reported that he has been feeling anxious and depressed at this time. However he was unable to explain team. He reported that he has been diagnosed with a kidney mass and is going to have a CT scan done next week. He appeared somewhat apprehensive during the interview. The name of Cymbalta and reported that he wants to be started on the medication. He reported that the Wellbutrin is not helping him. He also has memory problems and reported that he has to write down everything. He is not interested in taking medications for memory issues at this time. Patient reported that he drives himself and currently lives with his wife. He has been driving for a long period of time. He continues to have slow speech. He denied having any suicidal ideations or plans. He denied having any perceptual disturbances. He has been compliant with his medications.    He spends time reading books from [employer] and his wife is also supportive and reported enjoying reading books. He stated that they only use television for watching videos. They do not have any cable and they do not watch any news. He has a good healthy lifestyle.    Chief Complaint:  Chief Complaint    Follow-up; Medication Refill     Visit Diagnosis:     ICD-9-CM ICD-10-CM   1. GAD (generalized anxiety disorder) 300.02 F41.1   2. MDD (major depressive disorder), recurrent episode, mild (HCC) 296.31 F33.0     Past Medical History:  Past Medical History:  Diagnosis Date  . Anxiety   . Atrial fibrillation ([place])   . Basal cell cancer 2008   on shoulder  . Bipolar disorder ([place])    hospitalized in past  . CVA (cerebral vascular accident) ([place]) 8/11  . Generalized anxiety disorder   . GERD (gastroesophageal reflux disease)   . History of kidney stones    30 years ago  . Macular  degeneration   . Osteoarthritis    in neck and left knee  . Tremor    noted when writing    Past Surgical History:  Procedure Laterality Date  . BACK SURGERY  1991  . CATARACT EXTRACTION W/ INTRAOCULAR LENS IMPLANT Bilateral   . KNEE ARTHROPLASTY Left 12/03/2015   Procedure: COMPUTER ASSISTED TOTAL KNEE ARTHROPLASTY;  Surgeon: Dereck Leep, MD;  Location: [facility] ORS;  Service: Orthopedics;  Laterality: Left;  . SKIN CANCER EXCISION     Family History:  Family History  Problem Relation Age of Onset  . Stroke Father   . Cancer Brother     prostate  . Coronary artery disease Brother   . Hypertension Brother    Social History:  Social History   Social History  . Marital status: Married    Spouse name: N/A  . Number of children: N/A  . Years of education: N/A   Occupational History  . retired     used to work on farm, Press photographer, Cabin crew   Social History Main Topics  . Smoking status: Never Smoker  . Smokeless tobacco: Never Used  . Alcohol use No  . Drug use: No  . Sexual activity: No   Other Topics Concern  . None   Social History Narrative   Moved from [place] 3/10   Walks 30-40 minutes daily   Additional History:  Assessment:    Musculoskeletal: Strength & Muscle Tone: within normal limits Gait & Station: normal Patient leans: N/A  Psychiatric Specialty Exam: Medication Refill   Anxiety  Patient reports no insomnia, nervous/anxious behavior or suicidal ideas.    Depression         Associated symptoms include does not have insomnia and no suicidal ideas.  Past medical history includes anxiety.     Review of Systems  Psychiatric/Behavioral: Negative for depression, hallucinations, memory loss, substance abuse and suicidal ideas. The patient is not nervous/anxious and does not have insomnia.   All other systems reviewed and are negative.   Blood pressure 119/87, pulse (!) 112, temperature 97.6 F (36.4 C), temperature source Oral, weight 160 lb  9.6 oz (72.8 kg).Body mass index is 25.92 kg/m.  General Appearance: Well Groomed and slow gait  Eye Contact:  Good  Speech:  Normal Rate and Slow  Volume:  Normal  Mood:  Good  Affect:  Appropriate and Full Range  Thought Process:  Linear and Logical  Orientation:  Full (Time, Place, and Person)  Thought Content:  Negative  Suicidal Thoughts:  No  Homicidal Thoughts:  No  Memory:  Immediate;   Good Recent;   Good Remote;   Good  Judgement:  Good  Insight:  Good  Psychomotor Activity:  Normal  Concentration:  Good  Recall:  Good  Fund of Knowledge: Good  Language: Good  Akathisia:  Negative  Handed:  Right unknown  AIMS (if indicated):  No done  Assets:  Communication Skills Desire for Improvement  ADL's:  Intact  Cognition: WNL  Sleep:  good   Is the patient at risk to self?  No. Has the patient been a risk to self in the past 6 months?  No. Has the patient been a risk to self within the distant past?  No. Is the patient a risk to others?  No. Has the patient been a risk to others in the past 6 months?  No. Has the patient been a risk to others within the distant past?  No.  Current Medications: Current Outpatient Prescriptions  Medication Sig Dispense Refill  . Ascorbic Acid (VITAMIN C) 500 MG tablet Take 500 mg by mouth 2 (two) times daily.     . B Complex Vitamins (B COMPLEX PO) Take by mouth 2 (two) times daily.     . Cholecalciferol (VITAMIN D-3) 5000 UNITS TABS Take 1 tablet by mouth daily.     . Cyanocobalamin (VITAMIN B-12) 2500 MCG SUBL Take 2 tabs by mouth daily     . escitalopram (LEXAPRO) 10 MG tablet Take 1 tablet (10 mg total) by mouth every morning. Please dispense 10mg  qam #90 90 tablet 1  . Lutein 6 MG CAPS Take 6 mg by mouth daily.     . metoprolol tartrate (LOPRESSOR) 25 MG tablet Take 25 mg by mouth 2 (two) times daily.     [name] Kitchen pyridOXINE (VITAMIN B-6) 25 MG tablet Take 25 mg by mouth 2 (two) times daily.     [name] Kitchen warfarin (COUMADIN) 2.5 MG tablet  Take 2.5 mg by mouth daily at 6 PM. Take 1 tablet daily 5 days a week and on the 6th day take 2 tablets    . warfarin (COUMADIN) 5 MG tablet Use as directed by anticoagulation clinic (Patient taking differently: Use as directed by anticoagulation clinic every 6th day) 90 tablet 1  . DULoxetine (CYMBALTA) 30 MG capsule Take 1 capsule (30 mg total) by mouth every  morning. 30 capsule 1   No current facility-administered medications for this visit.     Medical Decision Making:  Established Problem, Stable/Improving (1)  Treatment Plan Summary:Medication management We discussed at length about the medications treatment risks benefits and alternatives. I advised patient that he needs to evaluate his over-the-counter medications and he demonstrated understanding.  Continue Lexapro 10 mg daily. I will prescribe him 3 month supply of the medications. Discontinue Wellbutrin  I will start him on Cymbalta 30 mg daily. Discussed with him about the side effects of medications and he agreed with the plan.   Follow-up in 2 weeks or earlier depending on his symptoms     More than 50% of the time spent in psychoeducation, counseling and coordination of care.    This note was generated in part or whole with voice recognition software. Voice regonition is usually quite accurate but there are transcription errors that can and very often do occur. I apologize for any typographical errors that were not detected and corrected.     Rainey Pines, MD  02/11/2016, 1:44 PM

## 2016-02-17 ENCOUNTER — Ambulatory Visit
Admission: RE | Admit: 2016-02-17 | Discharge: 2016-02-17 | Disposition: A | Discharge status: Home / Self Care | Payer: PPO | Source: Ambulatory Visit | Attending: Family Medicine | Admitting: Family Medicine

## 2016-02-17 ENCOUNTER — Telehealth: Payer: Self-pay

## 2016-02-17 DIAGNOSIS — N281 Cyst of kidney, acquired: Secondary | ICD-10-CM | POA: Diagnosis not present

## 2016-02-17 DIAGNOSIS — J439 Emphysema, unspecified: Secondary | ICD-10-CM | POA: Diagnosis not present

## 2016-02-17 DIAGNOSIS — I700 Atherosclerosis of aorta: Secondary | ICD-10-CM | POA: Insufficient documentation

## 2016-02-17 DIAGNOSIS — N401 Enlarged prostate with lower urinary tract symptoms: Secondary | ICD-10-CM

## 2016-02-17 DIAGNOSIS — R1909 Other intra-abdominal and pelvic swelling, mass and lump: Secondary | ICD-10-CM | POA: Diagnosis not present

## 2016-02-17 DIAGNOSIS — I517 Cardiomegaly: Secondary | ICD-10-CM | POA: Insufficient documentation

## 2016-02-17 DIAGNOSIS — R351 Nocturia: Principal | ICD-10-CM

## 2016-02-17 HISTORY — DX: Malignant melanoma of skin, unspecified: C43.9

## 2016-02-17 MED ORDER — TAMSULOSIN HCL 0.4 MG PO CAPS: 0.4000 mg | ORAL_CAPSULE | Freq: Every day | ORAL | 11 refills | Status: DC

## 2016-02-17 MED ORDER — IOPAMIDOL (ISOVUE-370) INJECTION 76%
100.0000 mL | Freq: Once | INTRAVENOUS | Status: AC | PRN
  Administered 2016-02-17: 100 mL via INTRAVENOUS

## 2016-02-17 NOTE — Telephone Encounter (Signed)
Patient returned Shapale's call.  Patient can be reached at [phone].

## 2016-02-17 NOTE — Telephone Encounter (Signed)
Left voicemail requesting pt to call the office back 

## 2016-02-17 NOTE — Telephone Encounter (Signed)
Pt said at his CPE that you all discussed a medication that will help him with his prostate issues. Pt said her has urinary frequency and you told him you could prescribed a medication to help, pt uses [facility] [facility]

## 2016-02-17 NOTE — Telephone Encounter (Signed)
Pt notified Rx sent and advise of Dr. Marliss Coots comments and advise of the side eff and pt verbalized understanding

## 2016-02-17 NOTE — Telephone Encounter (Signed)
Pt is requesting a call back regarding a PSA medication.

## 2016-02-17 NOTE — Telephone Encounter (Signed)
We can try flomax low dose  This should help him empty so he will not have to urinate as often Potential side effect is lower bp so if he becomes dizzy stop it and let me know  [place]

## 2016-02-18 DIAGNOSIS — I482 Chronic atrial fibrillation: Secondary | ICD-10-CM | POA: Diagnosis not present

## 2016-02-19 DIAGNOSIS — M5136 Other intervertebral disc degeneration, lumbar region: Secondary | ICD-10-CM | POA: Diagnosis not present

## 2016-02-19 DIAGNOSIS — M5416 Radiculopathy, lumbar region: Secondary | ICD-10-CM | POA: Diagnosis not present

## 2016-02-19 DIAGNOSIS — M48062 Spinal stenosis, lumbar region with neurogenic claudication: Secondary | ICD-10-CM | POA: Diagnosis not present

## 2016-02-26 ENCOUNTER — Ambulatory Visit (INDEPENDENT_AMBULATORY_CARE_PROVIDER_SITE_OTHER): Payer: PPO | Admitting: Psychiatry

## 2016-02-26 ENCOUNTER — Encounter: Payer: Self-pay | Admitting: Psychiatry

## 2016-02-26 VITALS — BP 117/79 | HR 64 | Temp 97.6°F | Resp 15 | Wt 163.8 lb

## 2016-02-26 DIAGNOSIS — F330 Major depressive disorder, recurrent, mild: Secondary | ICD-10-CM

## 2016-02-26 DIAGNOSIS — F411 Generalized anxiety disorder: Secondary | ICD-10-CM

## 2016-02-26 MED ORDER — ESCITALOPRAM OXALATE 5 MG PO TABS: 5.0000 mg | ORAL_TABLET | ORAL | 0 refills | Status: DC

## 2016-02-26 MED ORDER — DULOXETINE HCL 30 MG PO CPEP: 30.0000 mg | ORAL_CAPSULE | Freq: Two times a day (BID) | ORAL | 1 refills | Status: DC

## 2016-02-26 NOTE — Progress Notes (Signed)
BH MD/PA/NP OP Progress Note  02/26/2016 2:52 PM DONNELLY LEGALL  MRN:  [id number]  Subjective:  Patient is a 82 year old male who presented for the follow-up. He reported that he is feeling tired and wants to sleep. He reported that he does not do much activity during the day. His wife is very active and she is involved in different activities. He reported that he went to his physician who has advised him to increase her physical activity. [facility] Patient reported that he has noticed improvement in his symptoms with the help of Cymbalta. He wants to increase the dose. He is interested in decreasing the dose of Lexapro at this time. He appeared calm and collective during the interview. She and reported that he is trying to go to [place] after the Thanksgiving to attend the wedding of his nephew. He drives with his wife.   He spends time reading books from [employer] and his wife is also supportive and reported enjoying reading books. He stated that they only use television for watching videos. They do not have any cable and they do not watch any news. He has a good healthy lifestyle.    Chief Complaint:  Chief Complaint    Follow-up     Visit Diagnosis:     ICD-9-CM ICD-10-CM   1. MDD (major depressive disorder), recurrent episode, mild (HCC) 296.31 F33.0   2. GAD (generalized anxiety disorder) 300.02 F41.1     Past Medical History:  Past Medical History:  Diagnosis Date  . Anxiety   . Atrial fibrillation ([place])   . Basal cell cancer 2008   on shoulder  . Bipolar disorder ([place])    hospitalized in past  . CVA (cerebral vascular accident) ([place]) 8/11  . Generalized anxiety disorder   . GERD (gastroesophageal reflux disease)   . History of kidney stones    30 years ago  . Macular degeneration   . Melanoma ([place])    resected from scalp approximately 1 year ago.   . Osteoarthritis    in neck and left knee  . Tremor    noted when writing     Past Surgical History:  Procedure Laterality Date  . BACK SURGERY  1991  . CATARACT EXTRACTION W/ INTRAOCULAR LENS IMPLANT Bilateral   . KNEE ARTHROPLASTY Left 12/03/2015   Procedure: COMPUTER ASSISTED TOTAL KNEE ARTHROPLASTY;  Surgeon: Dereck Leep, MD;  Location: [facility] ORS;  Service: Orthopedics;  Laterality: Left;  . SKIN CANCER EXCISION     Family History:  Family History  Problem Relation Age of Onset  . Stroke Father   . Cancer Brother     prostate  . Coronary artery disease Brother   . Hypertension Brother    Social History:  Social History   Social History  . Marital status: Married    Spouse name: N/A  . Number of children: N/A  . Years of education: N/A   Occupational History  . retired     used to work on farm, Press photographer, Cabin crew   Social History Main Topics  . Smoking status: Never Smoker  . Smokeless tobacco: Never Used  . Alcohol use No  . Drug use: No  . Sexual activity: No   Other Topics Concern  . None   Social History Narrative   Moved from [place] 3/10   Walks 30-40 minutes daily   Additional History:   Assessment:    Musculoskeletal: Strength &  Muscle Tone: within normal limits Gait & Station: normal Patient leans: N/A  Psychiatric Specialty Exam: Medication Refill   Anxiety  Patient reports no insomnia, nervous/anxious behavior or suicidal ideas.    Depression         Associated symptoms include does not have insomnia and no suicidal ideas.  Past medical history includes anxiety.     Review of Systems  Psychiatric/Behavioral: Negative for depression, hallucinations, memory loss, substance abuse and suicidal ideas. The patient is not nervous/anxious and does not have insomnia.   All other systems reviewed and are negative.   Blood pressure 117/79, pulse 64, temperature 97.6 F (36.4 C), temperature source Tympanic, resp. rate 15, weight 163 lb 12.8 oz (74.3 kg), SpO2 94 %.Body mass index is 26.44 kg/m.  General  Appearance: Well Groomed and slow gait  Eye Contact:  Good  Speech:  Normal Rate and Slow  Volume:  Normal  Mood:  Good  Affect:  Appropriate and Full Range  Thought Process:  Linear and Logical  Orientation:  Full (Time, Place, and Person)  Thought Content:  Negative  Suicidal Thoughts:  No  Homicidal Thoughts:  No  Memory:  Immediate;   Good Recent;   Good Remote;   Good  Judgement:  Good  Insight:  Good  Psychomotor Activity:  Normal  Concentration:  Good  Recall:  Good  Fund of Knowledge: Good  Language: Good  Akathisia:  Negative  Handed:  Right unknown  AIMS (if indicated):  No done  Assets:  Communication Skills Desire for Improvement  ADL's:  Intact  Cognition: WNL  Sleep:  good   Is the patient at risk to self?  No. Has the patient been a risk to self in the past 6 months?  No. Has the patient been a risk to self within the distant past?  No. Is the patient a risk to others?  No. Has the patient been a risk to others in the past 6 months?  No. Has the patient been a risk to others within the distant past?  No.  Current Medications: Current Outpatient Prescriptions  Medication Sig Dispense Refill  . Ascorbic Acid (VITAMIN C) 500 MG tablet Take 500 mg by mouth 2 (two) times daily.     . B Complex Vitamins (B COMPLEX PO) Take by mouth 2 (two) times daily.     . Cholecalciferol (VITAMIN D-3) 5000 UNITS TABS Take 1 tablet by mouth daily.     . Cyanocobalamin (VITAMIN B-12) 2500 MCG SUBL Take 2 tabs by mouth daily     . DULoxetine (CYMBALTA) 30 MG capsule Take 1 capsule (30 mg total) by mouth 2 (two) times daily. 60 capsule 1  . escitalopram (LEXAPRO) 5 MG tablet Take 1 tablet (5 mg total) by mouth every morning. 5mg  daily x 10 day- pt has supply 90 tablet 0  . Lutein 6 MG CAPS Take 6 mg by mouth daily.     . metoprolol tartrate (LOPRESSOR) 25 MG tablet Take 25 mg by mouth 2 (two) times daily.     [name] Kitchen pyridOXINE (VITAMIN B-6) 25 MG tablet Take 25 mg by mouth 2 (two)  times daily.     . tamsulosin (FLOMAX) 0.4 MG CAPS capsule Take 1 capsule (0.4 mg total) by mouth daily. 30 capsule 11  . warfarin (COUMADIN) 2.5 MG tablet Take 2.5 mg by mouth daily at 6 PM. Take 1 tablet daily 5 days a week and on the 6th day take 2 tablets    .  warfarin (COUMADIN) 5 MG tablet Use as directed by anticoagulation clinic (Patient taking differently: Use as directed by anticoagulation clinic every 6th day) 90 tablet 1   No current facility-administered medications for this visit.     Medical Decision Making:  Established Problem, Stable/Improving (1)  Treatment Plan Summary:Medication management We discussed at length about the medications treatment risks benefits and alternatives. I advised patient that he needs to evaluate his over-the-counter medications and he demonstrated understanding.  Continue Lexapro 5  mg daily.For 10 days and then stop. He agreed with the plan I will start him on Cymbalta 30 mg bid . Discussed with him about the side effects of medications and he agreed with the plan.   Follow-up in 4  weeks or earlier depending on his symptoms     More than 50% of the time spent in psychoeducation, counseling and coordination of care.    This note was generated in part or whole with voice recognition software. Voice regonition is usually quite accurate but there are transcription errors that can and very often do occur. I apologize for any typographical errors that were not detected and corrected.     Rainey Pines, MD  02/26/2016, 2:52 PM

## 2016-03-02 ENCOUNTER — Encounter: Payer: Self-pay | Admitting: Family Medicine

## 2016-03-02 DIAGNOSIS — H353132 Nonexudative age-related macular degeneration, bilateral, intermediate dry stage: Secondary | ICD-10-CM | POA: Diagnosis not present

## 2016-03-02 DIAGNOSIS — Z9841 Cataract extraction status, right eye: Secondary | ICD-10-CM | POA: Diagnosis not present

## 2016-03-02 DIAGNOSIS — Z9842 Cataract extraction status, left eye: Secondary | ICD-10-CM | POA: Diagnosis not present

## 2016-03-19 DIAGNOSIS — M25511 Pain in right shoulder: Secondary | ICD-10-CM | POA: Diagnosis not present

## 2016-03-19 DIAGNOSIS — M7581 Other shoulder lesions, right shoulder: Secondary | ICD-10-CM | POA: Diagnosis not present

## 2016-03-19 DIAGNOSIS — Z7901 Long term (current) use of anticoagulants: Secondary | ICD-10-CM | POA: Diagnosis not present

## 2016-03-19 DIAGNOSIS — G8929 Other chronic pain: Secondary | ICD-10-CM | POA: Diagnosis not present

## 2016-03-19 DIAGNOSIS — M25512 Pain in left shoulder: Secondary | ICD-10-CM | POA: Diagnosis not present

## 2016-03-19 DIAGNOSIS — M75112 Incomplete rotator cuff tear or rupture of left shoulder, not specified as traumatic: Secondary | ICD-10-CM | POA: Diagnosis not present

## 2016-03-24 ENCOUNTER — Encounter: Payer: Self-pay | Admitting: Psychiatry

## 2016-03-24 ENCOUNTER — Ambulatory Visit (INDEPENDENT_AMBULATORY_CARE_PROVIDER_SITE_OTHER): Payer: PPO | Admitting: Psychiatry

## 2016-03-24 VITALS — BP 138/88 | HR 82 | Wt 166.0 lb

## 2016-03-24 DIAGNOSIS — F330 Major depressive disorder, recurrent, mild: Secondary | ICD-10-CM | POA: Diagnosis not present

## 2016-03-24 DIAGNOSIS — F411 Generalized anxiety disorder: Secondary | ICD-10-CM

## 2016-03-24 MED ORDER — ESCITALOPRAM OXALATE 10 MG PO TABS: 10.0000 mg | ORAL_TABLET | ORAL | 0 refills | Status: DC

## 2016-03-24 MED ORDER — DEPLIN 15 15-90.314 MG PO CAPS: 15.0000 mg | ORAL_CAPSULE | ORAL | 0 refills | Status: DC

## 2016-03-24 NOTE — Progress Notes (Signed)
BH MD/PA/NP OP Progress Note  03/24/2016 2:11 PM David Duncan  MRN:  [id number]  Subjective:  Patient is a 82 year old male who presented for the follow-up. He reported that he is feeling depressed and is having a difficult time waking up in the morning. He reported that he did not get a higher dose of Cymbalta from his pharmacy. However he reported that he tried taking a higher dose at home and then he started noticing petechiae on his hands. He did not take the higher dose. He reported that he went to vacation with his wife and forgot to take his medications. He came back after 2 days and started taking Lexapro 10 mg. He is feeling better now. He does not want to take the Cymbalta at this time. We discussed about adding Deplin at this time and he agreed with the plan. He remains soft-spoken. He does not have any suicidal ideations or plans.  His wife is very active and she is involved in different activities. He reported that he went to his physician who has advised him to increase her physical activity. [place] [facility] [place].He appeared calm and collective during the interview.  He spends time reading books from [employer] and his wife is also supportive and reported enjoying reading books. He stated that they only use television for watching videos. They do not have any cable and they do not watch any news. He has a good healthy lifestyle.    Chief Complaint:  Chief Complaint    Follow-up; Medication Refill     Visit Diagnosis:   No diagnosis found.  Past Medical History:  Past Medical History:  Diagnosis Date  . Anxiety   . Atrial fibrillation ([place])   . Basal cell cancer 2008   on shoulder  . Bipolar disorder ([place])    hospitalized in past  . CVA (cerebral vascular accident) ([place]) 8/11  . Generalized anxiety disorder   . GERD (gastroesophageal reflux disease)   . History of kidney stones    30 years ago  . Macular degeneration   . Melanoma ([place])    resected from scalp approximately 1 year ago.   . Osteoarthritis    in neck and left knee  . Tremor    noted when writing    Past Surgical History:  Procedure Laterality Date  . BACK SURGERY  1991  . CATARACT EXTRACTION W/ INTRAOCULAR LENS IMPLANT Bilateral   . KNEE ARTHROPLASTY Left 12/03/2015   Procedure: COMPUTER ASSISTED TOTAL KNEE ARTHROPLASTY;  Surgeon: Dereck Leep, MD;  Location: [facility] ORS;  Service: Orthopedics;  Laterality: Left;  . SKIN CANCER EXCISION     Family History:  Family History  Problem Relation Age of Onset  . Stroke Father   . Cancer Brother     prostate  . Coronary artery disease Brother   . Hypertension Brother    Social History:  Social History   Social History  . Marital status: Married    Spouse name: N/A  . Number of children: N/A  . Years of education: N/A   Occupational History  . retired     used to work on farm, Press photographer, Cabin crew   Social History Main Topics  . Smoking status: Never Smoker  . Smokeless tobacco: Never Used  . Alcohol use No  . Drug use: No  . Sexual activity: No   Other Topics Concern  . None   Social History Narrative  Moved from [place] 3/10   Walks 30-40 minutes daily   Additional History:   Assessment:    Musculoskeletal: Strength & Muscle Tone: within normal limits Gait & Station: normal Patient leans: N/A  Psychiatric Specialty Exam: Medication Refill   Anxiety  Patient reports no insomnia, nervous/anxious behavior or suicidal ideas.    Depression         Associated symptoms include does not have insomnia and no suicidal ideas.  Past medical history includes anxiety.     Review of Systems  Psychiatric/Behavioral: Negative for depression, hallucinations, memory loss, substance abuse and suicidal ideas. The patient is not nervous/anxious and does not have insomnia.   All other systems reviewed and are negative.   There were no vitals taken for this visit.There is no height or weight on  file to calculate BMI.  General Appearance: Well Groomed and slow gait  Eye Contact:  Good  Speech:  Normal Rate and Slow  Volume:  Normal  Mood:  Good  Affect:  Appropriate and Full Range  Thought Process:  Linear and Logical  Orientation:  Full (Time, Place, and Person)  Thought Content:  Negative  Suicidal Thoughts:  No  Homicidal Thoughts:  No  Memory:  Immediate;   Good Recent;   Good Remote;   Good  Judgement:  Good  Insight:  Good  Psychomotor Activity:  Normal  Concentration:  Good  Recall:  Good  Fund of Knowledge: Good  Language: Good  Akathisia:  Negative  Handed:  Right unknown  AIMS (if indicated):  No done  Assets:  Communication Skills Desire for Improvement  ADL's:  Intact  Cognition: WNL  Sleep:  good   Is the patient at risk to self?  No. Has the patient been a risk to self in the past 6 months?  No. Has the patient been a risk to self within the distant past?  No. Is the patient a risk to others?  No. Has the patient been a risk to others in the past 6 months?  No. Has the patient been a risk to others within the distant past?  No.  Current Medications: Current Outpatient Prescriptions  Medication Sig Dispense Refill  . Ascorbic Acid (VITAMIN C) 500 MG tablet Take 500 mg by mouth 2 (two) times daily.     . B Complex Vitamins (B COMPLEX PO) Take by mouth 2 (two) times daily.     . Cholecalciferol (VITAMIN D-3) 5000 UNITS TABS Take 1 tablet by mouth daily.     . Cyanocobalamin (VITAMIN B-12) 2500 MCG SUBL Take 2 tabs by mouth daily     . DULoxetine (CYMBALTA) 30 MG capsule Take 1 capsule (30 mg total) by mouth 2 (two) times daily. 60 capsule 1  . escitalopram (LEXAPRO) 5 MG tablet Take 1 tablet (5 mg total) by mouth every morning. 5mg  daily x 10 day- pt has supply 90 tablet 0  . Lutein 6 MG CAPS Take 6 mg by mouth daily.     . metoprolol tartrate (LOPRESSOR) 25 MG tablet Take 25 mg by mouth 2 (two) times daily.     [name] Kitchen pyridOXINE (VITAMIN B-6) 25 MG  tablet Take 25 mg by mouth 2 (two) times daily.     . tamsulosin (FLOMAX) 0.4 MG CAPS capsule Take 1 capsule (0.4 mg total) by mouth daily. 30 capsule 11  . warfarin (COUMADIN) 2.5 MG tablet Take 2.5 mg by mouth daily at 6 PM. Take 1 tablet daily 5 days a week and  on the 6th day take 2 tablets    . warfarin (COUMADIN) 5 MG tablet Use as directed by anticoagulation clinic (Patient taking differently: Use as directed by anticoagulation clinic every 6th day) 90 tablet 1   No current facility-administered medications for this visit.     Medical Decision Making:  Established Problem, Stable/Improving (1)  Treatment Plan Summary:Medication management We discussed at length about the medications treatment risks benefits and alternatives  Continue Lexapro 10 mg daily. Will start him on Deplin one pill daily and he was given samples for the next 4 weeks. Advised him to call if he notices worsening of his symptoms and he demonstrated understanding.  Follow-up in 4  weeks or earlier depending on his symptoms     More than 50% of the time spent in psychoeducation, counseling and coordination of care.    This note was generated in part or whole with voice recognition software. Voice regonition is usually quite accurate but there are transcription errors that can and very often do occur. I apologize for any typographical errors that were not detected and corrected.     Rainey Pines, MD  03/24/2016, 2:11 PM

## 2016-03-25 ENCOUNTER — Encounter: Payer: Self-pay | Admitting: Internal Medicine

## 2016-03-25 ENCOUNTER — Ambulatory Visit (INDEPENDENT_AMBULATORY_CARE_PROVIDER_SITE_OTHER): Payer: PPO | Admitting: Internal Medicine

## 2016-03-25 VITALS — BP 126/78 | HR 95 | Temp 98.0°F | Wt 165.0 lb

## 2016-03-25 DIAGNOSIS — R269 Unspecified abnormalities of gait and mobility: Secondary | ICD-10-CM | POA: Diagnosis not present

## 2016-03-25 DIAGNOSIS — R2689 Other abnormalities of gait and mobility: Secondary | ICD-10-CM

## 2016-03-25 DIAGNOSIS — Z0289 Encounter for other administrative examinations: Secondary | ICD-10-CM | POA: Diagnosis not present

## 2016-03-25 DIAGNOSIS — Z96652 Presence of left artificial knee joint: Secondary | ICD-10-CM | POA: Diagnosis not present

## 2016-03-25 NOTE — Progress Notes (Signed)
Subjective:    Patient ID: David Duncan, male    DOB: Nov 13, 1930, 82 y.o.   MRN: [id number]  HPI  Pt presents to the clinic today for a mobility assessment and to request a RX for a walker. He feels unsteady on his feet. He has trouble with his balance. He needs help walking long distances. He denies recent falls but reports he has fallen multiple times in his home. He had a CVA in 2011, but he denies residual effect from that. He has osteoarthritis in his knees, s/p left knee arthroplasty 11/2015, which has left him with some weakness and unsteadiness.  Review of Systems      Past Medical History:  Diagnosis Date  . Anxiety   . Atrial fibrillation ([place])   . Basal cell cancer 2008   on shoulder  . Bipolar disorder ([place])    hospitalized in past  . CVA (cerebral vascular accident) ([place]) 8/11  . Generalized anxiety disorder   . GERD (gastroesophageal reflux disease)   . History of kidney stones    30 years ago  . Macular degeneration   . Melanoma ([place])    resected from scalp approximately 1 year ago.   . Osteoarthritis    in neck and left knee  . Tremor    noted when writing    Current Outpatient Prescriptions  Medication Sig Dispense Refill  . Ascorbic Acid (VITAMIN C) 500 MG tablet Take 500 mg by mouth 2 (two) times daily.     . B Complex Vitamins (B COMPLEX PO) Take by mouth 2 (two) times daily.     . Cholecalciferol (VITAMIN D-3) 5000 UNITS TABS Take 1 tablet by mouth daily.     . Cyanocobalamin (VITAMIN B-12) 2500 MCG SUBL Take 2 tabs by mouth daily     . escitalopram (LEXAPRO) 10 MG tablet Take 1 tablet (10 mg total) by mouth every morning. 90 tablet 0  . L-Methylfolate-Algae (DEPLIN 15) 15-90.314 MG CAPS Take 15 mg by mouth every morning. 28 capsule 0  . Lutein 6 MG CAPS Take 6 mg by mouth daily.     . metoprolol tartrate (LOPRESSOR) 25 MG tablet Take 25 mg by mouth 2 (two) times daily.     [name] Kitchen pyridOXINE (VITAMIN B-6) 25 MG tablet Take 25 mg by mouth 2 (two) times  daily.     . tamsulosin (FLOMAX) 0.4 MG CAPS capsule Take 1 capsule (0.4 mg total) by mouth daily. 30 capsule 11  . warfarin (COUMADIN) 2.5 MG tablet Take 2.5 mg by mouth daily at 6 PM. Take 1 tablet daily 5 days a week and on the 6th day take 2 tablets    . warfarin (COUMADIN) 5 MG tablet Use as directed by anticoagulation clinic (Patient taking differently: Use as directed by anticoagulation clinic every 6th day) 90 tablet 1   No current facility-administered medications for this visit.     No Known Allergies  Family History  Problem Relation Age of Onset  . Stroke Father   . Cancer Brother     prostate  . Coronary artery disease Brother   . Hypertension Brother     Social History   Social History  . Marital status: Married    Spouse name: N/A  . Number of children: N/A  . Years of education: N/A   Occupational History  . retired     used to work on farm, Press photographer, Cabin crew   Social History Main Topics  . Smoking status: Never  Smoker  . Smokeless tobacco: Never Used  . Alcohol use No  . Drug use: No  . Sexual activity: No   Other Topics Concern  . Not on file   Social History Narrative   Moved from [place] 3/10   Walks 30-40 minutes daily     Musculoskeletal: Pt reports difficulty with gait. Denies decrease in range of motion, muscle pain or joint pain and swelling.  Neurological: Pt reports difficulty with balance. Denies dizziness, difficulty with memory, difficulty with speech or problems with coordination.    No other specific complaints in a complete review of systems (except as listed in HPI above).  Objective:   Physical Exam BP 126/78   Pulse 95   Temp 98 F (36.7 C) (Oral)   Wt 165 lb (74.8 kg)   SpO2 98%   BMI 26.63 kg/m  Wt Readings from Last 3 Encounters:  03/25/16 165 lb (74.8 kg)  01/07/16 159 lb 8 oz (72.3 kg)  12/31/15 160 lb 8 oz (72.8 kg)    General: Appears his stated age, well developed, well nourished in  NAD. Musculoskeletal: Gait slow but steady. He is not able to heel-toe walk, walk on toes or walk on heels without unsteadiness.  Neurological: Alert and oriented. Sensation intact to BLE.   BMET    Component Value Date/Time   NA 137 02/06/2016 1157   K 4.8 02/06/2016 1157   CL 102 02/06/2016 1157   CO2 28 02/06/2016 1157   GLUCOSE 117 (H) 02/06/2016 1157   BUN 23 02/06/2016 1157   CREATININE 0.99 02/06/2016 1157   CALCIUM 9.2 02/06/2016 1157   GFRNONAA >60 12/05/2015 0450   GFRAA >60 12/05/2015 0450    Lipid Panel     Component Value Date/Time   CHOL 171 02/06/2016 1157   TRIG 95.0 02/06/2016 1157   HDL 46.40 02/06/2016 1157   CHOLHDL 4 02/06/2016 1157   VLDL 19.0 02/06/2016 1157   LDLCALC 106 (H) 02/06/2016 1157    CBC    Component Value Date/Time   WBC 6.6 02/06/2016 1157   RBC 4.27 02/06/2016 1157   HGB 14.4 02/06/2016 1157   HCT 43.3 02/06/2016 1157   PLT 167.0 02/06/2016 1157   MCV 101.3 (H) 02/06/2016 1157   MCH 34.4 (H) 12/05/2015 0450   MCHC 33.3 02/06/2016 1157   RDW 17.2 (H) 02/06/2016 1157   LYMPHSABS 1.8 02/06/2016 1157   MONOABS 0.5 02/06/2016 1157   EOSABS 0.1 02/06/2016 1157   BASOSABS 0.0 02/06/2016 1157    Hgb A1C Lab Results  Component Value Date   HGBA1C  11/12/2009    5.3 (NOTE)                                                                       According to the ADA Clinical Practice Recommendations for 2011, when HbA1c is used as a screening test:   >=6.5%   Diagnostic of Diabetes Mellitus           (if abnormal result  is confirmed)  5.7-6.4%   Increased risk of developing Diabetes Mellitus  References:Diagnosis and Classification of Diabetes Mellitus,Diabetes [id number] 1):S62-S69 and Standards of Medical Care in         Diabetes - 2011,Diabetes [id number]  (  Suppl 1):S11-S61.             Assessment & Plan:   Mobility assessment for RX for walker:  RX for wheeled walker with seat Form completed with  patient Instructed on proper use of a rolling walker  RTC as needed Jacques Willingham, NP

## 2016-03-25 NOTE — Patient Instructions (Signed)
How to walk with a walker The best way to walk with a walker depends on whether you are using a standard walker or a front-wheeled walker. A standard walker has rubber tips on the ends of all four legs. A front-wheeled walker has wheels on the ends of the front legs and rubber tips on the ends of the back legs.  Do not use your walker on stairs or an escalator unless you have been trained by a physical therapist or unless your health care provider approves. To Walk With a Standard Walker:  1. Pick up your walker. Do not slide your standard walker. 2. Set down your walker, one step-length in front of you. Make sure that all four legs of the walker touch the ground at the same time. Your toes should be farther forward than the back legs of your walker. 3. Hold on to the walker for support, and step your weaker leg into the middle of the walker. 4. Step your stronger leg forward to land next to your weaker leg. 5. Repeat this process for each step. To Walk With a Front-Wheeled Walker: 1. Slide your front-wheeled walker one step-length in front of you. Your toes should be farther forward than the back legs of your walker. 2. Hold on to the walker for support, and step your weaker leg into the middle of the walker. 3. Step your stronger leg forward to land next to your weaker leg. 4. Repeat the process for each step. Tips  Always keep both feet within the width of the walker's legs or wheels.  When using your walker, you should not feel like you need to lean forward or to the side to keep your hands on the handgrips.  Make sure you are following any weight-bearing instructions that your health care provider has given you.  If you have a standard walker:  Do not slide your walker when you are moving.  If you have a front-wheeled walker:  Be careful not to let the walker get too far ahead of you as you walk.  If your walker does not glide well over carpet, consider cutting an "X" into two  tennis balls and placing the balls over the back legs of your walker. How to stand up with a walker 1. Put your walker in front of you. 2. Slide forward in your chair. 3. Position your legs so that your weaker leg is ahead of you and your stronger leg is bent and near your chair. 4. Position your hands.  If your chair has armrests, put each hand on an armrest.  If there are no armrests, put the hand opposite your weaker leg on the chair seat, and put the other hand on the center of the walker's crossbar. 5. Lean forward and push up from your chair. 6. Rise by straightening your stronger leg. 7. Steady yourself. 8. Carefully move your hands to the handgrips of the walker. Tips  Do not pull on the walker when you stand up. This may cause it to tip.  Sit in a firm chair whenever you can. A low seat or an overstuffed chair or sofa is hard to get out of. How to sit down with a walker To Sit Down in a Seat That Has Armrests:  1. Back up toward your seat, using your walker, until you feel the back of your legs touch the chair. 2. Carefully reach your hands behind you and put each hand on an armrest. 3. Slowly lower yourself  into the seat. To Sit Down in a Seat Without Armrests: 1. Back up toward the side of the seat, using your walker, until you feel the back of your legs touch the chair. 2. Use one hand to hold on to the back of the chair, and use the other hand to hold on to the front of the seat. 3. Slowly lower yourself into the seat. How to use a walker on a curb or step To Use a Walker to Step Up: 1. Put all four legs of the walker on the curb or step. 2. Get your feet as close to the curb or step as you can. 3. Test the steadiness of the walker by pressing down on the handgrips. 4. If the walker is steady, press down on it with your hands as you step up with your stronger leg. 5. Step up with your weaker leg. To Use a Walker to Step Down  : 1. Put all four legs of the walker on  the surface that is lower than the curb or step. 2. Get your feet as close to the curb or step as you can. 3. Test the steadiness of the walker by pressing down on the handgrips. 4. If the walker is steady, press down on it with your hands as you step down with your weaker leg. 5. Step down with your stronger leg. This information is not intended to replace advice given to you by your health care provider. Make sure you discuss any questions you have with your health care provider. Document Released: 03/29/2005 Document Revised: 08/27/2015 Document Reviewed: 10/11/2014 Elsevier Interactive Patient Education  2017 [employer].

## 2016-03-26 ENCOUNTER — Telehealth: Payer: Self-pay

## 2016-03-26 DIAGNOSIS — R2681 Unsteadiness on feet: Secondary | ICD-10-CM | POA: Diagnosis not present

## 2016-03-26 DIAGNOSIS — R42 Dizziness and giddiness: Secondary | ICD-10-CM | POA: Diagnosis not present

## 2016-03-26 NOTE — Telephone Encounter (Signed)
I called [facility] and gave R26.81 for unsteadiness and R42 for disequilibrium and was told that should be sufficient for ins. FYI to David Echevaria NP.

## 2016-03-26 NOTE — Telephone Encounter (Signed)
noted 

## 2016-03-26 NOTE — Telephone Encounter (Signed)
Dee at [facility] [place] left v/m requesting cb with ICD 10 that will cover rolling walker with seat.

## 2016-03-30 DIAGNOSIS — L570 Actinic keratosis: Secondary | ICD-10-CM | POA: Diagnosis not present

## 2016-03-30 DIAGNOSIS — X32XXXA Exposure to sunlight, initial encounter: Secondary | ICD-10-CM | POA: Diagnosis not present

## 2016-03-30 DIAGNOSIS — Z08 Encounter for follow-up examination after completed treatment for malignant neoplasm: Secondary | ICD-10-CM | POA: Diagnosis not present

## 2016-03-30 DIAGNOSIS — Z85828 Personal history of other malignant neoplasm of skin: Secondary | ICD-10-CM | POA: Diagnosis not present

## 2016-03-30 DIAGNOSIS — L821 Other seborrheic keratosis: Secondary | ICD-10-CM | POA: Diagnosis not present

## 2016-04-02 ENCOUNTER — Ambulatory Visit (INDEPENDENT_AMBULATORY_CARE_PROVIDER_SITE_OTHER): Payer: PPO | Admitting: Family Medicine

## 2016-04-02 ENCOUNTER — Encounter: Payer: Self-pay | Admitting: Family Medicine

## 2016-04-02 VITALS — BP 132/76 | HR 81 | Temp 97.8°F | Ht 66.0 in | Wt 160.0 lb

## 2016-04-02 DIAGNOSIS — M25519 Pain in unspecified shoulder: Secondary | ICD-10-CM | POA: Insufficient documentation

## 2016-04-02 DIAGNOSIS — M25511 Pain in right shoulder: Secondary | ICD-10-CM | POA: Diagnosis not present

## 2016-04-02 NOTE — Progress Notes (Signed)
Pre visit review using our clinic review tool, if applicable. No additional management support is needed unless otherwise documented below in the visit note. 

## 2016-04-02 NOTE — Assessment & Plan Note (Signed)
Bilateral-now worse on R Of note-hx of small rot cuff tear on L /that is doing better after inj and PT  R shoulder- better passive rom than active Pos rot cuff findings /mild  inst to continue tylenol/ heat prn  Ref to PT eval and tx    ? If his cervical stenosis also contributes   Demonstrated finger crawl on the wall maneuver to keep shoulder from freezing in the meantime

## 2016-04-02 NOTE — Progress Notes (Signed)
Subjective:    Patient ID: David Duncan, male    DOB: [birth year], 82 y.o.   MRN: [id number]  HPI  Both shoulders are bothering him - esp the R , for the past 2 weeks  Is interested in PT   L shoulder- can raise it past 90 deg  R one cannot get to 90- deg - hurts in lat shoulder and arm area (dull ache)   No new exercise or activity at all  No overhead work   Tylenol helps some at night  Some heat-that helps Not ice   Usually goes to [facility]     Has hx of small rot cuff tear on mri - inj by ortho and PT Also cervical spinal stenosis   No hx of R sided problems   Patient Active Problem List   Diagnosis Date Noted  . Pain in joint, shoulder region 04/02/2016  . BPH associated with nocturia 02/17/2016  . Mass in the abdomen 02/09/2016  . Disequilibrium 01/29/2016  . Unsteadiness 01/29/2016  . Family history of prostate cancer 01/07/2016  . Hypothyroidism 01/07/2016  . S/P total knee arthroplasty 12/03/2015  . Routine general medical examination at a health care facility 08/08/2015  . Congestive cardiomyopathy ([place]) 03/18/2015  . Cerebrovascular accident (CVA) ([place]) 03/18/2015  . Spinal stenosis in cervical region 02/05/2015  . Cervical spinal stenosis 02/02/2015  . Left shoulder pain 12/06/2014  . Cardiomyopathy, dilated ([place]) 09/16/2014  . Cerebral vascular accident ([place]) 09/16/2014  . Heart valve disease 09/16/2014  . MI (mitral incompetence) 08/08/2014  . TI (tricuspid incompetence) 08/08/2014  . Atrial fibrillation, chronic ([place]) 01/16/2014  . Chronic systolic heart failure ([place]) 01/16/2014  . Chronic atrial fibrillation ([place]) 01/16/2014  . Bunion, right foot 08/17/2013  . Macular degeneration 10/16/2012  . Encounter for Medicare annual wellness exam 08/16/2012  . Neck pain 02/19/2011  . Hearing decreased 12/16/2010  . Medication adverse effect 07/28/2010  . Prostate cancer screening 07/28/2010  . Long term (current) use of anticoagulants  07/22/2010  . Hyperlipidemia 06/01/2010  . ATRIAL FIBRILLATION 11/28/2009  . ATRIAL FLUTTER 11/28/2009  . CVA 11/28/2009  . BIPOLAR AFFECTIVE DISORDER 02/20/2009   Past Medical History:  Diagnosis Date  . Anxiety   . Atrial fibrillation ([place])   . Basal cell cancer 2008   on shoulder  . Bipolar disorder ([place])    hospitalized in past  . CVA (cerebral vascular accident) ([employer]) 8/11  . Generalized anxiety disorder   . GERD (gastroesophageal reflux disease)   . History of kidney stones    30 years ago  . Macular degeneration   . Melanoma ([place])    resected from scalp approximately 1 year ago.   . Osteoarthritis    in neck and left knee  . Tremor    noted when writing   Past Surgical History:  Procedure Laterality Date  . BACK SURGERY  1991  . CATARACT EXTRACTION W/ INTRAOCULAR LENS IMPLANT Bilateral   . KNEE ARTHROPLASTY Left 12/03/2015   Procedure: COMPUTER ASSISTED TOTAL KNEE ARTHROPLASTY;  Surgeon: Dereck Leep, MD;  Location: [facility] ORS;  Service: Orthopedics;  Laterality: Left;  . SKIN CANCER EXCISION     Social History  Substance Use Topics  . Smoking status: Never Smoker  . Smokeless tobacco: Never Used  . Alcohol use No   Family History  Problem Relation Age of Onset  . Stroke Father   . Cancer Brother     prostate  . Coronary artery disease Brother   .  Hypertension Brother    No Known Allergies Current Outpatient Prescriptions on File Prior to Visit  Medication Sig Dispense Refill  . Ascorbic Acid (VITAMIN C) 500 MG tablet Take 500 mg by mouth 2 (two) times daily.     . B Complex Vitamins (B COMPLEX PO) Take by mouth 2 (two) times daily.     . Cholecalciferol (VITAMIN D-3) 5000 UNITS TABS Take 1 tablet by mouth daily.     . Cyanocobalamin (VITAMIN B-12) 2500 MCG SUBL Take 2 tabs by mouth daily     . escitalopram (LEXAPRO) 10 MG tablet Take 1 tablet (10 mg total) by mouth every morning. 90 tablet 0  . L-Methylfolate-Algae (DEPLIN 15) 15-90.314 MG CAPS Take 15  mg by mouth every morning. 28 capsule 0  . Lutein 6 MG CAPS Take 6 mg by mouth daily.     . metoprolol tartrate (LOPRESSOR) 25 MG tablet Take 25 mg by mouth 2 (two) times daily.     [name] Kitchen pyridOXINE (VITAMIN B-6) 25 MG tablet Take 25 mg by mouth 2 (two) times daily.     . tamsulosin (FLOMAX) 0.4 MG CAPS capsule Take 1 capsule (0.4 mg total) by mouth daily. 30 capsule 11  . warfarin (COUMADIN) 2.5 MG tablet Take 2.5 mg by mouth daily at 6 PM. Take 1 tablet daily 5 days a week and on the 6th day take 2 tablets    . warfarin (COUMADIN) 5 MG tablet Use as directed by anticoagulation clinic (Patient taking differently: Use as directed by anticoagulation clinic every 6th day) 90 tablet 1   No current facility-administered medications on file prior to visit.     Review of Systems    Review of Systems  Constitutional: Negative for fever, appetite change, fatigue and unexpected weight change.  Eyes: Negative for pain and visual disturbance.  Respiratory: Negative for cough and shortness of breath.   Cardiovascular: Negative for cp or palpitations    Gastrointestinal: Negative for nausea, diarrhea and constipation.  Genitourinary: Negative for urgency and frequency.  Skin: Negative for pallor or rash   MSK pos for shoulder pain worse on the r with stiffness  Neurological: Negative for weakness, light-headedness, numbness and headaches.  Hematological: Negative for adenopathy. Does not bruise/bleed easily.  Psychiatric/Behavioral: Negative for dysphoric mood. The patient is not nervous/anxious.      Objective:   Physical Exam  Constitutional: He appears well-developed and well-nourished. No distress.  Well appearing elderly male   Eyes: Conjunctivae and EOM are normal. Pupils are equal, round, and reactive to light.  Neck: Normal range of motion. Neck supple.  Nl rom of CS today   Cardiovascular: Normal rate.   Pulmonary/Chest: Effort normal and breath sounds normal. No respiratory distress. He  has no wheezes. He has no rales.  Musculoskeletal: He exhibits tenderness. He exhibits no edema.       Right shoulder: He exhibits decreased range of motion, tenderness and bony tenderness. He exhibits no swelling, no effusion, no crepitus, no deformity, normal pulse and normal strength.       Left shoulder: He exhibits normal range of motion, no tenderness, no bony tenderness, no effusion, no crepitus and no deformity.  R shoulder - pos Hawking/neer tests  Mild acromion tenderness Nl rom passive Limited abduction (active) to 50 degrees  Pain with external rotation  Int rot is fine  No neuro changes   Lymphadenopathy:    He has no cervical adenopathy.  Neurological: He has normal reflexes. He displays no atrophy.  No cranial nerve deficit or sensory deficit. He exhibits normal muscle tone.  Skin: Skin is warm and dry. No rash noted. No erythema.  Psychiatric: He has a normal mood and affect.          Assessment & Plan:   Problem List Items Addressed This Visit      Other   Pain in joint, shoulder region    Bilateral-now worse on R Of note-hx of small rot cuff tear on L /that is doing better after inj and PT  R shoulder- better passive rom than active Pos rot cuff findings /mild  inst to continue tylenol/ heat prn  Ref to PT eval and tx    ? If his cervical stenosis also contributes   Demonstrated finger crawl on the wall maneuver to keep shoulder from freezing in the meantime      Relevant Orders   Ambulatory referral to Physical Therapy

## 2016-04-02 NOTE — Patient Instructions (Signed)
Keep working on passive range of motion of the right shoulder (the finger crawl on the wall exercise)- to keep it from freezing up  Use heat whenever you want  Tylenol is ok  Stop at check out - to work on PT referral

## 2016-04-07 DIAGNOSIS — Z7901 Long term (current) use of anticoagulants: Secondary | ICD-10-CM | POA: Diagnosis not present

## 2016-04-23 ENCOUNTER — Ambulatory Visit (INDEPENDENT_AMBULATORY_CARE_PROVIDER_SITE_OTHER): Payer: PPO | Admitting: Psychiatry

## 2016-04-23 ENCOUNTER — Encounter: Payer: Self-pay | Admitting: Psychiatry

## 2016-04-23 VITALS — BP 117/83 | HR 105 | Temp 98.3°F | Wt 164.6 lb

## 2016-04-23 DIAGNOSIS — F330 Major depressive disorder, recurrent, mild: Secondary | ICD-10-CM

## 2016-04-23 DIAGNOSIS — G8929 Other chronic pain: Secondary | ICD-10-CM | POA: Diagnosis not present

## 2016-04-23 DIAGNOSIS — Z7901 Long term (current) use of anticoagulants: Secondary | ICD-10-CM | POA: Diagnosis not present

## 2016-04-23 DIAGNOSIS — M25511 Pain in right shoulder: Secondary | ICD-10-CM | POA: Diagnosis not present

## 2016-04-23 DIAGNOSIS — F411 Generalized anxiety disorder: Secondary | ICD-10-CM

## 2016-04-23 DIAGNOSIS — M25512 Pain in left shoulder: Secondary | ICD-10-CM | POA: Diagnosis not present

## 2016-04-23 MED ORDER — ESCITALOPRAM OXALATE 10 MG PO TABS: 15.0000 mg | ORAL_TABLET | ORAL | 0 refills | Status: DC

## 2016-04-23 MED ORDER — DEPLIN 15 15-90.314 MG PO CAPS: 15.0000 mg | ORAL_CAPSULE | ORAL | 0 refills | Status: DC

## 2016-04-23 NOTE — Progress Notes (Signed)
BH MD/PA/NP OP Progress Note  04/23/2016 11:06 AM David Duncan  MRN:  [id number]  Subjective:  Patient is a 83 year old male who presented for the follow-up. He reported that he is feeling depressed and was asking for something for his depressive symptoms. He remains pleasant and cooperative. He reported that he started taking the Deplin and has noticed some improvement on the medication. He reported that he was running out so he started taking it on alternate days. We discussed about the medication. He stated that he is willing to continue the medication on a regular basis. We discussed about going higher on the dose of Lexapro and he agreed with the plan. Patient appears pleasant and cooperative during the interview. He reported that he eats well and has been eating frozen dinners. I discussed with him about starting Meals on Wheels but he declined. 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  He  currently denied having any suicidal homicidal ideations or plans. he denied having any perceptual disturbances. He walks  Slowly but remains alert and cooperative.       Chief Complaint:  Chief Complaint    Follow-up; Medication Refill     Visit Diagnosis:     ICD-9-CM ICD-10-CM   1. MDD (major depressive disorder), recurrent episode, mild (HCC) 296.31 F33.0   2. GAD (generalized anxiety disorder) 300.02 F41.1     Past Medical History:  Past Medical History:  Diagnosis Date  . Anxiety   . Atrial fibrillation ([place])   . Basal cell cancer 2008   on shoulder  . Bipolar disorder ([place])    hospitalized in past  . CVA (cerebral vascular accident) ([place]) 8/11  . Generalized anxiety disorder   . GERD (gastroesophageal reflux disease)   . History of kidney stones    30 years ago  . Macular degeneration   . Melanoma ([place])    resected from scalp approximately 1 year ago.   .  Osteoarthritis    in neck and left knee  . Tremor    noted when writing    Past Surgical History:  Procedure Laterality Date  . BACK SURGERY  1991  . CATARACT EXTRACTION W/ INTRAOCULAR LENS IMPLANT Bilateral   . KNEE ARTHROPLASTY Left 12/03/2015   Procedure: COMPUTER ASSISTED TOTAL KNEE ARTHROPLASTY;  Surgeon: Dereck Leep, MD;  Location: [facility] ORS;  Service: Orthopedics;  Laterality: Left;  . SKIN CANCER EXCISION     Family History:  Family History  Problem Relation Age of Onset  . Stroke Father   . Cancer Brother     prostate  . Coronary artery disease Brother   . Hypertension Brother    Social History:  Social History   Social History  . Marital status: Married    Spouse name: N/A  . Number of children: N/A  . Years of education: N/A   Occupational History  . retired     used to work on farm, Press photographer, Cabin crew   Social History Main Topics  . Smoking status: Never Smoker  . Smokeless tobacco: Never Used  . Alcohol use No  . Drug use: No  . Sexual activity: No   Other Topics Concern  . None   Social History Narrative   Moved from [place] 3/10   Walks 30-40 minutes daily   Additional History:   Assessment:    Musculoskeletal:  Strength & Muscle Tone: within normal limits Gait & Station: normal Patient leans: N/A  Psychiatric Specialty Exam: Medication Refill   Anxiety  Patient reports no insomnia, nervous/anxious behavior or suicidal ideas.    Depression         Associated symptoms include does not have insomnia and no suicidal ideas.  Past medical history includes anxiety.     Review of Systems  Psychiatric/Behavioral: Negative for depression, hallucinations, memory loss, substance abuse and suicidal ideas. The patient is not nervous/anxious and does not have insomnia.   All other systems reviewed and are negative.   Blood pressure 117/83, pulse (!) 105, temperature 98.3 F (36.8 C), temperature source Oral, weight 164 lb 9.6 oz (74.7  kg).Body mass index is 26.57 kg/m.  General Appearance: Well Groomed and slow gait  Eye Contact:  Good  Speech:  Normal Rate and Slow  Volume:  Normal  Mood:  Good  Affect:  Appropriate and Full Range  Thought Process:  Linear and Logical  Orientation:  Full (Time, Place, and Person)  Thought Content:  Negative  Suicidal Thoughts:  No  Homicidal Thoughts:  No  Memory:  Immediate;   Good Recent;   Good Remote;   Good  Judgement:  Good  Insight:  Good  Psychomotor Activity:  Normal  Concentration:  Good  Recall:  Good  Fund of Knowledge: Good  Language: Good  Akathisia:  Negative  Handed:  Right unknown  AIMS (if indicated):  No done  Assets:  Communication Skills Desire for Improvement  ADL's:  Intact  Cognition: WNL  Sleep:  good   Is the patient at risk to self?  No. Has the patient been a risk to self in the past 6 months?  No. Has the patient been a risk to self within the distant past?  No. Is the patient a risk to others?  No. Has the patient been a risk to others in the past 6 months?  No. Has the patient been a risk to others within the distant past?  No.  Current Medications: Current Outpatient Prescriptions  Medication Sig Dispense Refill  . Ascorbic Acid (VITAMIN C) 500 MG tablet Take 500 mg by mouth 2 (two) times daily.     . B Complex Vitamins (B COMPLEX PO) Take by mouth 2 (two) times daily.     . Cholecalciferol (VITAMIN D-3) 5000 UNITS TABS Take 1 tablet by mouth daily.     . Cyanocobalamin (VITAMIN B-12) 2500 MCG SUBL Take 2 tabs by mouth daily     . escitalopram (LEXAPRO) 10 MG tablet Take 1.5 tablets (15 mg total) by mouth every morning. 135 tablet 0  . L-Methylfolate-Algae (DEPLIN 15) 15-90.314 MG CAPS Take 15 mg by mouth every morning. 36 capsule 0  . Lutein 6 MG CAPS Take 6 mg by mouth daily.     . metoprolol tartrate (LOPRESSOR) 25 MG tablet Take 25 mg by mouth 2 (two) times daily.     [name] Kitchen pyridOXINE (VITAMIN B-6) 25 MG tablet Take 25 mg by mouth  2 (two) times daily.     . tamsulosin (FLOMAX) 0.4 MG CAPS capsule Take 1 capsule (0.4 mg total) by mouth daily. 30 capsule 11  . warfarin (COUMADIN) 2.5 MG tablet Take 2.5 mg by mouth daily at 6 PM. Take 1 tablet daily 5 days a week and on the 6th day take 2 tablets    . warfarin (COUMADIN) 5 MG tablet Use as directed by anticoagulation clinic (Patient taking differently:  Use as directed by anticoagulation clinic every 6th day) 90 tablet 1   No current facility-administered medications for this visit.     Medical Decision Making:  Established Problem, Stable/Improving (1)  Treatment Plan Summary:Medication management We discussed at length about the medications treatment risks benefits and alternatives  Continue Lexapro 15 mg daily. Will start him on Deplin 15 mg one pill daily and he was given samples for the next 5  weeks. Advised him to call if he notices worsening of his symptoms and he demonstrated understanding.  Follow-up in 5  weeks or earlier depending on his symptoms     More than 50% of the time spent in psychoeducation, counseling and coordination of care.    This note was generated in part or whole with voice recognition software. Voice regonition is usually quite accurate but there are transcription errors that can and very often do occur. I apologize for any typographical errors that were not detected and corrected.     Rainey Pines, MD  04/23/2016, 11:06 AM

## 2016-04-26 DIAGNOSIS — G8929 Other chronic pain: Secondary | ICD-10-CM | POA: Diagnosis not present

## 2016-04-26 DIAGNOSIS — M25511 Pain in right shoulder: Secondary | ICD-10-CM | POA: Diagnosis not present

## 2016-04-26 DIAGNOSIS — M25512 Pain in left shoulder: Secondary | ICD-10-CM | POA: Diagnosis not present

## 2016-05-03 DIAGNOSIS — M25511 Pain in right shoulder: Secondary | ICD-10-CM | POA: Diagnosis not present

## 2016-05-03 DIAGNOSIS — G8929 Other chronic pain: Secondary | ICD-10-CM | POA: Diagnosis not present

## 2016-05-03 DIAGNOSIS — M25512 Pain in left shoulder: Secondary | ICD-10-CM | POA: Diagnosis not present

## 2016-05-06 DIAGNOSIS — G8929 Other chronic pain: Secondary | ICD-10-CM | POA: Diagnosis not present

## 2016-05-06 DIAGNOSIS — M25511 Pain in right shoulder: Secondary | ICD-10-CM | POA: Diagnosis not present

## 2016-05-06 DIAGNOSIS — M25512 Pain in left shoulder: Secondary | ICD-10-CM | POA: Diagnosis not present

## 2016-05-11 DIAGNOSIS — M25511 Pain in right shoulder: Secondary | ICD-10-CM | POA: Diagnosis not present

## 2016-05-11 DIAGNOSIS — G8929 Other chronic pain: Secondary | ICD-10-CM | POA: Diagnosis not present

## 2016-05-11 DIAGNOSIS — M25512 Pain in left shoulder: Secondary | ICD-10-CM | POA: Diagnosis not present

## 2016-05-13 DIAGNOSIS — G8929 Other chronic pain: Secondary | ICD-10-CM | POA: Diagnosis not present

## 2016-05-13 DIAGNOSIS — M25511 Pain in right shoulder: Secondary | ICD-10-CM | POA: Diagnosis not present

## 2016-05-13 DIAGNOSIS — M25512 Pain in left shoulder: Secondary | ICD-10-CM | POA: Diagnosis not present

## 2016-05-19 DIAGNOSIS — M25511 Pain in right shoulder: Secondary | ICD-10-CM | POA: Diagnosis not present

## 2016-05-19 DIAGNOSIS — M25512 Pain in left shoulder: Secondary | ICD-10-CM | POA: Diagnosis not present

## 2016-05-19 DIAGNOSIS — G8929 Other chronic pain: Secondary | ICD-10-CM | POA: Diagnosis not present

## 2016-05-21 DIAGNOSIS — G8929 Other chronic pain: Secondary | ICD-10-CM | POA: Diagnosis not present

## 2016-05-21 DIAGNOSIS — M25511 Pain in right shoulder: Secondary | ICD-10-CM | POA: Diagnosis not present

## 2016-05-21 DIAGNOSIS — M25512 Pain in left shoulder: Secondary | ICD-10-CM | POA: Diagnosis not present

## 2016-05-26 ENCOUNTER — Ambulatory Visit (INDEPENDENT_AMBULATORY_CARE_PROVIDER_SITE_OTHER): Payer: PPO | Admitting: Family Medicine

## 2016-05-26 ENCOUNTER — Encounter: Payer: Self-pay | Admitting: Psychiatry

## 2016-05-26 ENCOUNTER — Encounter: Payer: Self-pay | Admitting: Family Medicine

## 2016-05-26 ENCOUNTER — Ambulatory Visit (INDEPENDENT_AMBULATORY_CARE_PROVIDER_SITE_OTHER)
Admission: RE | Admit: 2016-05-26 | Discharge: 2016-05-26 | Disposition: A | Discharge status: Home / Self Care | Payer: PPO | Source: Ambulatory Visit | Attending: Family Medicine | Admitting: Family Medicine

## 2016-05-26 ENCOUNTER — Ambulatory Visit (INDEPENDENT_AMBULATORY_CARE_PROVIDER_SITE_OTHER): Payer: PPO | Admitting: Psychiatry

## 2016-05-26 VITALS — BP 150/102 | HR 121 | Temp 97.4°F | Wt 167.4 lb

## 2016-05-26 VITALS — BP 130/78 | HR 96 | Temp 97.7°F | Ht 66.0 in | Wt 167.8 lb

## 2016-05-26 DIAGNOSIS — R4189 Other symptoms and signs involving cognitive functions and awareness: Secondary | ICD-10-CM

## 2016-05-26 DIAGNOSIS — F319 Bipolar disorder, unspecified: Secondary | ICD-10-CM

## 2016-05-26 DIAGNOSIS — S4991XA Unspecified injury of right shoulder and upper arm, initial encounter: Secondary | ICD-10-CM

## 2016-05-26 DIAGNOSIS — I5022 Chronic systolic (congestive) heart failure: Secondary | ICD-10-CM | POA: Diagnosis not present

## 2016-05-26 DIAGNOSIS — F330 Major depressive disorder, recurrent, mild: Secondary | ICD-10-CM | POA: Diagnosis not present

## 2016-05-26 DIAGNOSIS — I071 Rheumatic tricuspid insufficiency: Secondary | ICD-10-CM | POA: Diagnosis not present

## 2016-05-26 DIAGNOSIS — I420 Dilated cardiomyopathy: Secondary | ICD-10-CM

## 2016-05-26 DIAGNOSIS — I482 Chronic atrial fibrillation, unspecified: Secondary | ICD-10-CM

## 2016-05-26 MED ORDER — DEPLIN 15 15-90.314 MG PO CAPS: 15.0000 mg | ORAL_CAPSULE | ORAL | 0 refills | Status: DC

## 2016-05-26 MED ORDER — ARIPIPRAZOLE 2 MG PO TABS: 2.0000 mg | ORAL_TABLET | Freq: Every day | ORAL | 1 refills | Status: DC

## 2016-05-26 NOTE — Progress Notes (Signed)
Pre visit review using our clinic review tool, if applicable. No additional management support is needed unless otherwise documented below in the visit note. 

## 2016-05-26 NOTE — Assessment & Plan Note (Addendum)
After catching himself during a fall  No swelling or deformity  Xray now  May need MRI or ortho consult  Tylenol prn -disc max dose  Can continue PT on the other shoulder  Cold compress to R shoulder prn  Of note-pt has chronic pain in this shoulder baseline

## 2016-05-26 NOTE — Patient Instructions (Signed)
You can take 2 tylenol up to every 4-6 hours as needed.  Use ice on the right shoulder 10 minutes on and off whenever you can  Do the finger crawl/wall exercise to prevent stiffness  Xray now  We will get back to you with results and a plan

## 2016-05-26 NOTE — Progress Notes (Signed)
Subjective:    Patient ID: David Duncan, male    DOB: Jan 14, 1931, 83 y.o.   MRN: [id number]  HPI Here for f/u of a fall on 05/16/16 with shoulder injury (with R shoulder pain more than usual)  Bent over to open a drawer in bedroom  Stood up and lost balance (no dizziness)  [place] - backwards (beside a door on his R side) - reached out with R arm to catch himself but could not  Did not hit anything  It hurt immed No swelling  Put heat on it (had been using heat for chronic shoulder pain)   Of note- recently PT had made it worse  He thinks his balance has never been good but denies dizziness  Pain is in the top of the shoulder and goes down the lateral arm  No bruising  Hurts most to try to raise it   He can hold a fork and move arm from the elbow  Does not hurt at rest   Patient Active Problem List   Diagnosis Date Noted  . Right shoulder injury 05/26/2016  . Pain in joint, shoulder region 04/02/2016  . BPH associated with nocturia 02/17/2016  . Mass in the abdomen 02/09/2016  . Disequilibrium 01/29/2016  . Unsteadiness 01/29/2016  . Family history of prostate cancer 01/07/2016  . Hypothyroidism 01/07/2016  . S/P total knee arthroplasty 12/03/2015  . Routine general medical examination at a health care facility 08/08/2015  . Congestive cardiomyopathy ([place]) 03/18/2015  . Cerebrovascular accident (CVA) ([place]) 03/18/2015  . Spinal stenosis in cervical region 02/05/2015  . Cervical spinal stenosis 02/02/2015  . Left shoulder pain 12/06/2014  . Cardiomyopathy, dilated ([place]) 09/16/2014  . Cerebral vascular accident ([place]) 09/16/2014  . Heart valve disease 09/16/2014  . MI (mitral incompetence) 08/08/2014  . TI (tricuspid incompetence) 08/08/2014  . Atrial fibrillation, chronic ([place]) 01/16/2014  . Chronic systolic heart failure ([place]) 01/16/2014  . Chronic atrial fibrillation ([place]) 01/16/2014  . Bunion, right foot 08/17/2013  . Macular degeneration 10/16/2012  . Encounter  for Medicare annual wellness exam 08/16/2012  . Neck pain 02/19/2011  . Hearing decreased 12/16/2010  . Medication adverse effect 07/28/2010  . Prostate cancer screening 07/28/2010  . Long term (current) use of anticoagulants 07/22/2010  . Hyperlipidemia 06/01/2010  . ATRIAL FIBRILLATION 11/28/2009  . ATRIAL FLUTTER 11/28/2009  . CVA 11/28/2009  . BIPOLAR AFFECTIVE DISORDER 02/20/2009   Past Medical History:  Diagnosis Date  . Anxiety   . Atrial fibrillation ([place])   . Basal cell cancer 2008   on shoulder  . Bipolar disorder ([place])    hospitalized in past  . CVA (cerebral vascular accident) ([place]) 8/11  . Generalized anxiety disorder   . GERD (gastroesophageal reflux disease)   . History of kidney stones    30 years ago  . Macular degeneration   . Melanoma ([place])    resected from scalp approximately 1 year ago.   . Osteoarthritis    in neck and left knee  . Tremor    noted when writing   Past Surgical History:  Procedure Laterality Date  . BACK SURGERY  1991  . CATARACT EXTRACTION W/ INTRAOCULAR LENS IMPLANT Bilateral   . KNEE ARTHROPLASTY Left 12/03/2015   Procedure: COMPUTER ASSISTED TOTAL KNEE ARTHROPLASTY;  Surgeon: Dereck Leep, MD;  Location: [facility] ORS;  Service: Orthopedics;  Laterality: Left;  . SKIN CANCER EXCISION     Social History  Substance Use Topics  . Smoking status: Never  Smoker  . Smokeless tobacco: Never Used  . Alcohol use No   Family History  Problem Relation Age of Onset  . Stroke Father   . Cancer Brother     prostate  . Coronary artery disease Brother   . Hypertension Brother    No Known Allergies Current Outpatient Prescriptions on File Prior to Visit  Medication Sig Dispense Refill  . Ascorbic Acid (VITAMIN C) 500 MG tablet Take 500 mg by mouth 2 (two) times daily.     . B Complex Vitamins (B COMPLEX PO) Take by mouth 2 (two) times daily.     . Cholecalciferol (VITAMIN D-3) 5000 UNITS TABS Take 1 tablet by mouth daily.     .  Cyanocobalamin (VITAMIN B-12) 2500 MCG SUBL Take 2 tabs by mouth daily     . escitalopram (LEXAPRO) 10 MG tablet Take 1.5 tablets (15 mg total) by mouth every morning. 135 tablet 0  . Lutein 6 MG CAPS Take 6 mg by mouth daily.     . metoprolol tartrate (LOPRESSOR) 25 MG tablet Take 25 mg by mouth 2 (two) times daily.     [name] Kitchen pyridOXINE (VITAMIN B-6) 25 MG tablet Take 25 mg by mouth 2 (two) times daily.     . tamsulosin (FLOMAX) 0.4 MG CAPS capsule Take 1 capsule (0.4 mg total) by mouth daily. 30 capsule 11  . warfarin (COUMADIN) 2.5 MG tablet Take 2.5 mg by mouth daily at 6 PM. Take 1 tablet daily 5 days a week and on the 6th day take 2 tablets    . warfarin (COUMADIN) 5 MG tablet Use as directed by anticoagulation clinic (Patient taking differently: Use as directed by anticoagulation clinic every 6th day) 90 tablet 1  . ARIPiprazole (ABILIFY) 2 MG tablet Take 1 tablet (2 mg total) by mouth daily. 30 tablet 1  . L-Methylfolate-Algae (DEPLIN 15) 15-90.314 MG CAPS Take 15 mg by mouth every morning. 28 capsule 0   No current facility-administered medications on file prior to visit.     Review of Systems Review of Systems  Constitutional: Negative for fever, appetite change, fatigue and unexpected weight change.  Eyes: Negative for pain and visual disturbance.  Respiratory: Negative for cough and shortness of breath.   Cardiovascular: Negative for cp or palpitations    Gastrointestinal: Negative for nausea, diarrhea and constipation.  Genitourinary: Negative for urgency and frequency.  Skin: Negative for pallor or rash   Neurological: Negative for weakness, light-headedness, numbness and headaches. pos for poor balance  MSK pos for R shoulder pain and limited mobility Hematological: Negative for adenopathy. Does not bruise/bleed easily.  Psychiatric/Behavioral: Negative for dysphoric mood. The patient is not nervous/anxious.  pos for hx of bipolar dz that is fairly controlled        Objective:    Physical Exam  Constitutional: He appears well-developed and well-nourished. No distress.  Frail appearing elderly male  Eyes: Conjunctivae and EOM are normal. Pupils are equal, round, and reactive to light.  Neck: Normal range of motion. Neck supple.  Cardiovascular: Normal rate and normal heart sounds.   Pulmonary/Chest: Effort normal and breath sounds normal. No respiratory distress. He has no wheezes. He has no rales.  Musculoskeletal: He exhibits tenderness. He exhibits no edema or deformity.       Right shoulder: He exhibits decreased range of motion, tenderness, bony tenderness and pain. He exhibits no swelling, no effusion, no crepitus, normal pulse and normal strength.  Tender over anterior and superior shoulder including acromion  No deformity  Pos Hawking and neer tests  Limited int/ ext rotation / active flex and abduction of UE  Nl extension  Passive rom - abduct to 90 deg and flex to 90 deg   No neuro changes  Nl rom of elbow/wrist and hand with nl strength   Lymphadenopathy:    He has no cervical adenopathy.  Neurological: He displays no atrophy. No sensory deficit. He exhibits normal muscle tone. Coordination normal.  Skin: Skin is warm and dry. No rash noted. No erythema.  Psychiatric: His affect is blunt.  Baseline affect- somewhat blunted  Pleasant  Wife is with him today          Assessment & Plan:   Problem List Items Addressed This Visit      Cardiovascular and Mediastinum   Cardiomyopathy, dilated ([place])    No changes  Treated by cardiology      Chronic atrial fibrillation ([place])    Rate controlled  On warfarin for anticoag      Chronic systolic heart failure (HCC)    Stable / no symptoms currently        Other   Bipolar disorder ([place])    Continues care from psychiatry  Fairly stable right now       Right shoulder injury    After catching himself during a fall  No swelling or deformity  Xray now  May need MRI or ortho consult    Tylenol prn -disc max dose  Can continue PT on the other shoulder  Cold compress to R shoulder prn  Of note-pt has chronic pain in this shoulder baseline      Relevant Orders   DG Shoulder Right (Completed)

## 2016-05-26 NOTE — Progress Notes (Signed)
BH MD/PA/NP OP Progress Note  05/26/2016 3:21 PM David Duncan  MRN:  [id number]  Subjective:  Patient is a 83 year old male who presented for the follow-up. He reported that he is not doing so good. He was walking with a shuffling gait as he has recently had a fall at home. Patient reported that he is having pain in his right arm. Patient reported that his wife and a handyman at home helped him. He reported that he continues to feel depressed and is also having memory issues. We discussed about his medications. We discussed about the option of ECT as his wife has already called in the past. Patient was asking about the option. He reported that now he does not want to have the ECT done at this time. He is interested in having his medications adjusted. I will start him on Abilify. He is also taking Deplin and reported that it has been helpful. Patient currently denied having any suicidal homicidal ideations or plans.   His wife brought him for the appointment. She remains supportive.   Chief Complaint:  Chief Complaint    Follow-up; Medication Refill     Visit Diagnosis:     ICD-9-CM ICD-10-CM   1. MDD (major depressive disorder), recurrent episode, mild (HCC) 296.31 F33.0   2. Cognitive decline 294.9 R41.89     Past Medical History:  Past Medical History:  Diagnosis Date  . Anxiety   . Atrial fibrillation ([place])   . Basal cell cancer 2008   on shoulder  . Bipolar disorder ([place])    hospitalized in past  . CVA (cerebral vascular accident) ([place]) 8/11  . Generalized anxiety disorder   . GERD (gastroesophageal reflux disease)   . History of kidney stones    30 years ago  . Macular degeneration   . Melanoma ([place])    resected from scalp approximately 1 year ago.   . Osteoarthritis    in neck and left knee  . Tremor    noted when writing    Past Surgical History:  Procedure Laterality Date  . BACK SURGERY  1991  . CATARACT EXTRACTION W/ INTRAOCULAR LENS IMPLANT Bilateral   .  KNEE ARTHROPLASTY Left 12/03/2015   Procedure: COMPUTER ASSISTED TOTAL KNEE ARTHROPLASTY;  Surgeon: Dereck Leep, MD;  Location: [facility] ORS;  Service: Orthopedics;  Laterality: Left;  . SKIN CANCER EXCISION     Family History:  Family History  Problem Relation Age of Onset  . Stroke Father   . Cancer Brother     prostate  . Coronary artery disease Brother   . Hypertension Brother    Social History:  Social History   Social History  . Marital status: Married    Spouse name: N/A  . Number of children: N/A  . Years of education: N/A   Occupational History  . retired     used to work on farm, Press photographer, Cabin crew   Social History Main Topics  . Smoking status: Never Smoker  . Smokeless tobacco: Never Used  . Alcohol use No  . Drug use: No  . Sexual activity: No   Other Topics Concern  . None   Social History Narrative   Moved from [place] 3/10   Walks 30-40 minutes daily   Additional History:   Assessment:    Musculoskeletal: Strength & Muscle Tone: within normal limits Gait & Station: normal Patient leans: N/A  Psychiatric Specialty Exam: Medication Refill   Anxiety  Patient reports no insomnia, nervous/anxious behavior  or suicidal ideas.    Depression         Associated symptoms include does not have insomnia and no suicidal ideas.  Past medical history includes anxiety.     Review of Systems  Psychiatric/Behavioral: Negative for depression, hallucinations, memory loss, substance abuse and suicidal ideas. The patient is not nervous/anxious and does not have insomnia.   All other systems reviewed and are negative.   Blood pressure (!) 150/102, pulse (!) 121, temperature 97.4 F (36.3 C), temperature source Oral, weight 167 lb 6.4 oz (75.9 kg).Body mass index is 27.02 kg/m.  General Appearance: Well Groomed and slow gait  Eye Contact:  Good  Speech:  Normal Rate and Slow  Volume:  Normal  Mood:  Good  Affect:  Appropriate and Full Range  Thought  Process:  Linear and Logical  Orientation:  Full (Time, Place, and Person)  Thought Content:  Negative  Suicidal Thoughts:  No  Homicidal Thoughts:  No  Memory:  Immediate;   Good Recent;   Good Remote;   Good  Judgement:  Good  Insight:  Good  Psychomotor Activity:  Normal  Concentration:  Good  Recall:  Good  Fund of Knowledge: Good  Language: Good  Akathisia:  Negative  Handed:  Right unknown  AIMS (if indicated):  No done  Assets:  Communication Skills Desire for Improvement  ADL's:  Intact  Cognition: WNL  Sleep:  good   Is the patient at risk to self?  No. Has the patient been a risk to self in the past 6 months?  No. Has the patient been a risk to self within the distant past?  No. Is the patient a risk to others?  No. Has the patient been a risk to others in the past 6 months?  No. Has the patient been a risk to others within the distant past?  No.  Current Medications: Current Outpatient Prescriptions  Medication Sig Dispense Refill  . Ascorbic Acid (VITAMIN C) 500 MG tablet Take 500 mg by mouth 2 (two) times daily.     . B Complex Vitamins (B COMPLEX PO) Take by mouth 2 (two) times daily.     . Cholecalciferol (VITAMIN D-3) 5000 UNITS TABS Take 1 tablet by mouth daily.     . Cyanocobalamin (VITAMIN B-12) 2500 MCG SUBL Take 2 tabs by mouth daily     . escitalopram (LEXAPRO) 10 MG tablet Take 1.5 tablets (15 mg total) by mouth every morning. 135 tablet 0  . L-Methylfolate-Algae (DEPLIN 15) 15-90.314 MG CAPS Take 15 mg by mouth every morning. 28 capsule 0  . Lutein 6 MG CAPS Take 6 mg by mouth daily.     . metoprolol tartrate (LOPRESSOR) 25 MG tablet Take 25 mg by mouth 2 (two) times daily.     [name] Kitchen pyridOXINE (VITAMIN B-6) 25 MG tablet Take 25 mg by mouth 2 (two) times daily.     . tamsulosin (FLOMAX) 0.4 MG CAPS capsule Take 1 capsule (0.4 mg total) by mouth daily. 30 capsule 11  . warfarin (COUMADIN) 2.5 MG tablet Take 2.5 mg by mouth daily at 6 PM. Take 1 tablet  daily 5 days a week and on the 6th day take 2 tablets    . warfarin (COUMADIN) 5 MG tablet Use as directed by anticoagulation clinic (Patient taking differently: Use as directed by anticoagulation clinic every 6th day) 90 tablet 1  . ARIPiprazole (ABILIFY) 2 MG tablet Take 1 tablet (2 mg total) by mouth daily. [place]  tablet 1   No current facility-administered medications for this visit.     Medical Decision Making:  Established Problem, Stable/Improving (1)  Treatment Plan Summary:Medication management We discussed at length about the medications treatment risks benefits and alternatives  Continue Lexapro 10 mg daily. Will start him on Deplin 15 mg one pill daily and he was given samples for the next 5  weeks.  I will also start him on Abilify 1 mg daily for his depressive symptoms. He agreed with the plan.  Advised him to call if he notices worsening of his symptoms and he demonstrated understanding.  Follow-up in 4  weeks or earlier depending on his symptoms     More than 50% of the time spent in psychoeducation, counseling and coordination of care.    This note was generated in part or whole with voice recognition software. Voice regonition is usually quite accurate but there are transcription errors that can and very often do occur. I apologize for any typographical errors that were not detected and corrected.     Rainey Pines, MD  05/26/2016, 3:21 PM

## 2016-05-27 ENCOUNTER — Telehealth: Payer: Self-pay | Admitting: Family Medicine

## 2016-05-27 DIAGNOSIS — S4991XA Unspecified injury of right shoulder and upper arm, initial encounter: Secondary | ICD-10-CM

## 2016-05-27 NOTE — Assessment & Plan Note (Signed)
No changes  Treated by cardiology

## 2016-05-27 NOTE — Telephone Encounter (Signed)
-----   Message from Tammi Sou, [place] sent at 05/27/2016  8:26 AM EST ----- Pt's wife notified of xray results and Dr. Marliss Coots comments. Wife does want pt to go to Ortho, please put referral in and they would like to see someone in [place], I advise them our [facility] will call to schedule appt

## 2016-05-27 NOTE — Assessment & Plan Note (Signed)
Rate controlled  On warfarin for anticoag

## 2016-05-27 NOTE — Assessment & Plan Note (Signed)
Continues care from psychiatry  Fairly stable right now

## 2016-05-27 NOTE — Assessment & Plan Note (Signed)
Stable/ no symptoms currently 

## 2016-05-27 NOTE — Telephone Encounter (Signed)
Referral faxed to [facility], waiting for appt to be made. [place]

## 2016-05-27 NOTE — Telephone Encounter (Signed)
Ref done Will route to [facility]  

## 2016-05-31 DIAGNOSIS — M25512 Pain in left shoulder: Secondary | ICD-10-CM | POA: Diagnosis not present

## 2016-05-31 DIAGNOSIS — G8929 Other chronic pain: Secondary | ICD-10-CM | POA: Diagnosis not present

## 2016-05-31 DIAGNOSIS — M25511 Pain in right shoulder: Secondary | ICD-10-CM | POA: Diagnosis not present

## 2016-06-04 DIAGNOSIS — R29898 Other symptoms and signs involving the musculoskeletal system: Secondary | ICD-10-CM | POA: Diagnosis not present

## 2016-06-04 DIAGNOSIS — M19011 Primary osteoarthritis, right shoulder: Secondary | ICD-10-CM | POA: Diagnosis not present

## 2016-06-04 DIAGNOSIS — M25511 Pain in right shoulder: Secondary | ICD-10-CM | POA: Diagnosis not present

## 2016-06-07 ENCOUNTER — Other Ambulatory Visit: Payer: Self-pay | Admitting: Orthopedic Surgery

## 2016-06-07 DIAGNOSIS — M25511 Pain in right shoulder: Secondary | ICD-10-CM

## 2016-06-16 DIAGNOSIS — I482 Chronic atrial fibrillation: Secondary | ICD-10-CM | POA: Diagnosis not present

## 2016-06-17 DIAGNOSIS — I340 Nonrheumatic mitral (valve) insufficiency: Secondary | ICD-10-CM | POA: Diagnosis not present

## 2016-06-17 DIAGNOSIS — R5383 Other fatigue: Secondary | ICD-10-CM | POA: Diagnosis not present

## 2016-06-17 DIAGNOSIS — I482 Chronic atrial fibrillation: Secondary | ICD-10-CM | POA: Diagnosis not present

## 2016-06-17 DIAGNOSIS — I5023 Acute on chronic systolic (congestive) heart failure: Secondary | ICD-10-CM | POA: Diagnosis not present

## 2016-06-18 ENCOUNTER — Ambulatory Visit: Payer: PPO

## 2016-06-21 ENCOUNTER — Ambulatory Visit
Admission: RE | Admit: 2016-06-21 | Discharge: 2016-06-21 | Disposition: A | Discharge status: Home / Self Care | Payer: PPO | Source: Ambulatory Visit | Attending: Orthopedic Surgery | Admitting: Orthopedic Surgery

## 2016-06-21 ENCOUNTER — Ambulatory Visit: Payer: PPO | Admitting: Psychiatry

## 2016-06-21 DIAGNOSIS — M19011 Primary osteoarthritis, right shoulder: Secondary | ICD-10-CM | POA: Insufficient documentation

## 2016-06-21 DIAGNOSIS — S43084A Other dislocation of right shoulder joint, initial encounter: Secondary | ICD-10-CM | POA: Insufficient documentation

## 2016-06-21 DIAGNOSIS — R29898 Other symptoms and signs involving the musculoskeletal system: Secondary | ICD-10-CM | POA: Insufficient documentation

## 2016-06-21 DIAGNOSIS — M75101 Unspecified rotator cuff tear or rupture of right shoulder, not specified as traumatic: Secondary | ICD-10-CM | POA: Diagnosis not present

## 2016-06-21 DIAGNOSIS — X58XXXA Exposure to other specified factors, initial encounter: Secondary | ICD-10-CM | POA: Diagnosis not present

## 2016-06-21 DIAGNOSIS — M25511 Pain in right shoulder: Secondary | ICD-10-CM | POA: Diagnosis not present

## 2016-06-25 DIAGNOSIS — I5023 Acute on chronic systolic (congestive) heart failure: Secondary | ICD-10-CM | POA: Diagnosis not present

## 2016-06-28 DIAGNOSIS — I482 Chronic atrial fibrillation: Secondary | ICD-10-CM | POA: Diagnosis not present

## 2016-06-30 DIAGNOSIS — I482 Chronic atrial fibrillation: Secondary | ICD-10-CM | POA: Diagnosis not present

## 2016-06-30 DIAGNOSIS — R0602 Shortness of breath: Secondary | ICD-10-CM | POA: Diagnosis not present

## 2016-06-30 DIAGNOSIS — Z7901 Long term (current) use of anticoagulants: Secondary | ICD-10-CM | POA: Diagnosis not present

## 2016-06-30 DIAGNOSIS — I5022 Chronic systolic (congestive) heart failure: Secondary | ICD-10-CM | POA: Diagnosis not present

## 2016-06-30 DIAGNOSIS — I5023 Acute on chronic systolic (congestive) heart failure: Secondary | ICD-10-CM | POA: Diagnosis not present

## 2016-07-02 ENCOUNTER — Telehealth: Payer: Self-pay

## 2016-07-02 DIAGNOSIS — M12811 Other specific arthropathies, not elsewhere classified, right shoulder: Secondary | ICD-10-CM | POA: Diagnosis not present

## 2016-07-02 DIAGNOSIS — M75121 Complete rotator cuff tear or rupture of right shoulder, not specified as traumatic: Secondary | ICD-10-CM | POA: Diagnosis not present

## 2016-07-02 NOTE — Telephone Encounter (Signed)
pt was given a month supply 4 box of deplin.  lot # [id number]  exp# 10/18

## 2016-07-02 NOTE — Telephone Encounter (Signed)
called asked if he can get enough sample to do until his mext appt.

## 2016-07-15 DIAGNOSIS — I5023 Acute on chronic systolic (congestive) heart failure: Secondary | ICD-10-CM | POA: Diagnosis not present

## 2016-07-15 DIAGNOSIS — I482 Chronic atrial fibrillation: Secondary | ICD-10-CM | POA: Diagnosis not present

## 2016-07-19 DIAGNOSIS — I420 Dilated cardiomyopathy: Secondary | ICD-10-CM | POA: Diagnosis not present

## 2016-07-19 DIAGNOSIS — R0602 Shortness of breath: Secondary | ICD-10-CM | POA: Diagnosis not present

## 2016-07-19 DIAGNOSIS — J439 Emphysema, unspecified: Secondary | ICD-10-CM | POA: Diagnosis not present

## 2016-07-21 DIAGNOSIS — I071 Rheumatic tricuspid insufficiency: Secondary | ICD-10-CM | POA: Diagnosis not present

## 2016-07-21 DIAGNOSIS — I340 Nonrheumatic mitral (valve) insufficiency: Secondary | ICD-10-CM | POA: Diagnosis not present

## 2016-07-21 DIAGNOSIS — I38 Endocarditis, valve unspecified: Secondary | ICD-10-CM | POA: Diagnosis not present

## 2016-07-21 DIAGNOSIS — I420 Dilated cardiomyopathy: Secondary | ICD-10-CM | POA: Diagnosis not present

## 2016-07-21 DIAGNOSIS — I5023 Acute on chronic systolic (congestive) heart failure: Secondary | ICD-10-CM | POA: Diagnosis not present

## 2016-07-21 DIAGNOSIS — I5022 Chronic systolic (congestive) heart failure: Secondary | ICD-10-CM | POA: Diagnosis not present

## 2016-07-21 DIAGNOSIS — I482 Chronic atrial fibrillation: Secondary | ICD-10-CM | POA: Diagnosis not present

## 2016-08-02 ENCOUNTER — Encounter: Payer: Self-pay | Admitting: Psychiatry

## 2016-08-02 ENCOUNTER — Ambulatory Visit (INDEPENDENT_AMBULATORY_CARE_PROVIDER_SITE_OTHER): Payer: PPO | Admitting: Psychiatry

## 2016-08-02 ENCOUNTER — Ambulatory Visit: Payer: PPO | Admitting: Psychiatry

## 2016-08-02 VITALS — BP 117/84 | HR 87

## 2016-08-02 DIAGNOSIS — R4189 Other symptoms and signs involving cognitive functions and awareness: Secondary | ICD-10-CM

## 2016-08-02 DIAGNOSIS — F330 Major depressive disorder, recurrent, mild: Secondary | ICD-10-CM

## 2016-08-02 MED ORDER — DEPLIN 15 15-90.314 MG PO CAPS: 15.0000 mg | ORAL_CAPSULE | ORAL | 0 refills | Status: DC

## 2016-08-02 MED ORDER — ESCITALOPRAM OXALATE 20 MG PO TABS: 20.0000 mg | ORAL_TABLET | ORAL | 1 refills | Status: DC

## 2016-08-02 NOTE — Progress Notes (Signed)
BH MD/PA/NP OP Progress Note  08/02/2016 10:26 AM David Duncan  MRN:  [id number]  Subjective:  Patient is a 83 year old male who presented for the follow-up accompanied by his wife. He was recently diagnosed with acute on chronic congestive heart failure. She was sitting in the wheelchair. His wife reported that she has taken him to the court-ordered clinic and has been following with a cardiologist and pulmonologist over there. I reviewed his records. Patient reported that he is having shortness of breath. He stated that he wants to be admitted to the hospital so he can get IV Lasix. He reported that he occasionally gets shortness of breath however he appeared calm and alert during the interview. He remains focused on his medications. He reported that he sits all day long. His wife reported that he does not do anything besides sitting in the chair throughout the day. He stated that he has been compliant with his medications. He is currently on X appropriate. He appears pleasant and cooperative during the interview. He denied having any suicidal homicidal ideations or plans. His wife is supportive and has been taking care of him.    He is also taking Deplin and reported that it has been helpful.   Chief Complaint:  Chief Complaint    Follow-up; Medication Refill     Visit Diagnosis:     ICD-9-CM ICD-10-CM   1. MDD (major depressive disorder), recurrent episode, mild (HCC) 296.31 F33.0   2. Cognitive decline 294.9 R41.89     Past Medical History:  Past Medical History:  Diagnosis Date  . Anxiety   . Atrial fibrillation ([place])   . Basal cell cancer 2008   on shoulder  . Bipolar disorder ([place])    hospitalized in past  . CVA (cerebral vascular accident) ([place]) 8/11  . Generalized anxiety disorder   . GERD (gastroesophageal reflux disease)   . History of kidney stones    30 years ago  . Macular degeneration   . Melanoma ([place])    resected from scalp approximately 1 year ago.   .  Osteoarthritis    in neck and left knee  . Tremor    noted when writing    Past Surgical History:  Procedure Laterality Date  . BACK SURGERY  1991  . CATARACT EXTRACTION W/ INTRAOCULAR LENS IMPLANT Bilateral   . KNEE ARTHROPLASTY Left 12/03/2015   Procedure: COMPUTER ASSISTED TOTAL KNEE ARTHROPLASTY;  Surgeon: Dereck Leep, MD;  Location: [facility] ORS;  Service: Orthopedics;  Laterality: Left;  . SKIN CANCER EXCISION     Family History:  Family History  Problem Relation Age of Onset  . Stroke Father   . Cancer Brother     prostate  . Coronary artery disease Brother   . Hypertension Brother    Social History:  Social History   Social History  . Marital status: Married    Spouse name: N/A  . Number of children: N/A  . Years of education: N/A   Occupational History  . retired     used to work on farm, Press photographer, Cabin crew   Social History Main Topics  . Smoking status: Never Smoker  . Smokeless tobacco: Never Used  . Alcohol use No  . Drug use: No  . Sexual activity: No   Other Topics Concern  . None   Social History Narrative   Moved from [place] 3/10   Walks 30-40 minutes daily   Additional History:   Atrial fibrillation (CMS-HCC)  . Bipolar  affective disorder (CMS-HCC)  . CVA (cerebral vascular accident) ([place]) 11/2009  . Depression  . Dilated cardiomyopathy (CMS-HCC)  . Dyslipidemia  . GAD (generalized anxiety disorder)  . GERD (gastroesophageal reflux disease)  . History of basal cell cancer 2008  shoulder  . History of kidney stones  . Macular degeneration  . Melanoma (CMS-HCC)  on Scalp  . Osteoarthritis  . Stroke (CMS-HCC)  11/2009  . VHD (valvular heart disease)       Assessment:    Musculoskeletal: Strength & Muscle Tone: within normal limits Gait & Station: normal Patient leans: N/A  Psychiatric Specialty Exam: Medication Refill   Anxiety  Patient reports no insomnia, nervous/anxious behavior or suicidal ideas.     Depression         Associated symptoms include does not have insomnia and no suicidal ideas.  Past medical history includes anxiety.     Review of Systems  Psychiatric/Behavioral: Negative for depression, hallucinations, memory loss, substance abuse and suicidal ideas. The patient is not nervous/anxious and does not have insomnia.   All other systems reviewed and are negative.   Blood pressure 117/84, pulse 87.There is no height or weight on file to calculate BMI.  General Appearance: Well Groomed and slow gait  Eye Contact:  Good  Speech:  Normal Rate and Slow  Volume:  Normal  Mood:  Anxious  Affect:  Appropriate  Thought Process:  Linear and Logical  Orientation:  Full (Time, Place, and Person)  Thought Content:  Negative  Suicidal Thoughts:  No  Homicidal Thoughts:  No  Memory:  Immediate;   Good Recent;   Good Remote;   Good  Judgement:  Good  Insight:  Good  Psychomotor Activity:  Normal  Concentration:  Good  Recall:  Good  Fund of Knowledge: Good  Language: Good  Akathisia:  Negative  Handed:  Right unknown  AIMS (if indicated):  No done  Assets:  Communication Skills Desire for Improvement  ADL's:  Intact  Cognition: WNL  Sleep:  good   Is the patient at risk to self?  No. Has the patient been a risk to self in the past 6 months?  No. Has the patient been a risk to self within the distant past?  No. Is the patient a risk to others?  No. Has the patient been a risk to others in the past 6 months?  No. Has the patient been a risk to others within the distant past?  No.  Current Medications: Current Outpatient Prescriptions  Medication Sig Dispense Refill  . ARIPiprazole (ABILIFY) 2 MG tablet Take 1 tablet (2 mg total) by mouth daily. 30 tablet 1  . Ascorbic Acid (VITAMIN C) 500 MG tablet Take 500 mg by mouth 2 (two) times daily.     . B Complex Vitamins (B COMPLEX PO) Take by mouth 2 (two) times daily.     . Cholecalciferol (VITAMIN D-3) 5000 UNITS TABS  Take 1 tablet by mouth daily.     . Cyanocobalamin (VITAMIN B-12) 2500 MCG SUBL Take 2 tabs by mouth daily     . escitalopram (LEXAPRO) 10 MG tablet Take 1.5 tablets (15 mg total) by mouth every morning. 135 tablet 0  . L-Methylfolate-Algae (DEPLIN 15) 15-90.314 MG CAPS Take 15 mg by mouth every morning. 28 capsule 0  . Lutein 6 MG CAPS Take 6 mg by mouth daily.     . metoprolol tartrate (LOPRESSOR) 25 MG tablet Take 25 mg by mouth 2 (two) times daily.     [name] Kitchen  pyridOXINE (VITAMIN B-6) 25 MG tablet Take 25 mg by mouth 2 (two) times daily.     . tamsulosin (FLOMAX) 0.4 MG CAPS capsule Take 1 capsule (0.4 mg total) by mouth daily. 30 capsule 11  . warfarin (COUMADIN) 2.5 MG tablet Take 2.5 mg by mouth daily at 6 PM. Take 1 tablet daily 5 days a week and on the 6th day take 2 tablets    . warfarin (COUMADIN) 5 MG tablet Use as directed by anticoagulation clinic (Patient taking differently: Use as directed by anticoagulation clinic every 6th day) 90 tablet 1   No current facility-administered medications for this visit.     Medical Decision Making:  Established Problem, Stable/Improving (1)  Treatment Plan Summary:Medication management We discussed at length about the medications treatment risks benefits and alternatives  Continue Lexapro 10 mg daily. Will start him on Deplin 15 mg one pill daily and he was given samples for the next 5  weeks.  Advised him to call if he notices worsening of his symptoms and he demonstrated understanding.  Follow-up in 2 months or earlier depending on his symptoms   More than 50% of the time spent in psychoeducation, counseling and coordination of care.    This note was generated in part or whole with voice recognition software. Voice regonition is usually quite accurate but there are transcription errors that can and very often do occur. I apologize for any typographical errors that were not detected and corrected.     Rainey Pines, MD  08/02/2016, 10:26  AM

## 2016-08-04 DIAGNOSIS — I5023 Acute on chronic systolic (congestive) heart failure: Secondary | ICD-10-CM | POA: Diagnosis not present

## 2016-08-04 DIAGNOSIS — I071 Rheumatic tricuspid insufficiency: Secondary | ICD-10-CM | POA: Diagnosis not present

## 2016-08-04 DIAGNOSIS — I5022 Chronic systolic (congestive) heart failure: Secondary | ICD-10-CM | POA: Diagnosis not present

## 2016-08-04 DIAGNOSIS — I482 Chronic atrial fibrillation: Secondary | ICD-10-CM | POA: Diagnosis not present

## 2016-08-04 DIAGNOSIS — I340 Nonrheumatic mitral (valve) insufficiency: Secondary | ICD-10-CM | POA: Diagnosis not present

## 2016-08-04 DIAGNOSIS — I420 Dilated cardiomyopathy: Secondary | ICD-10-CM | POA: Diagnosis not present

## 2016-08-04 DIAGNOSIS — I38 Endocarditis, valve unspecified: Secondary | ICD-10-CM | POA: Diagnosis not present

## 2016-08-04 DIAGNOSIS — I639 Cerebral infarction, unspecified: Secondary | ICD-10-CM | POA: Diagnosis not present

## 2016-08-10 ENCOUNTER — Ambulatory Visit: Payer: PPO | Attending: Family | Admitting: Family

## 2016-08-10 ENCOUNTER — Encounter: Payer: Self-pay | Admitting: Family

## 2016-08-10 VITALS — BP 139/88 | HR 92 | Resp 18 | Ht 66.0 in | Wt 170.0 lb

## 2016-08-10 DIAGNOSIS — Z9889 Other specified postprocedural states: Secondary | ICD-10-CM | POA: Diagnosis not present

## 2016-08-10 DIAGNOSIS — Z8042 Family history of malignant neoplasm of prostate: Secondary | ICD-10-CM | POA: Diagnosis not present

## 2016-08-10 DIAGNOSIS — Z8249 Family history of ischemic heart disease and other diseases of the circulatory system: Secondary | ICD-10-CM | POA: Insufficient documentation

## 2016-08-10 DIAGNOSIS — Z85820 Personal history of malignant melanoma of skin: Secondary | ICD-10-CM | POA: Diagnosis not present

## 2016-08-10 DIAGNOSIS — Z823 Family history of stroke: Secondary | ICD-10-CM | POA: Insufficient documentation

## 2016-08-10 DIAGNOSIS — M1990 Unspecified osteoarthritis, unspecified site: Secondary | ICD-10-CM | POA: Diagnosis not present

## 2016-08-10 DIAGNOSIS — Z87442 Personal history of urinary calculi: Secondary | ICD-10-CM | POA: Diagnosis not present

## 2016-08-10 DIAGNOSIS — R000 Tachycardia, unspecified: Secondary | ICD-10-CM | POA: Diagnosis not present

## 2016-08-10 DIAGNOSIS — Z7901 Long term (current) use of anticoagulants: Secondary | ICD-10-CM | POA: Diagnosis not present

## 2016-08-10 DIAGNOSIS — I4891 Unspecified atrial fibrillation: Secondary | ICD-10-CM | POA: Diagnosis not present

## 2016-08-10 DIAGNOSIS — H3530 Unspecified macular degeneration: Secondary | ICD-10-CM | POA: Diagnosis not present

## 2016-08-10 DIAGNOSIS — Z8673 Personal history of transient ischemic attack (TIA), and cerebral infarction without residual deficits: Secondary | ICD-10-CM | POA: Insufficient documentation

## 2016-08-10 DIAGNOSIS — Z9841 Cataract extraction status, right eye: Secondary | ICD-10-CM | POA: Diagnosis not present

## 2016-08-10 DIAGNOSIS — Z9842 Cataract extraction status, left eye: Secondary | ICD-10-CM | POA: Diagnosis not present

## 2016-08-10 DIAGNOSIS — K219 Gastro-esophageal reflux disease without esophagitis: Secondary | ICD-10-CM | POA: Diagnosis not present

## 2016-08-10 DIAGNOSIS — F419 Anxiety disorder, unspecified: Secondary | ICD-10-CM | POA: Insufficient documentation

## 2016-08-10 DIAGNOSIS — F319 Bipolar disorder, unspecified: Secondary | ICD-10-CM | POA: Insufficient documentation

## 2016-08-10 DIAGNOSIS — I5022 Chronic systolic (congestive) heart failure: Secondary | ICD-10-CM | POA: Diagnosis not present

## 2016-08-10 DIAGNOSIS — I482 Chronic atrial fibrillation, unspecified: Secondary | ICD-10-CM

## 2016-08-10 NOTE — Patient Instructions (Addendum)
Begin weighing daily and call for an overnight weight gain of > 2 pounds or a weekly weight gain of >5 pounds.  Keep fluid intake to 40-50 ounces daily.   Check mg dose on furosemide bottle. If it's 20mg  tablets, take 2 tablets daily. If it's something different, call back.

## 2016-08-10 NOTE — Progress Notes (Signed)
Patient ID: David Duncan, male    DOB: 1930/05/04, 83 y.o.   MRN: [id number]  HPI  David Duncan is a 83 y/o male with a history of atrial fibrillation, basal cell cancer, bipolar, CVA, anxiety, GERD, macular degeneration, melanoma, osteoarthritis and chronic heart failure.   Last echo was done 07/15/16 and showed an EF of 20% along with mild AR and severe David/TR.  Patient has not been admitted in the last year.  He presents today for his initial visit with a chief complaint of moderate shortness of breath with minimal exertion. He describes it as worsening over the last several months and can occur with little exertion and occasionally at rest. He has associated fatigue, pedal edema and lightheadedness.   Past Medical History:  Diagnosis Date  . Anxiety   . Atrial fibrillation ([place])   . Basal cell cancer 2008   on shoulder  . Bipolar disorder ([place])    hospitalized in past  . CVA (cerebral vascular accident) ([place]) 8/11  . Generalized anxiety disorder   . GERD (gastroesophageal reflux disease)   . History of kidney stones    30 years ago  . Macular degeneration   . Melanoma ([place])    resected from scalp approximately 1 year ago.   . Osteoarthritis    in neck and left knee  . Tremor    noted when writing   Past Surgical History:  Procedure Laterality Date  . BACK SURGERY  1991  . CATARACT EXTRACTION W/ INTRAOCULAR LENS IMPLANT Bilateral   . KNEE ARTHROPLASTY Left 12/03/2015   Procedure: COMPUTER ASSISTED TOTAL KNEE ARTHROPLASTY;  Surgeon: Dereck Leep, MD;  Location: [facility] ORS;  Service: Orthopedics;  Laterality: Left;  . SKIN CANCER EXCISION     Family History  Problem Relation Age of Onset  . Stroke Father   . Cancer Brother     prostate  . Coronary artery disease Brother   . Hypertension Brother    Social History  Substance Use Topics  . Smoking status: Never Smoker  . Smokeless tobacco: Never Used  . Alcohol use No   No Known Allergies Prior to Admission  medications   Medication Sig Start Date End Date Taking? Authorizing Provider  Ascorbic Acid (VITAMIN C) 500 MG tablet Take 500 mg by mouth 2 (two) times daily.    Yes Historical Provider, MD  B Complex Vitamins (B COMPLEX PO) Take by mouth 2 (two) times daily.    Yes Historical Provider, MD  Cholecalciferol (VITAMIN D-3) 5000 UNITS TABS Take 1 tablet by mouth daily.    Yes Historical Provider, MD  Cyanocobalamin (VITAMIN B-12) 2500 MCG SUBL Take 2 tabs by mouth daily    Yes Historical Provider, MD  escitalopram (LEXAPRO) 20 MG tablet Take 1 tablet (20 mg total) by mouth every morning. 08/02/16  Yes Rainey Pines, MD  furosemide (LASIX) 20 MG tablet Take 20 mg by mouth daily.   Yes Historical Provider, MD  L-Methylfolate-Algae (DEPLIN 15) 15-90.314 MG CAPS Take 15 mg by mouth every morning. 08/02/16  Yes Rainey Pines, MD  Lutein 6 MG CAPS Take 6 mg by mouth daily.    Yes Historical Provider, MD  metoprolol tartrate (LOPRESSOR) 25 MG tablet Take 50 mg by mouth 2 (two) times daily.  08/05/15  Yes Historical Provider, MD  pyridOXINE (VITAMIN B-6) 25 MG tablet Take 25 mg by mouth 1 (one) time daily.    Yes Historical Provider, MD  tamsulosin (FLOMAX) 0.4 MG CAPS capsule Take  1 capsule (0.4 mg total) by mouth daily. 02/17/16  Yes Abner Greenspan, MD  warfarin (COUMADIN) 2.5 MG tablet Take 2.5 mg by mouth. Take 1 tablet daily 4 days a week and the other 3 days, he takes 5 mg 09/10/14  Yes Historical Provider, MD    Review of Systems  Constitutional: Positive for fatigue. Negative for appetite change.  HENT: Negative for congestion, postnasal drip and sore throat.   Eyes: Negative.   Respiratory: Positive for shortness of breath. Negative for chest tightness.   Cardiovascular: Positive for leg swelling. Negative for chest pain and palpitations.  Endocrine: Negative.   Genitourinary: Negative.   Musculoskeletal: Positive for arthralgias (right shoulder at times) and back pain (at times).  Skin: Negative.    Allergic/Immunologic: Negative.   Neurological: Positive for light-headedness (with sudden position changes). Negative for dizziness.  Hematological: Negative for adenopathy. Does not bruise/bleed easily.  Psychiatric/Behavioral: Positive for sleep disturbance (not sleeping well (unknown why); sleeping in recliner with 1 pillow). Negative for dysphoric mood. The patient is not nervous/anxious.    Vitals:   08/10/16 1103  BP: 139/88  Pulse: 92  Resp: 18  SpO2: 98%  Weight: 170 lb (77.1 kg)  Height: 5\' 6"  (1.676 m)   Wt Readings from Last 3 Encounters:  08/10/16 170 lb (77.1 kg)  05/26/16 167 lb 12 oz (76.1 kg)  04/02/16 160 lb (72.6 kg)   Lab Results  Component Value Date   CREATININE 0.99 02/06/2016   CREATININE 1.07 12/31/2015   CREATININE 0.95 12/05/2015    Physical Exam  Constitutional: He is oriented to person, place, and time. He appears well-developed and well-nourished.  HENT:  Head: Normocephalic and atraumatic.  Neck: Normal range of motion. Neck supple. No JVD present.  Cardiovascular: An irregular rhythm present. Tachycardia present.   Pulmonary/Chest: Effort normal. He has no wheezes. He has no rales.  Abdominal: Soft. He exhibits no distension. There is no tenderness.  Musculoskeletal: He exhibits edema (2+ pitting edema in right lower leg/1+ pitting edema in left lower leg). He exhibits no tenderness.  Neurological: He is alert and oriented to person, place, and time.  Skin: Skin is warm and dry.  Psychiatric: He has a normal mood and affect. His behavior is normal.  Vitals reviewed.   Assessment & Plan:  1: Chronic heart failure with reduced ejection fraction- - NYHA class III - mildly fluid overloaded today - not weighing daily; instructed to weigh every morning after using the bathroom and call for an overnight weight gain of >2 pounds or a weekly weight gain of >5 pounds. Does have scales at home - does add a few "sprinkles" of salt to his food as he  eats all organic food and very little processed. Discussed the importance of closely following a 2000mg  sodium diet. Written dietary information was given to him about this. - unsure of fluid intake. Discussed keeping his fluid intake to between 40-50 ounces of fluid daily - elevate legs as much as possible - unsure of how much diuretic he is taking. He thinks it's 20mg  furosemide daily. Advised him to take 2 of those 20mg  tablets daily until he returns or until he loses 5-6 pounds - discussed adding entresto at his next visit but need to get accurate medication list first - BMP on 07/21/16 shows potassium 4.0, sodium 140 and GFR 48 - saw cardiologist Nehemiah Massed) 08/04/16  2: Atrial fibrillation- - tachycardic today - on metoprolol and warfarin  Patient did not bring his  medications nor a list. Each medication was verbally reviewed with the patient and he was encouraged to bring the bottles to every visit to confirm accuracy of list. Will call pharmacy to get medication list faxed to [place].   Return in 2 weeks or sooner for any questions/problems before then. Will check a BMP at that time as well.

## 2016-08-26 ENCOUNTER — Ambulatory Visit: Payer: PPO | Attending: Family | Admitting: Family

## 2016-08-26 VITALS — BP 126/80 | HR 92 | Resp 20 | Ht 66.0 in | Wt 169.2 lb

## 2016-08-26 DIAGNOSIS — F419 Anxiety disorder, unspecified: Secondary | ICD-10-CM | POA: Insufficient documentation

## 2016-08-26 DIAGNOSIS — M1990 Unspecified osteoarthritis, unspecified site: Secondary | ICD-10-CM | POA: Insufficient documentation

## 2016-08-26 DIAGNOSIS — H3530 Unspecified macular degeneration: Secondary | ICD-10-CM | POA: Insufficient documentation

## 2016-08-26 DIAGNOSIS — I4891 Unspecified atrial fibrillation: Secondary | ICD-10-CM | POA: Insufficient documentation

## 2016-08-26 DIAGNOSIS — K219 Gastro-esophageal reflux disease without esophagitis: Secondary | ICD-10-CM | POA: Insufficient documentation

## 2016-08-26 DIAGNOSIS — F319 Bipolar disorder, unspecified: Secondary | ICD-10-CM | POA: Insufficient documentation

## 2016-08-26 DIAGNOSIS — Z79899 Other long term (current) drug therapy: Secondary | ICD-10-CM | POA: Diagnosis not present

## 2016-08-26 DIAGNOSIS — Z85820 Personal history of malignant melanoma of skin: Secondary | ICD-10-CM | POA: Diagnosis not present

## 2016-08-26 DIAGNOSIS — Z7901 Long term (current) use of anticoagulants: Secondary | ICD-10-CM | POA: Diagnosis not present

## 2016-08-26 DIAGNOSIS — Z8673 Personal history of transient ischemic attack (TIA), and cerebral infarction without residual deficits: Secondary | ICD-10-CM | POA: Diagnosis not present

## 2016-08-26 DIAGNOSIS — I5022 Chronic systolic (congestive) heart failure: Secondary | ICD-10-CM | POA: Diagnosis not present

## 2016-08-26 DIAGNOSIS — I509 Heart failure, unspecified: Secondary | ICD-10-CM | POA: Diagnosis not present

## 2016-08-26 DIAGNOSIS — I482 Chronic atrial fibrillation, unspecified: Secondary | ICD-10-CM

## 2016-08-26 MED ORDER — SACUBITRIL-VALSARTAN 24-26 MG PO TABS: 1.0000 | ORAL_TABLET | Freq: Two times a day (BID) | ORAL | 3 refills | Status: DC

## 2016-08-26 NOTE — Progress Notes (Signed)
Patient ID: David Duncan, male    DOB: February 16, 1931, 83 y.o.   MRN: [id number]  HPI  [name] is a 83 y/o male with a history of atrial fibrillation, basal cell cancer, bipolar, CVA, anxiety, GERD, macular degeneration, melanoma, osteoarthritis and chronic heart failure.   Last echo was done 07/15/16 and showed an EF of 20% along with mild AR and severe MR/TR.  Patient has not been admitted in the last year.  He presents today for his follow-up visit with a chief complaint of moderate shortness of breath with minimal exertion. He describes it as worsening over the last several months and can occur with little exertion and occasionally at rest. He has associated fatigue, pedal edema and lightheadedness along with this. Has had diarrhea with increased furosemide dose.   Past Medical History:  Diagnosis Date  . Anxiety   . Atrial fibrillation ([place])   . Basal cell cancer 2008   on shoulder  . Bipolar disorder ([place])    hospitalized in past  . CVA (cerebral vascular accident) ([place]) 8/11  . Generalized anxiety disorder   . GERD (gastroesophageal reflux disease)   . History of kidney stones    30 years ago  . Macular degeneration   . Melanoma ([place])    resected from scalp approximately 1 year ago.   . Osteoarthritis    in neck and left knee  . Tremor    noted when writing   Past Surgical History:  Procedure Laterality Date  . BACK SURGERY  1991  . CATARACT EXTRACTION W/ INTRAOCULAR LENS IMPLANT Bilateral   . KNEE ARTHROPLASTY Left 12/03/2015   Procedure: COMPUTER ASSISTED TOTAL KNEE ARTHROPLASTY;  Surgeon: Dereck Leep, MD;  Location: [facility] ORS;  Service: Orthopedics;  Laterality: Left;  . SKIN CANCER EXCISION     Family History  Problem Relation Age of Onset  . Stroke Father   . Cancer Brother        prostate  . Coronary artery disease Brother   . Hypertension Brother    Social History  Substance Use Topics  . Smoking status: Never Smoker  . Smokeless tobacco: Never  Used  . Alcohol use No   No Known Allergies  Prior to Admission medications   Medication Sig Start Date End Date Taking? Authorizing Provider  Acetaminophen 500 MG coapsule Take 1 capsule by mouth every 4 (four) hours as needed for fever.   Yes [provider]  Amino Acids (FREE FORM AMINO ACID COMPLEX PO) Take 1 capsule by mouth daily.   Yes [provider]  Calcium-Magnesium-Vitamin D (CALCIUM 500 PO) Take 2 tablets by mouth daily.   Yes [provider]  diclofenac sodium (VOLTAREN) 1 % GEL Apply 2 g topically as needed.   Yes [provider]  DIPHENHYDRAMINE HCL, SLEEP, PO Take 30 mLs by mouth as needed (Sleep).   Yes [provider]  escitalopram (LEXAPRO) 20 MG tablet Take 1 tablet (20 mg total) by mouth every morning. 08/02/16  Yes Rainey Pines, MD  furosemide (LASIX) 20 MG tablet Take 40 mg by mouth daily.    Yes [provider]  L-Methylfolate-Algae (DEPLIN 15) 15-90.314 MG CAPS Take 15 mg by mouth every morning. 08/02/16  Yes Rainey Pines, MD  levalbuterol [facility] HFA) 45 MCG/ACT inhaler Inhale 2 puffs into the lungs every 4 (four) hours as needed for wheezing.   Yes [provider]  loperamide (IMODIUM) 2 MG capsule Take 2 mg by mouth as needed for  diarrhea or loose stools.   Yes [provider]  metoprolol tartrate (LOPRESSOR) 25 MG tablet Take 50 mg by mouth 2 (two) times daily.  08/05/15  Yes [provider]  Multiple Vitamin (MULTIVITAMIN) capsule Take 1 capsule by mouth daily.   Yes [provider]  NON FORMULARY Take 2 capsules by mouth daily.   Yes [provider]  Specialty Vitamins Products (ICAPS LUTEIN-ZEAXANTHIN PO) Take 2 capsules by mouth daily.   Yes [provider]  Specialty Vitamins Products (PROSTATE PO) Take 1 tablet by mouth 2 (two) times daily.   Yes [provider]  warfarin (COUMADIN) 2.5 MG tablet Take 2.5 mg by mouth. Take 1 tablet daily 4 days a  week and the other 3 days, he takes 5 mg 09/10/14  Yes [provider]  Ascorbic Acid (VITAMIN C) 500 MG tablet Take 500 mg by mouth 2 (two) times daily.     [provider]  B Complex Vitamins (B COMPLEX PO) Take by mouth 2 (two) times daily.     [provider]  Cholecalciferol (VITAMIN D-3) 5000 UNITS TABS Take 1 tablet by mouth daily.     [provider]  Cyanocobalamin (VITAMIN B-12) 2500 MCG SUBL Take 2 tabs by mouth daily     [provider]  Lutein 6 MG CAPS Take 6 mg by mouth daily.     [provider]  pyridOXINE (VITAMIN B-6) 25 MG tablet Take 25 mg by mouth daily.     [provider]    Review of Systems  Constitutional: Positive for fatigue. Negative for appetite change.  HENT: Negative for congestion, postnasal drip and sore throat.   Eyes: Negative.   Respiratory: Positive for shortness of breath. Negative for cough and chest tightness.   Cardiovascular: Positive for leg swelling. Negative for chest pain and palpitations.  Gastrointestinal: Positive for diarrhea. Negative for abdominal distention and abdominal pain.  Endocrine: Negative.   Genitourinary: Negative.   Musculoskeletal: Positive for arthralgias (right shoulder at times) and back pain (at times).  Skin: Negative.   Allergic/Immunologic: Negative.   Neurological: Positive for light-headedness (with sudden position changes). Negative for dizziness.  Hematological: Negative for adenopathy. Does not bruise/bleed easily.  Psychiatric/Behavioral: Positive for sleep disturbance (not sleeping well (unknown why); sleeping in recliner with 1 pillow). Negative for dysphoric mood. The patient is not nervous/anxious.    Vitals:   08/26/16 1324  BP: 126/80  Pulse: 92  Resp: 20  SpO2: 97%  Weight: 169 lb 4 oz (76.8 kg)  Height: 5\' 6"  (1.676 m)   Wt Readings from Last 3 Encounters:  08/26/16 169 lb 4 oz (76.8 kg)  08/10/16 170 lb (77.1 kg)  05/26/16 167 lb  12 oz (76.1 kg)    Lab Results  Component Value Date   CREATININE 0.99 02/06/2016   CREATININE 1.07 12/31/2015   CREATININE 0.95 12/05/2015    Physical Exam  Constitutional: He is oriented to person, place, and time. He appears well-developed and well-nourished.  HENT:  Head: Normocephalic and atraumatic.  Neck: Normal range of motion. Neck supple. No JVD present.  Cardiovascular: An irregular rhythm present. Tachycardia present.   Pulmonary/Chest: Effort normal. He has no wheezes. He has no rales.  Abdominal: Soft. He exhibits no distension. There is no tenderness.  Musculoskeletal: He exhibits edema (1+ pitting edema in bilateral lower legs). He exhibits no tenderness.  Neurological: He is alert and oriented to person, place, and time.  Skin: Skin is warm  and dry.  Psychiatric: He has a normal mood and affect. His behavior is normal.  Vitals reviewed.   Assessment & Plan:  1: Chronic heart failure with reduced ejection fraction- - NYHA class III - mildly fluid overloaded today - weighing daily; reminded to weigh every morning after using the bathroom and call for an overnight weight gain of >2 pounds or a weekly weight gain of >5 pounds.  - does add a few "sprinkles" of salt to his food as he eats all organic food and very little processed. Discussed the importance of closely following a 2000mg  sodium diet.  - unsure of fluid intake. Discussed keeping his fluid intake to between 40-50 ounces of fluid daily - elevate legs as much as possible - he feels like his edema is better since taking the 40mg  furosemide daily - begin entresto 24/26mg  twice daily; when he starts this medication, he is to decrease his furosemide back to 20mg  daily - BMP drawn today 08/26/16 shows potassium 4.7, sodium 140 and GFR 48 - saw cardiologist David Duncan) 08/04/16  2: Atrial fibrillation- - slightly tachycardic today - on metoprolol and warfarin  Medication bottles were reviewed  Return in 1  week or sooner for any questions/problems before then. Will check a BMP in about a month.

## 2016-08-26 NOTE — Patient Instructions (Addendum)
Continue weighing daily and call for an overnight weight gain of > 2 pounds or a weekly weight gain of >5 pounds.  Begin entresto 24/26mg  twice daily. When you begin the entresto, decrease your furosemide back to 20mg  daily.

## 2016-08-27 ENCOUNTER — Encounter: Payer: Self-pay | Admitting: Family

## 2016-09-02 ENCOUNTER — Encounter: Payer: Self-pay | Admitting: Family

## 2016-09-02 ENCOUNTER — Ambulatory Visit: Payer: PPO | Attending: Family | Admitting: Family

## 2016-09-02 VITALS — BP 123/74 | HR 95 | Resp 20 | Ht 66.0 in | Wt 168.0 lb

## 2016-09-02 DIAGNOSIS — I4891 Unspecified atrial fibrillation: Secondary | ICD-10-CM | POA: Insufficient documentation

## 2016-09-02 DIAGNOSIS — I482 Chronic atrial fibrillation, unspecified: Secondary | ICD-10-CM

## 2016-09-02 DIAGNOSIS — R45851 Suicidal ideations: Secondary | ICD-10-CM | POA: Insufficient documentation

## 2016-09-02 DIAGNOSIS — I340 Nonrheumatic mitral (valve) insufficiency: Secondary | ICD-10-CM | POA: Diagnosis not present

## 2016-09-02 DIAGNOSIS — I5022 Chronic systolic (congestive) heart failure: Secondary | ICD-10-CM | POA: Insufficient documentation

## 2016-09-02 DIAGNOSIS — R600 Localized edema: Secondary | ICD-10-CM | POA: Diagnosis not present

## 2016-09-02 NOTE — Progress Notes (Signed)
Patient ID: David Duncan, male    DOB: 10-04-1930, 83 y.o.   MRN: [id number]  HPI  David Duncan is a 83 y/o male with a history of atrial fibrillation, basal cell cancer, bipolar, CVA, anxiety, GERD, macular degeneration, melanoma, osteoarthritis and chronic heart failure.   Last echo was done 07/15/16 and showed an EF of 20% along with mild AR and severe David/TR.  Patient has not been admitted in the last year.  He presents today for his follow-up visit with a chief complaint of right sided arm and leg swelling. He noted the change within the last 48 hours. There has been no changes in food or medication other than starting the Entresto last visit. He does have a bruise on his right upper arm. His shortness of breath has been slightly better. He still has dizziness and lightheadedness.   Past Medical History:  Diagnosis Date  . Anxiety   . Atrial fibrillation ([place])   . Basal cell cancer 2008   on shoulder  . Bipolar disorder ([place])    hospitalized in past  . CVA (cerebral vascular accident) ([place]) 8/11  . Generalized anxiety disorder   . GERD (gastroesophageal reflux disease)   . History of kidney stones    30 years ago  . Macular degeneration   . Melanoma ([place])    resected from scalp approximately 1 year ago.   . Osteoarthritis    in neck and left knee  . Tremor    noted when writing   Past Surgical History:  Procedure Laterality Date  . BACK SURGERY  1991  . CATARACT EXTRACTION W/ INTRAOCULAR LENS IMPLANT Bilateral   . KNEE ARTHROPLASTY Left 12/03/2015   Procedure: COMPUTER ASSISTED TOTAL KNEE ARTHROPLASTY;  Surgeon: Dereck Leep, MD;  Location: [facility] ORS;  Service: Orthopedics;  Laterality: Left;  . SKIN CANCER EXCISION     Family History  Problem Relation Age of Onset  . Stroke Father   . Cancer Brother        prostate  . Coronary artery disease Brother   . Hypertension Brother    Social History  Substance Use Topics  . Smoking status: Never Smoker  . Smokeless  tobacco: Never Used  . Alcohol use No   No Known Allergies  Prior to Admission medications   Medication Sig Start Date End Date Taking? Authorizing Provider  Acetaminophen 500 MG coapsule Take 1 capsule by mouth every 4 (four) hours as needed for fever.   Yes [provider]  Amino Acids (FREE FORM AMINO ACID COMPLEX PO) Take 1 capsule by mouth daily.   Yes [provider]  Ascorbic Acid (VITAMIN C) 500 MG tablet Take 500 mg by mouth 2 (two) times daily.    Yes [provider]  B Complex Vitamins (B COMPLEX PO) Take by mouth 2 (two) times daily.    Yes [provider]  Calcium-Magnesium-Vitamin D (CALCIUM 500 PO) Take 2 tablets by mouth daily.   Yes [provider]  Cholecalciferol (VITAMIN D-3) 5000 UNITS TABS Take 1 tablet by mouth daily.    Yes [provider]  Cyanocobalamin (VITAMIN B-12) 2500 MCG SUBL Take 2 tabs by mouth daily    Yes [provider]  diclofenac sodium (VOLTAREN) 1 % GEL Apply 2 g topically as needed.   Yes [provider]  DIPHENHYDRAMINE HCL, SLEEP, PO Take 30 mLs by mouth as needed (Sleep).   Yes [provider]  escitalopram (LEXAPRO) 20 MG tablet Take  1 tablet (20 mg total) by mouth every morning. 08/02/16  Yes Rainey Pines, MD  furosemide (LASIX) 20 MG tablet Take 20 mg by mouth daily.    Yes [provider]  L-Methylfolate-Algae (DEPLIN 15) 15-90.314 MG CAPS Take 15 mg by mouth every morning. 08/02/16  Yes Rainey Pines, MD  levalbuterol [facility] HFA) 45 MCG/ACT inhaler Inhale 2 puffs into the lungs every 4 (four) hours as needed for wheezing.   Yes [provider]  loperamide (IMODIUM) 2 MG capsule Take 2 mg by mouth as needed for diarrhea or loose stools.   Yes [provider]  Lutein 6 MG CAPS Take 6 mg by mouth daily.    Yes [provider]  metoprolol tartrate (LOPRESSOR) 25 MG tablet Take 50 mg by mouth 2 (two) times daily.  08/05/15  Yes [provider]  Multiple Vitamin (MULTIVITAMIN) capsule Take 1 capsule by mouth daily.   Yes [provider]  NON FORMULARY Take 2 capsules by mouth daily.   Yes [provider]  pyridOXINE (VITAMIN B-6) 25 MG tablet Take 25 mg by mouth daily.    Yes [provider]  sacubitril-valsartan (ENTRESTO) 24-26 MG Take 1 tablet by mouth 2 (two) times daily. 08/26/16  Yes Alisa Graff, FNP  Specialty Vitamins Products (ICAPS LUTEIN-ZEAXANTHIN PO) Take 2 capsules by mouth daily.   Yes [provider]  Specialty Vitamins Products (PROSTATE PO) Take 1 tablet by mouth 2 (two) times daily.   Yes [provider]  warfarin (COUMADIN) 2.5 MG tablet Take 2.5 mg by mouth. Take 1 tablet daily 4 days a week and the other 3 days, he takes 5 mg 09/10/14  Yes [provider]    Review of Systems  Constitutional: Positive for fatigue. Negative for appetite change.  HENT: Negative for congestion, postnasal drip and sore throat.   Eyes: Negative.   Respiratory: Positive for shortness of breath. Negative for cough and chest tightness.   Cardiovascular: Positive for leg swelling (2+ pitting edema in the left leg and 3+ pitting edema in the right leg). Negative for chest pain and palpitations.  Gastrointestinal: Negative for abdominal distention and diarrhea.  Endocrine: Negative.   Genitourinary: Negative.   Musculoskeletal: Negative for arthralgias (right shoulder at times) and back pain (at times).  Skin: Negative.   Allergic/Immunologic: Negative.   Neurological: Positive for dizziness and light-headedness (with sudden position changes). Negative for numbness.  Hematological: Negative for adenopathy. Does not bruise/bleed easily.  Psychiatric/Behavioral: Positive for sleep disturbance (not sleeping well (unknown why); sleeping in recliner with 1 pillow). Negative for dysphoric mood. The patient is not nervous/anxious.    Vitals:   09/02/16 1228  BP: 123/74   Pulse: 95  Resp: 20  SpO2: 94%  Weight: 168 lb (76.2 kg)  Height: 5\' 6"  (1.676 m)   Wt Readings from Last 3 Encounters:  09/02/16 168 lb (76.2 kg)  08/26/16 169 lb 4 oz (76.8 kg)  08/10/16 170 lb (77.1 kg)    Lab Results  Component Value Date   CREATININE 0.99 02/06/2016   CREATININE 1.07 12/31/2015   CREATININE 0.95 12/05/2015    Physical Exam  Constitutional: He is oriented to person, place, and time. He appears well-developed and well-nourished.  HENT:  Head: Normocephalic and atraumatic.  Neck: Normal range of motion. Neck supple. No JVD present.  Cardiovascular: An irregular rhythm present. Tachycardia present.   Pulmonary/Chest: Effort normal. He has no wheezes. He has no rales.  Abdominal: Soft. He  exhibits no distension. There is no tenderness.  Musculoskeletal: He exhibits edema (2+ pitting edema in left lower leg and 3+ pitting edema in right lower leg). He exhibits no tenderness.  Edema in right lower arm  Neurological: He is alert and oriented to person, place, and time.  + radial pulses bilaterally  Skin: Skin is warm and dry.  Psychiatric: He has a normal mood and affect. His behavior is normal.  Nursing note and vitals reviewed.   Assessment & Plan:  1: Chronic heart failure with reduced ejection fraction- - NYHA class III - mildly fluid overloaded today - weighing daily; reminded to call for an overnight weight gain of >2 pounds or a weekly weight gain of >5 pounds.  - doesn't use salt shaker. Discussed the importance of closely following a 2000mg  sodium diet.  - unsure of fluid intake. Discussed keeping his fluid intake to between 40-50 ounces of fluid daily - elevate legs as much as possible - taking furosemide 20 mg daily; will increase to 40 mg daily unless he experiences diarrhea then he was instructed to take every other day - Entresto 24/26 mg twice daily started on 5/17; will continue with Entresto at this time and informed patient to elevate  right arm and leg to decrease swelling - cardiologist Nehemiah Massed) appointment later today  2: Atrial fibrillation- - slightly tachycardic today - on metoprolol and warfarin - slight bruises noted on arms  3. Suicidal Ideation-  - thoughts of suicide noted daily to nurse - denies any thoughts or plans when further questioned - on escitalopram   Encouraged patient to bring medication list.   Return in 1 week or sooner for any questions/problems before then. Will check a BMP in about a month.

## 2016-09-02 NOTE — Patient Instructions (Addendum)
Elevate arms and legs increase furosemide to 2 tablets daily.

## 2016-09-08 DIAGNOSIS — M25511 Pain in right shoulder: Secondary | ICD-10-CM | POA: Diagnosis not present

## 2016-09-08 DIAGNOSIS — G8929 Other chronic pain: Secondary | ICD-10-CM | POA: Diagnosis not present

## 2016-09-08 DIAGNOSIS — M25512 Pain in left shoulder: Secondary | ICD-10-CM | POA: Diagnosis not present

## 2016-09-09 ENCOUNTER — Ambulatory Visit: Payer: PPO | Admitting: Family

## 2016-09-10 DIAGNOSIS — M25511 Pain in right shoulder: Secondary | ICD-10-CM | POA: Diagnosis not present

## 2016-09-10 DIAGNOSIS — G8929 Other chronic pain: Secondary | ICD-10-CM | POA: Diagnosis not present

## 2016-09-10 DIAGNOSIS — M25512 Pain in left shoulder: Secondary | ICD-10-CM | POA: Diagnosis not present

## 2016-09-13 ENCOUNTER — Ambulatory Visit: Payer: PPO | Attending: Family | Admitting: Family

## 2016-09-13 ENCOUNTER — Encounter: Payer: Self-pay | Admitting: Family

## 2016-09-13 VITALS — BP 140/60 | HR 133 | Resp 20 | Ht 66.0 in | Wt 168.4 lb

## 2016-09-13 DIAGNOSIS — K219 Gastro-esophageal reflux disease without esophagitis: Secondary | ICD-10-CM | POA: Insufficient documentation

## 2016-09-13 DIAGNOSIS — I4891 Unspecified atrial fibrillation: Secondary | ICD-10-CM | POA: Insufficient documentation

## 2016-09-13 DIAGNOSIS — R1114 Bilious vomiting: Secondary | ICD-10-CM

## 2016-09-13 DIAGNOSIS — M25512 Pain in left shoulder: Secondary | ICD-10-CM | POA: Diagnosis not present

## 2016-09-13 DIAGNOSIS — G8929 Other chronic pain: Secondary | ICD-10-CM | POA: Diagnosis not present

## 2016-09-13 DIAGNOSIS — M25511 Pain in right shoulder: Secondary | ICD-10-CM | POA: Diagnosis not present

## 2016-09-13 DIAGNOSIS — I5022 Chronic systolic (congestive) heart failure: Secondary | ICD-10-CM | POA: Diagnosis not present

## 2016-09-13 DIAGNOSIS — Z7901 Long term (current) use of anticoagulants: Secondary | ICD-10-CM | POA: Insufficient documentation

## 2016-09-13 DIAGNOSIS — F319 Bipolar disorder, unspecified: Secondary | ICD-10-CM | POA: Diagnosis not present

## 2016-09-13 DIAGNOSIS — R112 Nausea with vomiting, unspecified: Secondary | ICD-10-CM | POA: Diagnosis not present

## 2016-09-13 DIAGNOSIS — H3530 Unspecified macular degeneration: Secondary | ICD-10-CM | POA: Insufficient documentation

## 2016-09-13 DIAGNOSIS — R45851 Suicidal ideations: Secondary | ICD-10-CM | POA: Insufficient documentation

## 2016-09-13 DIAGNOSIS — I482 Chronic atrial fibrillation, unspecified: Secondary | ICD-10-CM

## 2016-09-13 DIAGNOSIS — Z79899 Other long term (current) drug therapy: Secondary | ICD-10-CM | POA: Diagnosis not present

## 2016-09-13 NOTE — Progress Notes (Signed)
Patient ID: David Duncan, male    DOB: [birth year], 83 y.o.   MRN: [id number]  HPI  Mr Longest is a 83 y/o male with a history of atrial fibrillation, basal cell cancer, bipolar, CVA, anxiety, GERD, macular degeneration, melanoma, osteoarthritis and chronic heart failure.   Last echo was done 07/15/16 and showed an EF of 20% along with mild AR and severe MR/TR.  Patient has not been admitted in the last year.  He presents today for his follow-up visit with a chief complaint of of vomiting. He has been experiencing some nausea and vomiting on Friday and Saturday. He relates this to his medications as he was not nauseous on Sunday when he didn't take any of his medications. He continues to have some right sided swelling of his arm and leg.   Past Medical History:  Diagnosis Date  . Anxiety   . Atrial fibrillation ([place])   . Basal cell cancer 2008   on shoulder  . Bipolar disorder ([place])    hospitalized in past  . CVA (cerebral vascular accident) ([place]) 8/11  . Generalized anxiety disorder   . GERD (gastroesophageal reflux disease)   . History of kidney stones    30 years ago  . Macular degeneration   . Melanoma ([place])    resected from scalp approximately 1 year ago.   . Osteoarthritis    in neck and left knee  . Tremor    noted when writing   Past Surgical History:  Procedure Laterality Date  . BACK SURGERY  1991  . CATARACT EXTRACTION W/ INTRAOCULAR LENS IMPLANT Bilateral   . KNEE ARTHROPLASTY Left 12/03/2015   Procedure: COMPUTER ASSISTED TOTAL KNEE ARTHROPLASTY;  Surgeon: Dereck Leep, MD;  Location: [facility] ORS;  Service: Orthopedics;  Laterality: Left;  . SKIN CANCER EXCISION     Family History  Problem Relation Age of Onset  . Stroke Father   . Cancer Brother        prostate  . Coronary artery disease Brother   . Hypertension Brother    Social History  Substance Use Topics  . Smoking status: Never Smoker  . Smokeless tobacco: Never Used  . Alcohol use No   No  Known Allergies  Prior to Admission medications   Medication Sig Start Date End Date Taking? Authorizing Provider  Acetaminophen 500 MG coapsule Take 1 capsule by mouth every 4 (four) hours as needed for fever.   Yes [provider]  Amino Acids (FREE FORM AMINO ACID COMPLEX PO) Take 1 capsule by mouth daily.   Yes [provider]  Ascorbic Acid (VITAMIN C) 500 MG tablet Take 500 mg by mouth 2 (two) times daily.    Yes [provider]  B Complex Vitamins (B COMPLEX PO) Take by mouth 2 (two) times daily.    Yes [provider]  Calcium-Magnesium-Vitamin D (CALCIUM 500 PO) Take 2 tablets by mouth daily.   Yes [provider]  Cholecalciferol (VITAMIN D-3) 5000 UNITS TABS Take 1 tablet by mouth daily.    Yes [provider]  Cyanocobalamin (VITAMIN B-12) 2500 MCG SUBL Take 2 tabs by mouth daily    Yes [provider]  diclofenac sodium (VOLTAREN) 1 % GEL Apply 2 g topically as needed.   Yes [provider]  DIPHENHYDRAMINE HCL, SLEEP, PO Take 30 mLs by mouth as needed (Sleep).   Yes [provider]  escitalopram (LEXAPRO) 20 MG tablet Take 1 tablet (20 mg total) by mouth  every morning. 08/02/16  Yes Rainey Pines, MD  furosemide (LASIX) 20 MG tablet Take 20 mg by mouth daily.    Yes [provider]  L-Methylfolate-Algae (DEPLIN 15) 15-90.314 MG CAPS Take 15 mg by mouth every morning. 08/02/16  Yes Rainey Pines, MD  levalbuterol [facility] HFA) 45 MCG/ACT inhaler Inhale 2 puffs into the lungs every 4 (four) hours as needed for wheezing.   Yes [provider]  loperamide (IMODIUM) 2 MG capsule Take 2 mg by mouth as needed for diarrhea or loose stools.   Yes [provider]  Lutein 6 MG CAPS Take 6 mg by mouth daily.    Yes [provider]  metoprolol tartrate (LOPRESSOR) 25 MG tablet Take 50 mg by mouth 2 (two) times daily.  08/05/15  Yes [provider]  Multiple Vitamin  (MULTIVITAMIN) capsule Take 1 capsule by mouth daily.   Yes [provider]  NON FORMULARY Take 2 capsules by mouth daily.   Yes [provider]  pyridOXINE (VITAMIN B-6) 25 MG tablet Take 25 mg by mouth daily.    Yes [provider]  sacubitril-valsartan (ENTRESTO) 24-26 MG Take 1 tablet by mouth 2 (two) times daily. 08/26/16  Yes Alisa Graff, FNP  Specialty Vitamins Products (ICAPS LUTEIN-ZEAXANTHIN PO) Take 2 capsules by mouth daily.   Yes [provider]  Specialty Vitamins Products (PROSTATE PO) Take 1 tablet by mouth 2 (two) times daily.   Yes [provider]  warfarin (COUMADIN) 2.5 MG tablet Take 2.5 mg by mouth. Take 1 tablet daily 4 days a week and the other 3 days, he takes 5 mg 09/10/14  Yes [provider]    Review of Systems  Constitutional: Positive for fatigue. Negative for appetite change.  HENT: Negative for congestion, postnasal drip and sore throat.   Eyes: Negative.   Respiratory: Positive for shortness of breath. Negative for cough and chest tightness.   Cardiovascular: Positive for leg swelling (1+ pitting edema in the left leg and 2+ pitting edema in the right leg). Negative for chest pain and palpitations.  Gastrointestinal: Positive for nausea and vomiting. Negative for abdominal distention and abdominal pain.  Endocrine: Negative.   Genitourinary: Negative.   Musculoskeletal: Negative for back pain (at times).  Skin: Negative.   Allergic/Immunologic: Negative.   Neurological: Positive for light-headedness (with sudden position changes). Negative for dizziness and numbness.  Hematological: Negative for adenopathy. Does not bruise/bleed easily.  Psychiatric/Behavioral: Positive for sleep disturbance (not sleeping well (unknown why); sleeping in recliner with 1 pillow). Negative for dysphoric mood and suicidal ideas. The patient is not nervous/anxious.    Vitals:   09/13/16 1152  BP: (!) 148/111  Pulse: (!)  133  Resp: 20  SpO2: 95%  Weight: 168 lb 6 oz (76.4 kg)  Height: 5\' 6"  (1.676 m)   Wt Readings from Last 3 Encounters:  09/13/16 168 lb 6 oz (76.4 kg)  09/02/16 168 lb (76.2 kg)  08/26/16 169 lb 4 oz (76.8 kg)    Lab Results  Component Value Date   CREATININE 0.99 02/06/2016   CREATININE 1.07 12/31/2015   CREATININE 0.95 12/05/2015    Physical Exam  Constitutional: He is oriented to person, place, and time. He appears well-developed and well-nourished.  HENT:  Head: Normocephalic and atraumatic.  Neck: Normal range of motion. Neck supple. No JVD present.  Cardiovascular: An irregular rhythm present. Tachycardia present.   Pulmonary/Chest: Effort normal. He has no wheezes. He has no rales.  Abdominal:  Soft. He exhibits no distension. There is no tenderness.  Musculoskeletal: He exhibits edema (1+ pitting edema in left lower leg and 2+ pitting edema in right lower leg). He exhibits no tenderness.  Edema in right lower arm  Neurological: He is alert and oriented to person, place, and time.  + radial pulses bilaterally  Skin: Skin is warm and dry.  Psychiatric: He has a normal mood and affect. His behavior is normal.  Nursing note and vitals reviewed.   Assessment & Plan:  1: Chronic heart failure with reduced ejection fraction- - NYHA class III - euvolemic  - weighing daily; reminded to call for an overnight weight gain of >2 pounds or a weekly weight gain of >5 pounds.  - doesn't use salt shaker. Discussed the importance of closely following a 2000mg  sodium diet.  - unsure of fluid intake. Discussed keeping his fluid intake to between 40-50 ounces of fluid daily - elevate legs as much as possible - seeing physical therapy 2 times a week  - taking furosemide 20 mg daily - Entresto 24/26 mg twice daily started on 08/26/16; will continue with Entresto at this time - BMP today  - cardiologist Nehemiah Massed) appointment 09/02/16  2: Atrial fibrillation- - slightly tachycardic  today - on metoprolol and warfarin - slight bruises noted on arms  3. Suicidal Ideation-  - no thoughts of suicide noted today  - denies any thoughts or plans when further questioned - on escitalopram  - patient in better spirits this visit  4: Vomiting/nausea-  - eating a very light breakfast - encouraged patient to eat more for breakfast and take medication about 30 minutes after eating - will continue all medications at this time  Encouraged patient to bring medication list. Explained the importance of bringing medications to every appointment.   Return in 3 weeks or sooner for any questions/problems before then.

## 2016-09-13 NOTE — Patient Instructions (Signed)
Continue weighing daily and call for an overnight weight gain of > 2 pounds or a weekly weight gain of >5 pounds.   Eat breakfast and wait about 1/2 hour prior to taking morning medications.

## 2016-09-16 DIAGNOSIS — Z5181 Encounter for therapeutic drug level monitoring: Secondary | ICD-10-CM | POA: Diagnosis not present

## 2016-09-16 DIAGNOSIS — M25512 Pain in left shoulder: Secondary | ICD-10-CM | POA: Diagnosis not present

## 2016-09-16 DIAGNOSIS — M25511 Pain in right shoulder: Secondary | ICD-10-CM | POA: Diagnosis not present

## 2016-09-16 DIAGNOSIS — Z7901 Long term (current) use of anticoagulants: Secondary | ICD-10-CM | POA: Diagnosis not present

## 2016-09-16 DIAGNOSIS — G8929 Other chronic pain: Secondary | ICD-10-CM | POA: Diagnosis not present

## 2016-09-16 DIAGNOSIS — Z79899 Other long term (current) drug therapy: Secondary | ICD-10-CM | POA: Diagnosis not present

## 2016-09-20 DIAGNOSIS — G8929 Other chronic pain: Secondary | ICD-10-CM | POA: Diagnosis not present

## 2016-09-20 DIAGNOSIS — M25511 Pain in right shoulder: Secondary | ICD-10-CM | POA: Diagnosis not present

## 2016-09-20 DIAGNOSIS — M25512 Pain in left shoulder: Secondary | ICD-10-CM | POA: Diagnosis not present

## 2016-09-23 DIAGNOSIS — G8929 Other chronic pain: Secondary | ICD-10-CM | POA: Diagnosis not present

## 2016-09-23 DIAGNOSIS — Z7901 Long term (current) use of anticoagulants: Secondary | ICD-10-CM | POA: Diagnosis not present

## 2016-09-23 DIAGNOSIS — M25511 Pain in right shoulder: Secondary | ICD-10-CM | POA: Diagnosis not present

## 2016-09-23 DIAGNOSIS — Z5181 Encounter for therapeutic drug level monitoring: Secondary | ICD-10-CM | POA: Diagnosis not present

## 2016-09-23 DIAGNOSIS — M25512 Pain in left shoulder: Secondary | ICD-10-CM | POA: Diagnosis not present

## 2016-09-27 ENCOUNTER — Ambulatory Visit: Payer: PPO | Admitting: Psychiatry

## 2016-09-27 ENCOUNTER — Ambulatory Visit (INDEPENDENT_AMBULATORY_CARE_PROVIDER_SITE_OTHER): Payer: PPO | Admitting: Psychiatry

## 2016-09-27 ENCOUNTER — Encounter: Payer: Self-pay | Admitting: Psychiatry

## 2016-09-27 VITALS — BP 93/63 | HR 82 | Temp 98.7°F | Wt 157.8 lb

## 2016-09-27 DIAGNOSIS — M25511 Pain in right shoulder: Secondary | ICD-10-CM | POA: Diagnosis not present

## 2016-09-27 DIAGNOSIS — F331 Major depressive disorder, recurrent, moderate: Secondary | ICD-10-CM

## 2016-09-27 DIAGNOSIS — G8929 Other chronic pain: Secondary | ICD-10-CM | POA: Diagnosis not present

## 2016-09-27 DIAGNOSIS — R4189 Other symptoms and signs involving cognitive functions and awareness: Secondary | ICD-10-CM

## 2016-09-27 DIAGNOSIS — M25512 Pain in left shoulder: Secondary | ICD-10-CM | POA: Diagnosis not present

## 2016-09-27 MED ORDER — ESCITALOPRAM OXALATE 20 MG PO TABS: 20.0000 mg | ORAL_TABLET | ORAL | 1 refills | Status: DC

## 2016-09-27 NOTE — Progress Notes (Signed)
BH MD/PA/NP OP Progress Note  09/27/2016 1:16 PM David Duncan  MRN:  [id number]  Subjective:  Patient is a 83 year old male who presented for the follow-up. He was recently diagnosed with acute on chronic congestive heart failure. Patient was walking by himself. He reported that he has noticed some improvement in his condition and he is able to walk by himself. He reported that he continues to feel depressed at times and occasionally he has suicidal ideations but he is not going to do anything. He currently denied having any suicidal thoughts. He reported that he has been compliant with his medications. His wife has been helping him with his medications. He currently sleeps in the recliner. He has noticed improvement in his shortness of breath as well. His blood pressure was noted to be low and he reported that it was higher last week and his medications were adjusted. He has been feeling tired at times. He reported that he is well. He denied having any thoughts to hurt himself. We discussed about his medications at length.   He appears pleasant and cooperative during the interview.  He is also taking Deplin and reported that it has been helpful.   Chief Complaint:  Chief Complaint    Follow-up; Medication Refill     Visit Diagnosis:     ICD-10-CM   1. Depression, major, recurrent, moderate ([place]) F33.1   2. Cognitive decline R41.89     Past Medical History:  Past Medical History:  Diagnosis Date  . Anxiety   . Atrial fibrillation ([place])   . Basal cell cancer 2008   on shoulder  . Bipolar disorder ([place])    hospitalized in past  . CVA (cerebral vascular accident) ([place]) 8/11  . Generalized anxiety disorder   . GERD (gastroesophageal reflux disease)   . History of kidney stones    30 years ago  . Macular degeneration   . Melanoma ([place])    resected from scalp approximately 1 year ago.   . Osteoarthritis    in neck and left knee  . Tremor    noted when writing    Past Surgical  History:  Procedure Laterality Date  . BACK SURGERY  1991  . CATARACT EXTRACTION W/ INTRAOCULAR LENS IMPLANT Bilateral   . KNEE ARTHROPLASTY Left 12/03/2015   Procedure: COMPUTER ASSISTED TOTAL KNEE ARTHROPLASTY;  Surgeon: Dereck Leep, MD;  Location: [facility] ORS;  Service: Orthopedics;  Laterality: Left;  . SKIN CANCER EXCISION     Family History:  Family History  Problem Relation Age of Onset  . Stroke Father   . Cancer Brother        prostate  . Coronary artery disease Brother   . Hypertension Brother    Social History:  Social History   Social History  . Marital status: Married    Spouse name: N/A  . Number of children: N/A  . Years of education: N/A   Occupational History  . retired     used to work on farm, Press photographer, Cabin crew   Social History Main Topics  . Smoking status: Never Smoker  . Smokeless tobacco: Never Used  . Alcohol use No  . Drug use: No  . Sexual activity: No   Other Topics Concern  . None   Social History Narrative   Moved from [place] 3/10   Walks 30-40 minutes daily   Additional History:   Atrial fibrillation (CMS-HCC)  . Bipolar affective disorder (CMS-HCC)  . CVA (cerebral vascular accident) (CMS-HCC)  11/2009  . Depression  . Dilated cardiomyopathy (CMS-HCC)  . Dyslipidemia  . GAD (generalized anxiety disorder)  . GERD (gastroesophageal reflux disease)  . History of basal cell cancer 2008  shoulder  . History of kidney stones  . Macular degeneration  . Melanoma (CMS-HCC)  on Scalp  . Osteoarthritis  . Stroke (CMS-HCC)  11/2009  . VHD (valvular heart disease)       Assessment:    Musculoskeletal: Strength & Muscle Tone: within normal limits Gait & Station: normal Patient leans: N/A  Psychiatric Specialty Exam: Medication Refill   Anxiety  Patient reports no insomnia, nervous/anxious behavior or suicidal ideas.    Depression         Associated symptoms include does not have insomnia and no suicidal ideas.  Past  medical history includes anxiety.     Review of Systems  Psychiatric/Behavioral: Negative for depression, hallucinations, memory loss, substance abuse and suicidal ideas. The patient is not nervous/anxious and does not have insomnia.   All other systems reviewed and are negative.   Blood pressure 93/63, pulse 82, temperature 98.7 F (37.1 C), temperature source Oral, weight 157 lb 12.8 oz (71.6 kg).Body mass index is 25.47 kg/m.  General Appearance: Well Groomed and slow gait  Eye Contact:  Good  Speech:  Normal Rate and Slow  Volume:  Normal  Mood:  Anxious  Affect:  Appropriate  Thought Process:  Linear and Logical  Orientation:  Full (Time, Place, and Person)  Thought Content:  Negative  Suicidal Thoughts:  No  Homicidal Thoughts:  No  Memory:  Immediate;   Good Recent;   Good Remote;   Good  Judgement:  Good  Insight:  Good  Psychomotor Activity:  Normal  Concentration:  Good  Recall:  Good  Fund of Knowledge: Good  Language: Good  Akathisia:  Negative  Handed:  Right unknown  AIMS (if indicated):  No done  Assets:  Communication Skills Desire for Improvement  ADL's:  Intact  Cognition: WNL  Sleep:  good   Is the patient at risk to self?  No. Has the patient been a risk to self in the past 6 months?  No. Has the patient been a risk to self within the distant past?  No. Is the patient a risk to others?  No. Has the patient been a risk to others in the past 6 months?  No. Has the patient been a risk to others within the distant past?  No.  Current Medications: Current Outpatient Prescriptions  Medication Sig Dispense Refill  . Acetaminophen 500 MG coapsule Take 1 capsule by mouth every 4 (four) hours as needed for fever.    . Amino Acids (FREE FORM AMINO ACID COMPLEX PO) Take 1 capsule by mouth daily.    . Ascorbic Acid (VITAMIN C) 500 MG tablet Take 500 mg by mouth 2 (two) times daily.     . B Complex Vitamins (B COMPLEX PO) Take by mouth 2 (two) times daily.      . Calcium-Magnesium-Vitamin D (CALCIUM 500 PO) Take 2 tablets by mouth daily.    . Cholecalciferol (VITAMIN D-3) 5000 UNITS TABS Take 1 tablet by mouth daily.     . Cyanocobalamin (VITAMIN B-12) 2500 MCG SUBL Take 2 tabs by mouth daily     . diclofenac sodium (VOLTAREN) 1 % GEL Apply 2 g topically as needed.    [name] Kitchen DIPHENHYDRAMINE HCL, SLEEP, PO Take 30 mLs by mouth as needed (Sleep).    [name] Kitchen escitalopram (LEXAPRO)  20 MG tablet Take 1 tablet (20 mg total) by mouth every morning. 90 tablet 1  . furosemide (LASIX) 20 MG tablet Take 20 mg by mouth daily.     [name] Kitchen L-Methylfolate-Algae (DEPLIN 15) 15-90.314 MG CAPS Take 15 mg by mouth every morning. 28 capsule 0  . levalbuterol (XOPENEX HFA) 45 MCG/ACT inhaler Inhale 2 puffs into the lungs every 4 (four) hours as needed for wheezing.    [name] Kitchen loperamide (IMODIUM) 2 MG capsule Take 2 mg by mouth as needed for diarrhea or loose stools.    . Lutein 6 MG CAPS Take 6 mg by mouth daily.     . metoprolol tartrate (LOPRESSOR) 25 MG tablet Take 50 mg by mouth 2 (two) times daily.     . Multiple Vitamin (MULTIVITAMIN) capsule Take 1 capsule by mouth daily.    . NON FORMULARY Take 2 capsules by mouth daily.    [name] Kitchen pyridOXINE (VITAMIN B-6) 25 MG tablet Take 25 mg by mouth daily.     . sacubitril-valsartan (ENTRESTO) 24-26 MG Take 1 tablet by mouth 2 (two) times daily. 60 tablet 3  . Specialty Vitamins Products (ICAPS LUTEIN-ZEAXANTHIN PO) Take 2 capsules by mouth daily.    [name] Kitchen Specialty Vitamins Products (PROSTATE PO) Take 1 tablet by mouth 2 (two) times daily.    [name] Kitchen warfarin (COUMADIN) 2.5 MG tablet Take 2.5 mg by mouth. Take 1 tablet daily 4 days a week and the other 3 days, he takes 5 mg     No current facility-administered medications for this visit.     Medical Decision Making:  Established Problem, Stable/Improving (1)  Treatment Plan Summary:Medication management We discussed at length about the medications treatment risks benefits and alternatives  Continue  Lexapro 20 mg daily. Continue Deplin 15 mg one pill daily and he was given samples for the next 5  weeks.  Advised him to call if he notices worsening of his symptoms and he demonstrated understanding.  Follow-up in 1 months or earlier depending on his symptoms   More than 50% of the time spent in psychoeducation, counseling and coordination of care.    This note was generated in part or whole with voice recognition software. Voice regonition is usually quite accurate but there are transcription errors that can and very often do occur. I apologize for any typographical errors that were not detected and corrected.     Rainey Pines, MD  09/27/2016, 1:16 PM

## 2016-09-30 DIAGNOSIS — M25512 Pain in left shoulder: Secondary | ICD-10-CM | POA: Diagnosis not present

## 2016-09-30 DIAGNOSIS — M25511 Pain in right shoulder: Secondary | ICD-10-CM | POA: Diagnosis not present

## 2016-09-30 DIAGNOSIS — G8929 Other chronic pain: Secondary | ICD-10-CM | POA: Diagnosis not present

## 2016-10-04 ENCOUNTER — Ambulatory Visit: Payer: PPO | Admitting: Family

## 2016-10-04 DIAGNOSIS — M25511 Pain in right shoulder: Secondary | ICD-10-CM | POA: Diagnosis not present

## 2016-10-04 DIAGNOSIS — G8929 Other chronic pain: Secondary | ICD-10-CM | POA: Diagnosis not present

## 2016-10-04 DIAGNOSIS — M25512 Pain in left shoulder: Secondary | ICD-10-CM | POA: Diagnosis not present

## 2016-10-07 DIAGNOSIS — M25512 Pain in left shoulder: Secondary | ICD-10-CM | POA: Diagnosis not present

## 2016-10-07 DIAGNOSIS — I340 Nonrheumatic mitral (valve) insufficiency: Secondary | ICD-10-CM | POA: Diagnosis not present

## 2016-10-07 DIAGNOSIS — M25511 Pain in right shoulder: Secondary | ICD-10-CM | POA: Diagnosis not present

## 2016-10-07 DIAGNOSIS — G8929 Other chronic pain: Secondary | ICD-10-CM | POA: Diagnosis not present

## 2016-10-07 DIAGNOSIS — I10 Essential (primary) hypertension: Secondary | ICD-10-CM | POA: Diagnosis not present

## 2016-10-07 DIAGNOSIS — I5022 Chronic systolic (congestive) heart failure: Secondary | ICD-10-CM | POA: Diagnosis not present

## 2016-10-07 DIAGNOSIS — I482 Chronic atrial fibrillation: Secondary | ICD-10-CM | POA: Diagnosis not present

## 2016-10-11 DIAGNOSIS — M25511 Pain in right shoulder: Secondary | ICD-10-CM | POA: Diagnosis not present

## 2016-10-11 DIAGNOSIS — M25512 Pain in left shoulder: Secondary | ICD-10-CM | POA: Diagnosis not present

## 2016-10-11 DIAGNOSIS — G8929 Other chronic pain: Secondary | ICD-10-CM | POA: Diagnosis not present

## 2016-10-12 DIAGNOSIS — H353132 Nonexudative age-related macular degeneration, bilateral, intermediate dry stage: Secondary | ICD-10-CM | POA: Diagnosis not present

## 2016-10-12 DIAGNOSIS — H35013 Changes in retinal vascular appearance, bilateral: Secondary | ICD-10-CM | POA: Diagnosis not present

## 2016-10-12 DIAGNOSIS — H5319 Other subjective visual disturbances: Secondary | ICD-10-CM | POA: Diagnosis not present

## 2016-10-14 DIAGNOSIS — M25511 Pain in right shoulder: Secondary | ICD-10-CM | POA: Diagnosis not present

## 2016-10-14 DIAGNOSIS — G8929 Other chronic pain: Secondary | ICD-10-CM | POA: Diagnosis not present

## 2016-10-14 DIAGNOSIS — M25512 Pain in left shoulder: Secondary | ICD-10-CM | POA: Diagnosis not present

## 2016-10-19 DIAGNOSIS — M25511 Pain in right shoulder: Secondary | ICD-10-CM | POA: Diagnosis not present

## 2016-10-19 DIAGNOSIS — M25512 Pain in left shoulder: Secondary | ICD-10-CM | POA: Diagnosis not present

## 2016-10-19 DIAGNOSIS — G8929 Other chronic pain: Secondary | ICD-10-CM | POA: Diagnosis not present

## 2016-10-21 DIAGNOSIS — M25512 Pain in left shoulder: Secondary | ICD-10-CM | POA: Diagnosis not present

## 2016-10-21 DIAGNOSIS — Z7901 Long term (current) use of anticoagulants: Secondary | ICD-10-CM | POA: Diagnosis not present

## 2016-10-21 DIAGNOSIS — M25511 Pain in right shoulder: Secondary | ICD-10-CM | POA: Diagnosis not present

## 2016-10-21 DIAGNOSIS — G8929 Other chronic pain: Secondary | ICD-10-CM | POA: Diagnosis not present

## 2016-10-25 ENCOUNTER — Ambulatory Visit (INDEPENDENT_AMBULATORY_CARE_PROVIDER_SITE_OTHER): Payer: PPO | Admitting: Family Medicine

## 2016-10-25 ENCOUNTER — Telehealth: Payer: Self-pay

## 2016-10-25 ENCOUNTER — Encounter: Payer: Self-pay | Admitting: Family Medicine

## 2016-10-25 VITALS — BP 112/80 | HR 107 | Ht 66.0 in | Wt 156.0 lb

## 2016-10-25 DIAGNOSIS — S4991XS Unspecified injury of right shoulder and upper arm, sequela: Secondary | ICD-10-CM

## 2016-10-25 MED ORDER — OXYCODONE-ACETAMINOPHEN 5-325 MG PO TABS: 1.0000 | ORAL_TABLET | Freq: Three times a day (TID) | ORAL | 0 refills | Status: DC | PRN

## 2016-10-25 NOTE — Progress Notes (Signed)
Subjective:   Patient ID: David Duncan, male    DOB: 1931-01-15, 83 y.o.   MRN: [id number]  David Duncan is a pleasant 83 y.o. year old male patient of Dr. Glori Bickers, new to me, who presents to clinic today with Neck Pain and Shoulder Pain  on 10/25/2016  HPI:  Has been receiving PT for a right shoulder injury.  Since PT on Thursday, has had severe left neck pain and right shoulder pain. No known injury.  Does not feel like he did anything more strenous at PT.  Has not followed up with ortho "in awhile."  Here for a "pain shot."  He is taking tramadol but this is not helping with the severe pain he has had since Thursday.   Current Outpatient Prescriptions on File Prior to Visit  Medication Sig Dispense Refill  . Acetaminophen 500 MG coapsule Take 1 capsule by mouth every 4 (four) hours as needed for fever.    . Amino Acids (FREE FORM AMINO ACID COMPLEX PO) Take 1 capsule by mouth daily.    . Ascorbic Acid (VITAMIN C) 500 MG tablet Take 500 mg by mouth 2 (two) times daily.     . B Complex Vitamins (B COMPLEX PO) Take by mouth 2 (two) times daily.     . Calcium-Magnesium-Vitamin D (CALCIUM 500 PO) Take 2 tablets by mouth daily.    . Cholecalciferol (VITAMIN D-3) 5000 UNITS TABS Take 1 tablet by mouth daily.     . Cyanocobalamin (VITAMIN B-12) 2500 MCG SUBL Take 2 tabs by mouth daily     . diclofenac sodium (VOLTAREN) 1 % GEL Apply 2 g topically as needed.    [name] Kitchen DIPHENHYDRAMINE HCL, SLEEP, PO Take 30 mLs by mouth as needed (Sleep).    [name] Kitchen escitalopram (LEXAPRO) 20 MG tablet Take 1 tablet (20 mg total) by mouth every morning. 90 tablet 1  . furosemide (LASIX) 20 MG tablet Take 20 mg by mouth daily.     [name] Kitchen L-Methylfolate-Algae (DEPLIN 15) 15-90.314 MG CAPS Take 15 mg by mouth every morning. 28 capsule 0  . levalbuterol (XOPENEX HFA) 45 MCG/ACT inhaler Inhale 2 puffs into the lungs every 4 (four) hours as needed for wheezing.    [name] Kitchen loperamide (IMODIUM) 2 MG capsule Take 2 mg by mouth as  needed for diarrhea or loose stools.    . Lutein 6 MG CAPS Take 6 mg by mouth daily.     . metoprolol tartrate (LOPRESSOR) 25 MG tablet Take 50 mg by mouth 2 (two) times daily.     . Multiple Vitamin (MULTIVITAMIN) capsule Take 1 capsule by mouth daily.    . NON FORMULARY Take 2 capsules by mouth daily.    [name] Kitchen pyridOXINE (VITAMIN B-6) 25 MG tablet Take 25 mg by mouth daily.     . sacubitril-valsartan (ENTRESTO) 24-26 MG Take 1 tablet by mouth 2 (two) times daily. 60 tablet 3  . Specialty Vitamins Products (ICAPS LUTEIN-ZEAXANTHIN PO) Take 2 capsules by mouth daily.    [name] Kitchen Specialty Vitamins Products (PROSTATE PO) Take 1 tablet by mouth 2 (two) times daily.    [name] Kitchen warfarin (COUMADIN) 2.5 MG tablet Take 2.5 mg by mouth. Take 1 tablet daily 4 days a week and the other 3 days, he takes 5 mg     No current facility-administered medications on file prior to visit.     No Known Allergies  Past Medical History:  Diagnosis Date  . Anxiety   . Atrial fibrillation ([place])   .  Basal cell cancer 2008   on shoulder  . Bipolar disorder ([place])    hospitalized in past  . CVA (cerebral vascular accident) ([place]) 8/11  . Generalized anxiety disorder   . GERD (gastroesophageal reflux disease)   . History of kidney stones    30 years ago  . Macular degeneration   . Melanoma ([place])    resected from scalp approximately 1 year ago.   . Osteoarthritis    in neck and left knee  . Tremor    noted when writing    Past Surgical History:  Procedure Laterality Date  . BACK SURGERY  1991  . CATARACT EXTRACTION W/ INTRAOCULAR LENS IMPLANT Bilateral   . KNEE ARTHROPLASTY Left 12/03/2015   Procedure: COMPUTER ASSISTED TOTAL KNEE ARTHROPLASTY;  Surgeon: Dereck Leep, MD;  Location: [facility] ORS;  Service: Orthopedics;  Laterality: Left;  . SKIN CANCER EXCISION      Family History  Problem Relation Age of Onset  . Stroke Father   . Cancer Brother        prostate  . Coronary artery disease Brother   . Hypertension  Brother     Social History   Social History  . Marital status: Married    Spouse name: N/A  . Number of children: N/A  . Years of education: N/A   Occupational History  . retired     used to work on farm, Press photographer, Cabin crew   Social History Main Topics  . Smoking status: Never Smoker  . Smokeless tobacco: Never Used  . Alcohol use No  . Drug use: No  . Sexual activity: No   Other Topics Concern  . Not on file   Social History Narrative   Moved from [place] 3/10   Walks 30-40 minutes daily   The PMH, PSH, Social History, Family History, Medications, and allergies have been reviewed in [facility], and have been updated if relevant.  Review of Systems  Musculoskeletal: Positive for arthralgias and neck pain.  All other systems reviewed and are negative.      Objective:    BP 112/80   Pulse (!) 107   Ht 5\' 6"  (1.676 m)   Wt 156 lb (70.8 kg)   SpO2 98%   BMI 25.18 kg/m    Physical Exam  Constitutional: He is oriented to person, place, and time. He appears well-developed and well-nourished. No distress.  HENT:  Head: Atraumatic.  Pulmonary/Chest: Effort normal.  Musculoskeletal:       Right shoulder: He exhibits decreased range of motion.       Left shoulder: He exhibits normal range of motion and no tenderness.       Cervical back: He exhibits decreased range of motion and pain. He exhibits no tenderness, no bony tenderness and no spasm.  Neurological: He is alert and oriented to person, place, and time. No cranial nerve deficit.  Skin: Skin is warm and dry. He is not diaphoretic.  Psychiatric: He has a normal mood and affect. His behavior is normal. Judgment and thought content normal.  Nursing note and vitals reviewed.         Assessment & Plan:   Injury of right shoulder, sequela No Follow-up on file.

## 2016-10-25 NOTE — Patient Instructions (Signed)
Great to see you. Percocet as needed for severe pain.  Please follow up with your doctor.

## 2016-10-25 NOTE — Telephone Encounter (Signed)
Pt has appt with Dr Deborra Medina today at 10:15.

## 2016-10-25 NOTE — Assessment & Plan Note (Signed)
With worsening pain since PT. Asking for a "shot" today but explained to patient that all we have here is Toradol which I would prefer not to give him since he is on coumadin. Given severity of pain- short course of percocet is appropriate. Discussed falling and sedation risks. Call or return to clinic prn if these symptoms worsen or fail to improve as anticipated. The patient indicates understanding of these issues and agrees with the plan.

## 2016-10-25 NOTE — Telephone Encounter (Signed)
PLEASE NOTE: All timestamps contained within this report are represented as [place] Standard Time. CONFIDENTIALTY NOTICE: This fax transmission is intended only for the addressee. It contains information that is legally privileged, confidential or otherwise protected from use or disclosure. If you are not the intended recipient, you are strictly prohibited from reviewing, disclosing, copying using or disseminating any of this information or taking any action in reliance on or regarding this information. If you have received this fax in error, please notify us immediately by telephone so that we can arrange for its return to [place]. Phone: [phone], Toll-Free: [phone], Fax: [phone] Page: 1 of 1 Call Id: [id number] [place] Night - Client Nonclinical Telephone Record [place] Night - Client Client Site [facility], Roque Lias - MD Contact Type Call Who Is Calling Patient / Member / Family / Caregiver Caller Name [place] Phone Number [phone] Patient Name David Duncan [birth year] Call Type Message Only Information Provided Reason for Call Request for General Office Information Initial Comment Husband Damek Ende Dec 26, 1930 needs a pain shot ASAP on Monday. Additional Comment She wanted to send this to Dr Marliss Coots nurse as a message, for Monday. Call Closed By: Eather Colas Transaction Date/Time: 10/24/2016 7:50:08 AM (ET)

## 2016-10-25 NOTE — Progress Notes (Signed)
Pre visit review using our clinic review tool, if applicable. No additional management support is needed unless otherwise documented below in the visit note. 

## 2016-10-29 ENCOUNTER — Encounter: Payer: Self-pay | Admitting: Family

## 2016-10-29 ENCOUNTER — Ambulatory Visit: Payer: PPO | Attending: Family | Admitting: Family

## 2016-10-29 VITALS — BP 95/69 | HR 93 | Resp 20 | Ht 66.0 in | Wt 156.5 lb

## 2016-10-29 DIAGNOSIS — H3530 Unspecified macular degeneration: Secondary | ICD-10-CM | POA: Diagnosis not present

## 2016-10-29 DIAGNOSIS — K219 Gastro-esophageal reflux disease without esophagitis: Secondary | ICD-10-CM | POA: Diagnosis not present

## 2016-10-29 DIAGNOSIS — F419 Anxiety disorder, unspecified: Secondary | ICD-10-CM | POA: Diagnosis not present

## 2016-10-29 DIAGNOSIS — I4891 Unspecified atrial fibrillation: Secondary | ICD-10-CM | POA: Insufficient documentation

## 2016-10-29 DIAGNOSIS — I509 Heart failure, unspecified: Secondary | ICD-10-CM | POA: Diagnosis present

## 2016-10-29 DIAGNOSIS — Z823 Family history of stroke: Secondary | ICD-10-CM | POA: Diagnosis not present

## 2016-10-29 DIAGNOSIS — Z8249 Family history of ischemic heart disease and other diseases of the circulatory system: Secondary | ICD-10-CM | POA: Insufficient documentation

## 2016-10-29 DIAGNOSIS — M1990 Unspecified osteoarthritis, unspecified site: Secondary | ICD-10-CM | POA: Diagnosis not present

## 2016-10-29 DIAGNOSIS — Z87442 Personal history of urinary calculi: Secondary | ICD-10-CM | POA: Diagnosis not present

## 2016-10-29 DIAGNOSIS — Z8042 Family history of malignant neoplasm of prostate: Secondary | ICD-10-CM | POA: Diagnosis not present

## 2016-10-29 DIAGNOSIS — Z8673 Personal history of transient ischemic attack (TIA), and cerebral infarction without residual deficits: Secondary | ICD-10-CM | POA: Diagnosis not present

## 2016-10-29 DIAGNOSIS — I959 Hypotension, unspecified: Secondary | ICD-10-CM | POA: Diagnosis not present

## 2016-10-29 DIAGNOSIS — F319 Bipolar disorder, unspecified: Secondary | ICD-10-CM | POA: Diagnosis not present

## 2016-10-29 DIAGNOSIS — R000 Tachycardia, unspecified: Secondary | ICD-10-CM | POA: Insufficient documentation

## 2016-10-29 DIAGNOSIS — I5022 Chronic systolic (congestive) heart failure: Secondary | ICD-10-CM | POA: Insufficient documentation

## 2016-10-29 DIAGNOSIS — Z7901 Long term (current) use of anticoagulants: Secondary | ICD-10-CM | POA: Insufficient documentation

## 2016-10-29 DIAGNOSIS — Z79899 Other long term (current) drug therapy: Secondary | ICD-10-CM | POA: Diagnosis not present

## 2016-10-29 DIAGNOSIS — I482 Chronic atrial fibrillation, unspecified: Secondary | ICD-10-CM

## 2016-10-29 DIAGNOSIS — Z85820 Personal history of malignant melanoma of skin: Secondary | ICD-10-CM | POA: Diagnosis not present

## 2016-10-29 DIAGNOSIS — I950 Idiopathic hypotension: Secondary | ICD-10-CM

## 2016-10-29 NOTE — Patient Instructions (Addendum)
Continue weighing daily and call for an overnight weight gain of > 2 pounds or a weekly weight gain of >5 pounds.  Stop furosemide (lasix) and take it as needed based on weight, swelling or shortness of breath

## 2016-10-29 NOTE — Progress Notes (Signed)
Patient ID: David Duncan, male    DOB: 1930/05/27, 83 y.o.   MRN: [id number]  HPI  David Duncan is a 83 y/o male with a history of atrial fibrillation, basal cell cancer, bipolar, CVA, anxiety, GERD, macular degeneration, melanoma, osteoarthritis and chronic heart failure.   Last echo was done 07/15/16 and showed an EF of 20% along with mild AR and severe David/TR.  Patient has not been admitted in the last year.  He presents today for his follow-up visit with a chief complaint of mild lower extremity edema. He describes this as chronic in nature and has been present for several years with improvement over the last few weeks. He denies any fatigue, shortness of breath, dizziness or weight gain.   Past Medical History:  Diagnosis Date  . Anxiety   . Atrial fibrillation ([place])   . Basal cell cancer 2008   on shoulder  . Bipolar disorder ([place])    hospitalized in past  . CVA (cerebral vascular accident) ([place]) 8/11  . Generalized anxiety disorder   . GERD (gastroesophageal reflux disease)   . History of kidney stones    30 years ago  . Macular degeneration   . Melanoma ([place])    resected from scalp approximately 1 year ago.   . Osteoarthritis    in neck and left knee  . Tremor    noted when writing   Past Surgical History:  Procedure Laterality Date  . BACK SURGERY  1991  . CATARACT EXTRACTION W/ INTRAOCULAR LENS IMPLANT Bilateral   . KNEE ARTHROPLASTY Left 12/03/2015   Procedure: COMPUTER ASSISTED TOTAL KNEE ARTHROPLASTY;  Surgeon: Dereck Leep, MD;  Location: [facility] ORS;  Service: Orthopedics;  Laterality: Left;  . SKIN CANCER EXCISION     Family History  Problem Relation Age of Onset  . Stroke Father   . Cancer Brother        prostate  . Coronary artery disease Brother   . Hypertension Brother    Social History  Substance Use Topics  . Smoking status: Never Smoker  . Smokeless tobacco: Never Used  . Alcohol use No   No Known Allergies Prior to Admission medications    Medication Sig Start Date End Date Taking? Authorizing Provider  Acetaminophen 500 MG coapsule Take 1 capsule by mouth every 4 (four) hours as needed for fever.   Yes [provider]  Amino Acids (FREE FORM AMINO ACID COMPLEX PO) Take 1 capsule by mouth daily.   Yes [provider]  Ascorbic Acid (VITAMIN C) 500 MG tablet Take 500 mg by mouth 2 (two) times daily.    Yes [provider]  B Complex Vitamins (B COMPLEX PO) Take by mouth 2 (two) times daily.    Yes [provider]  Calcium-Magnesium-Vitamin D (CALCIUM 500 PO) Take 2 tablets by mouth daily.   Yes [provider]  Cyanocobalamin (VITAMIN B-12) 2500 MCG SUBL Place 1 tablet under the tongue daily. Take 2 tabs by mouth daily    Yes [provider]  diclofenac sodium (VOLTAREN) 1 % GEL Apply 2 g topically as needed.   Yes [provider]  DIPHENHYDRAMINE HCL, SLEEP, PO Take 30 mLs by mouth as needed (Sleep).   Yes [provider]  escitalopram (LEXAPRO) 20 MG tablet Take 1 tablet (20 mg total) by mouth every morning. 09/27/16  Yes Rainey Pines, MD  furosemide (LASIX) 20 MG tablet Take 20 mg by mouth daily.    Yes [provider]  L-Methylfolate-Algae (DEPLIN 15) 15-90.314 MG CAPS Take 15 mg by mouth every morning. 08/02/16  Yes Rainey Pines, MD  loperamide (IMODIUM) 2 MG capsule Take 2 mg by mouth as needed for diarrhea or loose stools.   Yes [provider]  Lutein 6 MG CAPS Take 6 mg by mouth daily.    Yes [provider]  metoprolol tartrate (LOPRESSOR) 25 MG tablet Take 50 mg by mouth 2 (two) times daily.  08/05/15  Yes [provider]  Multiple Vitamin (MULTIVITAMIN) capsule Take 1 capsule by mouth daily.   Yes [provider]  sacubitril-valsartan (ENTRESTO) 24-26 MG Take 1 tablet by mouth 2 (two) times daily. 08/26/16  Yes Alisa Graff, FNP  Specialty Vitamins Products (ICAPS LUTEIN-ZEAXANTHIN PO) Take 2 capsules by  mouth daily.   Yes [provider]  Specialty Vitamins Products (PROSTATE PO) Take 1 tablet by mouth 2 (two) times daily.   Yes [provider]  warfarin (COUMADIN) 2.5 MG tablet Take 2.5 mg by mouth. Take 1 tablet daily 4 days a week and the other 3 days, he takes 5 mg 09/10/14  Yes [provider]  NON FORMULARY Take 2 capsules by mouth daily.    [provider]   Review of Systems  Constitutional: Negative for appetite change and fatigue.  HENT: Negative for congestion, rhinorrhea and sore throat.   Eyes: Negative.   Respiratory: Negative for cough, chest tightness and shortness of breath.   Cardiovascular: Positive for leg swelling (minimal). Negative for chest pain and palpitations.  Gastrointestinal: Negative for abdominal distention and abdominal pain.  Endocrine: Negative.   Genitourinary: Negative.   Musculoskeletal: Positive for arthralgias (right shoulder). Negative for back pain.  Skin: Negative.   Allergic/Immunologic: Negative.   Neurological: Negative for dizziness and light-headedness.  Hematological: Negative for adenopathy. Does not bruise/bleed easily.  Psychiatric/Behavioral: Negative for dysphoric mood and sleep disturbance (sleeping in recliner due to comfort). The patient is not nervous/anxious.    Vitals:   10/29/16 1249  BP: 95/69  Pulse: 93  Resp: 20  SpO2: 99%  Weight: 156 lb 8 oz (71 kg)  Height: 5\' 6"  (1.676 m)   Wt Readings from Last 3 Encounters:  10/29/16 156 lb 8 oz (71 kg)  10/25/16 156 lb (70.8 kg)  09/13/16 168 lb 6 oz (76.4 kg)   Lab Results  Component Value Date   CREATININE 0.99 02/06/2016   CREATININE 1.07 12/31/2015   CREATININE 0.95 12/05/2015   Physical Exam  Constitutional: He is oriented to person, place, and time. He appears well-developed and well-nourished.  HENT:  Head: Normocephalic and atraumatic.  Neck: Normal range of motion. Neck supple. No JVD present.  Cardiovascular: An irregular  rhythm present. Tachycardia present.   Pulmonary/Chest: Effort normal. He has no wheezes. He has no rales.  Abdominal: Soft. He exhibits no distension. There is no tenderness.  Musculoskeletal: He exhibits edema (trace pedal edema around bilateral ankles). He exhibits no tenderness.  Neurological: He is alert and oriented to person, place, and time.  Skin: Skin is warm and dry.  Psychiatric: He has a normal mood and affect. His behavior is normal. Thought content normal.  Nursing note and vitals reviewed.  Assessment & Plan:  1: Chronic heart failure with reduced ejection fraction- - NYHA class I - euvolemic  - weighing daily; reminded to call for an overnight weight gain of >2 pounds or a weekly weight gain of >5 pounds.  - weight down 11.8 pounds since  he was last here - doesn't use salt shaker. Discussed the importance of closely following a 2000mg  sodium diet.  - unsure of fluid intake. Discussed keeping his fluid intake to between 40-50 ounces of fluid daily - saw cardiologist Nehemiah Massed) 10/07/16 and returns to him October 2018 - Pharm D went in and reviewed medications with the patient and his wife  2: Atrial fibrillation- - slightly tachycardic today - on metoprolol and warfarin - slight bruises noted on arms  3: Hypotension- - will stop furosemide and use PRN - advised patient and wife to take the furosemide for above weight gain parameters, increased edema or shortness of breath  Patient did not bring his medications nor a list. Each medication was verbally reviewed with the patient and he was encouraged to bring the bottles to every visit to confirm accuracy of list.  Return in 2 weeks or sooner for any questions/problems before then.

## 2016-11-01 ENCOUNTER — Encounter: Payer: Self-pay | Admitting: Psychiatry

## 2016-11-01 ENCOUNTER — Ambulatory Visit (INDEPENDENT_AMBULATORY_CARE_PROVIDER_SITE_OTHER): Payer: PPO | Admitting: Psychiatry

## 2016-11-01 VITALS — BP 108/62 | HR 79 | Ht 66.0 in | Wt 157.0 lb

## 2016-11-01 DIAGNOSIS — R4189 Other symptoms and signs involving cognitive functions and awareness: Secondary | ICD-10-CM

## 2016-11-01 DIAGNOSIS — F331 Major depressive disorder, recurrent, moderate: Secondary | ICD-10-CM | POA: Diagnosis not present

## 2016-11-01 MED ORDER — ESCITALOPRAM OXALATE 20 MG PO TABS: 20.0000 mg | ORAL_TABLET | ORAL | 1 refills | Status: DC

## 2016-11-01 NOTE — Progress Notes (Signed)
BH MD/PA/NP OP Progress Note  11/01/2016 3:03 PM David Duncan  MRN:  [id number]  Subjective:  Patient is a 83 year old male who presented for the follow-up. He was recently diagnosed with acute on chronic congestive heart failure. Patient was walking with help of walker. He reported that he continues to have severe depression and was asking for samples of Deplin. He reported that his wife is helping him. He appeared much calmer and alert than his previous others. He currently denied having any suicidal homicidal ideations or plans. He reported that his wife is helpful. He is able to take his medications as prescribed. He denied having any acute symptoms at this time.   Chief Complaint:  Chief Complaint    Follow-up     Visit Diagnosis:     ICD-10-CM   1. Depression, major, recurrent, moderate ([place]) F33.1   2. Cognitive decline R41.89     Past Medical History:  Past Medical History:  Diagnosis Date  . Anxiety   . Atrial fibrillation ([place])   . Basal cell cancer 2008   on shoulder  . Bipolar disorder ([place])    hospitalized in past  . CVA (cerebral vascular accident) ([place]) 8/11  . Generalized anxiety disorder   . GERD (gastroesophageal reflux disease)   . History of kidney stones    30 years ago  . Macular degeneration   . Melanoma ([place])    resected from scalp approximately 1 year ago.   . Osteoarthritis    in neck and left knee  . Tremor    noted when writing    Past Surgical History:  Procedure Laterality Date  . BACK SURGERY  1991  . CATARACT EXTRACTION W/ INTRAOCULAR LENS IMPLANT Bilateral   . KNEE ARTHROPLASTY Left 12/03/2015   Procedure: COMPUTER ASSISTED TOTAL KNEE ARTHROPLASTY;  Surgeon: Dereck Leep, MD;  Location: [facility] ORS;  Service: Orthopedics;  Laterality: Left;  . SKIN CANCER EXCISION     Family History:  Family History  Problem Relation Age of Onset  . Stroke Father   . Cancer Brother        prostate  . Coronary artery disease Brother   .  Hypertension Brother    Social History:  Social History   Social History  . Marital status: Married    Spouse name: N/A  . Number of children: N/A  . Years of education: N/A   Occupational History  . retired     used to work on farm, Press photographer, Cabin crew   Social History Main Topics  . Smoking status: Never Smoker  . Smokeless tobacco: Never Used  . Alcohol use No  . Drug use: No  . Sexual activity: No   Other Topics Concern  . None   Social History Narrative   Moved from [place] 3/10   Walks 30-40 minutes daily   Additional History:   Atrial fibrillation (CMS-HCC)  . Bipolar affective disorder (CMS-HCC)  . CVA (cerebral vascular accident) ([place]) 11/2009  . Depression  . Dilated cardiomyopathy (CMS-HCC)  . Dyslipidemia  . GAD (generalized anxiety disorder)  . GERD (gastroesophageal reflux disease)  . History of basal cell cancer 2008  shoulder  . History of kidney stones  . Macular degeneration  . Melanoma (CMS-HCC)  on Scalp  . Osteoarthritis  . Stroke (CMS-HCC)  11/2009  . VHD (valvular heart disease)       Assessment:    Musculoskeletal: Strength & Muscle Tone: within normal limits Gait & Station: normal Patient leans:  N/A  Psychiatric Specialty Exam: Medication Refill   Anxiety  Patient reports no insomnia, nervous/anxious behavior or suicidal ideas.    Depression         Associated symptoms include does not have insomnia and no suicidal ideas.  Past medical history includes anxiety.     Review of Systems  Psychiatric/Behavioral: Negative for depression, hallucinations, memory loss, substance abuse and suicidal ideas. The patient is not nervous/anxious and does not have insomnia.   All other systems reviewed and are negative.   Blood pressure 108/62, pulse 79, height 5\' 6"  (1.676 m), weight 157 lb (71.2 kg).Body mass index is 25.34 kg/m.  General Appearance: Well Groomed and slow gait  Eye Contact:  Good  Speech:  Normal Rate and  Slow  Volume:  Normal  Mood:  Anxious  Affect:  Appropriate  Thought Process:  Linear and Logical  Orientation:  Full (Time, Place, and Person)  Thought Content:  Negative  Suicidal Thoughts:  No  Homicidal Thoughts:  No  Memory:  Immediate;   Good Recent;   Good Remote;   Good  Judgement:  Good  Insight:  Good  Psychomotor Activity:  Normal  Concentration:  Good  Recall:  Good  Fund of Knowledge: Good  Language: Good  Akathisia:  Negative  Handed:  Right unknown  AIMS (if indicated):  No done  Assets:  Communication Skills Desire for Improvement  ADL's:  Intact  Cognition: WNL  Sleep:  good   Is the patient at risk to self?  No. Has the patient been a risk to self in the past 6 months?  No. Has the patient been a risk to self within the distant past?  No. Is the patient a risk to others?  No. Has the patient been a risk to others in the past 6 months?  No. Has the patient been a risk to others within the distant past?  No.  Current Medications: Current Outpatient Prescriptions  Medication Sig Dispense Refill  . Acetaminophen 500 MG coapsule Take 1 capsule by mouth every 4 (four) hours as needed for fever.    . Amino Acids (FREE FORM AMINO ACID COMPLEX PO) Take 1 capsule by mouth daily.    . Ascorbic Acid (VITAMIN C) 500 MG tablet Take 500 mg by mouth 2 (two) times daily.     . B Complex Vitamins (B COMPLEX PO) Take by mouth 2 (two) times daily.     . Calcium-Magnesium-Vitamin D (CALCIUM 500 PO) Take 2 tablets by mouth daily.    . Cyanocobalamin (VITAMIN B-12) 2500 MCG SUBL Place 1 tablet under the tongue daily. Take 2 tabs by mouth daily     . diclofenac sodium (VOLTAREN) 1 % GEL Apply 2 g topically as needed.    [name] Kitchen DIPHENHYDRAMINE HCL, SLEEP, PO Take 30 mLs by mouth as needed (Sleep).    [name] Kitchen escitalopram (LEXAPRO) 20 MG tablet Take 1 tablet (20 mg total) by mouth every morning. 90 tablet 1  . furosemide (LASIX) 20 MG tablet Take 20 mg by mouth daily as needed.     [name] Kitchen  L-Methylfolate-Algae (DEPLIN 15) 15-90.314 MG CAPS Take 15 mg by mouth every morning. 28 capsule 0  . loperamide (IMODIUM) 2 MG capsule Take 2 mg by mouth as needed for diarrhea or loose stools.    . Lutein 6 MG CAPS Take 6 mg by mouth daily.     . metoprolol tartrate (LOPRESSOR) 25 MG tablet Take 50 mg by mouth 2 (two) times daily.     [name] Kitchen  Multiple Vitamin (MULTIVITAMIN) capsule Take 1 capsule by mouth daily.    . NON FORMULARY Take 2 capsules by mouth daily.    . sacubitril-valsartan (ENTRESTO) 24-26 MG Take 1 tablet by mouth 2 (two) times daily. 60 tablet 3  . Specialty Vitamins Products (ICAPS LUTEIN-ZEAXANTHIN PO) Take 2 capsules by mouth daily.    [name] Kitchen Specialty Vitamins Products (PROSTATE PO) Take 1 tablet by mouth 2 (two) times daily.    [name] Kitchen warfarin (COUMADIN) 2.5 MG tablet Take 2.5 mg by mouth. Take 1 tablet daily 4 days a week and the other 3 days, he takes 5 mg     No current facility-administered medications for this visit.     Medical Decision Making:  Established Problem, Stable/Improving (1)  Treatment Plan Summary:Medication management We discussed at length about the medications treatment risks benefits and alternatives  Continue Lexapro 20 mg daily. Continue Deplin 15 mg one pill daily And advised patient to come back for the samples as we do not have any samples at this time.   Follow-up in 2 months or earlier depending on his symptoms   More than 50% of the time spent in psychoeducation, counseling and coordination of care.    This note was generated in part or whole with voice recognition software. Voice regonition is usually quite accurate but there are transcription errors that can and very often do occur. I apologize for any typographical errors that were not detected and corrected.     Rainey Pines, MD  11/01/2016, 3:03 PM

## 2016-11-12 ENCOUNTER — Ambulatory Visit: Payer: PPO | Attending: Family | Admitting: Family

## 2016-11-12 ENCOUNTER — Encounter: Payer: Self-pay | Admitting: Family

## 2016-11-12 VITALS — BP 116/66 | HR 80 | Resp 18 | Ht 66.0 in | Wt 155.1 lb

## 2016-11-12 DIAGNOSIS — Z823 Family history of stroke: Secondary | ICD-10-CM | POA: Insufficient documentation

## 2016-11-12 DIAGNOSIS — Z87442 Personal history of urinary calculi: Secondary | ICD-10-CM | POA: Insufficient documentation

## 2016-11-12 DIAGNOSIS — Z8042 Family history of malignant neoplasm of prostate: Secondary | ICD-10-CM | POA: Diagnosis not present

## 2016-11-12 DIAGNOSIS — I482 Chronic atrial fibrillation, unspecified: Secondary | ICD-10-CM

## 2016-11-12 DIAGNOSIS — I5022 Chronic systolic (congestive) heart failure: Secondary | ICD-10-CM | POA: Diagnosis not present

## 2016-11-12 DIAGNOSIS — Z8249 Family history of ischemic heart disease and other diseases of the circulatory system: Secondary | ICD-10-CM | POA: Diagnosis not present

## 2016-11-12 DIAGNOSIS — K219 Gastro-esophageal reflux disease without esophagitis: Secondary | ICD-10-CM | POA: Insufficient documentation

## 2016-11-12 DIAGNOSIS — Z96652 Presence of left artificial knee joint: Secondary | ICD-10-CM | POA: Insufficient documentation

## 2016-11-12 DIAGNOSIS — I959 Hypotension, unspecified: Secondary | ICD-10-CM | POA: Insufficient documentation

## 2016-11-12 DIAGNOSIS — I4891 Unspecified atrial fibrillation: Secondary | ICD-10-CM | POA: Diagnosis not present

## 2016-11-12 DIAGNOSIS — Z9841 Cataract extraction status, right eye: Secondary | ICD-10-CM | POA: Diagnosis not present

## 2016-11-12 DIAGNOSIS — Z7902 Long term (current) use of antithrombotics/antiplatelets: Secondary | ICD-10-CM | POA: Diagnosis not present

## 2016-11-12 DIAGNOSIS — F329 Major depressive disorder, single episode, unspecified: Secondary | ICD-10-CM | POA: Insufficient documentation

## 2016-11-12 DIAGNOSIS — Z85820 Personal history of malignant melanoma of skin: Secondary | ICD-10-CM | POA: Insufficient documentation

## 2016-11-12 DIAGNOSIS — H3530 Unspecified macular degeneration: Secondary | ICD-10-CM | POA: Diagnosis not present

## 2016-11-12 DIAGNOSIS — F419 Anxiety disorder, unspecified: Secondary | ICD-10-CM | POA: Diagnosis not present

## 2016-11-12 DIAGNOSIS — I950 Idiopathic hypotension: Secondary | ICD-10-CM

## 2016-11-12 DIAGNOSIS — Z9889 Other specified postprocedural states: Secondary | ICD-10-CM | POA: Insufficient documentation

## 2016-11-12 DIAGNOSIS — F319 Bipolar disorder, unspecified: Secondary | ICD-10-CM | POA: Diagnosis not present

## 2016-11-12 DIAGNOSIS — F32A Depression, unspecified: Secondary | ICD-10-CM | POA: Insufficient documentation

## 2016-11-12 DIAGNOSIS — M1990 Unspecified osteoarthritis, unspecified site: Secondary | ICD-10-CM | POA: Insufficient documentation

## 2016-11-12 DIAGNOSIS — Z9842 Cataract extraction status, left eye: Secondary | ICD-10-CM | POA: Insufficient documentation

## 2016-11-12 DIAGNOSIS — Z8673 Personal history of transient ischemic attack (TIA), and cerebral infarction without residual deficits: Secondary | ICD-10-CM | POA: Diagnosis not present

## 2016-11-12 NOTE — Patient Instructions (Signed)
Continue weighing daily and call for an overnight weight gain of > 2 pounds or a weekly weight gain of >5 pounds. 

## 2016-11-12 NOTE — Progress Notes (Signed)
Patient ID: David Duncan, male    DOB: October 16, 1930, 83 y.o.   MRN: [id number]  HPI  David Duncan is a 83 y/o male with a history of atrial fibrillation, basal cell cancer, bipolar, CVA, anxiety, GERD, macular degeneration, melanoma, osteoarthritis and chronic heart failure.   Last echo was done 07/15/16 and showed an EF of 20% along with mild AR and severe David/TR.  Patient has not been admitted in the last year.  He presents today for his follow-up visit with a chief complaint of minimal edema in his right lower leg. He describes this as chronic in nature having been present for several years with waxing/waning of severity. He says that it's gotten worse the last few days so he resumed his furosemide. He has associated fatigue along with this.   Past Medical History:  Diagnosis Date  . Anxiety   . Atrial fibrillation ([place])   . Basal cell cancer 2008   on shoulder  . Bipolar disorder ([place])    hospitalized in past  . CVA (cerebral vascular accident) ([place]) 8/11  . Generalized anxiety disorder   . GERD (gastroesophageal reflux disease)   . History of kidney stones    30 years ago  . Macular degeneration   . Melanoma ([place])    resected from scalp approximately 1 year ago.   . Osteoarthritis    in neck and left knee  . Tremor    noted when writing   Past Surgical History:  Procedure Laterality Date  . BACK SURGERY  1991  . CATARACT EXTRACTION W/ INTRAOCULAR LENS IMPLANT Bilateral   . KNEE ARTHROPLASTY Left 12/03/2015   Procedure: COMPUTER ASSISTED TOTAL KNEE ARTHROPLASTY;  Surgeon: Dereck Leep, MD;  Location: [facility] ORS;  Service: Orthopedics;  Laterality: Left;  . SKIN CANCER EXCISION     Family History  Problem Relation Age of Onset  . Stroke Father   . Cancer Brother        prostate  . Coronary artery disease Brother   . Hypertension Brother    Social History  Substance Use Topics  . Smoking status: Never Smoker  . Smokeless tobacco: Never Used  . Alcohol use No    No Known Allergies   Prior to Admission medications   Medication Sig Start Date End Date Taking? Authorizing Provider  Acetaminophen 500 MG coapsule Take 1 capsule by mouth every 4 (four) hours as needed for fever.   Yes [provider]  Amino Acids (FREE FORM AMINO ACID COMPLEX PO) Take 1 capsule by mouth daily.   Yes [provider]  Ascorbic Acid (VITAMIN C) 500 MG tablet Take 500 mg by mouth 2 (two) times daily.    Yes [provider]  B Complex Vitamins (B COMPLEX PO) Take by mouth 2 (two) times daily.    Yes [provider]  Calcium-Magnesium-Vitamin D (CALCIUM 500 PO) Take 2 tablets by mouth daily.   Yes [provider]  Cyanocobalamin (VITAMIN B-12) 2500 MCG SUBL Place 1 tablet under the tongue daily. Take 2 tabs by mouth daily    Yes [provider]  diclofenac sodium (VOLTAREN) 1 % GEL Apply 2 g topically as needed.   Yes [provider]  DIPHENHYDRAMINE HCL, SLEEP, PO Take 30 mLs by mouth as needed (Sleep).   Yes [provider]  escitalopram (LEXAPRO) 20 MG tablet Take 1 tablet (20 mg total) by mouth every morning. 11/01/16  Yes Rainey Pines, MD  furosemide (LASIX) 20 MG tablet  Take 20 mg by mouth daily as needed.    Yes [provider]  L-Methylfolate-Algae (DEPLIN 15) 15-90.314 MG CAPS Take 15 mg by mouth every morning. 08/02/16  Yes Rainey Pines, MD  loperamide (IMODIUM) 2 MG capsule Take 2 mg by mouth as needed for diarrhea or loose stools.   Yes [provider]  Lutein 6 MG CAPS Take 6 mg by mouth daily.    Yes [provider]  metoprolol tartrate (LOPRESSOR) 25 MG tablet Take 50 mg by mouth 2 (two) times daily.  08/05/15  Yes [provider]  Multiple Vitamin (MULTIVITAMIN) capsule Take 1 capsule by mouth daily.   Yes [provider]  NON FORMULARY Take 2 capsules by mouth daily.   Yes [provider]  sacubitril-valsartan (ENTRESTO) 24-26 MG Take 1  tablet by mouth 2 (two) times daily. 08/26/16  Yes Alisa Graff, FNP  Specialty Vitamins Products (ICAPS LUTEIN-ZEAXANTHIN PO) Take 2 capsules by mouth daily.   Yes [provider]  Specialty Vitamins Products (PROSTATE PO) Take 1 tablet by mouth 2 (two) times daily.   Yes [provider]  warfarin (COUMADIN) 2.5 MG tablet Take 2.5 mg by mouth. Take 1 tablet daily 4 days a week and the other 3 days, he takes 5 mg 09/10/14  Yes [provider]    Review of Systems  Constitutional: Positive for fatigue. Negative for appetite change.  HENT: Negative for congestion, rhinorrhea and sore throat.   Eyes: Negative.   Respiratory: Negative for cough, chest tightness and shortness of breath.   Cardiovascular: Positive for leg swelling (minimal). Negative for chest pain and palpitations.  Gastrointestinal: Negative for abdominal distention and abdominal pain.  Endocrine: Negative.   Genitourinary: Negative.   Musculoskeletal: Positive for arthralgias (right shoulder). Negative for back pain.  Skin: Negative.   Allergic/Immunologic: Negative.   Neurological: Negative for dizziness and light-headedness.  Hematological: Negative for adenopathy. Does not bruise/bleed easily.  Psychiatric/Behavioral: Negative for dysphoric mood and sleep disturbance (sleeping in recliner due to comfort). The patient is not nervous/anxious.    Vitals:   11/12/16 1309  BP: 116/66  Pulse: 80  Resp: 18  SpO2: 98%  Weight: 155 lb 2 oz (70.4 kg)  Height: 5\' 6"  (1.676 m)   Wt Readings from Last 3 Encounters:  11/12/16 155 lb 2 oz (70.4 kg)  11/01/16 157 lb (71.2 kg)  10/29/16 156 lb 8 oz (71 kg)    Lab Results  Component Value Date   CREATININE 0.99 02/06/2016   CREATININE 1.07 12/31/2015   CREATININE 0.95 12/05/2015   Physical Exam  Constitutional: He is oriented to person, place, and time. He appears well-developed and well-nourished.  HENT:  Head: Normocephalic and atraumatic.   Neck: Normal range of motion. Neck supple. No JVD present.  Cardiovascular: An irregular rhythm present. Tachycardia present.   Pulmonary/Chest: Effort normal. He has no wheezes. He has no rales.  Abdominal: Soft. He exhibits no distension. There is no tenderness.  Musculoskeletal: He exhibits edema (1+ pitting edema around right lower leg). He exhibits no tenderness.  Neurological: He is alert and oriented to person, place, and time.  Skin: Skin is warm and dry.  Psychiatric: He has a normal mood and affect. His behavior is normal. Thought content normal.  Nursing note and vitals reviewed.  Assessment & Plan:  1: Chronic heart failure with reduced ejection fraction- - NYHA class II - euvolemic  - weighing daily; reminded to call for an overnight weight gain  of >2 pounds or a weekly weight gain of >5 pounds.  - weight stable from last time he was here - doesn't use salt shaker. Discussed the importance of closely following a 2000mg  sodium diet.  - unsure of fluid intake. Discussed keeping his fluid intake to between 40-50 ounces of fluid daily - saw cardiologist Nehemiah Massed) 10/07/16 and returns to him October 2018 - BMP done 09/16/16 showed sodium 139, potassium 4.6 and GFR 53  2: Atrial fibrillation- - currently rate controlled on metoprolol and warfarin - INR on 10/21/16 was 2.5  3: Hypotension- - BP looks good; has recently resumed furosemide due to edema; plans to stop it once edema goes down - advised patient and wife to take the furosemide for above weight gain parameters, increased edema or shortness of breath - saw PCP Deborra Medina) 10/25/16  4: Depression- - saw psychiatry Gretel Acre) 11/01/16  Patient did not bring his medications nor a list. Each medication was verbally reviewed with the patient and he was encouraged to bring the bottles to every visit to confirm accuracy of list.  Return in 1 month or sooner for any questions/problems before then. Plan on rechecking BMP at that time

## 2016-11-16 DIAGNOSIS — G8929 Other chronic pain: Secondary | ICD-10-CM | POA: Diagnosis not present

## 2016-11-16 DIAGNOSIS — M25512 Pain in left shoulder: Secondary | ICD-10-CM | POA: Diagnosis not present

## 2016-11-16 DIAGNOSIS — M25511 Pain in right shoulder: Secondary | ICD-10-CM | POA: Diagnosis not present

## 2016-11-23 DIAGNOSIS — M25512 Pain in left shoulder: Secondary | ICD-10-CM | POA: Diagnosis not present

## 2016-11-23 DIAGNOSIS — G8929 Other chronic pain: Secondary | ICD-10-CM | POA: Diagnosis not present

## 2016-11-23 DIAGNOSIS — M25511 Pain in right shoulder: Secondary | ICD-10-CM | POA: Diagnosis not present

## 2016-11-23 DIAGNOSIS — Z7901 Long term (current) use of anticoagulants: Secondary | ICD-10-CM | POA: Diagnosis not present

## 2016-11-30 DIAGNOSIS — G8929 Other chronic pain: Secondary | ICD-10-CM | POA: Diagnosis not present

## 2016-11-30 DIAGNOSIS — M25511 Pain in right shoulder: Secondary | ICD-10-CM | POA: Diagnosis not present

## 2016-11-30 DIAGNOSIS — M25512 Pain in left shoulder: Secondary | ICD-10-CM | POA: Diagnosis not present

## 2016-11-30 DIAGNOSIS — Z5181 Encounter for therapeutic drug level monitoring: Secondary | ICD-10-CM | POA: Diagnosis not present

## 2016-11-30 DIAGNOSIS — Z7901 Long term (current) use of anticoagulants: Secondary | ICD-10-CM | POA: Diagnosis not present

## 2016-12-06 DIAGNOSIS — L308 Other specified dermatitis: Secondary | ICD-10-CM | POA: Diagnosis not present

## 2016-12-06 DIAGNOSIS — D485 Neoplasm of uncertain behavior of skin: Secondary | ICD-10-CM | POA: Diagnosis not present

## 2016-12-06 DIAGNOSIS — B029 Zoster without complications: Secondary | ICD-10-CM | POA: Diagnosis not present

## 2016-12-21 DIAGNOSIS — B029 Zoster without complications: Secondary | ICD-10-CM | POA: Diagnosis not present

## 2016-12-21 DIAGNOSIS — Z08 Encounter for follow-up examination after completed treatment for malignant neoplasm: Secondary | ICD-10-CM | POA: Diagnosis not present

## 2016-12-21 DIAGNOSIS — X32XXXA Exposure to sunlight, initial encounter: Secondary | ICD-10-CM | POA: Diagnosis not present

## 2016-12-21 DIAGNOSIS — Z85828 Personal history of other malignant neoplasm of skin: Secondary | ICD-10-CM | POA: Diagnosis not present

## 2016-12-21 DIAGNOSIS — L570 Actinic keratosis: Secondary | ICD-10-CM | POA: Diagnosis not present

## 2016-12-21 DIAGNOSIS — L821 Other seborrheic keratosis: Secondary | ICD-10-CM | POA: Diagnosis not present

## 2016-12-27 ENCOUNTER — Ambulatory Visit: Payer: PPO | Admitting: Psychiatry

## 2017-01-04 ENCOUNTER — Encounter: Payer: Self-pay | Admitting: Family

## 2017-01-04 ENCOUNTER — Ambulatory Visit: Payer: PPO | Attending: Family | Admitting: Family

## 2017-01-04 VITALS — BP 128/90 | HR 92 | Resp 20 | Ht 66.0 in | Wt 153.5 lb

## 2017-01-04 DIAGNOSIS — F319 Bipolar disorder, unspecified: Secondary | ICD-10-CM | POA: Insufficient documentation

## 2017-01-04 DIAGNOSIS — Z7901 Long term (current) use of anticoagulants: Secondary | ICD-10-CM | POA: Insufficient documentation

## 2017-01-04 DIAGNOSIS — I5022 Chronic systolic (congestive) heart failure: Secondary | ICD-10-CM | POA: Diagnosis not present

## 2017-01-04 DIAGNOSIS — M1990 Unspecified osteoarthritis, unspecified site: Secondary | ICD-10-CM | POA: Insufficient documentation

## 2017-01-04 DIAGNOSIS — I950 Idiopathic hypotension: Secondary | ICD-10-CM

## 2017-01-04 DIAGNOSIS — F329 Major depressive disorder, single episode, unspecified: Secondary | ICD-10-CM

## 2017-01-04 DIAGNOSIS — I509 Heart failure, unspecified: Secondary | ICD-10-CM | POA: Diagnosis not present

## 2017-01-04 DIAGNOSIS — K219 Gastro-esophageal reflux disease without esophagitis: Secondary | ICD-10-CM | POA: Diagnosis not present

## 2017-01-04 DIAGNOSIS — I4891 Unspecified atrial fibrillation: Secondary | ICD-10-CM | POA: Insufficient documentation

## 2017-01-04 DIAGNOSIS — Z8673 Personal history of transient ischemic attack (TIA), and cerebral infarction without residual deficits: Secondary | ICD-10-CM | POA: Insufficient documentation

## 2017-01-04 DIAGNOSIS — I959 Hypotension, unspecified: Secondary | ICD-10-CM | POA: Insufficient documentation

## 2017-01-04 DIAGNOSIS — I482 Chronic atrial fibrillation, unspecified: Secondary | ICD-10-CM

## 2017-01-04 MED ORDER — METOPROLOL SUCCINATE ER 100 MG PO TB24: 100.0000 mg | ORAL_TABLET | Freq: Every day | ORAL | 3 refills | Status: AC

## 2017-01-04 NOTE — Progress Notes (Signed)
Agree with pharmacist note below.  Physical exam and ROS were done by myself.  Discussed changing his beta-blocker to metoprolol succinate and the rationale behind doing so. Patient is agreeable so he will switch his BID metoprolol to metoprolol succinate 100mg  daily as he was taking 50mg  twice daily.   Getting his INR checked today and would like his BMP to be drawn at the same time. BMP order printed and sent with patient to see if they can draw it all at the same time.  Return here in 4 months or sooner for any questions/problems before then.  Darylene Price, FNP [facility]    Patient ID: David Duncan, male    DOB: [birth year], 83 y.o.   MRN: [id number]  HPI  David Duncan is a 83 y/o male with a history of atrial fibrillation, basal cell cancer, bipolar, CVA, anxiety, GERD, macular degeneration, melanoma, osteoarthritis and chronic heart failure.   Last echo was done 07/15/16 and showed an EF of 20% along with mild AR and severe David/TR.  Patient has not been admitted in the last year.  Patient and wife present today for follow-up visit with a chief complaint of ongoing depression and difficulty sleeping. Says the Lexapro he has been on for a while is not working. Has a follow up appointment with Psychiatrist in the next couple of weeks. Denies any shortness of breath, edema and takes his Lasix as needed. Does not weigh daily and follows a low sodium diet.   Past Medical History:  Diagnosis Date  . Anxiety   . Arrhythmia   . Atrial fibrillation ([place])   . Basal cell cancer 2008   on shoulder  . Bipolar disorder [facility])    hospitalized in past  . CHF (congestive heart failure) ([place])   . CVA (cerebral vascular accident) ([place]) 8/11  . Generalized anxiety disorder   . GERD (gastroesophageal reflux disease)   . History of kidney stones    30 years ago  . Macular degeneration   . Melanoma ([place])    resected from scalp approximately 1 year ago.   . Osteoarthritis    in neck and  left knee  . Tremor    noted when writing   Past Surgical History:  Procedure Laterality Date  . BACK SURGERY  1991  . CATARACT EXTRACTION W/ INTRAOCULAR LENS IMPLANT Bilateral   . KNEE ARTHROPLASTY Left 12/03/2015   Procedure: COMPUTER ASSISTED TOTAL KNEE ARTHROPLASTY;  Surgeon: Dereck Leep, MD;  Location: [facility] ORS;  Service: Orthopedics;  Laterality: Left;  . SKIN CANCER EXCISION     Family History  Problem Relation Age of Onset  . Stroke Father   . Cancer Brother        prostate  . Coronary artery disease Brother   . Hypertension Brother    Social History  Substance Use Topics  . Smoking status: Never Smoker  . Smokeless tobacco: Never Used  . Alcohol use No   No Known Allergies   Prior to Admission medications   Medication Sig Start Date End Date Taking? Authorizing Provider  Acetaminophen 500 MG coapsule Take 1 capsule by mouth every 4 (four) hours as needed for fever.   Yes [provider]  Amino Acids (FREE FORM AMINO ACID COMPLEX PO) Take 1 capsule by mouth daily.   Yes [provider]  Ascorbic Acid (VITAMIN C) 500 MG tablet Take 500 mg by mouth 2 (two) times daily.    Yes [provider]  B Complex Vitamins (B COMPLEX PO) Take by mouth 2 (two) times daily.    Yes [provider]  Calcium-Magnesium-Vitamin D (CALCIUM 500 PO) Take 2 tablets by mouth daily.   Yes [provider]  Cyanocobalamin (VITAMIN B-12) 2500 MCG SUBL Place 1 tablet under the tongue daily. Take 2 tabs by mouth daily    Yes [provider]  diclofenac sodium (VOLTAREN) 1 % GEL Apply 2 g topically as needed.   Yes [provider]  DIPHENHYDRAMINE HCL, SLEEP, PO Take 30 mLs by mouth as needed (Sleep).   Yes [provider]  escitalopram (LEXAPRO) 20 MG tablet Take 1 tablet (20 mg total) by mouth every morning. 11/01/16  Yes Rainey Pines, MD  furosemide (LASIX) 20 MG tablet Take 20 mg by mouth daily as needed.    Yes [provider]  L-Methylfolate-Algae (DEPLIN 15) 15-90.314 MG CAPS Take 15 mg by mouth every morning. 08/02/16  Yes Rainey Pines, MD  loperamide (IMODIUM) 2 MG capsule Take 2 mg by mouth as needed for diarrhea or loose stools.   Yes [provider]  Lutein 6 MG CAPS Take 6 mg by mouth daily.    Yes [provider]  metoprolol tartrate (LOPRESSOR) 25 MG tablet Take 50 mg by mouth 2 (two) times daily.  08/05/15  Yes [provider]  Multiple Vitamin (MULTIVITAMIN) capsule Take 1 capsule by mouth daily.   Yes [provider]  NON FORMULARY Take 2 capsules by mouth daily.   Yes [provider]  sacubitril-valsartan (ENTRESTO) 24-26 MG Take 1 tablet by mouth 2 (two) times daily. 08/26/16  Yes Alisa Graff, FNP  Specialty Vitamins Products (ICAPS LUTEIN-ZEAXANTHIN PO) Take 2 capsules by mouth daily.   Yes [provider]  Specialty Vitamins Products (PROSTATE PO) Take 1 tablet by mouth 2 (two) times daily.   Yes [provider]  warfarin (COUMADIN) 2.5 MG tablet Take 2.5 mg by mouth. Take 1 tablet daily 4 days a week and the other 3 days, he takes 5 mg 09/10/14  Yes [provider]    Review of Systems  Constitutional: Positive for fatigue. Negative for appetite change.  HENT: Negative for congestion, rhinorrhea and sore throat.   Eyes: Negative.   Respiratory: Negative for cough, chest tightness and shortness of breath.   Cardiovascular: Negative for chest pain, palpitations and leg swelling.  Gastrointestinal: Negative for abdominal distention.  Endocrine: Negative.   Genitourinary: Negative.   Musculoskeletal: Positive for arthralgias (right shoulder). Negative for back pain.  Skin: Negative.   Allergic/Immunologic: Negative.   Neurological: Positive for light-headedness. Negative for dizziness.  Hematological: Negative for adenopathy. Does not bruise/bleed easily.  Psychiatric/Behavioral: Positive for sleep disturbance  (now sleeping in the bed but still has difficullty sleeping well). Negative for dysphoric mood. The patient is not nervous/anxious.    Vitals:   01/04/17 1334  BP: 128/90  Pulse: 92  Resp: 20  SpO2: 99%  Weight: 153 lb 8 oz (69.6 kg)  Height: 5\' 6"  (1.676 m)   Wt Readings from Last 3 Encounters:  01/04/17 153 lb 8 oz (69.6 kg)  11/12/16 155 lb 2 oz (70.4 kg)  11/01/16 157 lb (71.2 kg)    Lab Results  Component Value Date   CREATININE 0.99 02/06/2016   CREATININE 1.07 12/31/2015   CREATININE 0.95 12/05/2015   Physical Exam  Constitutional: He is oriented to person, place, and time. He appears well-developed and well-nourished.  HENT:  Head: Normocephalic and  atraumatic.  Neck: Normal range of motion. Neck supple. No JVD present.  Cardiovascular: An irregular rhythm present. Tachycardia present.   Pulmonary/Chest: Effort normal. He has no wheezes. He has no rales.  Abdominal: Soft. He exhibits no distension. There is no tenderness.  Musculoskeletal: He exhibits no edema or tenderness.  Neurological: He is alert and oriented to person, place, and time.  Skin: Skin is warm and dry.  Psychiatric: He has a normal mood and affect. His behavior is normal. Thought content normal.  Nursing note and vitals reviewed.  Assessment & Plan:  1: Chronic heart failure with reduced ejection fraction- - NYHA class II - euvolemic  - Is not weighing daily; reminded to call for an overnight weight gain of >2 pounds or a weekly weight gain of >5 pounds.  - Weight stable since last visit  - doesn't use salt shaker. Discussed the importance of closely following a 2000mg  sodium diet.  - He says he thinks he is drinking enough fluids daily; Discussed keeping his fluid intake to between 40-50 ounces of fluid daily - Changed metoprolol tartrate 50 mg twice daily to metoprolol succinate 100 mg daily; heart rate today 92 bpm  - saw cardiologist Nehemiah Massed) 10/07/16 and returns to him October 2018 - BMP  done 09/16/16 showed sodium 139, potassium 4.6 and GFR 53 - Check BMP today   2: Atrial fibrillation- - currently rate controlled on metoprolol and warfarin - INR on 11/30/16 was 2.9  3: Hypotension- - BP looks good; has not needed furosemide for edema  - advised patient to take furosemide when increased edema or shortness of breath - saw PCP Deborra Medina) 10/25/16  4: Depression- - Patient asked about possibly decreasing Lexapro dose and starting something new for his depression; patient to follow up with psychiatry in a couple weeks - saw psychiatry Gretel Acre) 11/01/16  Patient did not bring his medications nor a list. Each medication was verbally reviewed with the patient and he was encouraged to bring the bottles to every visit to confirm accuracy of list.  Return in 4 months or sooner for any questions/problems before then.   Candelaria Stagers, PharmD  Pharmacy Resident  01/04/17

## 2017-01-04 NOTE — Patient Instructions (Signed)
Resume weighing daily and call for an overnight weight gain of > 2 pounds or a weekly weight gain of >5 pounds.  Will be stopping your metoprolol tartrate twice daily and replacing it with metoprolol succinate 100mg  daily.

## 2017-01-06 DIAGNOSIS — G8929 Other chronic pain: Secondary | ICD-10-CM | POA: Diagnosis not present

## 2017-01-06 DIAGNOSIS — M25512 Pain in left shoulder: Secondary | ICD-10-CM | POA: Diagnosis not present

## 2017-01-06 DIAGNOSIS — M25511 Pain in right shoulder: Secondary | ICD-10-CM | POA: Diagnosis not present

## 2017-01-11 DIAGNOSIS — M25512 Pain in left shoulder: Secondary | ICD-10-CM | POA: Diagnosis not present

## 2017-01-11 DIAGNOSIS — M25511 Pain in right shoulder: Secondary | ICD-10-CM | POA: Diagnosis not present

## 2017-01-11 DIAGNOSIS — G8929 Other chronic pain: Secondary | ICD-10-CM | POA: Diagnosis not present

## 2017-01-14 DIAGNOSIS — M25512 Pain in left shoulder: Secondary | ICD-10-CM | POA: Diagnosis not present

## 2017-01-14 DIAGNOSIS — M25511 Pain in right shoulder: Secondary | ICD-10-CM | POA: Diagnosis not present

## 2017-01-14 DIAGNOSIS — G8929 Other chronic pain: Secondary | ICD-10-CM | POA: Diagnosis not present

## 2017-01-17 ENCOUNTER — Ambulatory Visit (INDEPENDENT_AMBULATORY_CARE_PROVIDER_SITE_OTHER): Payer: PPO | Admitting: Psychiatry

## 2017-01-17 ENCOUNTER — Encounter: Payer: Self-pay | Admitting: Psychiatry

## 2017-01-17 VITALS — BP 105/72 | HR 96 | Temp 97.6°F | Wt 155.2 lb

## 2017-01-17 DIAGNOSIS — R4189 Other symptoms and signs involving cognitive functions and awareness: Secondary | ICD-10-CM | POA: Diagnosis not present

## 2017-01-17 DIAGNOSIS — F331 Major depressive disorder, recurrent, moderate: Secondary | ICD-10-CM

## 2017-01-17 MED ORDER — ESCITALOPRAM OXALATE 20 MG PO TABS: 20.0000 mg | ORAL_TABLET | ORAL | 1 refills | Status: DC

## 2017-01-17 MED ORDER — MIRTAZAPINE 15 MG PO TABS
15.0000 mg | ORAL_TABLET | Freq: Every day | ORAL | 1 refills | Status: DC
Start: 2017-01-17 — End: 2017-05-02

## 2017-01-17 NOTE — Progress Notes (Signed)
BH MD/PA/NP OP Progress Note  01/17/2017 3:29 PM David Duncan  MRN:  [id number]  Subjective:  Patient is a 83 year old male who presented for the follow-up. He reported that he is feeling depressed and has been chronic. Patient reported that he is not improving. He stated that he does not sleep well at night and has been taking Tylenol and melatonin at night. We discussed about his medications. He wants something to help him sleep at night. He has been talking in soft tone of voice. He appears age appropriate. He stated that he has cardiac problems and had a stroke 5 years ago in the past. His wife is helping him at home. He reported that he spends time watching TV. He currently denied having any suicidal homicidal ideations or plans.   Chief Complaint:  Chief Complaint    Follow-up; Medication Refill     Visit Diagnosis:     ICD-10-CM   1. Depression, major, recurrent, moderate ([place]) F33.1   2. Cognitive decline R41.89     Past Medical History:  Past Medical History:  Diagnosis Date  . Anxiety   . Arrhythmia   . Atrial fibrillation ([place])   . Basal cell cancer 2008   on shoulder  . Bipolar disorder [facility])    hospitalized in past  . CHF (congestive heart failure) ([place])   . CVA (cerebral vascular accident) ([place]) 8/11  . Generalized anxiety disorder   . GERD (gastroesophageal reflux disease)   . History of kidney stones    30 years ago  . Macular degeneration   . Melanoma ([place])    resected from scalp approximately 1 year ago.   . Osteoarthritis    in neck and left knee  . Tremor    noted when writing    Past Surgical History:  Procedure Laterality Date  . BACK SURGERY  1991  . CATARACT EXTRACTION W/ INTRAOCULAR LENS IMPLANT Bilateral   . KNEE ARTHROPLASTY Left 12/03/2015   Procedure: COMPUTER ASSISTED TOTAL KNEE ARTHROPLASTY;  Surgeon: Dereck Leep, MD;  Location: [facility] ORS;  Service: Orthopedics;  Laterality: Left;  . SKIN CANCER EXCISION     Family History:   Family History  Problem Relation Age of Onset  . Stroke Father   . Cancer Brother        prostate  . Coronary artery disease Brother   . Hypertension Brother    Social History:  Social History   Social History  . Marital status: Married    Spouse name: N/A  . Number of children: N/A  . Years of education: N/A   Occupational History  . retired     used to work on farm, Press photographer, Cabin crew   Social History Main Topics  . Smoking status: Never Smoker  . Smokeless tobacco: Never Used  . Alcohol use No  . Drug use: No  . Sexual activity: No   Other Topics Concern  . None   Social History Narrative   Moved from [place] 3/10   Walks 30-40 minutes daily   Additional History:   Atrial fibrillation (CMS-HCC)  . Bipolar affective disorder (CMS-HCC)  . CVA (cerebral vascular accident) ([place]) 11/2009  . Depression  . Dilated cardiomyopathy (CMS-HCC)  . Dyslipidemia  . GAD (generalized anxiety disorder)  . GERD (gastroesophageal reflux disease)  . History of basal cell cancer 2008  shoulder  . History of kidney stones  . Macular degeneration  . Melanoma (CMS-HCC)  on Scalp  . Osteoarthritis  . Stroke (  CMS-HCC)  11/2009  . VHD (valvular heart disease)       Assessment:    Musculoskeletal: Strength & Muscle Tone: within normal limits Gait & Station: normal Patient leans: N/A  Psychiatric Specialty Exam: Medication Refill   Anxiety  Patient reports no insomnia, nervous/anxious behavior or suicidal ideas.    Depression         Associated symptoms include does not have insomnia and no suicidal ideas.  Past medical history includes anxiety.     Review of Systems  Psychiatric/Behavioral: Negative for depression, hallucinations, memory loss, substance abuse and suicidal ideas. The patient is not nervous/anxious and does not have insomnia.   All other systems reviewed and are negative.   Blood pressure 105/72, pulse 96, temperature 97.6 F (36.4 C),  temperature source Oral, weight 155 lb 3.2 oz (70.4 kg).Body mass index is 25.05 kg/m.  General Appearance: Well Groomed and slow gait  Eye Contact:  Good  Speech:  Normal Rate and Slow  Volume:  Normal  Mood:  Anxious  Affect:  Appropriate  Thought Process:  Linear and Logical  Orientation:  Full (Time, Place, and Person)  Thought Content:  Negative  Suicidal Thoughts:  No  Homicidal Thoughts:  No  Memory:  Immediate;   Good Recent;   Good Remote;   Good  Judgement:  Good  Insight:  Good  Psychomotor Activity:  Normal  Concentration:  Fair  Recall:  [employer] of Knowledge: Fair  Language: Fair  Akathisia:  Negative  Handed:  Right unknown  AIMS (if indicated):  No done  Assets:  Communication Skills Desire for Improvement  ADL's:  Intact  Cognition: WNL  Sleep:  good   Is the patient at risk to self?  No. Has the patient been a risk to self in the past 6 months?  No. Has the patient been a risk to self within the distant past?  No. Is the patient a risk to others?  No. Has the patient been a risk to others in the past 6 months?  No. Has the patient been a risk to others within the distant past?  No.  Current Medications: Current Outpatient Prescriptions  Medication Sig Dispense Refill  . Acetaminophen 500 MG coapsule Take 1 capsule by mouth every 4 (four) hours as needed for fever.    . Amino Acids (FREE FORM AMINO ACID COMPLEX PO) Take 1 capsule by mouth daily.    . Ascorbic Acid (VITAMIN C) 500 MG tablet Take 500 mg by mouth 2 (two) times daily.     . B Complex Vitamins (B COMPLEX PO) Take by mouth 2 (two) times daily.     . Calcium-Magnesium-Vitamin D (CALCIUM 500 PO) Take 2 tablets by mouth daily.    . Cyanocobalamin (VITAMIN B-12) 2500 MCG SUBL Place 1 tablet under the tongue daily. Take 2 tabs by mouth daily     . diclofenac sodium (VOLTAREN) 1 % GEL Apply 2 g topically as needed.    [name] Kitchen escitalopram (LEXAPRO) 20 MG tablet Take 1 tablet (20 mg total) by mouth  every morning. 90 tablet 1  . furosemide (LASIX) 20 MG tablet Take 20 mg by mouth daily as needed.     [name] Kitchen L-Methylfolate-Algae (DEPLIN 15) 15-90.314 MG CAPS Take 15 mg by mouth every morning. 28 capsule 0  . loperamide (IMODIUM) 2 MG capsule Take 2 mg by mouth as needed for diarrhea or loose stools.    . Lutein 6 MG CAPS Take 6 mg by  mouth daily.     . metoprolol succinate (TOPROL-XL) 100 MG 24 hr tablet Take 1 tablet (100 mg total) by mouth daily. Take with or immediately following a meal. 90 tablet 3  . mirtazapine (REMERON) 15 MG tablet Take 1 tablet (15 mg total) by mouth at bedtime. 30 tablet 1  . Multiple Vitamin (MULTIVITAMIN) capsule Take 1 capsule by mouth daily.    . NON FORMULARY Take 2 capsules by mouth daily.    . sacubitril-valsartan (ENTRESTO) 24-26 MG Take 1 tablet by mouth 2 (two) times daily. 60 tablet 3  . Specialty Vitamins Products (ICAPS LUTEIN-ZEAXANTHIN PO) Take 2 capsules by mouth daily.    [name] Kitchen Specialty Vitamins Products (PROSTATE PO) Take 1 tablet by mouth 2 (two) times daily.    [name] Kitchen warfarin (COUMADIN) 2.5 MG tablet Take 2.5 mg by mouth. Take 1 tablet daily 4 days a week and the other 3 days, he takes 5 mg     No current facility-administered medications for this visit.     Medical Decision Making:  Established Problem, Stable/Improving (1)  Treatment Plan Summary:Medication management We discussed at length about the medications treatment risks benefits and alternatives  Continue Lexapro 20 mg daily. I will start him on Remeron 15 mg at bedtime. Advised patient to stop taking the Tylenol and the melatonin at night and he agreed with the plan. Continue Deplin 15 mg one pill daily and patient will be given samples   Follow-up in 2 months or earlier depending on his symptoms   More than 50% of the time spent in psychoeducation, counseling and coordination of care.    This note was generated in part or whole with voice recognition software. Voice regonition is  usually quite accurate but there are transcription errors that can and very often do occur. I apologize for any typographical errors that were not detected and corrected.     Rainey Pines, MD  01/17/2017, 3:29 PM

## 2017-01-18 ENCOUNTER — Telehealth: Payer: Self-pay

## 2017-01-18 DIAGNOSIS — G8929 Other chronic pain: Secondary | ICD-10-CM | POA: Diagnosis not present

## 2017-01-18 DIAGNOSIS — M25512 Pain in left shoulder: Secondary | ICD-10-CM | POA: Diagnosis not present

## 2017-01-18 DIAGNOSIS — M25511 Pain in right shoulder: Secondary | ICD-10-CM | POA: Diagnosis not present

## 2017-01-18 NOTE — Telephone Encounter (Signed)
Advised per Darylene Price FNP that all labs on 01/04/2017 look fine.

## 2017-01-21 DIAGNOSIS — M25512 Pain in left shoulder: Secondary | ICD-10-CM | POA: Diagnosis not present

## 2017-01-21 DIAGNOSIS — M25511 Pain in right shoulder: Secondary | ICD-10-CM | POA: Diagnosis not present

## 2017-01-21 DIAGNOSIS — G8929 Other chronic pain: Secondary | ICD-10-CM | POA: Diagnosis not present

## 2017-01-25 DIAGNOSIS — G8929 Other chronic pain: Secondary | ICD-10-CM | POA: Diagnosis not present

## 2017-01-25 DIAGNOSIS — M25511 Pain in right shoulder: Secondary | ICD-10-CM | POA: Diagnosis not present

## 2017-01-25 DIAGNOSIS — M25512 Pain in left shoulder: Secondary | ICD-10-CM | POA: Diagnosis not present

## 2017-01-28 DIAGNOSIS — M25511 Pain in right shoulder: Secondary | ICD-10-CM | POA: Diagnosis not present

## 2017-01-28 DIAGNOSIS — M25512 Pain in left shoulder: Secondary | ICD-10-CM | POA: Diagnosis not present

## 2017-01-28 DIAGNOSIS — G8929 Other chronic pain: Secondary | ICD-10-CM | POA: Diagnosis not present

## 2017-02-01 DIAGNOSIS — M25512 Pain in left shoulder: Secondary | ICD-10-CM | POA: Diagnosis not present

## 2017-02-01 DIAGNOSIS — G8929 Other chronic pain: Secondary | ICD-10-CM | POA: Diagnosis not present

## 2017-02-01 DIAGNOSIS — M25511 Pain in right shoulder: Secondary | ICD-10-CM | POA: Diagnosis not present

## 2017-02-01 DIAGNOSIS — Z7901 Long term (current) use of anticoagulants: Secondary | ICD-10-CM | POA: Diagnosis not present

## 2017-02-03 DIAGNOSIS — M25511 Pain in right shoulder: Secondary | ICD-10-CM | POA: Diagnosis not present

## 2017-02-03 DIAGNOSIS — M25512 Pain in left shoulder: Secondary | ICD-10-CM | POA: Diagnosis not present

## 2017-02-03 DIAGNOSIS — G8929 Other chronic pain: Secondary | ICD-10-CM | POA: Diagnosis not present

## 2017-02-08 DIAGNOSIS — I5022 Chronic systolic (congestive) heart failure: Secondary | ICD-10-CM | POA: Diagnosis not present

## 2017-02-08 DIAGNOSIS — I420 Dilated cardiomyopathy: Secondary | ICD-10-CM | POA: Diagnosis not present

## 2017-02-08 DIAGNOSIS — M25512 Pain in left shoulder: Secondary | ICD-10-CM | POA: Diagnosis not present

## 2017-02-08 DIAGNOSIS — I10 Essential (primary) hypertension: Secondary | ICD-10-CM | POA: Diagnosis not present

## 2017-02-08 DIAGNOSIS — M25511 Pain in right shoulder: Secondary | ICD-10-CM | POA: Diagnosis not present

## 2017-02-08 DIAGNOSIS — I482 Chronic atrial fibrillation: Secondary | ICD-10-CM | POA: Diagnosis not present

## 2017-02-08 DIAGNOSIS — R600 Localized edema: Secondary | ICD-10-CM | POA: Diagnosis not present

## 2017-02-08 DIAGNOSIS — I38 Endocarditis, valve unspecified: Secondary | ICD-10-CM | POA: Diagnosis not present

## 2017-02-08 DIAGNOSIS — I071 Rheumatic tricuspid insufficiency: Secondary | ICD-10-CM | POA: Diagnosis not present

## 2017-02-08 DIAGNOSIS — G8929 Other chronic pain: Secondary | ICD-10-CM | POA: Diagnosis not present

## 2017-02-08 DIAGNOSIS — I340 Nonrheumatic mitral (valve) insufficiency: Secondary | ICD-10-CM | POA: Diagnosis not present

## 2017-02-10 DIAGNOSIS — M25511 Pain in right shoulder: Secondary | ICD-10-CM | POA: Diagnosis not present

## 2017-02-10 DIAGNOSIS — M25512 Pain in left shoulder: Secondary | ICD-10-CM | POA: Diagnosis not present

## 2017-02-10 DIAGNOSIS — G8929 Other chronic pain: Secondary | ICD-10-CM | POA: Diagnosis not present

## 2017-02-15 DIAGNOSIS — Z7901 Long term (current) use of anticoagulants: Secondary | ICD-10-CM | POA: Diagnosis not present

## 2017-02-15 DIAGNOSIS — G8929 Other chronic pain: Secondary | ICD-10-CM | POA: Diagnosis not present

## 2017-02-15 DIAGNOSIS — M25512 Pain in left shoulder: Secondary | ICD-10-CM | POA: Diagnosis not present

## 2017-02-15 DIAGNOSIS — M25511 Pain in right shoulder: Secondary | ICD-10-CM | POA: Diagnosis not present

## 2017-02-17 DIAGNOSIS — M25511 Pain in right shoulder: Secondary | ICD-10-CM | POA: Diagnosis not present

## 2017-02-17 DIAGNOSIS — G8929 Other chronic pain: Secondary | ICD-10-CM | POA: Diagnosis not present

## 2017-02-17 DIAGNOSIS — M25512 Pain in left shoulder: Secondary | ICD-10-CM | POA: Diagnosis not present

## 2017-02-22 DIAGNOSIS — M542 Cervicalgia: Secondary | ICD-10-CM | POA: Diagnosis not present

## 2017-02-22 DIAGNOSIS — M25512 Pain in left shoulder: Secondary | ICD-10-CM | POA: Diagnosis not present

## 2017-02-22 DIAGNOSIS — M25511 Pain in right shoulder: Secondary | ICD-10-CM | POA: Diagnosis not present

## 2017-02-22 DIAGNOSIS — R29898 Other symptoms and signs involving the musculoskeletal system: Secondary | ICD-10-CM | POA: Diagnosis not present

## 2017-02-25 DIAGNOSIS — M25511 Pain in right shoulder: Secondary | ICD-10-CM | POA: Diagnosis not present

## 2017-02-25 DIAGNOSIS — M25512 Pain in left shoulder: Secondary | ICD-10-CM | POA: Diagnosis not present

## 2017-02-25 DIAGNOSIS — G8929 Other chronic pain: Secondary | ICD-10-CM | POA: Diagnosis not present

## 2017-03-01 DIAGNOSIS — M25511 Pain in right shoulder: Secondary | ICD-10-CM | POA: Diagnosis not present

## 2017-03-01 DIAGNOSIS — G8929 Other chronic pain: Secondary | ICD-10-CM | POA: Diagnosis not present

## 2017-03-01 DIAGNOSIS — M25512 Pain in left shoulder: Secondary | ICD-10-CM | POA: Diagnosis not present

## 2017-03-07 DIAGNOSIS — Z9841 Cataract extraction status, right eye: Secondary | ICD-10-CM | POA: Diagnosis not present

## 2017-03-07 DIAGNOSIS — Z9842 Cataract extraction status, left eye: Secondary | ICD-10-CM | POA: Diagnosis not present

## 2017-03-07 DIAGNOSIS — H353132 Nonexudative age-related macular degeneration, bilateral, intermediate dry stage: Secondary | ICD-10-CM | POA: Diagnosis not present

## 2017-03-08 DIAGNOSIS — M25511 Pain in right shoulder: Secondary | ICD-10-CM | POA: Diagnosis not present

## 2017-03-08 DIAGNOSIS — M25512 Pain in left shoulder: Secondary | ICD-10-CM | POA: Diagnosis not present

## 2017-03-08 DIAGNOSIS — G8929 Other chronic pain: Secondary | ICD-10-CM | POA: Diagnosis not present

## 2017-03-11 DIAGNOSIS — M25512 Pain in left shoulder: Secondary | ICD-10-CM | POA: Diagnosis not present

## 2017-03-11 DIAGNOSIS — G8929 Other chronic pain: Secondary | ICD-10-CM | POA: Diagnosis not present

## 2017-03-11 DIAGNOSIS — M25511 Pain in right shoulder: Secondary | ICD-10-CM | POA: Diagnosis not present

## 2017-04-14 DIAGNOSIS — I480 Paroxysmal atrial fibrillation: Secondary | ICD-10-CM | POA: Diagnosis not present

## 2017-04-15 DIAGNOSIS — M5416 Radiculopathy, lumbar region: Secondary | ICD-10-CM | POA: Diagnosis not present

## 2017-04-15 DIAGNOSIS — M5136 Other intervertebral disc degeneration, lumbar region: Secondary | ICD-10-CM | POA: Diagnosis not present

## 2017-04-15 DIAGNOSIS — M1610 Unilateral primary osteoarthritis, unspecified hip: Secondary | ICD-10-CM | POA: Diagnosis not present

## 2017-04-15 DIAGNOSIS — M545 Low back pain: Secondary | ICD-10-CM | POA: Diagnosis not present

## 2017-04-15 DIAGNOSIS — M47816 Spondylosis without myelopathy or radiculopathy, lumbar region: Secondary | ICD-10-CM | POA: Diagnosis not present

## 2017-04-18 ENCOUNTER — Ambulatory Visit: Payer: PPO | Admitting: Psychiatry

## 2017-04-26 ENCOUNTER — Emergency Department (HOSPITAL_COMMUNITY)
Admission: EM | Admit: 2017-04-26 | Discharge: 2017-04-26 | Disposition: A | Discharge status: Home / Self Care | Payer: PPO | Attending: Emergency Medicine | Admitting: Emergency Medicine

## 2017-04-26 ENCOUNTER — Encounter (HOSPITAL_COMMUNITY): Payer: Self-pay | Admitting: Emergency Medicine

## 2017-04-26 ENCOUNTER — Other Ambulatory Visit: Payer: Self-pay

## 2017-04-26 ENCOUNTER — Emergency Department (HOSPITAL_COMMUNITY): Payer: PPO

## 2017-04-26 DIAGNOSIS — Z79899 Other long term (current) drug therapy: Secondary | ICD-10-CM | POA: Insufficient documentation

## 2017-04-26 DIAGNOSIS — Y929 Unspecified place or not applicable: Secondary | ICD-10-CM | POA: Diagnosis not present

## 2017-04-26 DIAGNOSIS — W010XXA Fall on same level from slipping, tripping and stumbling without subsequent striking against object, initial encounter: Secondary | ICD-10-CM | POA: Insufficient documentation

## 2017-04-26 DIAGNOSIS — Z96652 Presence of left artificial knee joint: Secondary | ICD-10-CM | POA: Diagnosis not present

## 2017-04-26 DIAGNOSIS — Z85828 Personal history of other malignant neoplasm of skin: Secondary | ICD-10-CM | POA: Diagnosis not present

## 2017-04-26 DIAGNOSIS — S064X0A Epidural hemorrhage without loss of consciousness, initial encounter: Secondary | ICD-10-CM | POA: Diagnosis not present

## 2017-04-26 DIAGNOSIS — W19XXXA Unspecified fall, initial encounter: Secondary | ICD-10-CM

## 2017-04-26 DIAGNOSIS — I5022 Chronic systolic (congestive) heart failure: Secondary | ICD-10-CM | POA: Diagnosis not present

## 2017-04-26 DIAGNOSIS — S0990XA Unspecified injury of head, initial encounter: Secondary | ICD-10-CM

## 2017-04-26 DIAGNOSIS — I110 Hypertensive heart disease with heart failure: Secondary | ICD-10-CM | POA: Insufficient documentation

## 2017-04-26 DIAGNOSIS — S098XXA Other specified injuries of head, initial encounter: Secondary | ICD-10-CM | POA: Diagnosis not present

## 2017-04-26 DIAGNOSIS — E875 Hyperkalemia: Secondary | ICD-10-CM | POA: Diagnosis not present

## 2017-04-26 DIAGNOSIS — Y999 Unspecified external cause status: Secondary | ICD-10-CM | POA: Insufficient documentation

## 2017-04-26 DIAGNOSIS — Y9389 Activity, other specified: Secondary | ICD-10-CM | POA: Diagnosis not present

## 2017-04-26 DIAGNOSIS — Z85820 Personal history of malignant melanoma of skin: Secondary | ICD-10-CM | POA: Insufficient documentation

## 2017-04-26 DIAGNOSIS — S199XXA Unspecified injury of neck, initial encounter: Secondary | ICD-10-CM | POA: Diagnosis not present

## 2017-04-26 DIAGNOSIS — E039 Hypothyroidism, unspecified: Secondary | ICD-10-CM | POA: Insufficient documentation

## 2017-04-26 DIAGNOSIS — S0101XA Laceration without foreign body of scalp, initial encounter: Secondary | ICD-10-CM

## 2017-04-26 DIAGNOSIS — I4891 Unspecified atrial fibrillation: Secondary | ICD-10-CM | POA: Diagnosis not present

## 2017-04-26 LAB — BASIC METABOLIC PANEL
Anion gap: 8 (ref 5–15)
BUN: 42 mg/dL — AB (ref 6–20)
CO2: 22 mmol/L (ref 22–32)
Calcium: 8.8 mg/dL — ABNORMAL LOW (ref 8.9–10.3)
Chloride: 106 mmol/L (ref 101–111)
Creatinine, Ser: 1.34 mg/dL — ABNORMAL HIGH (ref 0.61–1.24)
GFR calc Af Amer: 54 mL/min — ABNORMAL LOW (ref >60)
GFR, EST NON AFRICAN AMERICAN: 47 mL/min — AB (ref >60)
Glucose, Bld: 101 mg/dL — ABNORMAL HIGH (ref 65–99)
POTASSIUM: 5.1 mmol/L (ref 3.5–5.1)
SODIUM: 136 mmol/L (ref 135–145)

## 2017-04-26 LAB — CBC WITH DIFFERENTIAL/PLATELET
BASOS ABS: 0 10*3/uL (ref 0.0–0.1)
BASOS PCT: 0 %
EOS ABS: 0.1 10*3/uL (ref 0.0–0.7)
Eosinophils Relative: 1 %
HCT: 41.8 % (ref 39.0–52.0)
HEMOGLOBIN: 14.1 g/dL (ref 13.0–17.0)
Lymphocytes Relative: 11 %
Lymphs Abs: 1.2 10*3/uL (ref 0.7–4.0)
MCH: 34.4 pg — ABNORMAL HIGH (ref 26.0–34.0)
MCHC: 33.7 g/dL (ref 30.0–36.0)
MCV: 102 fL — ABNORMAL HIGH (ref 78.0–100.0)
Monocytes Absolute: 0.9 10*3/uL (ref 0.1–1.0)
Monocytes Relative: 8 %
NEUTROS PCT: 80 %
Neutro Abs: 8.3 10*3/uL — ABNORMAL HIGH (ref 1.7–7.7)
Platelets: 142 10*3/uL — ABNORMAL LOW (ref 150–400)
RBC: 4.1 MIL/uL — AB (ref 4.22–5.81)
RDW: 13.7 % (ref 11.5–15.5)
WBC: 10.5 10*3/uL (ref 4.0–10.5)

## 2017-04-26 LAB — URINALYSIS, ROUTINE W REFLEX MICROSCOPIC
BACTERIA UA: NONE SEEN
Bilirubin Urine: NEGATIVE
Glucose, UA: NEGATIVE mg/dL
Hgb urine dipstick: NEGATIVE
KETONES UR: 5 mg/dL — AB
LEUKOCYTES UA: NEGATIVE
Nitrite: NEGATIVE
Protein, ur: 30 mg/dL — AB
SQUAMOUS EPITHELIAL / LPF: NONE SEEN
Specific Gravity, Urine: 1.018 (ref 1.005–1.030)
pH: 6 (ref 5.0–8.0)

## 2017-04-26 LAB — I-STAT CHEM 8, ED
BUN: 50 mg/dL — AB (ref 6–20)
CHLORIDE: 104 mmol/L (ref 101–111)
Calcium, Ion: 1.15 mmol/L (ref 1.15–1.40)
Creatinine, Ser: 1.5 mg/dL — ABNORMAL HIGH (ref 0.61–1.24)
Glucose, Bld: 100 mg/dL — ABNORMAL HIGH (ref 65–99)
HEMATOCRIT: 42 % (ref 39.0–52.0)
Hemoglobin: 14.3 g/dL (ref 13.0–17.0)
Potassium: 5.6 mmol/L — ABNORMAL HIGH (ref 3.5–5.1)
SODIUM: 138 mmol/L (ref 135–145)
TCO2: 28 mmol/L (ref 22–32)

## 2017-04-26 LAB — PROTIME-INR
INR: 2.87
Prothrombin Time: 29.8 seconds — ABNORMAL HIGH (ref 11.4–15.2)

## 2017-04-26 NOTE — ED Notes (Signed)
Pt stable,ambulatory, and verbalizes understanding of D/C instructions.   

## 2017-04-26 NOTE — ED Provider Notes (Signed)
[facility] EMERGENCY DEPARTMENT Provider Note   CSN: [id number] Arrival date & time: 04/26/17  1929     History   Chief Complaint Chief Complaint  Patient presents with  . Fall    HPI David Duncan is a 84 y.o. male.  HPI David Duncan is a 84 y.o. male with history of A. fib, bipolar disorder, CVA, CHF, anxiety, presents to emergency department after a fall.  Patient states he was in the bathroom when he lost his balance.  He denies feeling lightheaded or dizzy prior to fall, he denies tripping, he states he is not sure what made him fall but believes that he just "lost balance."  He states he fell striking back of his head on the floor.  There was no loss of consciousness.  He was unable to get up on his own.  He denies any other injuries during the fall.  Patient does take Coumadin for atrial fibrillation.  Patient denies any nausea or vomiting.  Denies any headache.  Denies new memory loss or confusion.  Patient's brother is at bedside.  Past Medical History:  Diagnosis Date  . Anxiety   . Arrhythmia   . Atrial fibrillation ([place])   . Basal cell cancer 2008   on shoulder  . Bipolar disorder [facility])    hospitalized in past  . CHF (congestive heart failure) ([place])   . CVA (cerebral vascular accident) ([place]) 8/11  . Generalized anxiety disorder   . GERD (gastroesophageal reflux disease)   . History of kidney stones    30 years ago  . Macular degeneration   . Melanoma ([place])    resected from scalp approximately 1 year ago.   . Osteoarthritis    in neck and left knee  . Tremor    noted when writing    Patient Active Problem List   Diagnosis Date Noted  . Depression 11/12/2016  . Hypotension 10/29/2016  . Suicidal ideation 09/02/2016  . Right shoulder injury 05/26/2016  . Pain in joint, shoulder region 04/02/2016  . BPH associated with nocturia 02/17/2016  . Mass in the abdomen 02/09/2016  . Disequilibrium 01/29/2016  . Unsteadiness  01/29/2016  . Family history of prostate cancer 01/07/2016  . Hypothyroidism 01/07/2016  . S/P total knee arthroplasty 12/03/2015  . Routine general medical examination at a health care facility 08/08/2015  . Congestive cardiomyopathy ([place]) 03/18/2015  . Spinal stenosis in cervical region 02/05/2015  . Cervical spinal stenosis 02/02/2015  . Left shoulder pain 12/06/2014  . Cardiomyopathy, dilated ([place]) 09/16/2014  . Cerebral vascular accident ([place]) 09/16/2014  . Heart valve disease 09/16/2014  . MI (mitral incompetence) 08/08/2014  . TI (tricuspid incompetence) 08/08/2014  . Atrial fibrillation, chronic ([place]) 01/16/2014  . Chronic systolic heart failure ([place]) 01/16/2014  . Bunion, right foot 08/17/2013  . Macular degeneration 10/16/2012  . Encounter for Medicare annual wellness exam 08/16/2012  . Hearing decreased 12/16/2010  . Medication adverse effect 07/28/2010  . Prostate cancer screening 07/28/2010  . Long term (current) use of anticoagulants 07/22/2010  . Hyperlipidemia 06/01/2010  . ATRIAL FLUTTER 11/28/2009  . Bipolar disorder ([place]) 02/20/2009    Past Surgical History:  Procedure Laterality Date  . BACK SURGERY  1991  . CATARACT EXTRACTION W/ INTRAOCULAR LENS IMPLANT Bilateral   . KNEE ARTHROPLASTY Left 12/03/2015   Procedure: COMPUTER ASSISTED TOTAL KNEE ARTHROPLASTY;  Surgeon: Dereck Leep, MD;  Location: [facility] ORS;  Service: Orthopedics;  Laterality: Left;  . SKIN CANCER EXCISION  Home Medications    Prior to Admission medications   Medication Sig Start Date End Date Taking? Authorizing Provider  Acetaminophen 500 MG coapsule Take 1 capsule by mouth every 4 (four) hours as needed for fever.    [provider]  Amino Acids (FREE FORM AMINO ACID COMPLEX PO) Take 1 capsule by mouth daily.    [provider]  Ascorbic Acid (VITAMIN C) 500 MG tablet Take 500 mg by mouth 2 (two) times daily.     [provider]  B Complex Vitamins  (B COMPLEX PO) Take by mouth 2 (two) times daily.     [provider]  Calcium-Magnesium-Vitamin D (CALCIUM 500 PO) Take 2 tablets by mouth daily.    [provider]  Cyanocobalamin (VITAMIN B-12) 2500 MCG SUBL Place 1 tablet under the tongue daily. Take 2 tabs by mouth daily     [provider]  diclofenac sodium (VOLTAREN) 1 % GEL Apply 2 g topically as needed.    [provider]  escitalopram (LEXAPRO) 20 MG tablet Take 1 tablet (20 mg total) by mouth every morning. 01/17/17   Rainey Pines, MD  furosemide (LASIX) 20 MG tablet Take 20 mg by mouth daily as needed.     [provider]  L-Methylfolate-Algae (DEPLIN 15) 15-90.314 MG CAPS Take 15 mg by mouth every morning. 08/02/16   Rainey Pines, MD  loperamide (IMODIUM) 2 MG capsule Take 2 mg by mouth as needed for diarrhea or loose stools.    [provider]  Lutein 6 MG CAPS Take 6 mg by mouth daily.     [provider]  metoprolol succinate (TOPROL-XL) 100 MG 24 hr tablet Take 1 tablet (100 mg total) by mouth daily. Take with or immediately following a meal. 01/04/17 04/04/17  Alisa Graff, FNP  mirtazapine (REMERON) 15 MG tablet Take 1 tablet (15 mg total) by mouth at bedtime. 01/17/17   Rainey Pines, MD  Multiple Vitamin (MULTIVITAMIN) capsule Take 1 capsule by mouth daily.    [provider]  NON FORMULARY Take 2 capsules by mouth daily.    [provider]  sacubitril-valsartan (ENTRESTO) 24-26 MG Take 1 tablet by mouth 2 (two) times daily. 08/26/16   Alisa Graff, FNP  Specialty Vitamins Products (ICAPS LUTEIN-ZEAXANTHIN PO) Take 2 capsules by mouth daily.    [provider]  Specialty Vitamins Products (PROSTATE PO) Take 1 tablet by mouth 2 (two) times daily.    [provider]  warfarin (COUMADIN) 2.5 MG tablet Take 2.5 mg by mouth. Take 1 tablet daily 4 days a week and the other 3 days, he takes 5 mg 09/10/14   [provider]     Family History Family History  Problem Relation Age of Onset  . Stroke Father   . Cancer Brother        prostate  . Coronary artery disease Brother   . Hypertension Brother     Social History Social History   Tobacco Use  . Smoking status: Never Smoker  . Smokeless tobacco: Never Used  Substance Use Topics  . Alcohol use: No    Alcohol/week: 0.0 oz  . Drug use: No     Allergies   Patient has no known allergies.   Review of Systems Review of Systems  Constitutional: Negative for chills and fever.  Respiratory: Negative for cough, chest tightness and shortness of breath.   Cardiovascular: Negative for chest pain, palpitations and leg swelling.  Gastrointestinal: Negative for  abdominal distention, abdominal pain, diarrhea, nausea and vomiting.  Genitourinary: Negative for dysuria, frequency, hematuria and urgency.  Musculoskeletal: Negative for arthralgias, myalgias, neck pain and neck stiffness.  Skin: Positive for wound. Negative for rash.  Allergic/Immunologic: Negative for immunocompromised state.  Neurological: Negative for dizziness, weakness, light-headedness, numbness and headaches.  All other systems reviewed and are negative.    Physical Exam Updated Vital Signs BP 134/83 (BP Location: Right Arm)   Pulse 99   Temp 98.1 F (36.7 C) (Oral)   Resp 18   SpO2 100%   Physical Exam  Constitutional: He is oriented to person, place, and time. He appears well-developed and well-nourished. No distress.  HENT:  Head: Normocephalic.  1 cm laceration to the posterior scalp, hemostatic  Eyes: Conjunctivae and EOM are normal. Pupils are equal, round, and reactive to light.  Neck: Neck supple.  No midline or paravertebral cervical spinal tenderness.  Full range of motion of the neck.  Cardiovascular: Normal rate, regular rhythm and normal heart sounds.  Pulmonary/Chest: Effort normal. No respiratory distress. He has no wheezes. He has no rales.  Abdominal:  Soft. Bowel sounds are normal. He exhibits no distension. There is no tenderness. There is no rebound.  Musculoskeletal: He exhibits no edema.  Full range of motion of bilateral upper and lower extremities, no bruising, swelling, deformity over extremities noted.  No midline thoracic or lumbar spinal tenderness.  Neurological: He is alert and oriented to person, place, and time.  5/5 and equal upper and lower extremity strength bilaterally. Equal grip strength bilaterally. Normal finger to nose and heel to shin. No pronator drift.   Skin: Skin is warm and dry.  Nursing note and vitals reviewed.    ED Treatments / Results  Labs (all labs ordered are listed, but only abnormal results are displayed) Labs Reviewed  URINALYSIS, ROUTINE W REFLEX MICROSCOPIC - Abnormal; Notable for the following components:      Result Value   Ketones, ur 5 (*)    Protein, ur 30 (*)    All other components within normal limits  CBC WITH DIFFERENTIAL/PLATELET - Abnormal; Notable for the following components:   RBC 4.10 (*)    MCV 102.0 (*)    MCH 34.4 (*)    Platelets 142 (*)    Neutro Abs 8.3 (*)    All other components within normal limits  PROTIME-INR - Abnormal; Notable for the following components:   Prothrombin Time 29.8 (*)    All other components within normal limits  BASIC METABOLIC PANEL - Abnormal; Notable for the following components:   Glucose, Bld 101 (*)    BUN 42 (*)    Creatinine, Ser 1.34 (*)    Calcium 8.8 (*)    GFR calc non Af Amer 47 (*)    GFR calc Af Amer 54 (*)    All other components within normal limits  I-STAT CHEM 8, ED - Abnormal; Notable for the following components:   Potassium 5.6 (*)    BUN 50 (*)    Creatinine, Ser 1.50 (*)    Glucose, Bld 100 (*)    All other components within normal limits    EKG  EKG Interpretation None       Radiology Ct Head Wo Contrast  Result Date: 04/26/2017 CLINICAL DATA:  Pt reports, he lost balance and fell in the bathroom.  Pt has laceration to posterior head. Pt on warfarin. EXAM: CT HEAD WITHOUT CONTRAST CT CERVICAL SPINE WITHOUT CONTRAST TECHNIQUE: Multidetector CT imaging  of the head and cervical spine was performed following the standard protocol without intravenous contrast. Multiplanar CT image reconstructions of the cervical spine were also generated. COMPARISON:  11/12/2009 FINDINGS: CT HEAD FINDINGS Brain: No evidence of acute infarction, hemorrhage, hydrocephalus, extra-axial collection or mass lesion/mass effect. There is ventricular and sulcal enlargement reflecting mild generalized atrophy. Patchy white matter hypoattenuation is noted consistent with mild chronic microvascular ischemic change. There is no old infarct extending from the left external capsule to the left centrum semiovale. Vascular: No hyperdense vessel or unexpected calcification. Skull: Normal. Negative for fracture or focal lesion. Sinuses/Orbits: Globes orbits are unremarkable and mastoid air cells. Visualized sinuses are clear. Other: None. CT CERVICAL SPINE FINDINGS Alignment: Mild reversal the normal cervical lordosis, apex at C4. Grade 1 anterolisthesis of C7 on T1, degenerative in origin. No other spondylolisthesis. Skull base and vertebrae: No acute fracture. No primary bone lesion or focal pathologic process. Soft tissues and spinal canal: No prevertebral fluid or swelling. No visible canal hematoma. Disc levels: Marked loss of disc height at C4-C5 and C5-C6. Moderate loss disc height at C6-C7 and C7-T1. Facet degenerative changes are noted most evident on the left at C2-C3 and C3-C4. Spondylotic disc bulging is noted most evident at C4-C5 and C5-C6. No convincing disc herniation. Upper chest: No acute findings.  No masses or adenopathy. Other: None. IMPRESSION: HEAD CT 1. No acute intracranial abnormalities. 2. Atrophy, old left-sided infarct and chronic microvascular ischemic change. CERVICAL CT 1. No fracture or acute finding. Electronically  Signed   By: Lajean Manes M.D.   On: 04/26/2017 20:39   Ct Cervical Spine Wo Contrast  Result Date: 04/26/2017 CLINICAL DATA:  Pt reports, he lost balance and fell in the bathroom. Pt has laceration to posterior head. Pt on warfarin. EXAM: CT HEAD WITHOUT CONTRAST CT CERVICAL SPINE WITHOUT CONTRAST TECHNIQUE: Multidetector CT imaging of the head and cervical spine was performed following the standard protocol without intravenous contrast. Multiplanar CT image reconstructions of the cervical spine were also generated. COMPARISON:  11/12/2009 FINDINGS: CT HEAD FINDINGS Brain: No evidence of acute infarction, hemorrhage, hydrocephalus, extra-axial collection or mass lesion/mass effect. There is ventricular and sulcal enlargement reflecting mild generalized atrophy. Patchy white matter hypoattenuation is noted consistent with mild chronic microvascular ischemic change. There is no old infarct extending from the left external capsule to the left centrum semiovale. Vascular: No hyperdense vessel or unexpected calcification. Skull: Normal. Negative for fracture or focal lesion. Sinuses/Orbits: Globes orbits are unremarkable and mastoid air cells. Visualized sinuses are clear. Other: None. CT CERVICAL SPINE FINDINGS Alignment: Mild reversal the normal cervical lordosis, apex at C4. Grade 1 anterolisthesis of C7 on T1, degenerative in origin. No other spondylolisthesis. Skull base and vertebrae: No acute fracture. No primary bone lesion or focal pathologic process. Soft tissues and spinal canal: No prevertebral fluid or swelling. No visible canal hematoma. Disc levels: Marked loss of disc height at C4-C5 and C5-C6. Moderate loss disc height at C6-C7 and C7-T1. Facet degenerative changes are noted most evident on the left at C2-C3 and C3-C4. Spondylotic disc bulging is noted most evident at C4-C5 and C5-C6. No convincing disc herniation. Upper chest: No acute findings.  No masses or adenopathy. Other: None. IMPRESSION:  HEAD CT 1. No acute intracranial abnormalities. 2. Atrophy, old left-sided infarct and chronic microvascular ischemic change. CERVICAL CT 1. No fracture or acute finding. Electronically Signed   By: Lajean Manes M.D.   On: 04/26/2017 20:39    Procedures Procedures (including critical  care time)  Medications Ordered in ED Medications - No data to display   Initial Impression / Assessment and Plan / ED Course  I have reviewed the triage vital signs and the nursing notes.  Pertinent labs & imaging results that were available during my care of the patient were reviewed by me and considered in my medical decision making (see chart for details).  Clinical Course as of Apr 27 2223  Tue Apr 26, 2017  2208 EKGAtrial fibrillation rhythm rate 98 Normal axis, normal ST T waves  [JK]    Clinical Course User Index [JK] Dorie Rank, MD    Patient after a fall, it is unclear what caused him to fall, he states he just lost balance.  Will check EKG, basic labs including INR since he is on Coumadin.  CT head and cervical spine ordered.  Patient has no other injuries.  I do not think laceration needs repair.  10:25 PM CT head and cervical spine unremarkable.  Labs show potassium of 5.1, slightly elevated creatinine and BUN at 1.5 and 42.  I do not have patient's baseline.  No EKG changes at this time.  INR is 2.87, theraputic.  I ambulated patient with his walker.  He is able to take a few steps which she states is normally all he can do anyways.  Patient's brother will be taking patient home.  At this time is stable for discharge, no complaints, will have him follow-up with family doctor.  Vitals:   04/26/17 2115 04/26/17 2145 04/26/17 2200 04/26/17 2215  BP: 122/71 111/76 122/73 126/72  Pulse: 91 85 100 (!) 110  Resp: (!) 21   20  Temp:      TempSrc:      SpO2: 97% 96% 96% 97%     Final Clinical Impressions(s) / ED Diagnoses   Final diagnoses:  Fall, initial encounter  Injury of head,  initial encounter  Laceration of scalp, initial encounter  Hyperkalemia    ED Discharge Orders    None      Jeannett Senior, Hershal Coria 04/26/17 2228  Dorie Rank, MD 04/27/17 [phone]

## 2017-04-26 NOTE — ED Triage Notes (Addendum)
Pt to ED via [facility] after reported falling and hitting the back of his head on floor.  Pt is currently on coumadin.  Pt had one episode of nausea and vomiting after fall.  Denies LOC.  Pt has a small lac to back of head.  Small amount of bleeding at this time

## 2017-04-26 NOTE — Discharge Instructions (Signed)
Take tylenol for any headache. Please follow up with family doctor to recheck your potassium which was a little high today. Return if worsening headache, vomiting, dizziness, fainting episode.

## 2017-04-27 ENCOUNTER — Telehealth: Payer: Self-pay | Admitting: *Deleted

## 2017-04-27 NOTE — Telephone Encounter (Signed)
Patient's wife called stating that she would like for Dr. Glori Bickers to review his ER records from last night and let her know if he needs to follow-up with her?

## 2017-04-27 NOTE — Telephone Encounter (Signed)
I will do that  How is he feeling ?

## 2017-04-28 ENCOUNTER — Ambulatory Visit: Payer: PPO | Admitting: Psychiatry

## 2017-04-29 NOTE — Telephone Encounter (Signed)
I reviewed his ED notes- please schedule a f/u appt when able to evaluate his dizziness- we need to try to find a cause or treatment if it continues.  Doubtful a scooter or motorized chair would be covered without more evaluation  Thanks

## 2017-04-29 NOTE — Telephone Encounter (Signed)
Pt notified of Dr. Marliss Coots comments and verbalized understanding. ER f/u scheduled for Monday

## 2017-04-29 NOTE — Telephone Encounter (Signed)
Spoke with pt and he said feeling ok, but when he moves it's makes him dizzy, pt is requesting a motorized scooter due to the dizziness, please advise  (FYI: if you do think pt needs a motorized scooter he would need a face-to-face appt)

## 2017-05-02 ENCOUNTER — Encounter: Payer: Self-pay | Admitting: Family Medicine

## 2017-05-02 ENCOUNTER — Ambulatory Visit (INDEPENDENT_AMBULATORY_CARE_PROVIDER_SITE_OTHER): Payer: PPO | Admitting: Family Medicine

## 2017-05-02 VITALS — BP 128/74 | HR 92 | Temp 97.8°F | Ht 66.0 in | Wt 157.8 lb

## 2017-05-02 DIAGNOSIS — I482 Chronic atrial fibrillation, unspecified: Secondary | ICD-10-CM

## 2017-05-02 DIAGNOSIS — S0990XA Unspecified injury of head, initial encounter: Secondary | ICD-10-CM | POA: Insufficient documentation

## 2017-05-02 DIAGNOSIS — I420 Dilated cardiomyopathy: Secondary | ICD-10-CM

## 2017-05-02 DIAGNOSIS — I639 Cerebral infarction, unspecified: Secondary | ICD-10-CM

## 2017-05-02 DIAGNOSIS — S0990XS Unspecified injury of head, sequela: Secondary | ICD-10-CM

## 2017-05-02 DIAGNOSIS — F319 Bipolar disorder, unspecified: Secondary | ICD-10-CM | POA: Diagnosis not present

## 2017-05-02 DIAGNOSIS — R2681 Unsteadiness on feet: Secondary | ICD-10-CM | POA: Diagnosis not present

## 2017-05-02 DIAGNOSIS — Z9181 History of falling: Secondary | ICD-10-CM | POA: Insufficient documentation

## 2017-05-02 DIAGNOSIS — E875 Hyperkalemia: Secondary | ICD-10-CM | POA: Insufficient documentation

## 2017-05-02 DIAGNOSIS — R7989 Other specified abnormal findings of blood chemistry: Secondary | ICD-10-CM

## 2017-05-02 NOTE — Patient Instructions (Addendum)
I think you may be a bit dehydrated   Please try to get 64 oz of fluids per day - mostly water  This is important for kidney health! Work on this and let's re check labs for potassium and kidney function in about a week   I will also refer you to PT for balance and gait training  Keep using your walker   If any concussion symptoms return (headache/ dizziness/nausea/confusion or trouble concentrating)-please let us know

## 2017-05-02 NOTE — Assessment & Plan Note (Signed)
Stable Continue cardiology f/u

## 2017-05-02 NOTE — Assessment & Plan Note (Signed)
In no doubt adding to balance issues/unsteadiness Some residual R sided weakness

## 2017-05-02 NOTE — Assessment & Plan Note (Signed)
From fall backwards that sent him to the ED Reviewed hospital records, lab results and studies in detail  After several days of concussive symptoms-now back to baseline Abrasion on post scalp healing  Reassuring neuro exam-at baseline Disc s/s of concussion/subdural to carefully watch for

## 2017-05-02 NOTE — Assessment & Plan Note (Signed)
Suspect relative dehydration  Reviewed hospital records, lab results and studies in detail  Will inc fluids to 64 oz daily  Re check renal profile 1 wk

## 2017-05-02 NOTE — Assessment & Plan Note (Signed)
With head injury  Reviewed hospital records, lab results and studies in detail  High fall risk- multifactorial including cva/ psych status/age/poly pharmacy and deconditioning  Now much improved  Disc fall risk in detail and game plan for fall prev Handouts given Will use walker at all times Ref to PT for gait training /balance  Consider neuro ref if needed

## 2017-05-02 NOTE — Progress Notes (Signed)
Subjective:    Patient ID: David Duncan, male    DOB: 04-May-1930, 84 y.o.   MRN: [id number]  HPI  Here for dizziness and f/u of ED visit on 1/15  He presented to the ED after a fall  He lost his balance while in the bathroom- fell and struck head on the floor  No LOC and able to get up himself  Sustained 1 cm laceration to posterior scalp  Exam was otherwise reassuring   Labs:  UA pos for ketones and protein   Lab Results  Component Value Date   WBC 10.5 04/26/2017   HGB 14.3 04/26/2017   HCT 42.0 04/26/2017   MCV 102.0 (H) 04/26/2017   PLT 142 (L) 04/26/2017   Lab Results  Component Value Date   CREATININE 1.50 (H) 04/26/2017   BUN 50 (H) 04/26/2017   NA 138 04/26/2017   K 5.6 (H) 04/26/2017   CL 104 04/26/2017   CO2 22 04/26/2017   Lab Results  Component Value Date   ALT 23 02/06/2016   AST 35 02/06/2016   ALKPHOS 132 (H) 02/06/2016   BILITOT 1.1 02/06/2016   Lab Results  Component Value Date   INR 2.87 04/26/2017   INR 2.03 12/16/2015   INR 1.47 12/11/2015   Glucose 101  EKG showed a fit with rate of 98 Nl axis and no acute changes   CT of the head and spine done   K was high at 5.6  Has cut out the supplement with potassium  Also stopped bananas     Ct Head Wo Contrast  Result Date: 04/26/2017 CLINICAL DATA:  Pt reports, he lost balance and fell in the bathroom. Pt has laceration to posterior head. Pt on warfarin. EXAM: CT HEAD WITHOUT CONTRAST CT CERVICAL SPINE WITHOUT CONTRAST TECHNIQUE: Multidetector CT imaging of the head and cervical spine was performed following the standard protocol without intravenous contrast. Multiplanar CT image reconstructions of the cervical spine were also generated. COMPARISON:  11/12/2009 FINDINGS: CT HEAD FINDINGS Brain: No evidence of acute infarction, hemorrhage, hydrocephalus, extra-axial collection or mass lesion/mass effect. There is ventricular and sulcal enlargement reflecting mild generalized atrophy.  Patchy white matter hypoattenuation is noted consistent with mild chronic microvascular ischemic change. There is no old infarct extending from the left external capsule to the left centrum semiovale. Vascular: No hyperdense vessel or unexpected calcification. Skull: Normal. Negative for fracture or focal lesion. Sinuses/Orbits: Globes orbits are unremarkable and mastoid air cells. Visualized sinuses are clear. Other: None. CT CERVICAL SPINE FINDINGS Alignment: Mild reversal the normal cervical lordosis, apex at C4. Grade 1 anterolisthesis of C7 on T1, degenerative in origin. No other spondylolisthesis. Skull base and vertebrae: No acute fracture. No primary bone lesion or focal pathologic process. Soft tissues and spinal canal: No prevertebral fluid or swelling. No visible canal hematoma. Disc levels: Marked loss of disc height at C4-C5 and C5-C6. Moderate loss disc height at C6-C7 and C7-T1. Facet degenerative changes are noted most evident on the left at C2-C3 and C3-C4. Spondylotic disc bulging is noted most evident at C4-C5 and C5-C6. No convincing disc herniation. Upper chest: No acute findings.  No masses or adenopathy. Other: None. IMPRESSION: HEAD CT 1. No acute intracranial abnormalities. 2. Atrophy, old left-sided infarct and chronic microvascular ischemic change. CERVICAL CT 1. No fracture or acute finding. Electronically Signed   By: Lajean Manes M.D.   On: 04/26/2017 20:39   Ct Cervical Spine Wo Contrast  Result Date: 04/26/2017 CLINICAL  DATA:  Pt reports, he lost balance and fell in the bathroom. Pt has laceration to posterior head. Pt on warfarin. EXAM: CT HEAD WITHOUT CONTRAST CT CERVICAL SPINE WITHOUT CONTRAST TECHNIQUE: Multidetector CT imaging of the head and cervical spine was performed following the standard protocol without intravenous contrast. Multiplanar CT image reconstructions of the cervical spine were also generated. COMPARISON:  11/12/2009 FINDINGS: CT HEAD FINDINGS Brain: No  evidence of acute infarction, hemorrhage, hydrocephalus, extra-axial collection or mass lesion/mass effect. There is ventricular and sulcal enlargement reflecting mild generalized atrophy. Patchy white matter hypoattenuation is noted consistent with mild chronic microvascular ischemic change. There is no old infarct extending from the left external capsule to the left centrum semiovale. Vascular: No hyperdense vessel or unexpected calcification. Skull: Normal. Negative for fracture or focal lesion. Sinuses/Orbits: Globes orbits are unremarkable and mastoid air cells. Visualized sinuses are clear. Other: None. CT CERVICAL SPINE FINDINGS Alignment: Mild reversal the normal cervical lordosis, apex at C4. Grade 1 anterolisthesis of C7 on T1, degenerative in origin. No other spondylolisthesis. Skull base and vertebrae: No acute fracture. No primary bone lesion or focal pathologic process. Soft tissues and spinal canal: No prevertebral fluid or swelling. No visible canal hematoma. Disc levels: Marked loss of disc height at C4-C5 and C5-C6. Moderate loss disc height at C6-C7 and C7-T1. Facet degenerative changes are noted most evident on the left at C2-C3 and C3-C4. Spondylotic disc bulging is noted most evident at C4-C5 and C5-C6. No convincing disc herniation. Upper chest: No acute findings.  No masses or adenopathy. Other: None. IMPRESSION: HEAD CT 1. No acute intracranial abnormalities. 2. Atrophy, old left-sided infarct and chronic microvascular ischemic change. CERVICAL CT 1. No fracture or acute finding. Electronically Signed   By: Lajean Manes M.D.   On: 04/26/2017 20:39    BP Readings from Last 3 Encounters:  05/02/17 128/74  04/26/17 126/72  01/04/17 128/90   Pulse Readings from Last 3 Encounters:  05/02/17 92  04/26/17 (!) 110  01/04/17 92    Wt Readings from Last 3 Encounters:  05/02/17 157 lb 12 oz (71.6 kg)  01/04/17 153 lb 8 oz (69.6 kg)  11/12/16 155 lb 2 oz (70.4 kg)    He has hx of  disequilibrium and unsteadiness in the past Takes warfarin anticoag for a fib    He has been dizzy since the fall  Thinks he has a slight concussion - but has improved day to day  Quite a bit better today  Did not have much of a headache   Struggling with back problems   He is using the walker full time now (he had gotten away from it for a bit)  They have worked to make house safer for falls  Has laminate floor and carpet  They are considering a new shower   Patient Active Problem List   Diagnosis Date Noted  . H/O falling 05/02/2017  . Elevated serum creatinine 05/02/2017  . Hyperkalemia 05/02/2017  . Closed head injury 05/02/2017  . Depression 11/12/2016  . Right shoulder injury 05/26/2016  . Pain in joint, shoulder region 04/02/2016  . BPH associated with nocturia 02/17/2016  . Mass in the abdomen 02/09/2016  . Disequilibrium 01/29/2016  . Unsteadiness 01/29/2016  . Family history of prostate cancer 01/07/2016  . Hypothyroidism 01/07/2016  . S/P total knee arthroplasty 12/03/2015  . Routine general medical examination at a health care facility 08/08/2015  . Spinal stenosis in cervical region 02/05/2015  . Cervical spinal stenosis 02/02/2015  .  Left shoulder pain 12/06/2014  . Cardiomyopathy, dilated ([place]) 09/16/2014  . Cerebral vascular accident ([place]) 09/16/2014  . Heart valve disease 09/16/2014  . MI (mitral incompetence) 08/08/2014  . TI (tricuspid incompetence) 08/08/2014  . Atrial fibrillation, chronic ([place]) 01/16/2014  . Chronic systolic heart failure ([place]) 01/16/2014  . Bunion, right foot 08/17/2013  . Macular degeneration 10/16/2012  . Encounter for Medicare annual wellness exam 08/16/2012  . Hearing decreased 12/16/2010  . Medication adverse effect 07/28/2010  . Prostate cancer screening 07/28/2010  . Long term (current) use of anticoagulants 07/22/2010  . Hyperlipidemia 06/01/2010  . ATRIAL FLUTTER 11/28/2009  . Bipolar disorder ([place]) 02/20/2009    Past Medical History:  Diagnosis Date  . Anxiety   . Arrhythmia   . Atrial fibrillation ([place])   . Basal cell cancer 2008   on shoulder  . Bipolar disorder [facility])    hospitalized in past  . CHF (congestive heart failure) ([place])   . CVA (cerebral vascular accident) ([place]) 8/11  . Generalized anxiety disorder   . GERD (gastroesophageal reflux disease)   . History of kidney stones    30 years ago  . Macular degeneration   . Melanoma ([place])    resected from scalp approximately 1 year ago.   . Osteoarthritis    in neck and left knee  . Tremor    noted when writing   Past Surgical History:  Procedure Laterality Date  . BACK SURGERY  1991  . CATARACT EXTRACTION W/ INTRAOCULAR LENS IMPLANT Bilateral   . KNEE ARTHROPLASTY Left 12/03/2015   Procedure: COMPUTER ASSISTED TOTAL KNEE ARTHROPLASTY;  Surgeon: Dereck Leep, MD;  Location: [facility] ORS;  Service: Orthopedics;  Laterality: Left;  . SKIN CANCER EXCISION     Social History   Tobacco Use  . Smoking status: Never Smoker  . Smokeless tobacco: Never Used  Substance Use Topics  . Alcohol use: No    Alcohol/week: 0.0 oz  . Drug use: No   Family History  Problem Relation Age of Onset  . Stroke Father   . Cancer Brother        prostate  . Coronary artery disease Brother   . Hypertension Brother    No Known Allergies Current Outpatient Medications on File Prior to Visit  Medication Sig Dispense Refill  . Acetaminophen 500 MG coapsule Take 1 capsule by mouth every 4 (four) hours as needed for fever.    . Amino Acids (FREE FORM AMINO ACID COMPLEX PO) Take 1 capsule by mouth daily.    . Ascorbic Acid (VITAMIN C) 500 MG tablet Take 500 mg by mouth 2 (two) times daily.     . B Complex Vitamins (B COMPLEX PO) Take by mouth 2 (two) times daily.     . Calcium-Magnesium-Vitamin D (CALCIUM 500 PO) Take 2 tablets by mouth daily.    . Cyanocobalamin (VITAMIN B-12) 2500 MCG SUBL Place 1 tablet under the tongue daily. Take 2 tabs by mouth  daily     . diclofenac sodium (VOLTAREN) 1 % GEL Apply 2 g topically as needed.    [name] Kitchen escitalopram (LEXAPRO) 20 MG tablet Take 1 tablet (20 mg total) by mouth every morning. (Patient taking differently: Take 10 mg by mouth every morning. ) 90 tablet 1  . furosemide (LASIX) 20 MG tablet Take 20 mg by mouth daily as needed.     [name] Kitchen L-Methylfolate-Algae (DEPLIN 15) 15-90.314 MG CAPS Take 15 mg by mouth every morning. 28 capsule 0  . loperamide (IMODIUM)  2 MG capsule Take 2 mg by mouth as needed for diarrhea or loose stools.    . Lutein 6 MG CAPS Take 6 mg by mouth daily.     . metoprolol succinate (TOPROL-XL) 100 MG 24 hr tablet Take 1 tablet (100 mg total) by mouth daily. Take with or immediately following a meal. 90 tablet 3  . Multiple Vitamin (MULTIVITAMIN) capsule Take 1 capsule by mouth daily.    . NON FORMULARY Take 2 capsules by mouth daily.    . sacubitril-valsartan (ENTRESTO) 24-26 MG Take 1 tablet by mouth 2 (two) times daily. 60 tablet 3  . Specialty Vitamins Products (ICAPS LUTEIN-ZEAXANTHIN PO) Take 2 capsules by mouth daily.    [name] Kitchen Specialty Vitamins Products (PROSTATE PO) Take 1 tablet by mouth daily.     [name] Kitchen warfarin (COUMADIN) 2.5 MG tablet Take 2.5 mg by mouth See admin instructions. 2.5mg  on Sun/Tues/Wed/Thurs/Sat 5mg  on Mon/Fri     No current facility-administered medications on file prior to visit.     Review of Systems  Constitutional: Positive for fatigue. Negative for activity change, appetite change, fever and unexpected weight change.  HENT: Negative for congestion, rhinorrhea, sore throat and trouble swallowing.   Eyes: Negative for pain, redness, itching and visual disturbance.  Respiratory: Negative for cough, chest tightness, shortness of breath and wheezing.   Cardiovascular: Negative for chest pain and palpitations.  Gastrointestinal: Negative for abdominal pain, blood in stool, constipation, diarrhea and nausea.  Endocrine: Negative for cold intolerance, heat  intolerance, polydipsia and polyuria.  Genitourinary: Negative for difficulty urinating, dysuria, frequency and urgency.  Musculoskeletal: Positive for arthralgias and back pain. Negative for joint swelling and myalgias.  Skin: Negative for pallor and rash.  Neurological: Positive for weakness. Negative for dizziness, tremors, seizures, syncope, facial asymmetry, speech difficulty, light-headedness, numbness and headaches.  Hematological: Negative for adenopathy. Does not bruise/bleed easily.  Psychiatric/Behavioral: Negative for behavioral problems, confusion, decreased concentration, dysphoric mood, self-injury and suicidal ideas. The patient is nervous/anxious.        Pos for lack of motivation       Objective:   Physical Exam  Constitutional: He is oriented to person, place, and time. He appears well-developed and well-nourished. No distress.  Frail appearing elderly male  HENT:  Head: Normocephalic.  Right Ear: External ear normal.  Left Ear: External ear normal.  Nose: Nose normal.  Mouth/Throat: Oropharynx is clear and moist. No oropharyngeal exudate.  Healing abrasion and contusion on post scalp/nontender  No temporal tenderness  No TMJ tenderness  Eyes: Conjunctivae and EOM are normal. Pupils are equal, round, and reactive to light. Right eye exhibits no discharge. Left eye exhibits no discharge. No scleral icterus.  No nystagmus  Neck: Normal range of motion and full passive range of motion without pain. Neck supple. No JVD present. Carotid bruit is not present. No tracheal deviation present. No thyromegaly present.  Cardiovascular: Normal rate and normal heart sounds.  No murmur heard. irreg irreg rhythm baseline  Pulmonary/Chest: Effort normal and breath sounds normal. No respiratory distress. He has no wheezes. He has no rales.  Abdominal: Soft. Bowel sounds are normal. He exhibits no distension and no mass. There is no tenderness.  Musculoskeletal: He exhibits no edema  or tenderness.  Lymphadenopathy:    He has no cervical adenopathy.  Neurological: He is alert and oriented to person, place, and time. He has normal strength and normal reflexes. He displays no atrophy and no tremor. No cranial nerve deficit or sensory deficit. He  exhibits normal muscle tone. He displays a negative Romberg sign. Gait abnormal. Coordination normal.  No focal cerebellar signs   R side is mildly weaker as is baseline  R pronator drift   Gait is wide based and shuffling with walker  Neg romberg today   Skin: Skin is warm and dry. No rash noted. No pallor.  Psychiatric: Thought content normal. His affect is blunt. His speech is delayed. He is withdrawn. Thought content is not paranoid. He expresses no homicidal and no suicidal ideation.  Blunted affect in setting of bipolar           Assessment & Plan:   Problem List Items Addressed This Visit      Cardiovascular and Mediastinum   Atrial fibrillation, chronic (HCC)    Stable Continues warfarin anticoagulation      Cardiomyopathy, dilated ([place])    Stable Continue cardiology f/u      Cerebral vascular accident ([place])    In no doubt adding to balance issues/unsteadiness Some residual R sided weakness        Other   Bipolar disorder ([place])    Stable clinically Continues psychiatric f/u       Closed head injury    From fall backwards that sent him to the ED Reviewed hospital records, lab results and studies in detail  After several days of concussive symptoms-now back to baseline Abrasion on post scalp healing  Reassuring neuro exam-at baseline Disc s/s of concussion/subdural to carefully watch for        Elevated serum creatinine    Suspect relative dehydration  Reviewed hospital records, lab results and studies in detail  Will inc fluids to 64 oz daily  Re check renal profile 1 wk      H/O falling - Primary    With head injury  Reviewed hospital records, lab results and studies in detail  High  fall risk- multifactorial including cva/ psych status/age/poly pharmacy and deconditioning  Now much improved  Disc fall risk in detail and game plan for fall prev Handouts given Will use walker at all times Ref to PT for gait training /balance  Consider neuro ref if needed       Hyperkalemia    Reviewed hospital records, lab results and studies in detail  Pt has stopped otc K supplements  Will inc fluids and re check lab 1 week Also noted renal insuff Also taking entresto      Unsteadiness    S/p fall /ED visit  Reviewed hospital records, lab results and studies in detail   Fall prev disc  Suspect multifactorial  Ref to PT to start  Will use walker at all times        Other Visit Diagnoses    Chronic atrial fibrillation (HCC)   (Chronic)

## 2017-05-02 NOTE — Assessment & Plan Note (Signed)
Stable Continues warfarin anticoagulation

## 2017-05-02 NOTE — Assessment & Plan Note (Signed)
S/p fall /ED visit  Reviewed hospital records, lab results and studies in detail   Fall prev disc  Suspect multifactorial  Ref to PT to start  Will use walker at all times

## 2017-05-02 NOTE — Assessment & Plan Note (Signed)
Reviewed hospital records, lab results and studies in detail  Pt has stopped otc K supplements  Will inc fluids and re check lab 1 week Also noted renal insuff Also taking entresto

## 2017-05-02 NOTE — Assessment & Plan Note (Signed)
Stable clinically Continues psychiatric f/u

## 2017-05-05 NOTE — Progress Notes (Deleted)
Patient ID: David Duncan, male    DOB: 10/29/1930, 84 y.o.   MRN: [id number]  HPI  David Duncan is a 84 y/o male with a history of atrial fibrillation, basal cell cancer, bipolar, CVA, anxiety, GERD, macular degeneration, melanoma, osteoarthritis and chronic heart failure.   Last echo was done 07/15/16 and showed an EF of 20% along with mild AR and severe David/TR.  Was in the ED 04/26/17 due to a fall. Evaluated and released.   Patient and wife present today for follow-up visit with a chief complaint of   Past Medical History:  Diagnosis Date  . Anxiety   . Arrhythmia   . Atrial fibrillation ([place])   . Basal cell cancer 2008   on shoulder  . Bipolar disorder [facility])    hospitalized in past  . CHF (congestive heart failure) ([place])   . CVA (cerebral vascular accident) ([place]) 8/11  . Generalized anxiety disorder   . GERD (gastroesophageal reflux disease)   . History of kidney stones    30 years ago  . Macular degeneration   . Melanoma ([place])    resected from scalp approximately 1 year ago.   . Osteoarthritis    in neck and left knee  . Tremor    noted when writing   Past Surgical History:  Procedure Laterality Date  . BACK SURGERY  1991  . CATARACT EXTRACTION W/ INTRAOCULAR LENS IMPLANT Bilateral   . KNEE ARTHROPLASTY Left 12/03/2015   Procedure: COMPUTER ASSISTED TOTAL KNEE ARTHROPLASTY;  Surgeon: Dereck Leep, MD;  Location: [facility] ORS;  Service: Orthopedics;  Laterality: Left;  . SKIN CANCER EXCISION     Family History  Problem Relation Age of Onset  . Stroke Father   . Cancer Brother        prostate  . Coronary artery disease Brother   . Hypertension Brother    Social History   Tobacco Use  . Smoking status: Never Smoker  . Smokeless tobacco: Never Used  Substance Use Topics  . Alcohol use: No    Alcohol/week: 0.0 oz   No Known Allergies     Review of Systems  Constitutional: Positive for fatigue. Negative for appetite change.  HENT: Negative for congestion,  rhinorrhea and sore throat.   Eyes: Negative.   Respiratory: Negative for cough, chest tightness and shortness of breath.   Cardiovascular: Negative for chest pain, palpitations and leg swelling.  Gastrointestinal: Negative for abdominal distention.  Endocrine: Negative.   Genitourinary: Negative.   Musculoskeletal: Positive for arthralgias (right shoulder). Negative for back pain.  Skin: Negative.   Allergic/Immunologic: Negative.   Neurological: Positive for light-headedness. Negative for dizziness.  Hematological: Negative for adenopathy. Does not bruise/bleed easily.  Psychiatric/Behavioral: Positive for sleep disturbance (now sleeping in the bed but still has difficullty sleeping well). Negative for dysphoric mood. The patient is not nervous/anxious.     Physical Exam  Constitutional: He is oriented to person, place, and time. He appears well-developed and well-nourished.  HENT:  Head: Normocephalic and atraumatic.  Neck: Normal range of motion. Neck supple. No JVD present.  Cardiovascular: An irregular rhythm present. Tachycardia present.  Pulmonary/Chest: Effort normal. He has no wheezes. He has no rales.  Abdominal: Soft. He exhibits no distension. There is no tenderness.  Musculoskeletal: He exhibits no edema or tenderness.  Neurological: He is alert and oriented to person, place, and time.  Skin: Skin is warm and dry.  Psychiatric: He has a normal mood and affect. His behavior is  normal. Thought content normal.  Nursing note and vitals reviewed.  Assessment & Plan:  1: Chronic heart failure with reduced ejection fraction- - NYHA class II - euvolemic  - Is not weighing daily; reminded to call for an overnight weight gain of >2 pounds or a weekly weight gain of >5 pounds.  - Weight stable since last visit  - doesn't use salt shaker. Discussed the importance of closely following a 2000mg  sodium diet.  - He says he thinks he is drinking enough fluids daily; Discussed  keeping his fluid intake to between 40-50 ounces of fluid daily -  - saw cardiologist Nehemiah Massed) 02/08/17 - BMP done 04/26/17 showed sodium 138, potassium 5.6 and GFR 47    2: Atrial fibrillation- - currently rate controlled on metoprolol and warfarin - INR on 04/14/17 was 4.0  3: Hypotension- - BP looks good; has not needed furosemide for edema  - advised patient to take furosemide when increased edema or shortness of breath - saw PCP Deborra Medina) 10/25/16  4: Depression- - Patient asked about possibly decreasing Lexapro dose and starting something new for his depression; patient to follow up with psychiatry in a couple weeks - saw psychiatry Gretel Acre) 01/17/17  Patient did not bring his medications nor a list. Each medication was verbally reviewed with the patient and he was encouraged to bring the bottles to every visit to confirm accuracy of list.

## 2017-05-06 ENCOUNTER — Ambulatory Visit: Payer: PPO | Admitting: Family

## 2017-05-12 DIAGNOSIS — R791 Abnormal coagulation profile: Secondary | ICD-10-CM | POA: Diagnosis not present

## 2017-05-26 DIAGNOSIS — I5022 Chronic systolic (congestive) heart failure: Secondary | ICD-10-CM | POA: Diagnosis not present

## 2017-05-26 DIAGNOSIS — R791 Abnormal coagulation profile: Secondary | ICD-10-CM | POA: Diagnosis not present

## 2017-05-30 DIAGNOSIS — M9933 Osseous stenosis of neural canal of lumbar region: Secondary | ICD-10-CM | POA: Diagnosis not present

## 2017-05-31 ENCOUNTER — Other Ambulatory Visit: Payer: Self-pay | Admitting: Orthopedic Surgery

## 2017-05-31 DIAGNOSIS — M9933 Osseous stenosis of neural canal of lumbar region: Secondary | ICD-10-CM

## 2017-06-09 ENCOUNTER — Ambulatory Visit
Admission: RE | Admit: 2017-06-09 | Discharge: 2017-06-09 | Disposition: A | Discharge status: Home / Self Care | Payer: PPO | Source: Ambulatory Visit | Attending: Orthopedic Surgery | Admitting: Orthopedic Surgery

## 2017-06-09 DIAGNOSIS — M545 Low back pain: Secondary | ICD-10-CM | POA: Diagnosis not present

## 2017-06-09 DIAGNOSIS — M48061 Spinal stenosis, lumbar region without neurogenic claudication: Secondary | ICD-10-CM | POA: Insufficient documentation

## 2017-06-09 DIAGNOSIS — M9933 Osseous stenosis of neural canal of lumbar region: Secondary | ICD-10-CM | POA: Insufficient documentation

## 2017-06-09 DIAGNOSIS — M5126 Other intervertebral disc displacement, lumbar region: Secondary | ICD-10-CM | POA: Insufficient documentation

## 2017-06-09 DIAGNOSIS — M1288 Other specific arthropathies, not elsewhere classified, other specified site: Secondary | ICD-10-CM | POA: Diagnosis not present

## 2017-06-14 DIAGNOSIS — Z85828 Personal history of other malignant neoplasm of skin: Secondary | ICD-10-CM | POA: Diagnosis not present

## 2017-06-14 DIAGNOSIS — D2271 Melanocytic nevi of right lower limb, including hip: Secondary | ICD-10-CM | POA: Diagnosis not present

## 2017-06-14 DIAGNOSIS — L570 Actinic keratosis: Secondary | ICD-10-CM | POA: Diagnosis not present

## 2017-06-14 DIAGNOSIS — X32XXXA Exposure to sunlight, initial encounter: Secondary | ICD-10-CM | POA: Diagnosis not present

## 2017-06-14 DIAGNOSIS — D225 Melanocytic nevi of trunk: Secondary | ICD-10-CM | POA: Diagnosis not present

## 2017-06-14 DIAGNOSIS — D2261 Melanocytic nevi of right upper limb, including shoulder: Secondary | ICD-10-CM | POA: Diagnosis not present

## 2017-06-16 DIAGNOSIS — M48062 Spinal stenosis, lumbar region with neurogenic claudication: Secondary | ICD-10-CM | POA: Diagnosis not present

## 2017-06-23 DIAGNOSIS — I480 Paroxysmal atrial fibrillation: Secondary | ICD-10-CM | POA: Diagnosis not present

## 2017-06-30 DIAGNOSIS — M545 Low back pain: Secondary | ICD-10-CM | POA: Diagnosis not present

## 2017-07-04 DIAGNOSIS — M545 Low back pain: Secondary | ICD-10-CM | POA: Diagnosis not present

## 2017-07-08 DIAGNOSIS — M545 Low back pain: Secondary | ICD-10-CM | POA: Diagnosis not present

## 2017-07-12 DIAGNOSIS — M545 Low back pain: Secondary | ICD-10-CM | POA: Diagnosis not present

## 2017-07-14 DIAGNOSIS — M545 Low back pain: Secondary | ICD-10-CM | POA: Diagnosis not present

## 2017-07-18 DIAGNOSIS — M545 Low back pain: Secondary | ICD-10-CM | POA: Diagnosis not present

## 2017-07-21 DIAGNOSIS — M545 Low back pain: Secondary | ICD-10-CM | POA: Diagnosis not present

## 2017-07-25 DIAGNOSIS — Z7901 Long term (current) use of anticoagulants: Secondary | ICD-10-CM | POA: Diagnosis not present

## 2017-07-25 DIAGNOSIS — M5136 Other intervertebral disc degeneration, lumbar region: Secondary | ICD-10-CM | POA: Diagnosis not present

## 2017-07-25 DIAGNOSIS — M48062 Spinal stenosis, lumbar region with neurogenic claudication: Secondary | ICD-10-CM | POA: Diagnosis not present

## 2017-07-25 DIAGNOSIS — M5416 Radiculopathy, lumbar region: Secondary | ICD-10-CM | POA: Diagnosis not present

## 2017-08-01 DIAGNOSIS — I10 Essential (primary) hypertension: Secondary | ICD-10-CM | POA: Diagnosis not present

## 2017-08-01 DIAGNOSIS — R600 Localized edema: Secondary | ICD-10-CM | POA: Diagnosis not present

## 2017-08-01 DIAGNOSIS — I420 Dilated cardiomyopathy: Secondary | ICD-10-CM | POA: Diagnosis not present

## 2017-08-01 DIAGNOSIS — I482 Chronic atrial fibrillation: Secondary | ICD-10-CM | POA: Diagnosis not present

## 2017-08-04 DIAGNOSIS — I420 Dilated cardiomyopathy: Secondary | ICD-10-CM | POA: Diagnosis not present

## 2017-08-08 DIAGNOSIS — I5022 Chronic systolic (congestive) heart failure: Secondary | ICD-10-CM | POA: Diagnosis not present

## 2017-08-08 DIAGNOSIS — I10 Essential (primary) hypertension: Secondary | ICD-10-CM | POA: Diagnosis not present

## 2017-08-08 DIAGNOSIS — I482 Chronic atrial fibrillation: Secondary | ICD-10-CM | POA: Diagnosis not present

## 2017-08-22 DIAGNOSIS — Z7901 Long term (current) use of anticoagulants: Secondary | ICD-10-CM | POA: Diagnosis not present

## 2017-08-23 DIAGNOSIS — M48062 Spinal stenosis, lumbar region with neurogenic claudication: Secondary | ICD-10-CM | POA: Diagnosis not present

## 2017-08-23 DIAGNOSIS — M5136 Other intervertebral disc degeneration, lumbar region: Secondary | ICD-10-CM | POA: Diagnosis not present

## 2017-08-23 DIAGNOSIS — Z7901 Long term (current) use of anticoagulants: Secondary | ICD-10-CM | POA: Diagnosis not present

## 2017-08-23 DIAGNOSIS — M5416 Radiculopathy, lumbar region: Secondary | ICD-10-CM | POA: Diagnosis not present

## 2017-09-08 DIAGNOSIS — H353133 Nonexudative age-related macular degeneration, bilateral, advanced atrophic without subfoveal involvement: Secondary | ICD-10-CM | POA: Diagnosis not present

## 2017-09-12 DIAGNOSIS — L570 Actinic keratosis: Secondary | ICD-10-CM | POA: Diagnosis not present

## 2017-09-12 DIAGNOSIS — L309 Dermatitis, unspecified: Secondary | ICD-10-CM | POA: Diagnosis not present

## 2017-09-12 DIAGNOSIS — X32XXXA Exposure to sunlight, initial encounter: Secondary | ICD-10-CM | POA: Diagnosis not present

## 2017-09-22 DIAGNOSIS — M48062 Spinal stenosis, lumbar region with neurogenic claudication: Secondary | ICD-10-CM | POA: Diagnosis not present

## 2017-09-26 ENCOUNTER — Encounter: Payer: Self-pay | Admitting: Family Medicine

## 2017-09-26 ENCOUNTER — Ambulatory Visit (INDEPENDENT_AMBULATORY_CARE_PROVIDER_SITE_OTHER): Payer: PPO | Admitting: Family Medicine

## 2017-09-26 VITALS — BP 126/74 | HR 103 | Temp 98.2°F | Ht 66.0 in | Wt 165.0 lb

## 2017-09-26 DIAGNOSIS — Z01818 Encounter for other preprocedural examination: Secondary | ICD-10-CM | POA: Diagnosis not present

## 2017-09-26 DIAGNOSIS — I420 Dilated cardiomyopathy: Secondary | ICD-10-CM

## 2017-09-26 DIAGNOSIS — E875 Hyperkalemia: Secondary | ICD-10-CM | POA: Diagnosis not present

## 2017-09-26 DIAGNOSIS — E039 Hypothyroidism, unspecified: Secondary | ICD-10-CM | POA: Diagnosis not present

## 2017-09-26 DIAGNOSIS — R7989 Other specified abnormal findings of blood chemistry: Secondary | ICD-10-CM | POA: Diagnosis not present

## 2017-09-26 DIAGNOSIS — I482 Chronic atrial fibrillation, unspecified: Secondary | ICD-10-CM

## 2017-09-26 DIAGNOSIS — F319 Bipolar disorder, unspecified: Secondary | ICD-10-CM

## 2017-09-26 LAB — RENAL FUNCTION PANEL
Albumin: 3.9 g/dL (ref 3.5–5.2)
BUN: 23 mg/dL (ref 6–23)
CHLORIDE: 102 meq/L (ref 96–112)
CO2: 27 mEq/L (ref 19–32)
Calcium: 9.5 mg/dL (ref 8.4–10.5)
Creatinine, Ser: 1.34 mg/dL (ref 0.40–1.50)
GFR: 53.84 mL/min — ABNORMAL LOW (ref >60.00)
Glucose, Bld: 108 mg/dL — ABNORMAL HIGH (ref 70–99)
PHOSPHORUS: 2.6 mg/dL (ref 2.3–4.6)
POTASSIUM: 4.6 meq/L (ref 3.5–5.1)
SODIUM: 136 meq/L (ref 135–145)

## 2017-09-26 LAB — CBC WITH DIFFERENTIAL/PLATELET
Basophils Absolute: 0 10*3/uL (ref 0.0–0.1)
Basophils Relative: 0.6 % (ref 0.0–3.0)
EOS ABS: 0.2 10*3/uL (ref 0.0–0.7)
Eosinophils Relative: 2.9 % (ref 0.0–5.0)
HCT: 42.5 % (ref 39.0–52.0)
HEMOGLOBIN: 14.6 g/dL (ref 13.0–17.0)
Lymphocytes Relative: 24.6 % (ref 12.0–46.0)
Lymphs Abs: 1.5 10*3/uL (ref 0.7–4.0)
MCHC: 34.3 g/dL (ref 30.0–36.0)
MCV: 99 fl (ref 78.0–100.0)
MONO ABS: 0.6 10*3/uL (ref 0.1–1.0)
Monocytes Relative: 10.5 % (ref 3.0–12.0)
Neutro Abs: 3.7 10*3/uL (ref 1.4–7.7)
Neutrophils Relative %: 61.4 % (ref 43.0–77.0)
Platelets: 138 10*3/uL — ABNORMAL LOW (ref 150.0–400.0)
RBC: 4.29 Mil/uL (ref 4.22–5.81)
RDW: 14.2 % (ref 11.5–15.5)
WBC: 6.1 10*3/uL (ref 4.0–10.5)

## 2017-09-26 LAB — TSH: TSH: 1.05 u[IU]/mL (ref 0.35–4.50)

## 2017-09-26 NOTE — Assessment & Plan Note (Signed)
Lost to fu for this  Last 1.5 (was dehydrated however)  Will re check this today in prep for back surgery

## 2017-09-26 NOTE — Assessment & Plan Note (Signed)
Off nature thyroid and lost to f/u  TSH today  Has been more depressed

## 2017-09-26 NOTE — Assessment & Plan Note (Signed)
More depressed lately  Unsure when his psychiatric f/u is  Enc him to do that  He and his wife do not think tx has helped much

## 2017-09-26 NOTE — Assessment & Plan Note (Signed)
Was up when dehydrated Re check today

## 2017-09-26 NOTE — Progress Notes (Signed)
Subjective:    Patient ID: David Duncan, male    DOB: [birth year], 84 y.o.   MRN: [id number]  HPI Here for surgical clearance for lumbar laminectomy  (L2-5 decompression) Dr Rainey Pines Planned for 6/26  Pain is just when he walks and his legs give out    Wt Readings from Last 3 Encounters:  09/26/17 165 lb (74.8 kg)  05/02/17 157 lb 12 oz (71.6 kg)  01/04/17 153 lb 8 oz (69.6 kg)   26.63 kg/m   BP Readings from Last 3 Encounters:  09/26/17 126/74  05/02/17 128/74  04/26/17 126/72   Pulse Readings from Last 3 Encounters:  09/26/17 (!) 103  05/02/17 92  04/26/17 (!) 110   Lab Results  Component Value Date   CREATININE 1.50 (H) 04/26/2017   BUN 50 (H) 04/26/2017   NA 138 04/26/2017   K 5.6 (H) 04/26/2017   CL 104 04/26/2017   CO2 22 04/26/2017   Lab Results  Component Value Date   WBC 10.5 04/26/2017   HGB 14.3 04/26/2017   HCT 42.0 04/26/2017   MCV 102.0 (H) 04/26/2017   PLT 142 (L) 04/26/2017    Overdue for re check of labs  Had disc need for better fluid intake  He has made the effort to drink fluids    Takes warfarin for a fib (also hx of cva)-goes in and out  Also has dilated cardiomyopathy  Mitral and tricuspid insuff Per pt cardiology has already cleared him - they will handle the anticoag issues /bridge  Doing well from a heart perspective -no flares of CHF   Hypothyroidism  Pt has no clinical changes No change in energy level/ hair or skin/ edema and no tremor  No known drug allergies   Anesthesia -no trouble  No n/v  No problems with local or general anesthesia   Has been feeling ok lately   Is off thyroid medicine entirely  Overdue for TSH  Patient Active Problem List   Diagnosis Date Noted  . Preoperative examination 09/26/2017  . H/O falling 05/02/2017  . Elevated serum creatinine 05/02/2017  . Hyperkalemia 05/02/2017  . Depression 11/12/2016  . Right shoulder injury 05/26/2016  . Pain in joint, shoulder  region 04/02/2016  . BPH associated with nocturia 02/17/2016  . Mass in the abdomen 02/09/2016  . Disequilibrium 01/29/2016  . Unsteadiness 01/29/2016  . Family history of prostate cancer 01/07/2016  . Hypothyroidism 01/07/2016  . S/P total knee arthroplasty 12/03/2015  . Routine general medical examination at a health care facility 08/08/2015  . Spinal stenosis in cervical region 02/05/2015  . Cervical spinal stenosis 02/02/2015  . Left shoulder pain 12/06/2014  . Cardiomyopathy, dilated ([place]) 09/16/2014  . Cerebral vascular accident ([place]) 09/16/2014  . Heart valve disease 09/16/2014  . MI (mitral incompetence) 08/08/2014  . TI (tricuspid incompetence) 08/08/2014  . Atrial fibrillation, chronic ([place]) 01/16/2014  . Chronic systolic heart failure ([place]) 01/16/2014  . Bunion, right foot 08/17/2013  . Macular degeneration 10/16/2012  . Encounter for Medicare annual wellness exam 08/16/2012  . Hearing decreased 12/16/2010  . Medication adverse effect 07/28/2010  . Prostate cancer screening 07/28/2010  . Long term (current) use of anticoagulants 07/22/2010  . Hyperlipidemia 06/01/2010  . ATRIAL FLUTTER 11/28/2009  . Bipolar disorder ([place]) 02/20/2009   Past Medical History:  Diagnosis Date  . Anxiety   . Arrhythmia   . Atrial fibrillation ([place])   . Basal cell cancer 2008   on shoulder  . Bipolar disorder (  [place])    hospitalized in past  . CHF (congestive heart failure) ([place])   . CVA (cerebral vascular accident) ([place]) 8/11  . Generalized anxiety disorder   . GERD (gastroesophageal reflux disease)   . History of kidney stones    30 years ago  . Macular degeneration   . Melanoma ([place])    resected from scalp approximately 1 year ago.   . Osteoarthritis    in neck and left knee  . Tremor    noted when writing   Past Surgical History:  Procedure Laterality Date  . BACK SURGERY  1991  . CATARACT EXTRACTION W/ INTRAOCULAR LENS IMPLANT Bilateral   . KNEE ARTHROPLASTY Left  12/03/2015   Procedure: COMPUTER ASSISTED TOTAL KNEE ARTHROPLASTY;  Surgeon: Dereck Leep, MD;  Location: [facility] ORS;  Service: Orthopedics;  Laterality: Left;  . SKIN CANCER EXCISION     Social History   Tobacco Use  . Smoking status: Never Smoker  . Smokeless tobacco: Never Used  Substance Use Topics  . Alcohol use: No    Alcohol/week: 0.0 oz  . Drug use: No   Family History  Problem Relation Age of Onset  . Stroke Father   . Cancer Brother        prostate  . Coronary artery disease Brother   . Hypertension Brother    No Known Allergies Current Outpatient Medications on File Prior to Visit  Medication Sig Dispense Refill  . Acetaminophen 500 MG coapsule Take 2 capsules by mouth every 6 (six) hours as needed for pain.     . Amino Acids (FREE FORM AMINO ACID COMPLEX PO) Take 1 capsule by mouth 2 (two) times daily.     . Ascorbic Acid (VITAMIN C) 500 MG tablet Take 500 mg by mouth 2 (two) times daily.     . B Complex Vitamins (B COMPLEX PO) Take 1 tablet by mouth 2 (two) times daily.     . Calcium-Magnesium-Vitamin D (CALCIUM 500 PO) Take 2 tablets by mouth daily.    . Cyanocobalamin (VITAMIN B-12) 1000 MCG SUBL Place 1 tablet under the tongue daily.     . diclofenac sodium (VOLTAREN) 1 % GEL Apply 2 g topically 3 (three) times daily as needed (pain).     [name] Kitchen escitalopram (LEXAPRO) 20 MG tablet Take 1 tablet (20 mg total) by mouth every morning. (Patient taking differently: Take 10 mg by mouth every morning. ) 90 tablet 1  . furosemide (LASIX) 20 MG tablet Take 20 mg by mouth daily as needed for fluid.     [name] Kitchen L-Methylfolate-Algae (DEPLIN 15) 15-90.314 MG CAPS Take 15 mg by mouth every morning. 28 capsule 0  . loperamide (IMODIUM) 2 MG capsule Take 2 mg by mouth as needed for diarrhea or loose stools.    [name] Kitchen MELATONIN PO Take 2 tablets by mouth at bedtime. Restful Sleep by [facility] : Hops Extract 4:1 (Humulus lupulus) (flower) Chamomile (Matricaria chamomilla) (flower) Passion Flower  Extract (standardized to 4% vitexin) (Passiflora incarnate) (flower) 5 HTP (5-Hydroxtryptophan) (Griffonia simplicifolia) (seed)    . metoprolol succinate (TOPROL-XL) 100 MG 24 hr tablet Take 1 tablet (100 mg total) by mouth daily. Take with or immediately following a meal. 90 tablet 3  . Multiple Vitamin (MULTIVITAMIN) capsule Take 1 capsule by mouth daily.    . sacubitril-valsartan (ENTRESTO) 24-26 MG Take 1 tablet by mouth 2 (two) times daily. 60 tablet 3  . Specialty Vitamins Products (ICAPS LUTEIN-ZEAXANTHIN PO) Take 2 capsules by mouth daily.    [name] Kitchen  Specialty Vitamins Products (PROSTATE PO) Take 1 tablet by mouth daily.     [name] Kitchen warfarin (COUMADIN) 2.5 MG tablet Take 2.5 mg by mouth See admin instructions. 2.5mg  for 3 days,  5mg  on the 4th day, and rotates through a cycle of 2.5 mg for 3 days and 5 mg on the 4th day     No current facility-administered medications on file prior to visit.     Review of Systems  Constitutional: Negative for activity change, appetite change, fatigue, fever and unexpected weight change.  HENT: Negative for congestion, rhinorrhea, sore throat and trouble swallowing.   Eyes: Negative for pain, redness, itching and visual disturbance.  Respiratory: Negative for cough, chest tightness, shortness of breath, wheezing and stridor.   Cardiovascular: Negative for chest pain, palpitations and leg swelling.  Gastrointestinal: Negative for abdominal pain, blood in stool, constipation, diarrhea and nausea.  Endocrine: Negative for cold intolerance, heat intolerance, polydipsia and polyuria.  Genitourinary: Negative for difficulty urinating, dysuria, frequency and urgency.  Musculoskeletal: Positive for arthralgias, back pain and neck pain. Negative for joint swelling and myalgias.       No muscle cramps   Skin: Negative for pallor and rash.       Has a toe nail fungus R great (unsure)  Neurological: Negative for dizziness, tremors, weakness, light-headedness, numbness and  headaches.  Hematological: Negative for adenopathy. Does not bruise/bleed easily.  Psychiatric/Behavioral: Positive for dysphoric mood. Negative for decreased concentration and suicidal ideas. The patient is not nervous/anxious.        Objective:   Physical Exam  Constitutional: He appears well-developed and well-nourished. No distress.  Well appearing elderly male   HENT:  Head: Normocephalic and atraumatic.  Mouth/Throat: Oropharynx is clear and moist.  Eyes: Pupils are equal, round, and reactive to light. Conjunctivae and EOM are normal. Right eye exhibits no discharge. Left eye exhibits no discharge. No scleral icterus.  Neck: Normal range of motion. Neck supple. No JVD present. Carotid bruit is not present. No thyromegaly present.  Cardiovascular: Normal heart sounds and intact distal pulses. Exam reveals no gallop and no friction rub.  No murmur heard. Rate in 90s Irregularly irregular  Pulmonary/Chest: Effort normal and breath sounds normal. No respiratory distress. He has no wheezes. He has no rales.  No crackles  Abdominal: Soft. Bowel sounds are normal. He exhibits no distension, no abdominal bruit and no mass. There is no tenderness.  Musculoskeletal: He exhibits no edema or deformity.  Lymphadenopathy:    He has no cervical adenopathy.  Neurological: He is alert. He has normal reflexes. He displays normal reflexes. No cranial nerve deficit. Coordination normal.  Skin: Skin is warm and dry. Capillary refill takes less than 2 seconds. No rash noted.  R great toe nail is thickened with clearance at the base slt yellow in thickened portion  Psychiatric: His affect is blunt.  Blunted affect- fairly baseline  Pleasant  Answers questions appropriately          Assessment & Plan:   Problem List Items Addressed This Visit      Cardiovascular and Mediastinum   Atrial fibrillation, chronic ([place])    Per pt-in and out of afib w/o symptoms On warfarin  Cardiology will  manage this for his surgery  In afib today  Metoprolol controls rhythm       Cardiomyopathy, dilated ([place])    On entresto and has not been symptomatic  Sees cardiology- per pt was cleared for surgery by them  Renal panel today-  cr up last visit         Endocrine   Hypothyroidism    Off nature thyroid and lost to f/u  TSH today  Has been more depressed       Relevant Orders   TSH     Other   Bipolar disorder ([place])    More depressed lately  Unsure when his psychiatric f/u is  Enc him to do that  He and his wife do not think tx has helped much      Elevated serum creatinine    Lost to fu for this  Last 1.5 (was dehydrated however)  Will re check this today in prep for back surgery       Relevant Orders   Renal function panel   Hyperkalemia    Was up when dehydrated Re check today      Preoperative examination - Primary    Can medically clear once labs are back  Per pt - cardiology already cleared him and will have a plan for his warfarin/a fib  No med allergies  No hx of anaphylaxis No hx of trouble with anesthesia (general or local)  Note to Dr Rhea Bleacher office        Relevant Orders   CBC with Differential/Platelet   Renal function panel

## 2017-09-26 NOTE — Patient Instructions (Addendum)
Unless you are in a fluid restriction- I want to aim for 64 oz of fluid per day (mostly water)   Labs today for kidney function and also blood count and thyroid   Make sure to check in with cardiology regarding the warfarin and your surgery

## 2017-09-26 NOTE — Assessment & Plan Note (Signed)
Per pt-in and out of afib w/o symptoms On warfarin  Cardiology will manage this for his surgery  In afib today  Metoprolol controls rhythm

## 2017-09-26 NOTE — Assessment & Plan Note (Signed)
On entresto and has not been symptomatic  Sees cardiology- per pt was cleared for surgery by them  Renal panel today- cr up last visit

## 2017-09-26 NOTE — Assessment & Plan Note (Signed)
Can medically clear once labs are back  Per pt - cardiology already cleared him and will have a plan for his warfarin/a fib  No med allergies  No hx of anaphylaxis No hx of trouble with anesthesia (general or local)  Note to Dr Rhea Bleacher office

## 2017-09-27 ENCOUNTER — Inpatient Hospital Stay: Admission: RE | Admit: 2017-09-27 | Payer: PPO | Source: Ambulatory Visit

## 2017-09-30 ENCOUNTER — Other Ambulatory Visit: Payer: Self-pay

## 2017-09-30 ENCOUNTER — Encounter
Admission: RE | Admit: 2017-09-30 | Discharge: 2017-09-30 | Disposition: A | Discharge status: Home / Self Care | Payer: PPO | Source: Ambulatory Visit | Attending: Neurosurgery | Admitting: Neurosurgery

## 2017-09-30 DIAGNOSIS — E039 Hypothyroidism, unspecified: Secondary | ICD-10-CM | POA: Diagnosis not present

## 2017-09-30 DIAGNOSIS — Z7901 Long term (current) use of anticoagulants: Secondary | ICD-10-CM | POA: Diagnosis not present

## 2017-09-30 DIAGNOSIS — I429 Cardiomyopathy, unspecified: Secondary | ICD-10-CM | POA: Insufficient documentation

## 2017-09-30 DIAGNOSIS — Z01812 Encounter for preprocedural laboratory examination: Secondary | ICD-10-CM | POA: Diagnosis not present

## 2017-09-30 DIAGNOSIS — E875 Hyperkalemia: Secondary | ICD-10-CM | POA: Diagnosis not present

## 2017-09-30 DIAGNOSIS — F319 Bipolar disorder, unspecified: Secondary | ICD-10-CM | POA: Insufficient documentation

## 2017-09-30 DIAGNOSIS — I482 Chronic atrial fibrillation: Secondary | ICD-10-CM | POA: Insufficient documentation

## 2017-09-30 DIAGNOSIS — Z79899 Other long term (current) drug therapy: Secondary | ICD-10-CM | POA: Diagnosis not present

## 2017-09-30 HISTORY — DX: Endocarditis, valve unspecified: I38

## 2017-09-30 HISTORY — DX: Depression, unspecified: F32.A

## 2017-09-30 HISTORY — DX: Cardiomyopathy, unspecified: I42.9

## 2017-09-30 HISTORY — DX: Hypothyroidism, unspecified: E03.9

## 2017-09-30 HISTORY — DX: Hyperlipidemia, unspecified: E78.5

## 2017-09-30 HISTORY — DX: Major depressive disorder, single episode, unspecified: F32.9

## 2017-09-30 LAB — SURGICAL PCR SCREEN
MRSA, PCR: NEGATIVE
Staphylococcus aureus: NEGATIVE

## 2017-09-30 LAB — APTT: APTT: 38 s — AB (ref 24–36)

## 2017-09-30 LAB — PROTIME-INR
INR: 1.91
Prothrombin Time: 21.7 seconds — ABNORMAL HIGH (ref 11.4–15.2)

## 2017-09-30 LAB — TYPE AND SCREEN
ABO/RH(D): A POS
ANTIBODY SCREEN: NEGATIVE

## 2017-09-30 LAB — URINALYSIS, ROUTINE W REFLEX MICROSCOPIC
BILIRUBIN URINE: NEGATIVE
Glucose, UA: NEGATIVE mg/dL
HGB URINE DIPSTICK: NEGATIVE
Ketones, ur: NEGATIVE mg/dL
Leukocytes, UA: NEGATIVE
Nitrite: NEGATIVE
Protein, ur: NEGATIVE mg/dL
SPECIFIC GRAVITY, URINE: 1.013 (ref 1.005–1.030)
pH: 7 (ref 5.0–8.0)

## 2017-09-30 NOTE — Patient Instructions (Signed)
Your procedure is scheduled on: Wednesday, October 05, 2017 Report to Day Surgery on the 2nd floor of the [facility]. To find out your arrival time, please call [phone] between 1PM - 3PM on: Tuesday, October 04, 2017  REMEMBER: Instructions that are not followed completely may result in serious medical risk, up to and including death; or upon the discretion of your surgeon and anesthesiologist your surgery may need to be rescheduled.  Do not eat food after midnight the night before your procedure.  No gum chewing, lozengers or hard candies.  You may however, drink CLEAR liquids up to 2 hours before you are scheduled to arrive for your surgery. Do not drink anything within 2 hours of the start of your surgery.  Clear liquids include: - water  - apple juice without pulp - clear gatorade - black coffee or tea (Do NOT add anything to the coffee or tea) Do NOT drink anything that is not on this list.  No Alcohol for 24 hours before or after surgery.  No Smoking including e-cigarettes for 24 hours prior to surgery.  No chewable tobacco products for at least 6 hours prior to surgery.  No nicotine patches on the day of surgery.  On the morning of surgery brush your teeth with toothpaste and water, you may rinse your mouth with mouthwash if you wish. Do not swallow any toothpaste or mouthwash.  Notify your doctor if there is any change in your medical condition (cold, fever, infection).  Do not wear jewelry, make-up, hairpins, clips or nail polish.  Do not wear lotions, powders, or perfumes. You may wear deodorant.  Do not shave 48 hours prior to surgery. Men may shave face and neck.  Contacts and dentures may not be worn into surgery.  Do not bring valuables to the hospital, including drivers license, insurance or credit cards.  [facility] is not responsible for any belongings or valuables.   TAKE THESE MEDICATIONS THE MORNING OF SURGERY:  1.  lexapro 2.  metoprolol  Use CHG  Soap as directed on instruction sheet.  Follow recommendations from Cardiologist or PCP regarding stopping Coumadin.  NOW!  Stop Anti-inflammatories (NSAIDS) such as Advil, Aleve, Ibuprofen, Motrin, Naproxen, Naprosyn and Aspirin based products such as Excedrin, Goodys Powder, BC Powder. (May take Tylenol or Acetaminophen if needed.)  NOW!  Stop ANY OVER THE COUNTER supplements until after surgery. (AMINO ACID, VITAMIN C, CALCIUM, L-METHYLFOLATE, MELATONIN, ICAPS, PROSTATE) (May continue Vitamin B and multivitamin.)  Wear comfortable clothing (specific to your surgery type) to the hospital.  Plan for stool softeners for home use.  If you are being admitted to the hospital overnight, leave your suitcase in the car. After surgery it may be brought to your room.  Please call [phone] if you have any questions about these instructions.

## 2017-10-02 DIAGNOSIS — Z9181 History of falling: Secondary | ICD-10-CM | POA: Diagnosis not present

## 2017-10-02 DIAGNOSIS — Z85820 Personal history of malignant melanoma of skin: Secondary | ICD-10-CM | POA: Diagnosis not present

## 2017-10-02 DIAGNOSIS — I4891 Unspecified atrial fibrillation: Secondary | ICD-10-CM | POA: Diagnosis not present

## 2017-10-02 DIAGNOSIS — Z8673 Personal history of transient ischemic attack (TIA), and cerebral infarction without residual deficits: Secondary | ICD-10-CM | POA: Diagnosis not present

## 2017-10-02 DIAGNOSIS — I420 Dilated cardiomyopathy: Secondary | ICD-10-CM | POA: Diagnosis not present

## 2017-10-02 DIAGNOSIS — Z7901 Long term (current) use of anticoagulants: Secondary | ICD-10-CM | POA: Diagnosis not present

## 2017-10-02 DIAGNOSIS — F411 Generalized anxiety disorder: Secondary | ICD-10-CM | POA: Diagnosis not present

## 2017-10-02 DIAGNOSIS — Z96642 Presence of left artificial hip joint: Secondary | ICD-10-CM | POA: Diagnosis not present

## 2017-10-02 DIAGNOSIS — H3530 Unspecified macular degeneration: Secondary | ICD-10-CM | POA: Diagnosis not present

## 2017-10-02 DIAGNOSIS — M48062 Spinal stenosis, lumbar region with neurogenic claudication: Secondary | ICD-10-CM | POA: Diagnosis not present

## 2017-10-02 DIAGNOSIS — M47816 Spondylosis without myelopathy or radiculopathy, lumbar region: Secondary | ICD-10-CM | POA: Diagnosis not present

## 2017-10-02 DIAGNOSIS — F319 Bipolar disorder, unspecified: Secondary | ICD-10-CM | POA: Diagnosis not present

## 2017-10-03 NOTE — Pre-Procedure Instructions (Signed)
PT,APTT FAXED TO DR Izora Ribas

## 2017-10-05 ENCOUNTER — Ambulatory Visit: Payer: PPO

## 2017-10-05 ENCOUNTER — Ambulatory Visit: Payer: PPO | Admitting: Anesthesiology

## 2017-10-05 ENCOUNTER — Encounter: Payer: Self-pay | Admitting: *Deleted

## 2017-10-05 ENCOUNTER — Other Ambulatory Visit: Payer: Self-pay

## 2017-10-05 ENCOUNTER — Observation Stay
Admission: RE | Admit: 2017-10-05 | Discharge: 2017-10-06 | Disposition: A | Discharge status: Home / Self Care | Payer: PPO | Source: Ambulatory Visit | Attending: Neurosurgery | Admitting: Neurosurgery

## 2017-10-05 ENCOUNTER — Encounter
Admission: RE | Disposition: A | Discharge status: Home / Self Care | Payer: Self-pay | Source: Ambulatory Visit | Attending: Neurosurgery

## 2017-10-05 DIAGNOSIS — I081 Rheumatic disorders of both mitral and tricuspid valves: Secondary | ICD-10-CM | POA: Insufficient documentation

## 2017-10-05 DIAGNOSIS — M5126 Other intervertebral disc displacement, lumbar region: Secondary | ICD-10-CM | POA: Diagnosis not present

## 2017-10-05 DIAGNOSIS — E039 Hypothyroidism, unspecified: Secondary | ICD-10-CM | POA: Insufficient documentation

## 2017-10-05 DIAGNOSIS — Z8249 Family history of ischemic heart disease and other diseases of the circulatory system: Secondary | ICD-10-CM | POA: Insufficient documentation

## 2017-10-05 DIAGNOSIS — K219 Gastro-esophageal reflux disease without esophagitis: Secondary | ICD-10-CM | POA: Diagnosis not present

## 2017-10-05 DIAGNOSIS — Z96652 Presence of left artificial knee joint: Secondary | ICD-10-CM | POA: Diagnosis not present

## 2017-10-05 DIAGNOSIS — M47812 Spondylosis without myelopathy or radiculopathy, cervical region: Secondary | ICD-10-CM | POA: Insufficient documentation

## 2017-10-05 DIAGNOSIS — I420 Dilated cardiomyopathy: Secondary | ICD-10-CM | POA: Insufficient documentation

## 2017-10-05 DIAGNOSIS — I509 Heart failure, unspecified: Secondary | ICD-10-CM | POA: Insufficient documentation

## 2017-10-05 DIAGNOSIS — F411 Generalized anxiety disorder: Secondary | ICD-10-CM | POA: Insufficient documentation

## 2017-10-05 DIAGNOSIS — Z85820 Personal history of malignant melanoma of skin: Secondary | ICD-10-CM | POA: Insufficient documentation

## 2017-10-05 DIAGNOSIS — F319 Bipolar disorder, unspecified: Secondary | ICD-10-CM | POA: Diagnosis not present

## 2017-10-05 DIAGNOSIS — Z7901 Long term (current) use of anticoagulants: Secondary | ICD-10-CM | POA: Insufficient documentation

## 2017-10-05 DIAGNOSIS — G9519 Other vascular myelopathies: Secondary | ICD-10-CM | POA: Diagnosis present

## 2017-10-05 DIAGNOSIS — I4891 Unspecified atrial fibrillation: Secondary | ICD-10-CM | POA: Diagnosis not present

## 2017-10-05 DIAGNOSIS — R69 Illness, unspecified: Secondary | ICD-10-CM | POA: Diagnosis not present

## 2017-10-05 DIAGNOSIS — F419 Anxiety disorder, unspecified: Secondary | ICD-10-CM | POA: Diagnosis not present

## 2017-10-05 DIAGNOSIS — Z419 Encounter for procedure for purposes other than remedying health state, unspecified: Secondary | ICD-10-CM

## 2017-10-05 DIAGNOSIS — M48062 Spinal stenosis, lumbar region with neurogenic claudication: Principal | ICD-10-CM | POA: Insufficient documentation

## 2017-10-05 DIAGNOSIS — I69351 Hemiplegia and hemiparesis following cerebral infarction affecting right dominant side: Secondary | ICD-10-CM | POA: Diagnosis not present

## 2017-10-05 DIAGNOSIS — E785 Hyperlipidemia, unspecified: Secondary | ICD-10-CM | POA: Insufficient documentation

## 2017-10-05 DIAGNOSIS — M1712 Unilateral primary osteoarthritis, left knee: Secondary | ICD-10-CM | POA: Diagnosis not present

## 2017-10-05 DIAGNOSIS — R29818 Other symptoms and signs involving the nervous system: Secondary | ICD-10-CM | POA: Diagnosis present

## 2017-10-05 DIAGNOSIS — Z981 Arthrodesis status: Secondary | ICD-10-CM | POA: Diagnosis not present

## 2017-10-05 DIAGNOSIS — Z79899 Other long term (current) drug therapy: Secondary | ICD-10-CM | POA: Diagnosis not present

## 2017-10-05 DIAGNOSIS — M1990 Unspecified osteoarthritis, unspecified site: Secondary | ICD-10-CM | POA: Diagnosis not present

## 2017-10-05 HISTORY — PX: LUMBAR LAMINECTOMY/DECOMPRESSION MICRODISCECTOMY: SHX5026

## 2017-10-05 LAB — PROTIME-INR
INR: 1.17
PROTHROMBIN TIME: 14.8 s (ref 11.4–15.2)

## 2017-10-05 LAB — APTT: APTT: 32 s (ref 24–36)

## 2017-10-05 SURGERY — LUMBAR LAMINECTOMY/DECOMPRESSION MICRODISCECTOMY 3 LEVELS
Anesthesia: General | Site: Back | Wound class: Clean

## 2017-10-05 MED ORDER — SODIUM CHLORIDE 0.9 % IR SOLN
Status: DC | PRN
  Administered 2017-10-05: 1000 mL

## 2017-10-05 MED ORDER — DEXAMETHASONE SODIUM PHOSPHATE 10 MG/ML IJ SOLN
INTRAMUSCULAR | Status: AC
  Filled 2017-10-05: qty 1

## 2017-10-05 MED ORDER — LACTATED RINGERS IV SOLN
INTRAVENOUS | Status: DC
  Administered 2017-10-05 (×2): via INTRAVENOUS

## 2017-10-05 MED ORDER — SACUBITRIL-VALSARTAN 24-26 MG PO TABS
1.0000 | ORAL_TABLET | Freq: Two times a day (BID) | ORAL | Status: DC
  Administered 2017-10-05 – 2017-10-06 (×3): 1 via ORAL
  Filled 2017-10-05 (×4): qty 1

## 2017-10-05 MED ORDER — BUPIVACAINE HCL (PF) 0.5 % IJ SOLN
INTRAMUSCULAR | Status: AC
  Filled 2017-10-05: qty 30

## 2017-10-05 MED ORDER — OXYCODONE HCL 5 MG PO TABS
5.0000 mg | ORAL_TABLET | ORAL | Status: DC | PRN
  Administered 2017-10-05: 5 mg via ORAL
  Filled 2017-10-05: qty 1

## 2017-10-05 MED ORDER — STERILE WATER FOR INJECTION IJ SOLN
INTRAMUSCULAR | Status: AC
  Filled 2017-10-05: qty 10

## 2017-10-05 MED ORDER — ROCURONIUM BROMIDE 50 MG/5ML IV SOLN
INTRAVENOUS | Status: AC
  Filled 2017-10-05: qty 1

## 2017-10-05 MED ORDER — CEFAZOLIN SODIUM-DEXTROSE 2-4 GM/100ML-% IV SOLN
2.0000 g | INTRAVENOUS | Status: AC
  Administered 2017-10-05: 2 g via INTRAVENOUS

## 2017-10-05 MED ORDER — SODIUM CHLORIDE FLUSH 0.9 % IV SOLN
INTRAVENOUS | Status: AC
  Filled 2017-10-05: qty 10

## 2017-10-05 MED ORDER — METHOCARBAMOL 500 MG PO TABS: 500.0000 mg | ORAL_TABLET | Freq: Four times a day (QID) | ORAL | Status: DC | PRN

## 2017-10-05 MED ORDER — SUGAMMADEX SODIUM 200 MG/2ML IV SOLN
INTRAVENOUS | Status: DC | PRN
  Administered 2017-10-05: 150 mg via INTRAVENOUS
  Administered 2017-10-05: 50 mg via INTRAVENOUS

## 2017-10-05 MED ORDER — ESCITALOPRAM OXALATE 10 MG PO TABS
10.0000 mg | ORAL_TABLET | ORAL | Status: DC
  Administered 2017-10-06: 10 mg via ORAL
  Filled 2017-10-05: qty 1

## 2017-10-05 MED ORDER — FUROSEMIDE 20 MG PO TABS: 20.0000 mg | ORAL_TABLET | Freq: Every day | ORAL | Status: DC | PRN

## 2017-10-05 MED ORDER — FAMOTIDINE 20 MG PO TABS
ORAL_TABLET | ORAL | Status: AC
  Administered 2017-10-05: 20 mg via ORAL
  Filled 2017-10-05: qty 1

## 2017-10-05 MED ORDER — ACETAMINOPHEN 650 MG RE SUPP: 650.0000 mg | RECTAL | Status: DC | PRN

## 2017-10-05 MED ORDER — LIDOCAINE HCL (PF) 2 % IJ SOLN
INTRAMUSCULAR | Status: AC
  Filled 2017-10-05: qty 10

## 2017-10-05 MED ORDER — SODIUM CHLORIDE 0.9% FLUSH: 3.0000 mL | INTRAVENOUS | Status: DC | PRN

## 2017-10-05 MED ORDER — HYDROMORPHONE HCL 1 MG/ML IJ SOLN: 0.5000 mg | INTRAMUSCULAR | Status: DC | PRN

## 2017-10-05 MED ORDER — HYDROMORPHONE HCL 1 MG/ML IJ SOLN
INTRAMUSCULAR | Status: AC
Start: 2017-10-05 — End: ?
  Filled 2017-10-05: qty 1

## 2017-10-05 MED ORDER — METOPROLOL SUCCINATE ER 50 MG PO TB24
100.0000 mg | ORAL_TABLET | Freq: Every day | ORAL | Status: DC
  Administered 2017-10-06: 100 mg via ORAL
  Filled 2017-10-05 (×2): qty 2

## 2017-10-05 MED ORDER — SODIUM CHLORIDE 0.9 % IV SOLN
INTRAVENOUS | Status: DC
  Administered 2017-10-05 – 2017-10-06 (×2): via INTRAVENOUS

## 2017-10-05 MED ORDER — ONDANSETRON HCL 4 MG/2ML IJ SOLN
INTRAMUSCULAR | Status: DC | PRN
  Administered 2017-10-05: 4 mg via INTRAVENOUS

## 2017-10-05 MED ORDER — BUPIVACAINE-EPINEPHRINE (PF) 0.5% -1:200000 IJ SOLN
INTRAMUSCULAR | Status: DC | PRN
  Administered 2017-10-05: 20 mL

## 2017-10-05 MED ORDER — DEXAMETHASONE SODIUM PHOSPHATE 10 MG/ML IJ SOLN
INTRAMUSCULAR | Status: DC | PRN
  Administered 2017-10-05: 10 mg via INTRAVENOUS

## 2017-10-05 MED ORDER — DEPLIN 15 15-90.314 MG PO CAPS: 15.0000 mg | ORAL_CAPSULE | ORAL | Status: DC

## 2017-10-05 MED ORDER — FENTANYL CITRATE (PF) 100 MCG/2ML IJ SOLN
INTRAMUSCULAR | Status: DC | PRN
  Administered 2017-10-05: 100 ug via INTRAVENOUS

## 2017-10-05 MED ORDER — SUGAMMADEX SODIUM 200 MG/2ML IV SOLN
INTRAVENOUS | Status: AC
  Filled 2017-10-05: qty 2

## 2017-10-05 MED ORDER — FENTANYL CITRATE (PF) 100 MCG/2ML IJ SOLN
INTRAMUSCULAR | Status: AC
  Filled 2017-10-05: qty 2

## 2017-10-05 MED ORDER — SODIUM CHLORIDE 0.9 % IV SOLN
INTRAVENOUS | Status: DC | PRN
  Administered 2017-10-05: 40 mL

## 2017-10-05 MED ORDER — SODIUM CHLORIDE 0.9% FLUSH
3.0000 mL | Freq: Two times a day (BID) | INTRAVENOUS | Status: DC
  Administered 2017-10-05 – 2017-10-06 (×2): 3 mL via INTRAVENOUS

## 2017-10-05 MED ORDER — PROPOFOL 10 MG/ML IV BOLUS
INTRAVENOUS | Status: DC | PRN
  Administered 2017-10-05: 120 mg via INTRAVENOUS

## 2017-10-05 MED ORDER — ACETAMINOPHEN 10 MG/ML IV SOLN
INTRAVENOUS | Status: AC
  Filled 2017-10-05: qty 100

## 2017-10-05 MED ORDER — ONDANSETRON HCL 4 MG/2ML IJ SOLN
INTRAMUSCULAR | Status: AC
  Filled 2017-10-05: qty 2

## 2017-10-05 MED ORDER — SODIUM CHLORIDE 0.9 % IV SOLN
INTRAVENOUS | Status: DC | PRN
  Administered 2017-10-05: 20 ug/min via INTRAVENOUS

## 2017-10-05 MED ORDER — METHYLPREDNISOLONE ACETATE 40 MG/ML IJ SUSP
INTRAMUSCULAR | Status: AC
  Filled 2017-10-05: qty 1

## 2017-10-05 MED ORDER — METHOCARBAMOL 1000 MG/10ML IJ SOLN
500.0000 mg | Freq: Four times a day (QID) | INTRAVENOUS | Status: DC | PRN
  Filled 2017-10-05: qty 5

## 2017-10-05 MED ORDER — BUPIVACAINE-EPINEPHRINE (PF) 0.5% -1:200000 IJ SOLN
INTRAMUSCULAR | Status: AC
  Filled 2017-10-05: qty 30

## 2017-10-05 MED ORDER — SODIUM CHLORIDE FLUSH 0.9 % IV SOLN
INTRAVENOUS | Status: AC
  Filled 2017-10-05: qty 40

## 2017-10-05 MED ORDER — ONDANSETRON HCL 4 MG PO TABS: 4.0000 mg | ORAL_TABLET | Freq: Four times a day (QID) | ORAL | Status: DC | PRN

## 2017-10-05 MED ORDER — MEPERIDINE HCL 50 MG/ML IJ SOLN: 6.2500 mg | INTRAMUSCULAR | Status: DC | PRN

## 2017-10-05 MED ORDER — HYDROMORPHONE HCL 1 MG/ML IJ SOLN
INTRAMUSCULAR | Status: AC
  Filled 2017-10-05: qty 1

## 2017-10-05 MED ORDER — FENTANYL CITRATE (PF) 100 MCG/2ML IJ SOLN: 25.0000 ug | INTRAMUSCULAR | Status: DC | PRN

## 2017-10-05 MED ORDER — BACITRACIN 50000 UNITS IM SOLR
INTRAMUSCULAR | Status: AC
  Filled 2017-10-05: qty 1

## 2017-10-05 MED ORDER — PROPOFOL 500 MG/50ML IV EMUL
INTRAVENOUS | Status: AC
  Filled 2017-10-05: qty 50

## 2017-10-05 MED ORDER — BUPIVACAINE HCL (PF) 0.5 % IJ SOLN
INTRAMUSCULAR | Status: DC | PRN
  Administered 2017-10-05: 10 mL

## 2017-10-05 MED ORDER — CEFAZOLIN SODIUM-DEXTROSE 2-4 GM/100ML-% IV SOLN
INTRAVENOUS | Status: AC
  Filled 2017-10-05: qty 100

## 2017-10-05 MED ORDER — ACETAMINOPHEN 325 MG PO TABS: 650.0000 mg | ORAL_TABLET | ORAL | Status: DC | PRN

## 2017-10-05 MED ORDER — BUPIVACAINE LIPOSOME 1.3 % IJ SUSP
INTRAMUSCULAR | Status: AC
  Filled 2017-10-05: qty 20

## 2017-10-05 MED ORDER — GLYCOPYRROLATE 0.2 MG/ML IJ SOLN
INTRAMUSCULAR | Status: AC
  Filled 2017-10-05: qty 1

## 2017-10-05 MED ORDER — PHENYLEPHRINE HCL 10 MG/ML IJ SOLN
INTRAMUSCULAR | Status: DC | PRN
  Administered 2017-10-05 (×7): 100 ug via INTRAVENOUS

## 2017-10-05 MED ORDER — NALOXONE HCL 2 MG/2ML IJ SOSY
PREFILLED_SYRINGE | INTRAMUSCULAR | Status: AC
  Administered 2017-10-05: 0.1 mg via INTRAVENOUS
  Filled 2017-10-05: qty 2

## 2017-10-05 MED ORDER — PROMETHAZINE HCL 25 MG/ML IJ SOLN: 6.2500 mg | INTRAMUSCULAR | Status: DC | PRN

## 2017-10-05 MED ORDER — LIDOCAINE HCL (CARDIAC) PF 100 MG/5ML IV SOSY
PREFILLED_SYRINGE | INTRAVENOUS | Status: DC | PRN
  Administered 2017-10-05: 60 mg via INTRAVENOUS

## 2017-10-05 MED ORDER — NALOXONE HCL 2 MG/2ML IJ SOSY
0.1000 mg | PREFILLED_SYRINGE | INTRAMUSCULAR | Status: DC | PRN
  Administered 2017-10-05: 0.1 mg via INTRAVENOUS

## 2017-10-05 MED ORDER — FAMOTIDINE 20 MG PO TABS
20.0000 mg | ORAL_TABLET | Freq: Once | ORAL | Status: AC
  Administered 2017-10-05: 20 mg via ORAL

## 2017-10-05 MED ORDER — ACETAMINOPHEN 160 MG/5ML PO SOLN
325.0000 mg | ORAL | Status: DC | PRN
  Filled 2017-10-05: qty 20.3

## 2017-10-05 MED ORDER — DEXAMETHASONE SODIUM PHOSPHATE 4 MG/ML IJ SOLN
4.0000 mg | Freq: Three times a day (TID) | INTRAMUSCULAR | Status: DC
  Administered 2017-10-05 – 2017-10-06 (×3): 4 mg via INTRAVENOUS
  Filled 2017-10-05 (×5): qty 1

## 2017-10-05 MED ORDER — SUCCINYLCHOLINE CHLORIDE 20 MG/ML IJ SOLN
INTRAMUSCULAR | Status: AC
  Filled 2017-10-05: qty 1

## 2017-10-05 MED ORDER — ACETAMINOPHEN 500 MG PO TABS
1000.0000 mg | ORAL_TABLET | Freq: Four times a day (QID) | ORAL | Status: AC
  Administered 2017-10-05 – 2017-10-06 (×4): 1000 mg via ORAL
  Filled 2017-10-05 (×4): qty 2

## 2017-10-05 MED ORDER — PHENYLEPHRINE HCL 10 MG/ML IJ SOLN
INTRAMUSCULAR | Status: AC
  Filled 2017-10-05: qty 1

## 2017-10-05 MED ORDER — ACETAMINOPHEN 10 MG/ML IV SOLN
INTRAVENOUS | Status: DC | PRN
  Administered 2017-10-05: 1000 mg via INTRAVENOUS

## 2017-10-05 MED ORDER — HYDROCODONE-ACETAMINOPHEN 7.5-325 MG PO TABS: 1.0000 | ORAL_TABLET | Freq: Once | ORAL | Status: DC | PRN

## 2017-10-05 MED ORDER — METHYLPREDNISOLONE ACETATE 40 MG/ML IJ SUSP
INTRAMUSCULAR | Status: DC | PRN
  Administered 2017-10-05: 40 mg

## 2017-10-05 MED ORDER — MENTHOL 3 MG MT LOZG
1.0000 | LOZENGE | OROMUCOSAL | Status: DC | PRN
  Filled 2017-10-05: qty 9

## 2017-10-05 MED ORDER — ROCURONIUM BROMIDE 100 MG/10ML IV SOLN
INTRAVENOUS | Status: DC | PRN
  Administered 2017-10-05: 40 mg via INTRAVENOUS
  Administered 2017-10-05 (×3): 10 mg via INTRAVENOUS

## 2017-10-05 MED ORDER — PHENOL 1.4 % MT LIQD
1.0000 | OROMUCOSAL | Status: DC | PRN
  Filled 2017-10-05: qty 177

## 2017-10-05 MED ORDER — ACETAMINOPHEN 325 MG PO TABS: 325.0000 mg | ORAL_TABLET | ORAL | Status: DC | PRN

## 2017-10-05 MED ORDER — SEVOFLURANE IN SOLN
RESPIRATORY_TRACT | Status: AC
  Filled 2017-10-05: qty 250

## 2017-10-05 MED ORDER — SODIUM CHLORIDE 0.9 % IV SOLN: 250.0000 mL | INTRAVENOUS | Status: DC

## 2017-10-05 MED ORDER — HYDROMORPHONE HCL 1 MG/ML IJ SOLN
INTRAMUSCULAR | Status: DC | PRN
  Administered 2017-10-05 (×4): 0.5 mg via INTRAVENOUS

## 2017-10-05 MED ORDER — SENNOSIDES-DOCUSATE SODIUM 8.6-50 MG PO TABS
1.0000 | ORAL_TABLET | Freq: Every evening | ORAL | Status: DC | PRN
  Administered 2017-10-05: 1 via ORAL
  Filled 2017-10-05: qty 1

## 2017-10-05 MED ORDER — ONDANSETRON HCL 4 MG/2ML IJ SOLN: 4.0000 mg | Freq: Four times a day (QID) | INTRAMUSCULAR | Status: DC | PRN

## 2017-10-05 MED ORDER — OXYCODONE HCL 5 MG PO TABS: 10.0000 mg | ORAL_TABLET | ORAL | Status: DC | PRN

## 2017-10-05 MED ORDER — EPHEDRINE SULFATE 50 MG/ML IJ SOLN
INTRAMUSCULAR | Status: AC
  Filled 2017-10-05: qty 1

## 2017-10-05 SURGICAL SUPPLY — 54 items
BUR NEURO DRILL SOFT 3.0X3.8M (BURR) ×3 IMPLANT
CANISTER SUCT 1200ML W/VALVE (MISCELLANEOUS) ×6 IMPLANT
CHLORAPREP W/TINT 26ML (MISCELLANEOUS) ×6 IMPLANT
CNTNR SPEC 2.5X3XGRAD LEK (MISCELLANEOUS) ×1
CONT SPEC 4OZ STER OR WHT (MISCELLANEOUS) ×2
CONTAINER SPEC 2.5X3XGRAD LEK (MISCELLANEOUS) ×1 IMPLANT
COUNTER NEEDLE 20/40 LG (NEEDLE) ×3 IMPLANT
COVER LIGHT HANDLE STERIS (MISCELLANEOUS) ×6 IMPLANT
CUP MEDICINE 2OZ PLAST GRAD ST (MISCELLANEOUS) ×6 IMPLANT
DERMABOND ADVANCED (GAUZE/BANDAGES/DRESSINGS) ×2
DERMABOND ADVANCED .7 DNX12 (GAUZE/BANDAGES/DRESSINGS) ×1 IMPLANT
DRAPE C-ARM 42X72 X-RAY (DRAPES) ×6 IMPLANT
DRAPE LAPAROTOMY 100X77 ABD (DRAPES) ×3 IMPLANT
DRAPE MICROSCOPE SPINE 48X150 (DRAPES) ×3 IMPLANT
DRAPE SURG 17X11 SM STRL (DRAPES) ×12 IMPLANT
ELECT CAUTERY BLADE TIP 2.5 (TIP) ×3
ELECT EZSTD 165MM 6.5IN (MISCELLANEOUS) ×3
ELECTRODE CAUTERY BLDE TIP 2.5 (TIP) ×1 IMPLANT
ELECTRODE EZSTD 165MM 6.5IN (MISCELLANEOUS) ×1 IMPLANT
FRAME EYE SHIELD (PROTECTIVE WEAR) ×6 IMPLANT
GLOVE BIOGEL PI IND STRL 7.0 (GLOVE) ×1 IMPLANT
GLOVE BIOGEL PI INDICATOR 7.0 (GLOVE) ×2
GLOVE SURG SYN 6.5 ES PF (GLOVE) ×6 IMPLANT
GLOVE SURG SYN 8.5  E (GLOVE) ×6
GLOVE SURG SYN 8.5 E (GLOVE) ×3 IMPLANT
GOWN SRG XL LVL 3 NONREINFORCE (GOWNS) ×1 IMPLANT
GOWN STRL NON-REIN TWL XL LVL3 (GOWNS) ×2
GOWN STRL REUS W/TWL MED LVL3 (GOWN DISPOSABLE) ×3 IMPLANT
GRADUATE 1200CC STRL 31836 (MISCELLANEOUS) ×3 IMPLANT
KIT SPINAL PRONEVIEW (KITS) ×3 IMPLANT
KNIFE BAYONET SHORT DISCETOMY (MISCELLANEOUS) IMPLANT
MARKER SKIN DUAL TIP RULER LAB (MISCELLANEOUS) ×6 IMPLANT
NDL SAFETY ECLIPSE 18X1.5 (NEEDLE) IMPLANT
NEEDLE HYPO 18GX1.5 SHARP (NEEDLE)
NEEDLE HYPO 22GX1.5 SAFETY (NEEDLE) ×3 IMPLANT
NS IRRIG 1000ML POUR BTL (IV SOLUTION) ×3 IMPLANT
PACK LAMINECTOMY NEURO (CUSTOM PROCEDURE TRAY) ×3 IMPLANT
PAD ARMBOARD 7.5X6 YLW CONV (MISCELLANEOUS) ×3 IMPLANT
SPOGE SURGIFLO 8M (HEMOSTASIS) ×2
SPONGE SURGIFLO 8M (HEMOSTASIS) ×1 IMPLANT
STAPLER SKIN PROX 35W (STAPLE) IMPLANT
SUT DVC VLOC 3-0 CL 6 P-12 (SUTURE) ×3 IMPLANT
SUT NURALON 4 0 TR CR/8 (SUTURE) IMPLANT
SUT VIC AB 0 CT1 27 (SUTURE)
SUT VIC AB 0 CT1 27XCR 8 STRN (SUTURE) IMPLANT
SUT VIC AB 2-0 CT1 18 (SUTURE) ×3 IMPLANT
SUT VICRYL 0 AB UR-6 (SUTURE) ×3 IMPLANT
SYR 20CC LL (SYRINGE) ×3 IMPLANT
SYR 3ML LL SCALE MARK (SYRINGE) ×3 IMPLANT
TOWEL OR 17X26 4PK STRL BLUE (TOWEL DISPOSABLE) ×9 IMPLANT
TUBE MATRX SPINL 18MM 6CM DISP (INSTRUMENTS) ×2
TUBE METRX SPINAL 18X6 DISP (INSTRUMENTS) ×1 IMPLANT
TUBING CONNECTING 10 (TUBING) ×2 IMPLANT
TUBING CONNECTING 10' (TUBING) ×1

## 2017-10-05 NOTE — Progress Notes (Signed)
Procedure: L2-5 lumbar decompression  Procedure date: 10/05/2017 Diagnosis: neurogenic claudication  History: David Duncan is  POD0 s/p L2/5 lumbar decompression. He is more attentive and responsive than he was in recovery. Continues to deny pain in back and legs, however leg symptoms usually present while ambulating.  No new symptoms, numbness, tingling.   Physical Exam: Vitals:   10/05/17 1248 10/05/17 1345  BP: 136/89 (!) 139/98  Pulse: 77 82  Resp:    Temp: 97.6 F (36.4 C) (!) 97.5 F (36.4 C)  SpO2: 95% 95%    AA Ox3 CNI Strength: 5/5 throughout except 4+/5 right illiopsoas.  Sensation: intact upper and lower extremities.   Data:  No results for input(s): NA, K, CL, CO2, BUN, CREATININE, LABGLOM, GLUCOSE, CALCIUM in the last 168 hours. No results for input(s): AST, ALT, ALKPHOS in the last 168 hours.  Invalid input(s): TBILI   No results for input(s): WBC, HGB, HCT, PLT in the last 168 hours. Recent Labs  Lab 09/30/17 1422 10/05/17 0650  APTT 38* 32  INR 1.91 1.17          Assessment/Plan:  David Duncan is POD0 s/p L2/5 lumbar decompression. Will continue to observe for adequate pain control.  mobilize pain control DVT prophylaxis  Marin Olp PA-C Department of Neurosurgery

## 2017-10-05 NOTE — Anesthesia Preprocedure Evaluation (Addendum)
Anesthesia Evaluation  Patient identified by MRN, date of birth, ID band Patient awake    Reviewed: Allergy & Precautions, H&P , NPO status , reviewed documented beta blocker date and time   Airway Mallampati: II  TM Distance: >3 FB Neck ROM: limited    Dental  (+) Upper Dentures, Lower Dentures   Pulmonary    Pulmonary exam normal        Cardiovascular +CHF   Rhythm:irregular  ECHO 07/2017 MODERATE LV SYSTOLIC DYSFUNCTION WITH AN ESTIMATED EF = 30 % NORMAL RIGHT VENTRICULAR SYSTOLIC FUNCTION SEVERE TRICUSPID AND MITRAL VALVE INSUFFICIENCY NO VALVULAR STENOSIS MODERATE-TO-SEVERE RA ENLARGEMENT MODERATE LA ENLARGEMENT MILD RV ENLARGEMENT    Neuro/Psych PSYCHIATRIC DISORDERS Anxiety Depression Bipolar Disorder Mild R hand weakness CVA, Residual Symptoms    GI/Hepatic GERD  Controlled,  Endo/Other  Hypothyroidism   Renal/GU      Musculoskeletal  (+) Arthritis ,   Abdominal   Peds  Hematology   Anesthesia Other Findings Past Medical History: No date: Anxiety No date: Arrhythmia No date: Atrial fibrillation [facility]) 2008: Basal cell cancer     Comment:  on shoulder No date: Bipolar disorder [facility])     Comment:  hospitalized in past No date: Cardiomyopathy [facility]) No date: CHF (congestive heart failure) ([place]) 8/11: CVA (cerebral vascular accident) ([place]) No date: Depression No date: Dyslipidemia No date: Generalized anxiety disorder No date: GERD (gastroesophageal reflux disease) No date: History of kidney stones     Comment:  30 years ago No date: Hypothyroidism No date: Macular degeneration No date: Melanoma ([place])     Comment:  resected from scalp approximately 1 year ago.  No date: Osteoarthritis     Comment:  in neck and left knee No date: Tremor     Comment:  noted when writing No date: Valvular heart disease Past Surgical History: 1991: BACK SURGERY     Comment:  L4-5 No date: CATARACT EXTRACTION W/  INTRAOCULAR LENS IMPLANT; Bilateral No date: EYE SURGERY No date: JOINT REPLACEMENT 12/03/2015: KNEE ARTHROPLASTY; Left     Comment:  Procedure: COMPUTER ASSISTED TOTAL KNEE ARTHROPLASTY;                Surgeon: Dereck Leep, MD;  Location: [facility] ORS;                Service: Orthopedics;  Laterality: Left; No date: SKIN CANCER EXCISION     Comment:  melenoma on scalp BMI    Body Mass Index:  26.47 kg/m     Reproductive/Obstetrics                            Anesthesia Physical Anesthesia Plan  ASA: IV  Anesthesia Plan: General ETT   Post-op Pain Management:    Induction: Intravenous  PONV Risk Score and Plan: 2 and Ondansetron and Treatment may vary due to age or medical condition  Airway Management Planned:   Additional Equipment:   Intra-op Plan:   Post-operative Plan: Extubation in OR  Informed Consent: I have reviewed the patients History and Physical, chart, labs and discussed the procedure including the risks, benefits and alternatives for the proposed anesthesia with the patient or authorized representative who has indicated his/her understanding and acceptance.   Dental Advisory Given  Plan Discussed with: CRNA  Anesthesia Plan Comments:        Anesthesia Quick Evaluation

## 2017-10-05 NOTE — Anesthesia Postprocedure Evaluation (Signed)
Anesthesia Post Note  Patient: David Duncan  Procedure(s) Performed: LUMBAR LAMINECTOMY/DECOMPRESSION MICRODISCECTOMY 3 LEVELS L2-5, CORPECTOMY (N/A Back)  Patient location during evaluation: PACU Anesthesia Type: General Level of consciousness: awake and alert Pain management: pain level controlled Vital Signs Assessment: post-procedure vital signs reviewed and stable Respiratory status: spontaneous breathing, nonlabored ventilation, respiratory function stable and patient connected to nasal cannula oxygen Cardiovascular status: blood pressure returned to baseline and stable Postop Assessment: no apparent nausea or vomiting Anesthetic complications: no     Last Vitals:  Vitals:   10/05/17 1248 10/05/17 1345  BP: 136/89 (!) 139/98  Pulse: 77 82  Resp:    Temp: 36.4 C (!) 36.4 C  SpO2: 95% 95%    Last Pain:  Vitals:   10/05/17 1345  TempSrc: Oral  PainSc:                  Alphonsus Sias

## 2017-10-05 NOTE — H&P (Signed)
Heart sounds normal no MRG. Chest Clear to Auscultation Bilaterally.  I have reviewed and confirmed my history and physical from 09/22/2017 with no additions or changes. Plan for L2-5 minimally invasive decompression.  Risks and benefits reviewed.

## 2017-10-05 NOTE — Anesthesia Procedure Notes (Signed)
Procedure Name: Intubation Date/Time: 10/05/2017 7:31 AM Performed by: Loleta Books, RN Pre-anesthesia Checklist: Patient identified, Patient being monitored, Timeout performed, Emergency Drugs available and Suction available Patient Re-evaluated:Patient Re-evaluated prior to induction Oxygen Delivery Method: Circle system utilized Preoxygenation: Pre-oxygenation with 100% oxygen Induction Type: IV induction Ventilation: Mask ventilation without difficulty and Oral airway inserted - appropriate to patient size Laryngoscope Size: Mac and 4 Grade View: Grade I Tube type: Oral Tube size: 7.5 mm Number of attempts: 1 Airway Equipment and Method: Stylet Placement Confirmation: ETT inserted through vocal cords under direct vision,  positive ETCO2 and breath sounds checked- equal and bilateral Secured at: 22 cm Tube secured with: Tape Dental Injury: Teeth and Oropharynx as per pre-operative assessment

## 2017-10-05 NOTE — Op Note (Addendum)
Indications: David Duncan is an 84 yo male who presented with neurogenic claudication.  He failed conservative management and elected for surgical intervention.  Findings: severe lumbar stenosis L2-5  Preoperative Diagnosis: Lumbar Stenosis with neurogenic claudication Postoperative Diagnosis: same   EBL: 100 ml IVF: 1000 ml Drains: none Disposition: Extubated and Stable to PACU Complications: none  No foley catheter was placed.   Preoperative Note:   Risks of surgery discussed include: infection, bleeding, stroke, coma, death, paralysis, CSF leak, nerve/spinal cord injury, numbness, tingling, weakness, complex regional pain syndrome, recurrent stenosis and/or disc herniation, vascular injury, development of instability, neck/back pain, need for further surgery, persistent symptoms, development of deformity, and the risks of anesthesia. The patient understood these risks and agreed to proceed.  Operative Note:   1. L2-5 lumbar decompression including central laminectomy and bilateral medial facetectomies including foraminotomies  The patient was then brought from the preoperative center with intravenous access established.  The patient underwent general anesthesia and endotracheal tube intubation, and was then rotated on the [place] rail top where all pressure points were appropriately padded.  The skin was then thoroughly cleansed.  Perioperative antibiotic prophylaxis was administered.  Sterile prep and drapes were then applied and a timeout was then observed.  C-arm was brought into the field under sterile conditions and under lateral visualization the L2-3, L3-4, and L4-5 interspaces were identified and marked.  The incision was marked on the left and injected with local anesthetic. Once this was complete a 6 cm incision was opened with the use of a #10 blade knife.    We started at L4-5. The metrx tubes were sequentially advanced and confirmed in position. An 53mm by 54mm tube was  locked in place to the bed side attachment.    The microscope was then sterilely brought into the field and muscle creep was hemostased with a bipolar and resected with a pituitary rongeur.  A Bovie extender was then used to expose the spinous process and lamina.  Careful attention was placed to not violate the facet capsule. A 3 mm matchstick drill bit was then used to make a hemi-laminotomy trough until the ligamentum flavum was exposed.  This was extended to the base of the spinous process and to the contralateral side to remove all the central bone from each side.  Once this was complete and the underlying ligamentum flavum was visualized, it was dissected with a curette and resected with Kerrison rongeurs.  Extensive ligamentum hypertrophy was noted, requiring a substantial amount of time and care for removal.  The dura was identified and palpated. The kerrison rongeur was then used to remove the medial facet bilaterally until no compression was noted.  A balltip probe was used to confirm decompression of the left L5 nerve root.  Additional attention was paid to completion of the contralateral L4-5 foraminotomy until the right L5 nerve root was completely free.  Once this was complete, L4-5 central decompression including medial facetectomy and foraminotomy was confirmed and decompression on both sides was confirmed. No CSF leak was noted.  A Depo-Medrol soaked Gelfoam pledget was placed in the defect.  The wound was copiously irrigated. The tube system was then removed under microscopic visualization and hemostasis was obtained with a bipolar.  We then moved to the L3-4 level. The metrx tubes were sequentially advanced and confirmed in position. An 76mm by 52mm tube was locked in place to the bed side attachment.  The microscope was then sterilely brought into the field and muscle creep  was hemostased with a bipolar and resected with a pituitary rongeur.  A Bovie extender was then used to expose the  spinous process and lamina.  Careful attention was placed to not violate the facet capsule. A 3 mm matchstick drill bit was then used to make a hemi-laminotomy trough until the ligamentum flavum was exposed.  This was extended to the base of the spinous process and to the contralateral side to remove all the central bone from each side.  Once this was complete and the underlying ligamentum flavum was visualized, it was dissected with a curette and resected with Kerrison rongeurs.  Extensive ligamentum hypertrophy was noted, requiring a substantial amount of time and care for removal.  The dura was identified and palpated. The kerrison rongeur was then used to remove the medial facet bilaterally until no compression was noted.  A balltip probe was used to confirm decompression of the left L4 nerve root.  Additional attention was paid to completion of the contralateral L3-4 foraminotomy until the right L4 nerve root was completely free.  Once this was complete, L3-4 central decompression including medial facetectomy and foraminotomy was confirmed and decompression on both sides was confirmed. No CSF leak was noted.  A Depo-Medrol soaked Gelfoam pledget was placed in the defect.  The wound was copiously irrigated. The tube system was then removed under microscopic visualization and hemostasis was obtained with a bipolar.  We then moved to L2-3. The metrx tubes were sequentially advanced and confirmed in position. An 21mm by 74mm tube was locked in place to the bed side attachment.  Fluoroscopy was then removed from the field.  The microscope was then sterilely brought into the field and muscle creep was hemostased with a bipolar and resected with a pituitary rongeur.  A Bovie extender was then used to expose the spinous process and lamina.  Careful attention was placed to not violate the facet capsule. A 3 mm matchstick drill bit was then used to make a hemi-laminotomy trough until the ligamentum flavum was  exposed.  This was extended to the base of the spinous process and to the contralateral side to remove all the central bone from each side.  Once this was complete and the underlying ligamentum flavum was visualized, it was dissected with a curette and resected with Kerrison rongeurs.  Extensive ligamentum hypertrophy was noted, requiring a substantial amount of time and care for removal.  The dura was identified and palpated. The kerrison rongeur was then used to remove the medial facet bilaterally until no compression was noted.  A balltip probe was used to confirm decompression of the left L3 nerve root.  Additional attention was paid to completion of the contralateral L2-3 foraminotomy until the right L3 nerve root was completely free.  Once this was complete, L2-3 central decompression including medial facetectomy and foraminotomy was confirmed and decompression on both sides was confirmed. No CSF leak was noted.  A Depo-Medrol soaked Gelfoam pledget was placed in the defect.  The wound was copiously irrigated. The tube system was then removed under microscopic visualization and hemostasis was obtained with a bipolar.  The fascial layer was reapproximated with the use of a 0 Vicryl suture.  Subcutaneous tissue layer was reapproximated using 2-0 Vicryl suture.  3-0 monocryl was placed in subcuticular fashion. The skin was then cleansed and Dermabond was used to close the skin opening.  Patient was then rotated back to the preoperative bed awakened from anesthesia and taken to recovery all counts are correct  in this case.  I performed the entire procedure with the assistance of David Olp PA as an Pensions consultant.  Kathaleen Dudziak K. Izora Ribas MD

## 2017-10-05 NOTE — Anesthesia Post-op Follow-up Note (Signed)
Anesthesia QCDR form completed.        

## 2017-10-05 NOTE — Progress Notes (Addendum)
Procedure: L2-5 lumbar decompression  Procedure date: 10/05/2017 Diagnosis: neurogenic claudication  History: KRU ALLMAN id here for L2/5 lumbar decompression. Tolerated procedure well without complication. Seen in post-op recovery and still disoriented and drowsy. Denies back or leg pain at this time.   Physical Exam: Vitals:   10/05/17 1104 10/05/17 1115  BP: 121/79 123/89  Pulse: 78   Resp:    Temp: (!) 96.8 F (36 C)   SpO2: 98%     AA Ox3 CNI Strength: unable to adequately assess. Patient able to move all extremities (unable to stay awake for exam) Sensation: unable to adequately assess.   Data:  No results for input(s): NA, K, CL, CO2, BUN, CREATININE, LABGLOM, GLUCOSE, CALCIUM in the last 168 hours. No results for input(s): AST, ALT, ALKPHOS in the last 168 hours.  Invalid input(s): TBILI   No results for input(s): WBC, HGB, HCT, PLT in the last 168 hours. Recent Labs  Lab 09/30/17 1422 10/05/17 0650  APTT 38* 32  INR 1.91 1.17          Assessment/Plan:  David Duncan is POD0 s/p L2/5 lumbar decompression. Will observe for adequate pain control.  mobilize pain control VT prophylaxis PTOT  Marin Olp PA-C Department of Neurosurgery

## 2017-10-05 NOTE — Transfer of Care (Signed)
Immediate Anesthesia Transfer of Care Note  Patient: David Duncan  Procedure(s) Performed: LUMBAR LAMINECTOMY/DECOMPRESSION MICRODISCECTOMY 3 LEVELS L2-5, CORPECTOMY (N/A Back)  Patient Location: PACU  Anesthesia Type:General  Level of Consciousness: sedated  Airway & Oxygen Therapy: Patient connected to face mask oxygen  Post-op Assessment: Post -op Vital signs reviewed and stable  Post vital signs: Reviewed and stable  Last Vitals:  Vitals Value Taken Time  BP 121/79 10/05/2017 11:04 AM  Temp    Pulse 78 10/05/2017 11:04 AM  Resp    SpO2 98 % 10/05/2017 11:04 AM    Last Pain:  Vitals:   10/05/17 0613  TempSrc: Tympanic  PainSc: 0-No pain         Complications: No apparent anesthesia complications

## 2017-10-05 NOTE — Progress Notes (Signed)
Pt arrived to room 155 from PACU. Skin assessment completed. Pt on room air. IV infusing. Pt denies pain at this time. TEDs and SCDs applied.

## 2017-10-06 ENCOUNTER — Encounter: Payer: Self-pay | Admitting: Neurosurgery

## 2017-10-06 DIAGNOSIS — M48062 Spinal stenosis, lumbar region with neurogenic claudication: Secondary | ICD-10-CM | POA: Diagnosis not present

## 2017-10-06 MED ORDER — OXYCODONE HCL 5 MG PO TABS: 5.0000 mg | ORAL_TABLET | Freq: Four times a day (QID) | ORAL | 0 refills | Status: DC | PRN

## 2017-10-06 MED ORDER — METHOCARBAMOL 500 MG PO TABS: 500.0000 mg | ORAL_TABLET | Freq: Three times a day (TID) | ORAL | 0 refills | Status: DC | PRN

## 2017-10-06 NOTE — Progress Notes (Signed)
Discharge instructions and prescriptions given to pt. IVs removed. Pt dressed and will be discharged home with wife.

## 2017-10-06 NOTE — Discharge Summary (Signed)
Procedure: L2-5 lumbar decompression  Procedure date: 10/05/2017 Diagnosis: neurogenic claudication  History: David Duncan is  POD1 s/p L2/5 lumbar decompression. He continues to recovery well. He has ambulated with PT and states very minimal leg pain.  Denies back pain, leg numbness/tingling, weakness.  Eaten and voided without issue.  Physical Exam: Vitals:   10/06/17 0822 10/06/17 1050  BP: 119/71 117/64  Pulse: 76 83  Resp: 18   Temp: 98 F (36.7 C)   SpO2: 97%     AA Ox3 CNI Incision: glue compromise at inferior aspect of incision site, new since this morning. Wound cleaned with chloroprep and dressed with steri strips and honeycomb dressing.  No drainage, warmth, tenderness, bleeding.  Strength: 5/5 throughout lower extremities.  Sensation: intact lower extremities.   Data:  No results for input(s): NA, K, CL, CO2, BUN, CREATININE, LABGLOM, GLUCOSE, CALCIUM in the last 168 hours. No results for input(s): AST, ALT, ALKPHOS in the last 168 hours.  Invalid input(s): TBILI   No results for input(s): WBC, HGB, HCT, PLT in the last 168 hours. Recent Labs  Lab 09/30/17 1422 10/05/17 0650  APTT 38* 32  INR 1.91 1.17         Assessment/Plan:  David Duncan is POD1 s/p L2/5 lumbar decompression. Patient is recovering very well from procedure. Pain has been adequately controlled with Tylenol. Will provide robaxin and oxycodone for any breakthrough post op pain or muscle spasms. Advised to remove incision dressing in two days. Advised on wound care and physical activity restrictions. Expect home PT for continued ambulatory improvement.  Patient will follow up in 2 weeks to monitor progress. Appt already scheduled.   Marin Olp PA-C Department of Neurosurgery

## 2017-10-06 NOTE — Evaluation (Signed)
Physical Therapy Evaluation Patient Details Name: David Duncan MRN: [id number] DOB: [birth year] Today's Date: 10/06/2017   History of Present Illness  Patient is an 84 year old male admitted s/p L2-5 laminectomy decompression following c/o neurogenic claudication.  PMH includes OA, tremor, macular degeneration, cardiomyopathy, CHF, atrial fibrillation, anxiety and depression and atrial fibrillation.  Clinical Impression  Patient is an 84 year old male who lives in a one story home with his wife.  He receives assistance with ADL's and ambulated with RW at baseline.  PT provided education concerning bed mobility while maintaining precautions and pt was able to demonstrate proper form.  Pt ambulated 50 ft in room with RW, appearing slightly unsteady on feet but experiencing no LOB's.  Pt presented with overall good strength with the exception of bilateral shoulders due to pain.  He reports all sensation being intact.  Pt able to perform STS from multiple surfaces with min VC's for body mechanics.  PT issued handout to pt and pt expressed and demonstrated understanding of precautions.  Pt will continue to benefit from skilled PT with focus on strength, balance, tolerance to activity, functional mobility and pain management.    Follow Up Recommendations Home health PT    Equipment Recommendations  None recommended by PT    Recommendations for Other Services       Precautions / Restrictions Precautions Precautions: Back Precaution Booklet Issued: No Precaution Comments: Per nursing note, no bending, twisting or arching back.  Pt also advised to avoid lifting objects >10 lbs. Restrictions Weight Bearing Restrictions: No      Mobility  Bed Mobility Overal bed mobility: Needs Assistance Bed Mobility: Supine to Sit;Sit to Supine     Supine to sit: Supervision Sit to supine: Supervision   General bed mobility comments: Provided education concerning maintenance of precautions when  getting in and out of bed.  Transfers Overall transfer level: Needs assistance Equipment used: Rolling walker (2 wheeled) Transfers: Sit to/from Stand Sit to Stand: Min guard         General transfer comment: Pt able to initiate and complete STS without physiscal assist.  Some VC's provided for body mechanics.  Ambulation/Gait Ambulation/Gait assistance: Min guard Gait Distance (Feet): 50 Feet Assistive device: Rolling walker (2 wheeled)     Gait velocity interpretation: 1.31 - 2.62 ft/sec, indicative of limited community ambulator General Gait Details: Moderate foot clearance, slight lateral lean to R which pt reports may be due to previous stroke, no significant LOB's.  Pt does appear unsteady on feet but is able to manage with RW.  Stairs            Wheelchair Mobility    Modified Rankin (Stroke Patients Only)       Balance Overall balance assessment: Modified Independent                                           Pertinent Vitals/Pain Pain Assessment: 0-10 Pain Score: 1  Pain Location: One or less Pain Descriptors / Indicators: Aching Pain Intervention(s): Limited activity within patient's tolerance    Home Living Family/patient expects to be discharged to:: Private residence Living Arrangements: Spouse/significant other Available Help at Discharge: Family;Available PRN/intermittently Type of Home: House Home Access: Stairs to enter Entrance Stairs-Rails: Can reach both Entrance Stairs-Number of Steps: 1 Home Layout: One level Home Equipment: Walker - 2 wheels;Cane - single point;Bedside commode;Grab bars -  tub/shower      Prior Function Level of Independence: Needs assistance   Gait / Transfers Assistance Needed: Uses RW for ambulation  ADL's / Homemaking Assistance Needed: Assistance with bathing and dressing        Hand Dominance        Extremity/Trunk Assessment   Upper Extremity Assessment Upper Extremity  Assessment: Overall WFL for tasks assessed(LUE: grip and elbow flexion/extension: 4/5; unable to fully test shoulder due to RC pain.  RUE: grip: 4/5, elbow flex/ext: 4-/5, unable to fully test shoulders due to RC pain.)    Lower Extremity Assessment Lower Extremity Assessment: Overall WFL for tasks assessed(Grossly 4+/5 bilaterally.)    Cervical / Trunk Assessment Cervical / Trunk Assessment: Kyphotic  Communication   Communication: No difficulties  Cognition Arousal/Alertness: Awake/alert Behavior During Therapy: WFL for tasks assessed/performed Overall Cognitive Status: Within Functional Limits for tasks assessed                                        General Comments      Exercises Other Exercises Other Exercises: Provided education concerning car transfers, inand out of bed and standing and sitting in chair with no arms. x9 min   Assessment/Plan    PT Assessment Patient needs continued PT services  PT Problem List Decreased strength;Decreased mobility;Decreased balance;Decreased knowledge of use of DME;Decreased activity tolerance;Decreased cognition       PT Treatment Interventions DME instruction;Therapeutic activities;Gait training;Therapeutic exercise;Patient/family education;Cognitive remediation;Stair training;Functional mobility training;Neuromuscular re-education;Balance training    PT Goals (Current goals can be found in the Care Plan section)  Acute Rehab PT Goals Patient Stated Goal: To return home and back to generally active lifestyle. PT Goal Formulation: With patient Time For Goal Achievement: 10/20/17 Potential to Achieve Goals: Good    Frequency BID   Barriers to discharge        Co-evaluation               AM-PAC PT "6 Clicks" Daily Activity  Outcome Measure Difficulty turning over in bed (including adjusting bedclothes, sheets and blankets)?: A Little Difficulty moving from lying on back to sitting on the side of the bed?  : A Little Difficulty sitting down on and standing up from a chair with arms (e.g., wheelchair, bedside commode, etc,.)?: A Little Help needed moving to and from a bed to chair (including a wheelchair)?: A Little Help needed walking in hospital room?: A Little Help needed climbing 3-5 steps with a railing? : A Little 6 Click Score: 18    End of Session Equipment Utilized During Treatment: Gait belt Activity Tolerance: Patient tolerated treatment well Patient left: with call bell/phone within reach;with family/visitor present;in bed;with bed alarm set Nurse Communication: Mobility status PT Visit Diagnosis: Muscle weakness (generalized) (M62.81);Unsteadiness on feet (R26.81)    Time: 5956-3875 PT Time Calculation (min) (ACUTE ONLY): 25 min   Charges:   PT Evaluation $PT Eval Low Complexity: 1 Low PT Treatments $Therapeutic Activity: 8-22 mins   PT G Codes:   PT G-Codes **NOT FOR INPATIENT CLASS** Functional Assessment Tool Used: AM-PAC 6 Clicks Basic Mobility    Roxanne Gates, PT, DPT   Roxanne Gates 10/06/2017, 1:45 PM

## 2017-10-06 NOTE — Discharge Instructions (Signed)

## 2017-10-06 NOTE — Care Management (Addendum)
Patient left before RNCM could given observation notice. Spoke with Marin Olp, PA. She states that home health PT has already been set up by Dr. Rhea Bleacher office. She states there are no RNCM needs.

## 2017-10-06 NOTE — Progress Notes (Addendum)
Procedure: L2-5 lumbar decompression  Procedure date: 10/05/2017 Diagnosis: neurogenic claudication  History: STEEL KERNEY is  POD1 s/p L2/5 lumbar decompression. He is doing well. Denies any pain, but has still not been able to ambulate. He attempted to ambulate yesterday evening, but was very dizzy and unsteady on his feet. Uses a walker at home.  Denies back pain, numbness/tingling.  Able to eat and void without issue.   Physical Exam: Vitals:   10/05/17 2005 10/05/17 2322  BP: 110/61 104/66  Pulse: 79 91  Resp: 18 19  Temp: 98.2 F (36.8 C) 97.7 F (36.5 C)  SpO2: 97% 96%    AA Ox3 CNI Strength: 5/5 throughout lower extremities.  Sensation: intact lower extremities.   Data:  No results for input(s): NA, K, CL, CO2, BUN, CREATININE, LABGLOM, GLUCOSE, CALCIUM in the last 168 hours. No results for input(s): AST, ALT, ALKPHOS in the last 168 hours.  Invalid input(s): TBILI   No results for input(s): WBC, HGB, HCT, PLT in the last 168 hours. Recent Labs  Lab 09/30/17 1422 10/05/17 0650  APTT 38* 32  INR 1.91 1.17          Assessment/Plan:  [name] is POD1 s/p L2/5 lumbar decompression. Patient is recovering very well from procedure. Pain adequately controlled with tylenol at this time. Will hold other pain medication as this could be contributing to dizziness. Home PT was arranged prior to procedure. Will get PT evaluation for gait unsteadiness and to assist with ambulation ability.   mobilize pain control DVT prophylaxis  Marin Olp PA-C Department of Neurosurgery

## 2017-10-10 DIAGNOSIS — M48062 Spinal stenosis, lumbar region with neurogenic claudication: Secondary | ICD-10-CM | POA: Diagnosis not present

## 2017-10-10 DIAGNOSIS — Z9181 History of falling: Secondary | ICD-10-CM | POA: Diagnosis not present

## 2017-10-10 DIAGNOSIS — F319 Bipolar disorder, unspecified: Secondary | ICD-10-CM | POA: Diagnosis not present

## 2017-10-10 DIAGNOSIS — Z96642 Presence of left artificial hip joint: Secondary | ICD-10-CM | POA: Diagnosis not present

## 2017-10-10 DIAGNOSIS — M47816 Spondylosis without myelopathy or radiculopathy, lumbar region: Secondary | ICD-10-CM | POA: Diagnosis not present

## 2017-10-10 DIAGNOSIS — Z7901 Long term (current) use of anticoagulants: Secondary | ICD-10-CM | POA: Diagnosis not present

## 2017-10-10 DIAGNOSIS — Z8673 Personal history of transient ischemic attack (TIA), and cerebral infarction without residual deficits: Secondary | ICD-10-CM | POA: Diagnosis not present

## 2017-10-10 DIAGNOSIS — I420 Dilated cardiomyopathy: Secondary | ICD-10-CM | POA: Diagnosis not present

## 2017-10-10 DIAGNOSIS — I4891 Unspecified atrial fibrillation: Secondary | ICD-10-CM | POA: Diagnosis not present

## 2017-10-10 DIAGNOSIS — F411 Generalized anxiety disorder: Secondary | ICD-10-CM | POA: Diagnosis not present

## 2017-10-10 DIAGNOSIS — Z85820 Personal history of malignant melanoma of skin: Secondary | ICD-10-CM | POA: Diagnosis not present

## 2017-10-10 DIAGNOSIS — H3530 Unspecified macular degeneration: Secondary | ICD-10-CM | POA: Diagnosis not present

## 2017-10-15 DIAGNOSIS — Z85820 Personal history of malignant melanoma of skin: Secondary | ICD-10-CM | POA: Diagnosis not present

## 2017-10-15 DIAGNOSIS — I4891 Unspecified atrial fibrillation: Secondary | ICD-10-CM | POA: Diagnosis not present

## 2017-10-15 DIAGNOSIS — H3530 Unspecified macular degeneration: Secondary | ICD-10-CM | POA: Diagnosis not present

## 2017-10-15 DIAGNOSIS — F319 Bipolar disorder, unspecified: Secondary | ICD-10-CM | POA: Diagnosis not present

## 2017-10-15 DIAGNOSIS — M47816 Spondylosis without myelopathy or radiculopathy, lumbar region: Secondary | ICD-10-CM | POA: Diagnosis not present

## 2017-10-15 DIAGNOSIS — Z9181 History of falling: Secondary | ICD-10-CM | POA: Diagnosis not present

## 2017-10-15 DIAGNOSIS — M48062 Spinal stenosis, lumbar region with neurogenic claudication: Secondary | ICD-10-CM | POA: Diagnosis not present

## 2017-10-15 DIAGNOSIS — I420 Dilated cardiomyopathy: Secondary | ICD-10-CM | POA: Diagnosis not present

## 2017-10-15 DIAGNOSIS — Z8673 Personal history of transient ischemic attack (TIA), and cerebral infarction without residual deficits: Secondary | ICD-10-CM | POA: Diagnosis not present

## 2017-10-15 DIAGNOSIS — F411 Generalized anxiety disorder: Secondary | ICD-10-CM | POA: Diagnosis not present

## 2017-10-15 DIAGNOSIS — Z7901 Long term (current) use of anticoagulants: Secondary | ICD-10-CM | POA: Diagnosis not present

## 2017-10-15 DIAGNOSIS — Z96642 Presence of left artificial hip joint: Secondary | ICD-10-CM | POA: Diagnosis not present

## 2017-10-24 DIAGNOSIS — F319 Bipolar disorder, unspecified: Secondary | ICD-10-CM | POA: Diagnosis not present

## 2017-10-24 DIAGNOSIS — H3530 Unspecified macular degeneration: Secondary | ICD-10-CM | POA: Diagnosis not present

## 2017-10-24 DIAGNOSIS — I420 Dilated cardiomyopathy: Secondary | ICD-10-CM | POA: Diagnosis not present

## 2017-10-24 DIAGNOSIS — M48062 Spinal stenosis, lumbar region with neurogenic claudication: Secondary | ICD-10-CM | POA: Diagnosis not present

## 2017-10-24 DIAGNOSIS — I4891 Unspecified atrial fibrillation: Secondary | ICD-10-CM | POA: Diagnosis not present

## 2017-10-24 DIAGNOSIS — Z8673 Personal history of transient ischemic attack (TIA), and cerebral infarction without residual deficits: Secondary | ICD-10-CM | POA: Diagnosis not present

## 2017-10-24 DIAGNOSIS — Z7901 Long term (current) use of anticoagulants: Secondary | ICD-10-CM | POA: Diagnosis not present

## 2017-10-24 DIAGNOSIS — M47816 Spondylosis without myelopathy or radiculopathy, lumbar region: Secondary | ICD-10-CM | POA: Diagnosis not present

## 2017-10-24 DIAGNOSIS — Z85820 Personal history of malignant melanoma of skin: Secondary | ICD-10-CM | POA: Diagnosis not present

## 2017-10-24 DIAGNOSIS — F411 Generalized anxiety disorder: Secondary | ICD-10-CM | POA: Diagnosis not present

## 2017-10-24 DIAGNOSIS — Z9181 History of falling: Secondary | ICD-10-CM | POA: Diagnosis not present

## 2017-10-24 DIAGNOSIS — Z96642 Presence of left artificial hip joint: Secondary | ICD-10-CM | POA: Diagnosis not present

## 2017-10-28 DIAGNOSIS — I4891 Unspecified atrial fibrillation: Secondary | ICD-10-CM | POA: Diagnosis not present

## 2017-10-28 DIAGNOSIS — M47816 Spondylosis without myelopathy or radiculopathy, lumbar region: Secondary | ICD-10-CM | POA: Diagnosis not present

## 2017-10-28 DIAGNOSIS — H3530 Unspecified macular degeneration: Secondary | ICD-10-CM | POA: Diagnosis not present

## 2017-10-28 DIAGNOSIS — I420 Dilated cardiomyopathy: Secondary | ICD-10-CM | POA: Diagnosis not present

## 2017-10-28 DIAGNOSIS — Z8673 Personal history of transient ischemic attack (TIA), and cerebral infarction without residual deficits: Secondary | ICD-10-CM | POA: Diagnosis not present

## 2017-10-28 DIAGNOSIS — Z85820 Personal history of malignant melanoma of skin: Secondary | ICD-10-CM | POA: Diagnosis not present

## 2017-10-28 DIAGNOSIS — F319 Bipolar disorder, unspecified: Secondary | ICD-10-CM | POA: Diagnosis not present

## 2017-10-28 DIAGNOSIS — Z96642 Presence of left artificial hip joint: Secondary | ICD-10-CM | POA: Diagnosis not present

## 2017-10-28 DIAGNOSIS — F411 Generalized anxiety disorder: Secondary | ICD-10-CM | POA: Diagnosis not present

## 2017-10-28 DIAGNOSIS — M48062 Spinal stenosis, lumbar region with neurogenic claudication: Secondary | ICD-10-CM | POA: Diagnosis not present

## 2017-10-28 DIAGNOSIS — Z7901 Long term (current) use of anticoagulants: Secondary | ICD-10-CM | POA: Diagnosis not present

## 2017-10-28 DIAGNOSIS — Z9181 History of falling: Secondary | ICD-10-CM | POA: Diagnosis not present

## 2017-11-02 DIAGNOSIS — Z85820 Personal history of malignant melanoma of skin: Secondary | ICD-10-CM | POA: Diagnosis not present

## 2017-11-02 DIAGNOSIS — Z7901 Long term (current) use of anticoagulants: Secondary | ICD-10-CM | POA: Diagnosis not present

## 2017-11-02 DIAGNOSIS — M47816 Spondylosis without myelopathy or radiculopathy, lumbar region: Secondary | ICD-10-CM | POA: Diagnosis not present

## 2017-11-02 DIAGNOSIS — F319 Bipolar disorder, unspecified: Secondary | ICD-10-CM | POA: Diagnosis not present

## 2017-11-02 DIAGNOSIS — Z96642 Presence of left artificial hip joint: Secondary | ICD-10-CM | POA: Diagnosis not present

## 2017-11-02 DIAGNOSIS — I4891 Unspecified atrial fibrillation: Secondary | ICD-10-CM | POA: Diagnosis not present

## 2017-11-02 DIAGNOSIS — Z9181 History of falling: Secondary | ICD-10-CM | POA: Diagnosis not present

## 2017-11-02 DIAGNOSIS — M48062 Spinal stenosis, lumbar region with neurogenic claudication: Secondary | ICD-10-CM | POA: Diagnosis not present

## 2017-11-02 DIAGNOSIS — Z8673 Personal history of transient ischemic attack (TIA), and cerebral infarction without residual deficits: Secondary | ICD-10-CM | POA: Diagnosis not present

## 2017-11-02 DIAGNOSIS — F411 Generalized anxiety disorder: Secondary | ICD-10-CM | POA: Diagnosis not present

## 2017-11-02 DIAGNOSIS — I420 Dilated cardiomyopathy: Secondary | ICD-10-CM | POA: Diagnosis not present

## 2017-11-02 DIAGNOSIS — H3530 Unspecified macular degeneration: Secondary | ICD-10-CM | POA: Diagnosis not present

## 2017-11-14 DIAGNOSIS — F411 Generalized anxiety disorder: Secondary | ICD-10-CM | POA: Diagnosis not present

## 2017-11-14 DIAGNOSIS — M47816 Spondylosis without myelopathy or radiculopathy, lumbar region: Secondary | ICD-10-CM | POA: Diagnosis not present

## 2017-11-14 DIAGNOSIS — Z96642 Presence of left artificial hip joint: Secondary | ICD-10-CM | POA: Diagnosis not present

## 2017-11-14 DIAGNOSIS — Z9181 History of falling: Secondary | ICD-10-CM | POA: Diagnosis not present

## 2017-11-14 DIAGNOSIS — M48062 Spinal stenosis, lumbar region with neurogenic claudication: Secondary | ICD-10-CM | POA: Diagnosis not present

## 2017-11-14 DIAGNOSIS — Z7901 Long term (current) use of anticoagulants: Secondary | ICD-10-CM | POA: Diagnosis not present

## 2017-11-14 DIAGNOSIS — I420 Dilated cardiomyopathy: Secondary | ICD-10-CM | POA: Diagnosis not present

## 2017-11-14 DIAGNOSIS — Z8673 Personal history of transient ischemic attack (TIA), and cerebral infarction without residual deficits: Secondary | ICD-10-CM | POA: Diagnosis not present

## 2017-11-14 DIAGNOSIS — F319 Bipolar disorder, unspecified: Secondary | ICD-10-CM | POA: Diagnosis not present

## 2017-11-14 DIAGNOSIS — I4891 Unspecified atrial fibrillation: Secondary | ICD-10-CM | POA: Diagnosis not present

## 2017-11-14 DIAGNOSIS — H3530 Unspecified macular degeneration: Secondary | ICD-10-CM | POA: Diagnosis not present

## 2017-11-14 DIAGNOSIS — Z85820 Personal history of malignant melanoma of skin: Secondary | ICD-10-CM | POA: Diagnosis not present

## 2017-11-18 DIAGNOSIS — R000 Tachycardia, unspecified: Secondary | ICD-10-CM | POA: Diagnosis not present

## 2017-11-18 DIAGNOSIS — R110 Nausea: Secondary | ICD-10-CM | POA: Diagnosis not present

## 2017-11-18 DIAGNOSIS — I959 Hypotension, unspecified: Secondary | ICD-10-CM | POA: Diagnosis not present

## 2017-11-18 DIAGNOSIS — I4891 Unspecified atrial fibrillation: Secondary | ICD-10-CM | POA: Diagnosis not present

## 2017-11-19 ENCOUNTER — Other Ambulatory Visit: Payer: Self-pay

## 2017-11-19 ENCOUNTER — Emergency Department: Payer: PPO

## 2017-11-19 ENCOUNTER — Observation Stay
Admission: EM | Admit: 2017-11-19 | Discharge: 2017-11-22 | Disposition: A | Discharge status: Skilled Nursing Facility | Payer: PPO | Attending: Internal Medicine | Admitting: Internal Medicine

## 2017-11-19 ENCOUNTER — Observation Stay: Payer: PPO

## 2017-11-19 DIAGNOSIS — I5022 Chronic systolic (congestive) heart failure: Secondary | ICD-10-CM | POA: Diagnosis not present

## 2017-11-19 DIAGNOSIS — M1990 Unspecified osteoarthritis, unspecified site: Secondary | ICD-10-CM | POA: Insufficient documentation

## 2017-11-19 DIAGNOSIS — K5900 Constipation, unspecified: Secondary | ICD-10-CM | POA: Insufficient documentation

## 2017-11-19 DIAGNOSIS — N179 Acute kidney failure, unspecified: Secondary | ICD-10-CM | POA: Diagnosis not present

## 2017-11-19 DIAGNOSIS — F319 Bipolar disorder, unspecified: Secondary | ICD-10-CM | POA: Insufficient documentation

## 2017-11-19 DIAGNOSIS — I7389 Other specified peripheral vascular diseases: Secondary | ICD-10-CM | POA: Insufficient documentation

## 2017-11-19 DIAGNOSIS — M48062 Spinal stenosis, lumbar region with neurogenic claudication: Secondary | ICD-10-CM | POA: Insufficient documentation

## 2017-11-19 DIAGNOSIS — R1110 Vomiting, unspecified: Secondary | ICD-10-CM | POA: Diagnosis not present

## 2017-11-19 DIAGNOSIS — R42 Dizziness and giddiness: Secondary | ICD-10-CM

## 2017-11-19 DIAGNOSIS — I420 Dilated cardiomyopathy: Secondary | ICD-10-CM | POA: Diagnosis not present

## 2017-11-19 DIAGNOSIS — R410 Disorientation, unspecified: Secondary | ICD-10-CM | POA: Diagnosis present

## 2017-11-19 DIAGNOSIS — E875 Hyperkalemia: Secondary | ICD-10-CM | POA: Insufficient documentation

## 2017-11-19 DIAGNOSIS — I4891 Unspecified atrial fibrillation: Secondary | ICD-10-CM | POA: Diagnosis not present

## 2017-11-19 DIAGNOSIS — E785 Hyperlipidemia, unspecified: Secondary | ICD-10-CM | POA: Insufficient documentation

## 2017-11-19 DIAGNOSIS — M4802 Spinal stenosis, cervical region: Secondary | ICD-10-CM | POA: Insufficient documentation

## 2017-11-19 DIAGNOSIS — Z8673 Personal history of transient ischemic attack (TIA), and cerebral infarction without residual deficits: Secondary | ICD-10-CM | POA: Insufficient documentation

## 2017-11-19 DIAGNOSIS — Z8249 Family history of ischemic heart disease and other diseases of the circulatory system: Secondary | ICD-10-CM | POA: Insufficient documentation

## 2017-11-19 DIAGNOSIS — I130 Hypertensive heart and chronic kidney disease with heart failure and stage 1 through stage 4 chronic kidney disease, or unspecified chronic kidney disease: Secondary | ICD-10-CM | POA: Insufficient documentation

## 2017-11-19 DIAGNOSIS — F419 Anxiety disorder, unspecified: Secondary | ICD-10-CM | POA: Insufficient documentation

## 2017-11-19 DIAGNOSIS — Z8042 Family history of malignant neoplasm of prostate: Secondary | ICD-10-CM | POA: Diagnosis not present

## 2017-11-19 DIAGNOSIS — N183 Chronic kidney disease, stage 3 (moderate): Secondary | ICD-10-CM | POA: Diagnosis not present

## 2017-11-19 DIAGNOSIS — I482 Chronic atrial fibrillation: Secondary | ICD-10-CM | POA: Diagnosis not present

## 2017-11-19 DIAGNOSIS — N401 Enlarged prostate with lower urinary tract symptoms: Secondary | ICD-10-CM | POA: Insufficient documentation

## 2017-11-19 DIAGNOSIS — K219 Gastro-esophageal reflux disease without esophagitis: Secondary | ICD-10-CM | POA: Diagnosis not present

## 2017-11-19 DIAGNOSIS — R112 Nausea with vomiting, unspecified: Secondary | ICD-10-CM

## 2017-11-19 DIAGNOSIS — Z79899 Other long term (current) drug therapy: Secondary | ICD-10-CM | POA: Insufficient documentation

## 2017-11-19 DIAGNOSIS — H3530 Unspecified macular degeneration: Secondary | ICD-10-CM | POA: Diagnosis not present

## 2017-11-19 DIAGNOSIS — Z96652 Presence of left artificial knee joint: Secondary | ICD-10-CM | POA: Insufficient documentation

## 2017-11-19 DIAGNOSIS — R251 Tremor, unspecified: Secondary | ICD-10-CM | POA: Insufficient documentation

## 2017-11-19 DIAGNOSIS — R2681 Unsteadiness on feet: Secondary | ICD-10-CM | POA: Insufficient documentation

## 2017-11-19 DIAGNOSIS — T428X5A Adverse effect of antiparkinsonism drugs and other central muscle-tone depressants, initial encounter: Secondary | ICD-10-CM | POA: Insufficient documentation

## 2017-11-19 DIAGNOSIS — I4892 Unspecified atrial flutter: Secondary | ICD-10-CM | POA: Insufficient documentation

## 2017-11-19 DIAGNOSIS — G92 Toxic encephalopathy: Secondary | ICD-10-CM | POA: Diagnosis not present

## 2017-11-19 DIAGNOSIS — I081 Rheumatic disorders of both mitral and tricuspid valves: Secondary | ICD-10-CM | POA: Insufficient documentation

## 2017-11-19 DIAGNOSIS — Z85820 Personal history of malignant melanoma of skin: Secondary | ICD-10-CM | POA: Diagnosis not present

## 2017-11-19 DIAGNOSIS — H9190 Unspecified hearing loss, unspecified ear: Secondary | ICD-10-CM | POA: Insufficient documentation

## 2017-11-19 DIAGNOSIS — Z87442 Personal history of urinary calculi: Secondary | ICD-10-CM | POA: Diagnosis not present

## 2017-11-19 DIAGNOSIS — Z823 Family history of stroke: Secondary | ICD-10-CM | POA: Insufficient documentation

## 2017-11-19 DIAGNOSIS — E039 Hypothyroidism, unspecified: Secondary | ICD-10-CM | POA: Diagnosis not present

## 2017-11-19 DIAGNOSIS — N281 Cyst of kidney, acquired: Secondary | ICD-10-CM | POA: Insufficient documentation

## 2017-11-19 DIAGNOSIS — Z9841 Cataract extraction status, right eye: Secondary | ICD-10-CM | POA: Insufficient documentation

## 2017-11-19 DIAGNOSIS — I493 Ventricular premature depolarization: Secondary | ICD-10-CM | POA: Insufficient documentation

## 2017-11-19 DIAGNOSIS — H8149 Vertigo of central origin, unspecified ear: Secondary | ICD-10-CM | POA: Diagnosis not present

## 2017-11-19 DIAGNOSIS — Z9842 Cataract extraction status, left eye: Secondary | ICD-10-CM | POA: Insufficient documentation

## 2017-11-19 DIAGNOSIS — Z7901 Long term (current) use of anticoagulants: Secondary | ICD-10-CM | POA: Insufficient documentation

## 2017-11-19 LAB — CBC WITH DIFFERENTIAL/PLATELET
Basophils Absolute: 0 10*3/uL (ref 0–0.1)
Basophils Relative: 1 %
EOS PCT: 1 %
Eosinophils Absolute: 0.1 10*3/uL (ref 0–0.7)
HCT: 40.6 % (ref 40.0–52.0)
Hemoglobin: 13.9 g/dL (ref 13.0–18.0)
LYMPHS ABS: 1.7 10*3/uL (ref 1.0–3.6)
LYMPHS PCT: 20 %
MCH: 34.4 pg — AB (ref 26.0–34.0)
MCHC: 34.2 g/dL (ref 32.0–36.0)
MCV: 100.7 fL — ABNORMAL HIGH (ref 80.0–100.0)
MONOS PCT: 6 %
Monocytes Absolute: 0.5 10*3/uL (ref 0.2–1.0)
Neutro Abs: 6.1 10*3/uL (ref 1.4–6.5)
Neutrophils Relative %: 72 %
PLATELETS: 143 10*3/uL — AB (ref 150–440)
RBC: 4.03 MIL/uL — AB (ref 4.40–5.90)
RDW: 14.8 % — ABNORMAL HIGH (ref 11.5–14.5)
WBC: 8.4 10*3/uL (ref 3.8–10.6)

## 2017-11-19 LAB — URINALYSIS, COMPLETE (UACMP) WITH MICROSCOPIC
BILIRUBIN URINE: NEGATIVE
Bacteria, UA: NONE SEEN
GLUCOSE, UA: NEGATIVE mg/dL
HGB URINE DIPSTICK: NEGATIVE
KETONES UR: NEGATIVE mg/dL
LEUKOCYTES UA: NEGATIVE
NITRITE: NEGATIVE
PH: 5 (ref 5.0–8.0)
Protein, ur: 30 mg/dL — AB
SPECIFIC GRAVITY, URINE: 1.014 (ref 1.005–1.030)
Squamous Epithelial / LPF: NONE SEEN (ref 0–5)

## 2017-11-19 LAB — TSH: TSH: 1.565 u[IU]/mL (ref 0.350–4.500)

## 2017-11-19 LAB — COMPREHENSIVE METABOLIC PANEL
ALK PHOS: 69 U/L (ref 38–126)
ALT: 21 U/L (ref 0–44)
ANION GAP: 9 (ref 5–15)
AST: 29 U/L (ref 15–41)
Albumin: 3.6 g/dL (ref 3.5–5.0)
BUN: 35 mg/dL — ABNORMAL HIGH (ref 8–23)
CALCIUM: 8.7 mg/dL — AB (ref 8.9–10.3)
CO2: 21 mmol/L — ABNORMAL LOW (ref 22–32)
Chloride: 108 mmol/L (ref 98–111)
Creatinine, Ser: 1.64 mg/dL — ABNORMAL HIGH (ref 0.61–1.24)
GFR calc Af Amer: 42 mL/min — ABNORMAL LOW (ref >60)
GFR, EST NON AFRICAN AMERICAN: 37 mL/min — AB (ref >60)
GLUCOSE: 138 mg/dL — AB (ref 70–99)
POTASSIUM: 4.4 mmol/L (ref 3.5–5.1)
Sodium: 138 mmol/L (ref 135–145)
TOTAL PROTEIN: 6.8 g/dL (ref 6.5–8.1)
Total Bilirubin: 1 mg/dL (ref 0.3–1.2)

## 2017-11-19 LAB — LIPASE, BLOOD: LIPASE: 46 U/L (ref 11–51)

## 2017-11-19 LAB — PROTIME-INR
INR: 2.44
Prothrombin Time: 26.3 seconds — ABNORMAL HIGH (ref 11.4–15.2)

## 2017-11-19 LAB — LACTIC ACID, PLASMA: Lactic Acid, Venous: 1.5 mmol/L (ref 0.5–1.9)

## 2017-11-19 LAB — TROPONIN I: Troponin I: <0.03 ng/mL (ref <0.03)

## 2017-11-19 MED ORDER — ONDANSETRON HCL 4 MG/2ML IJ SOLN: 4.0000 mg | Freq: Four times a day (QID) | INTRAMUSCULAR | Status: DC | PRN

## 2017-11-19 MED ORDER — ACETAMINOPHEN 650 MG RE SUPP: 650.0000 mg | Freq: Four times a day (QID) | RECTAL | Status: DC | PRN

## 2017-11-19 MED ORDER — ONDANSETRON 4 MG PO TBDP: 4.0000 mg | ORAL_TABLET | Freq: Three times a day (TID) | ORAL | 0 refills | Status: DC | PRN

## 2017-11-19 MED ORDER — HEPARIN SODIUM (PORCINE) 5000 UNIT/ML IJ SOLN
5000.0000 [IU] | Freq: Three times a day (TID) | INTRAMUSCULAR | Status: DC
  Filled 2017-11-19: qty 1

## 2017-11-19 MED ORDER — WARFARIN - PHARMACIST DOSING INPATIENT
Freq: Every day | Status: DC
  Administered 2017-11-19 – 2017-11-20 (×2)

## 2017-11-19 MED ORDER — HALOPERIDOL LACTATE 5 MG/ML IJ SOLN
5.0000 mg | Freq: Four times a day (QID) | INTRAMUSCULAR | Status: DC | PRN
Start: 2017-11-19 — End: 2017-11-22
  Administered 2017-11-19 – 2017-11-20 (×2): 5 mg via INTRAMUSCULAR
  Filled 2017-11-19 (×2): qty 1

## 2017-11-19 MED ORDER — ONDANSETRON HCL 4 MG PO TABS: 4.0000 mg | ORAL_TABLET | Freq: Four times a day (QID) | ORAL | Status: DC | PRN

## 2017-11-19 MED ORDER — METOPROLOL SUCCINATE ER 50 MG PO TB24
100.0000 mg | ORAL_TABLET | Freq: Every day | ORAL | Status: DC
  Administered 2017-11-21 – 2017-11-22 (×2): 100 mg via ORAL
  Filled 2017-11-19 (×2): qty 2

## 2017-11-19 MED ORDER — QUETIAPINE FUMARATE 25 MG PO TABS
25.0000 mg | ORAL_TABLET | Freq: Every evening | ORAL | Status: DC | PRN
  Administered 2017-11-20: 20:00:00 25 mg via ORAL
  Filled 2017-11-19: qty 1

## 2017-11-19 MED ORDER — SACUBITRIL-VALSARTAN 24-26 MG PO TABS
1.0000 | ORAL_TABLET | Freq: Two times a day (BID) | ORAL | Status: DC
  Administered 2017-11-19: 21:00:00 1 via ORAL
  Filled 2017-11-19 (×5): qty 1

## 2017-11-19 MED ORDER — ACETAMINOPHEN 325 MG PO TABS
650.0000 mg | ORAL_TABLET | Freq: Four times a day (QID) | ORAL | Status: DC | PRN
  Administered 2017-11-20: 650 mg via ORAL
  Filled 2017-11-19: qty 2

## 2017-11-19 MED ORDER — WARFARIN SODIUM 3 MG PO TABS
3.0000 mg | ORAL_TABLET | Freq: Every day | ORAL | Status: DC
  Administered 2017-11-19 – 2017-11-21 (×3): 3 mg via ORAL
  Filled 2017-11-19 (×4): qty 1

## 2017-11-19 MED ORDER — DOCUSATE SODIUM 100 MG PO CAPS
100.0000 mg | ORAL_CAPSULE | Freq: Two times a day (BID) | ORAL | Status: DC
  Administered 2017-11-19 – 2017-11-22 (×5): 100 mg via ORAL
  Filled 2017-11-19 (×5): qty 1

## 2017-11-19 MED ORDER — ASPIRIN 81 MG PO CHEW
324.0000 mg | CHEWABLE_TABLET | Freq: Once | ORAL | Status: AC
  Administered 2017-11-19: 324 mg via ORAL
  Filled 2017-11-19: qty 4

## 2017-11-19 MED ORDER — BACLOFEN 10 MG PO TABS
10.0000 mg | ORAL_TABLET | Freq: Three times a day (TID) | ORAL | Status: DC | PRN
  Filled 2017-11-19: qty 1

## 2017-11-19 NOTE — Progress Notes (Signed)
Pt was yelling out for someone to come to his room. He became impulsive, trying to get out of bed stating that he needs to find his shoes. This nurse tried to explain to the pt that his shoes were at bedside and that they were fine. This nurse tried to talk pt into staying in bed. Pt began to cuss at this nurse, and hit the bedside table. Pt continued to try to climb out of bed. MD made aware. Q6H PRN Haldol prescribed.

## 2017-11-19 NOTE — ED Notes (Signed)
Report from hunter, rn.  

## 2017-11-19 NOTE — ED Triage Notes (Signed)
Pt presents via EMS from home c/o N/V since apx 2200 per EMS report. EMS report pt BP 88 systolic upon arrival to house. Given 4mg  IV zofran and 800cc NS per EMS report.

## 2017-11-19 NOTE — ED Notes (Signed)
Pt's spouse very concerned regarding wait time for admission. Spouse declines to have blood pressure cuff replaced on pt's arm. Explanation of admission process provided to spouse who continues to voice concerns over non immediate move to inpatient room.

## 2017-11-19 NOTE — ED Notes (Signed)
ED Provider at bedside. 

## 2017-11-19 NOTE — H&P (Signed)
David Duncan is an 84 y.o. male.    Chief Complaint: Vomiting HPI: Patient with past medical history of CHF, hypothyroidism, atrial fibrillation and history of stroke presents to the emergency department with emesis.  He had at least 2 episodes of nonbloody nonbilious emesis.  The patient's wife states that he has been confused and not acting like himself since approximately 4 hours prior to admission.  Head CT was normal as was urinalysis and most of his electrolytes.  The patient had no further episodes of vomiting but continued to be confused which prompted emergency department staff to call the hospitalist service for admission.  Past Medical History:  Diagnosis Date  . Anxiety   . Arrhythmia   . Atrial fibrillation ([place])   . Basal cell cancer 2008   on shoulder  . Bipolar disorder [facility])    hospitalized in past  . Cardiomyopathy ([place])   . CHF (congestive heart failure) ([place])   . CVA (cerebral vascular accident) ([place]) 8/11  . Depression   . Dyslipidemia   . Generalized anxiety disorder   . GERD (gastroesophageal reflux disease)   . History of kidney stones    30 years ago  . Hypothyroidism   . Macular degeneration   . Melanoma ([place])    resected from scalp approximately 1 year ago.   . Osteoarthritis    in neck and left knee  . Tremor    noted when writing  . Valvular heart disease     Past Surgical History:  Procedure Laterality Date  . BACK SURGERY  1991   L4-5  . CATARACT EXTRACTION W/ INTRAOCULAR LENS IMPLANT Bilateral   . EYE SURGERY    . JOINT REPLACEMENT    . KNEE ARTHROPLASTY Left 12/03/2015   Procedure: COMPUTER ASSISTED TOTAL KNEE ARTHROPLASTY;  Surgeon: Dereck Leep, MD;  Location: [facility] ORS;  Service: Orthopedics;  Laterality: Left;  . LUMBAR LAMINECTOMY/DECOMPRESSION MICRODISCECTOMY N/A 10/05/2017   Procedure: LUMBAR LAMINECTOMY/DECOMPRESSION MICRODISCECTOMY 3 LEVELS L2-5, CORPECTOMY;  Surgeon: Meade Maw, MD;  Location: [facility] ORS;  Service:  Neurosurgery;  Laterality: N/A;  . SKIN CANCER EXCISION     melenoma on scalp    Family History  Problem Relation Age of Onset  . Stroke Father   . Heart attack Father   . Cancer Brother        prostate  . Coronary artery disease Brother   . Hypertension Brother    Social History:  reports that he has never smoked. He has never used smokeless tobacco. He reports that he does not drink alcohol or use drugs.  Allergies: No Known Allergies  Medications Prior to Admission  Medication Sig Dispense Refill  . Acetaminophen 500 MG coapsule Take 2 capsules by mouth every 6 (six) hours as needed for pain.     . Amino Acids (FREE FORM AMINO ACID COMPLEX PO) Take 1 capsule by mouth 2 (two) times daily.     . Ascorbic Acid (VITAMIN C) 500 MG tablet Take 500 mg by mouth 2 (two) times daily.     . B Complex Vitamins (B COMPLEX PO) Take 1 tablet by mouth 2 (two) times daily.     . baclofen (LIORESAL) 10 MG tablet Take 10 mg by mouth 3 (three) times daily as needed for muscle spasms.   0  . Calcium-Magnesium-Vitamin D (CALCIUM 500 PO) Take 2 tablets by mouth daily.    . Cyanocobalamin (VITAMIN B-12) 1000 MCG SUBL Place 1 tablet under the tongue daily.     [name] Kitchen  diclofenac sodium (VOLTAREN) 1 % GEL Apply 2 g topically 3 (three) times daily as needed (pain).     [name] Kitchen escitalopram (LEXAPRO) 20 MG tablet Take 1 tablet (20 mg total) by mouth every morning. (Patient taking differently: Take 10 mg by mouth every morning. ) 90 tablet 1  . furosemide (LASIX) 20 MG tablet Take 20 mg by mouth daily as needed for fluid.     [name] Kitchen L-Methylfolate-Algae (DEPLIN 15) 15-90.314 MG CAPS Take 15 mg by mouth every morning. 28 capsule 0  . loperamide (IMODIUM) 2 MG capsule Take 2 mg by mouth as needed for diarrhea or loose stools.    [name] Kitchen MELATONIN PO Take 2 tablets by mouth at bedtime. Restful Sleep by [facility] : Hops Extract 4:1 (Humulus lupulus) (flower) Chamomile (Matricaria chamomilla) (flower) Passion Flower Extract (standardized to  4% vitexin) (Passiflora incarnate) (flower) 5 HTP (5-Hydroxtryptophan) (Griffonia simplicifolia) (seed)    . metoprolol succinate (TOPROL-XL) 100 MG 24 hr tablet Take 1 tablet (100 mg total) by mouth daily. Take with or immediately following a meal. 90 tablet 3  . Multiple Vitamin (MULTIVITAMIN) capsule Take 1 capsule by mouth daily.    [name] Kitchen oxyCODONE (ROXICODONE) 5 MG immediate release tablet Take 1 tablet (5 mg total) by mouth every 6 (six) hours as needed for breakthrough pain. 20 tablet 0  . sacubitril-valsartan (ENTRESTO) 24-26 MG Take 1 tablet by mouth 2 (two) times daily. 60 tablet 3  . Specialty Vitamins Products (ICAPS LUTEIN-ZEAXANTHIN PO) Take 2 capsules by mouth daily.    [name] Kitchen Specialty Vitamins Products (PROSTATE PO) Take 1 tablet by mouth daily.     [name] Kitchen warfarin (COUMADIN) 5 MG tablet Take 2.5-5 mg by mouth See admin instructions. Takes 60m every 5 days. 2.5 mg every other day.  1    Results for orders placed or performed during the hospital encounter of 11/19/17 (from the past 48 hour(s))  CBC with Differential     Status: Abnormal   Collection Time: 11/19/17 12:34 AM  Result Value Ref Range   WBC 8.4 3.8 - 10.6 K/uL   RBC 4.03 (L) 4.40 - 5.90 MIL/uL   Hemoglobin 13.9 13.0 - 18.0 g/dL   HCT 40.6 40.0 - 52.0 %   MCV 100.7 (H) 80.0 - 100.0 fL   MCH 34.4 (H) 26.0 - 34.0 pg   MCHC 34.2 32.0 - 36.0 g/dL   RDW 14.8 (H) 11.5 - 14.5 %   Platelets 143 (L) 150 - 440 K/uL   Neutrophils Relative % 72 %   Neutro Abs 6.1 1.4 - 6.5 K/uL   Lymphocytes Relative 20 %   Lymphs Abs 1.7 1.0 - 3.6 K/uL   Monocytes Relative 6 %   Monocytes Absolute 0.5 0.2 - 1.0 K/uL   Eosinophils Relative 1 %   Eosinophils Absolute 0.1 0 - 0.7 K/uL   Basophils Relative 1 %   Basophils Absolute 0.0 0 - 0.1 K/uL    Comment: [facility]formed at [facility] [address] [address] Lactic acid, plasma     Status: None   Collection Time: 11/19/17 12:35 AM  Result Value Ref Range   Lactic  Acid, Venous 1.5 0.5 - 1.9 mmol/L    Comment: Performed at [facility] [address], [place] [address] Comprehensive metabolic panel     Status: Abnormal   Collection Time: 11/19/17  1:04 AM  Result Value Ref Range   Sodium 138 135 - 145 mmol/L   Potassium 4.4 3.5 -  5.1 mmol/L   Chloride 108 98 - 111 mmol/L   CO2 21 (L) 22 - 32 mmol/L   Glucose, Bld 138 (H) 70 - 99 mg/dL   BUN 35 (H) 8 - 23 mg/dL   Creatinine, Ser 1.64 (H) 0.61 - 1.24 mg/dL   Calcium 8.7 (L) 8.9 - 10.3 mg/dL   Total Protein 6.8 6.5 - 8.1 g/dL   Albumin 3.6 3.5 - 5.0 g/dL   AST 29 15 - 41 U/L   ALT 21 0 - 44 U/L   Alkaline Phosphatase 69 38 - 126 U/L   Total Bilirubin 1.0 0.3 - 1.2 mg/dL   GFR calc non Af Amer 37 (L) >60 mL/min   GFR calc Af Amer 42 (L) >60 mL/min    Comment: (NOTE) The eGFR has been calculated using the CKD EPI equation. This calculation has not been validated in all clinical situations. eGFR's persistently <60 mL/min signify possible Chronic Kidney Disease.    Anion gap 9 5 - 15    Comment: Performed at [facility], [address] [place], [place] [address]  Lipase, blood     Status: None   Collection Time: 11/19/17  1:04 AM  Result Value Ref Range   Lipase 46 11 - 51 U/L    Comment: Performed at [facility], [address] [place], [place] [address]  Troponin I     Status: None   Collection Time: 11/19/17  1:04 AM  Result Value Ref Range   Troponin I <0.03 <0.03 ng/mL    Comment: Performed at [facility], [address] [place], [place] [address]  Urinalysis, Complete w Microscopic     Status: Abnormal   Collection Time: 11/19/17  2:38 AM  Result Value Ref Range   Color, Urine YELLOW (A) YELLOW   APPearance CLEAR (A) CLEAR   Specific Gravity, Urine 1.014 1.005 - 1.030   pH 5.0 5.0 - 8.0   Glucose, UA NEGATIVE NEGATIVE mg/dL   Hgb urine dipstick NEGATIVE NEGATIVE   Bilirubin Urine NEGATIVE NEGATIVE   Ketones, ur NEGATIVE  NEGATIVE mg/dL   Protein, ur 30 (A) NEGATIVE mg/dL   Nitrite NEGATIVE NEGATIVE   Leukocytes, UA NEGATIVE NEGATIVE   RBC / HPF 0-5 0 - 5 RBC/hpf   WBC, UA 0-5 0 - 5 WBC/hpf   Bacteria, UA NONE SEEN NONE SEEN   Squamous Epithelial / LPF NONE SEEN 0 - 5    Comment: Performed at [facility], [address] [place], [place] [address]   Ct Head Wo Contrast  Result Date: 11/19/2017 CLINICAL DATA:  Acute onset of vertigo, nausea and vomiting. Hypotension. EXAM: CT HEAD WITHOUT CONTRAST TECHNIQUE: Contiguous axial images were obtained from the base of the skull through the vertex without intravenous contrast. COMPARISON:  CT of the head performed 04/26/2017, and MRI of the brain performed 11/12/2009 FINDINGS: Brain: No evidence of acute infarction, hemorrhage, hydrocephalus, extra-axial collection or mass lesion / mass effect. Prominence of the ventricles and sulci reflects moderate cortical volume loss. Cerebellar atrophy is noted. Scattered periventricular and subcortical white matter change likely reflects small vessel ischemic microangiopathy. A chronic infarct is noted at the left external capsule. The brainstem and fourth ventricle are within normal limits. The cerebral hemispheres demonstrate grossly normal gray-white differentiation. No mass effect or midline shift is seen. Vascular: No hyperdense vessel or unexpected calcification. Skull: There is no evidence of fracture; visualized osseous structures are unremarkable in appearance. Sinuses/Orbits: The visualized portions of the orbits  are within normal limits. The paranasal sinuses and mastoid air cells are well-aerated. Other: No significant soft tissue abnormalities are seen. IMPRESSION: 1. No acute intracranial pathology seen on CT. 2. Moderate cortical volume loss and scattered small vessel ischemic microangiopathy. 3. 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 Electronically Signed   By: Garald Balding M.D.   On: 11/19/2017 01:06    Mr Brain Wo Contrast (neuro Protocol)  Result Date: 11/19/2017 CLINICAL DATA:  Persistent central vertigo. EXAM: MRI HEAD WITHOUT CONTRAST TECHNIQUE: Multiplanar, multiecho pulse sequences of the brain and surrounding structures were obtained without intravenous contrast. COMPARISON:  Head CT 11/19/2017 Brain MRI 11/04/2014 FINDINGS: BRAIN: There is no acute infarct, acute hemorrhage or mass effect. The midline structures are normal. Old left external capsule infarct. Early confluent hyperintense T2-weighted signal of the periventricular and deep white matter, most commonly due to chronic ischemic microangiopathy. Generalized atrophy without lobar predilection. Susceptibility-sensitive sequences show no chronic microhemorrhage or superficial siderosis. VASCULAR: Major intracranial arterial and venous sinus flow voids are preserved. SKULL AND UPPER CERVICAL SPINE: The visualized skull base, calvarium, upper cervical spine and extracranial soft tissues are normal. SINUSES/ORBITS: No fluid levels or advanced mucosal thickening. No mastoid or middle ear effusion. The orbits are normal. IMPRESSION: 1. No acute intracranial abnormality. 2. Old left external capsule small vessel infarct and sequelae of chronic microvascular ischemia. 3. Generalized atrophy. Electronically Signed   By: Ulyses Jarred M.D.   On: 11/19/2017 01:54    Review of Systems  Constitutional: Negative for chills and fever.  HENT: Negative for sore throat and tinnitus.   Eyes: Negative for blurred vision and redness.  Respiratory: Negative for cough and shortness of breath.   Cardiovascular: Negative for chest pain, palpitations, orthopnea and PND.  Gastrointestinal: Negative for abdominal pain, diarrhea, nausea and vomiting.  Genitourinary: Negative for dysuria, frequency and urgency.  Musculoskeletal: Negative for joint pain and myalgias.  Skin: Negative for rash.       No lesions  Neurological: Negative for speech change, focal  weakness and weakness.  Endo/Heme/Allergies: Does not bruise/bleed easily.       No temperature intolerance  Psychiatric/Behavioral: Negative for depression and suicidal ideas.    Blood pressure 101/73, pulse 80, temperature 98.2 F (36.8 C), temperature source Oral, resp. rate 17, weight 73.9 kg, SpO2 99 %. Physical Exam  Constitutional: He is oriented to person, place, and time. He appears well-developed and well-nourished. No distress.  HENT:  Head: Normocephalic and atraumatic.  Mouth/Throat: Oropharynx is clear and moist.  Eyes: Pupils are equal, round, and reactive to light. Conjunctivae and EOM are normal. No scleral icterus.  Neck: Normal range of motion. Neck supple. No JVD present. No tracheal deviation present. No thyromegaly present.  GI: Soft. Bowel sounds are normal. He exhibits no distension. There is no tenderness.  Genitourinary:  Genitourinary Comments: Deferred  Musculoskeletal: Normal range of motion. He exhibits no edema.  Lymphadenopathy:    He has no cervical adenopathy.  Neurological: He is alert and oriented to person, place, and time. No cranial nerve deficit.  Skin: Skin is warm and dry. No rash noted. No erythema.  Psychiatric: He has a normal mood and affect. His behavior is normal. Judgment and thought content normal.     Assessment/Plan This is an 84 year old male admitted for confusion. 1.  Altered mental status: Confusion; new problem.  Check for reversible causes of delirium.  Continue to monitor mental status. 2.  Acute kidney injury: Superimposed upon chronic kidney disease.  Hydrate with intravenous fluid.  Avoid nephrotoxic agents. 3.  Atrial fibrillation: Rate controlled; continue warfarin. 4.  CHF: Systolic; chronic.  Continue Entresto and metoprolol 5.  DVT prophylaxis: Therapeutic anticoagulation as above 6.  GI prophylaxis: None The patient is a full code.  Time spent on admission orders and patient care approximately 45 minutes  Harrie Foreman, MD 11/19/2017, 6:14 AM

## 2017-11-19 NOTE — Progress Notes (Signed)
ANTICOAGULATION CONSULT NOTE - Initial Consult  Pharmacy Consult for warfarin dosing  Indication: atrial fibrillation  No Known Allergies  Patient Measurements: Weight: 163 lb (73.9 kg) Heparin Dosing Weight: n/a  Vital Signs: Temp: 98.2 F (36.8 C) (08/10 0505) Temp Source: Oral (08/10 0505) BP: 101/73 (08/10 0505) Pulse Rate: 80 (08/10 0505)  Labs: Recent Labs    11/19/17 0034 11/19/17 0104 11/19/17 0938  HGB 13.9  --   --   HCT 40.6  --   --   PLT 143*  --   --   LABPROT  --   --  26.3*  INR  --   --  2.44  CREATININE  --  1.64*  --   TROPONINI  --  <0.03  --     Estimated Creatinine Clearance: 29.7 mL/min (A) (by C-G formula based on SCr of 1.64 mg/dL (H)).   Medical History: Past Medical History:  Diagnosis Date  . Anxiety   . Arrhythmia   . Atrial fibrillation ([place])   . Basal cell cancer 2008   on shoulder  . Bipolar disorder [facility])    hospitalized in past  . Cardiomyopathy ([place])   . CHF (congestive heart failure) ([place])   . CVA (cerebral vascular accident) ([place]) 8/11  . Depression   . Dyslipidemia   . Generalized anxiety disorder   . GERD (gastroesophageal reflux disease)   . History of kidney stones    30 years ago  . Hypothyroidism   . Macular degeneration   . Melanoma ([place])    resected from scalp approximately 1 year ago.   . Osteoarthritis    in neck and left knee  . Tremor    noted when writing  . Valvular heart disease     Medications:  Home regimen 2.5 mg daily with 5 mg every 5 days per wife.  Assessment: 8/10 INR 2.44  Goal of Therapy:  INR 2-3    Plan:  Patient weekly dose is 20mg . This is about 3mg  daily. I will change dose to 3mg  daily for ease of dosing. Continue to follow INR  Ramond Dial, Pharm.D, BCPS Clinical Pharmacist 11/19/2017,11:08 AM

## 2017-11-19 NOTE — Progress Notes (Signed)
ANTICOAGULATION CONSULT NOTE - Initial Consult  Pharmacy Consult for warfarin dosing  Indication: atrial fibrillation  No Known Allergies  Patient Measurements: Weight: 163 lb (73.9 kg) Heparin Dosing Weight: n/a  Vital Signs: Temp: 98.2 F (36.8 C) (08/10 0505) Temp Source: Oral (08/10 0505) BP: 101/73 (08/10 0505) Pulse Rate: 80 (08/10 0505)  Labs: Recent Labs    11/19/17 0034 11/19/17 0104  HGB 13.9  --   HCT 40.6  --   PLT 143*  --   CREATININE  --  1.64*  TROPONINI  --  <0.03    Estimated Creatinine Clearance: 29.7 mL/min (A) (by C-G formula based on SCr of 1.64 mg/dL (H)).   Medical History: Past Medical History:  Diagnosis Date  . Anxiety   . Arrhythmia   . Atrial fibrillation ([place])   . Basal cell cancer 2008   on shoulder  . Bipolar disorder [facility])    hospitalized in past  . Cardiomyopathy ([place])   . CHF (congestive heart failure) ([place])   . CVA (cerebral vascular accident) ([place]) 8/11  . Depression   . Dyslipidemia   . Generalized anxiety disorder   . GERD (gastroesophageal reflux disease)   . History of kidney stones    30 years ago  . Hypothyroidism   . Macular degeneration   . Melanoma ([place])    resected from scalp approximately 1 year ago.   . Osteoarthritis    in neck and left knee  . Tremor    noted when writing  . Valvular heart disease     Medications:  Home regimen 2 mg daily with 5 mg every 5 days.   Assessment: INR pending  Goal of Therapy:  INR 2-3    Plan:  Warfarin 3 mg daily ordered.   David Duncan 11/19/2017,6:33 AM

## 2017-11-19 NOTE — ED Provider Notes (Signed)
[facility] Emergency Department Provider Note  ____________________________________________   First MD Initiated Contact with Patient 11/19/17 [phone]     (approximate)  I have reviewed the triage vital signs and the nursing notes.   HISTORY  Chief Complaint Emesis  5 exemption history limited by the patient's confusion  HPI David Duncan is a 84 y.o. male who is brought to the emergency department via EMS for "dizziness I think".   The patient is profoundly confused and says that he has been "dizzy" throughout the course of the day.  He also apparently has vomited multiple times according to EMS.  His first blood pressure was 77 on arrival and he was given nearly a liter of fluid and 4 mg of ondansetron in route.  The patient himself is unable to quantify his dizziness and unable to provide any meaningful history.   Past Medical History:  Diagnosis Date  . Anxiety   . Arrhythmia   . Atrial fibrillation ([place])   . Basal cell cancer 2008   on shoulder  . Bipolar disorder [facility])    hospitalized in past  . Cardiomyopathy ([place])   . CHF (congestive heart failure) ([place])   . CVA (cerebral vascular accident) ([place]) 8/11  . Depression   . Dyslipidemia   . Generalized anxiety disorder   . GERD (gastroesophageal reflux disease)   . History of kidney stones    30 years ago  . Hypothyroidism   . Macular degeneration   . Melanoma ([place])    resected from scalp approximately 1 year ago.   . Osteoarthritis    in neck and left knee  . Tremor    noted when writing  . Valvular heart disease     Patient Active Problem List   Diagnosis Date Noted  . Confusion 11/19/2017  . Neurogenic claudication 10/05/2017  . Preoperative examination 09/26/2017  . H/O falling 05/02/2017  . Elevated serum creatinine 05/02/2017  . Hyperkalemia 05/02/2017  . Depression 11/12/2016  . Right shoulder injury 05/26/2016  . Pain in joint, shoulder region 04/02/2016  . BPH  associated with nocturia 02/17/2016  . Mass in the abdomen 02/09/2016  . Disequilibrium 01/29/2016  . Unsteadiness 01/29/2016  . Family history of prostate cancer 01/07/2016  . Hypothyroidism 01/07/2016  . S/P total knee arthroplasty 12/03/2015  . Routine general medical examination at a health care facility 08/08/2015  . Spinal stenosis in cervical region 02/05/2015  . Cervical spinal stenosis 02/02/2015  . Left shoulder pain 12/06/2014  . Cardiomyopathy, dilated ([place]) 09/16/2014  . Cerebral vascular accident ([place]) 09/16/2014  . Heart valve disease 09/16/2014  . MI (mitral incompetence) 08/08/2014  . TI (tricuspid incompetence) 08/08/2014  . Atrial fibrillation, chronic ([place]) 01/16/2014  . Chronic systolic heart failure ([place]) 01/16/2014  . Bunion, right foot 08/17/2013  . Macular degeneration 10/16/2012  . Encounter for Medicare annual wellness exam 08/16/2012  . Hearing decreased 12/16/2010  . Medication adverse effect 07/28/2010  . Prostate cancer screening 07/28/2010  . Long term (current) use of anticoagulants 07/22/2010  . Hyperlipidemia 06/01/2010  . ATRIAL FLUTTER 11/28/2009  . Bipolar disorder ([place]) 02/20/2009    Past Surgical History:  Procedure Laterality Date  . BACK SURGERY  1991   L4-5  . CATARACT EXTRACTION W/ INTRAOCULAR LENS IMPLANT Bilateral   . EYE SURGERY    . JOINT REPLACEMENT    . KNEE ARTHROPLASTY Left 12/03/2015   Procedure: COMPUTER ASSISTED TOTAL KNEE ARTHROPLASTY;  Surgeon: Dereck Leep, MD;  Location:  [facility] ORS;  Service: Orthopedics;  Laterality: Left;  . LUMBAR LAMINECTOMY/DECOMPRESSION MICRODISCECTOMY N/A 10/05/2017   Procedure: LUMBAR LAMINECTOMY/DECOMPRESSION MICRODISCECTOMY 3 LEVELS L2-5, CORPECTOMY;  Surgeon: Meade Maw, MD;  Location: [facility] ORS;  Service: Neurosurgery;  Laterality: N/A;  . SKIN CANCER EXCISION     melenoma on scalp    Prior to Admission medications   Medication Sig Start Date End Date Taking? Authorizing Provider   Acetaminophen 500 MG coapsule Take 2 capsules by mouth every 6 (six) hours as needed for pain.    Yes [provider]  Amino Acids (FREE FORM AMINO ACID COMPLEX PO) Take 1 capsule by mouth 2 (two) times daily.    Yes [provider]  Ascorbic Acid (VITAMIN C) 500 MG tablet Take 500 mg by mouth 2 (two) times daily.    Yes [provider]  B Complex Vitamins (B COMPLEX PO) Take 1 tablet by mouth 2 (two) times daily.    Yes [provider]  baclofen (LIORESAL) 10 MG tablet Take 10 mg by mouth 3 (three) times daily as needed for muscle spasms.  11/10/17  Yes [provider]  Calcium-Magnesium-Vitamin D (CALCIUM 500 PO) Take 2 tablets by mouth daily.   Yes [provider]  Cyanocobalamin (VITAMIN B-12) 1000 MCG SUBL Place 1 tablet under the tongue daily.    Yes [provider]  diclofenac sodium (VOLTAREN) 1 % GEL Apply 2 g topically 3 (three) times daily as needed (pain).    Yes [provider]  escitalopram (LEXAPRO) 20 MG tablet Take 1 tablet (20 mg total) by mouth every morning. Patient taking differently: Take 10 mg by mouth every morning.  01/17/17  Yes Rainey Pines, MD  furosemide (LASIX) 20 MG tablet Take 20 mg by mouth daily as needed for fluid.    Yes [provider]  L-Methylfolate-Algae (DEPLIN 15) 15-90.314 MG CAPS Take 15 mg by mouth every morning. 08/02/16  Yes Rainey Pines, MD  loperamide (IMODIUM) 2 MG capsule Take 2 mg by mouth as needed for diarrhea or loose stools.   Yes [provider]  MELATONIN PO Take 2 tablets by mouth at bedtime. Restful Sleep by [facility] : Hops Extract 4:1 (Humulus lupulus) (flower) Chamomile (Matricaria chamomilla) (flower) Passion Flower Extract (standardized to 4% vitexin) (Passiflora incarnate) (flower) 5 HTP (5-Hydroxtryptophan) (Griffonia simplicifolia) (seed)   Yes [provider]  metoprolol succinate (TOPROL-XL) 100 MG 24 hr tablet Take 1 tablet (100 mg total)  by mouth daily. Take with or immediately following a meal. 01/04/17 05/02/18 Yes Hackney, Aura Fey, FNP  Multiple Vitamin (MULTIVITAMIN) capsule Take 1 capsule by mouth daily.   Yes [provider]  oxyCODONE (ROXICODONE) 5 MG immediate release tablet Take 1 tablet (5 mg total) by mouth every 6 (six) hours as needed for breakthrough pain. 10/06/17  Yes Marin Olp, PA-C  sacubitril-valsartan (ENTRESTO) 24-26 MG Take 1 tablet by mouth 2 (two) times daily. 08/26/16  Yes Alisa Graff, FNP  Specialty Vitamins Products (ICAPS LUTEIN-ZEAXANTHIN PO) Take 2 capsules by mouth daily.   Yes [provider]  Specialty Vitamins Products (PROSTATE PO) Take 1 tablet by mouth daily.    Yes [provider]  warfarin (COUMADIN) 5 MG tablet Take 2.5-5 mg by mouth See admin instructions. Takes 5mg  every 5 days. 2.5 mg every other day. 11/13/17  Yes [provider]  ondansetron (ZOFRAN ODT) 4 MG disintegrating tablet Take 1 tablet (4 mg total) by mouth every 8 (eight) hours as needed for nausea  or vomiting. 11/19/17   Darel Hong, MD    Allergies Patient has no known allergies.  Family History  Problem Relation Age of Onset  . Stroke Father   . Heart attack Father   . Cancer Brother        prostate  . Coronary artery disease Brother   . Hypertension Brother     Social History Social History   Tobacco Use  . Smoking status: Never Smoker  . Smokeless tobacco: Never Used  Substance Use Topics  . Alcohol use: No    Alcohol/week: 0.0 standard drinks  . Drug use: No    Review of Systems Level 5 exemption history limited by the patient's clinical condition ____________________________________________   PHYSICAL EXAM:  VITAL SIGNS: ED Triage Vitals  Enc Vitals Group     BP      Pulse      Resp      Temp      Temp src      SpO2      Weight      Height      Head Circumference      Peak Flow      Pain Score      Pain Loc      Pain Edu?      Excl. in [place]?       Constitutional: Alert and oriented x2 to name and location.  He thinks it is 2003 and he thinks Maudie Flakes is the president.  He speaks very slowly methodically and is obviously confused.  He appears delirious and is seen responding to internal stimuli Eyes: PERRL EOMI. midrange and brisk.  No nystagmus appreciated Head: Atraumatic. Nose: No congestion/rhinnorhea. Mouth/Throat: No trismus Neck: No stridor.   Cardiovascular: Irregularly irregular Respiratory: Normal respiratory effort.  No retractions. Lungs CTAB and moving good air Gastrointestinal: Soft nontender Musculoskeletal: No lower extremity edema   Neurologic:  No gross focal neurologic deficits are appreciated. Skin:  Skin is warm, dry and intact. No rash noted. Psychiatric: Delirious    ____________________________________________   DIFFERENTIAL includes but not limited to  Urinary tract infection, stroke, TIA, vertigo, dehydration ____________________________________________   LABS (all labs ordered are listed, but only abnormal results are displayed)  Labs Reviewed  CBC WITH DIFFERENTIAL/PLATELET - Abnormal; Notable for the following components:      Result Value   RBC 4.03 (*)    MCV 100.7 (*)    MCH 34.4 (*)    RDW 14.8 (*)    Platelets 143 (*)    All other components within normal limits  URINALYSIS, COMPLETE (UACMP) WITH MICROSCOPIC - Abnormal; Notable for the following components:   Color, Urine YELLOW (*)    APPearance CLEAR (*)    Protein, ur 30 (*)    All other components within normal limits  COMPREHENSIVE METABOLIC PANEL - Abnormal; Notable for the following components:   CO2 21 (*)    Glucose, Bld 138 (*)    BUN 35 (*)    Creatinine, Ser 1.64 (*)    Calcium 8.7 (*)    GFR calc non Af Amer 37 (*)    GFR calc Af Amer 42 (*)    All other components within normal limits  PROTIME-INR - Abnormal; Notable for the following components:   Prothrombin Time 26.3 (*)    All other components within  normal limits  LACTIC ACID, PLASMA  LIPASE, BLOOD  TROPONIN I  TSH  RPR  PROTIME-INR    Lab work reviewed  by me with no acute disease __________________________________________  EKG  ED ECG REPORT I, Darel Hong, the attending physician, personally viewed and interpreted this ECG.  Date: 11/19/2017 EKG Time:  Rate: 100 Rhythm: Atrial fibrillation QRS Axis: normal Intervals: normal ST/T Wave abnormalities: normal Narrative Interpretation: no evidence of acute ischemia  ____________________________________________  RADIOLOGY  MRI of the brain reviewed by me with no acute disease CT scan of the head reviewed by me with no acute disease ____________________________________________   PROCEDURES  Procedure(s) performed: no  Procedures  Critical Care performed: no  ____________________________________________   INITIAL IMPRESSION / ASSESSMENT AND PLAN / ED COURSE  Pertinent labs & imaging results that were available during my care of the patient were reviewed by me and considered in my medical decision making (see chart for details).   As part of my medical decision making, I reviewed the following data within the [place] History obtained from family if available, nursing notes, old chart and ekg, as well as notes from prior ED visits.  Patient arrives via EMS with no family to provide collateral history.  He is clearly confused and unclear if this is delirium versus dementia.  He does report "dizziness" which is nonspecific but could represent an acute infarct.  Quick head CT with no acute disease but he will be sent for MRI of the brain to further evaluate.  MRI is unremarkable and lab work is unrevealing for the etiology of his symptoms.  The patient's wife is now at bedside stating that yesterday her husband was "normal" and this is an acute change over the past hour prior to arrival.  As he is acutely altered he will require inpatient  admission for further work-up and management.      ____________________________________________   FINAL CLINICAL IMPRESSION(S) / ED DIAGNOSES  Final diagnoses:  Dizziness  Non-intractable vomiting with nausea, unspecified vomiting type  Delirium      NEW MEDICATIONS STARTED DURING THIS VISIT:  Current Discharge Medication List    START taking these medications   Details  ondansetron (ZOFRAN ODT) 4 MG disintegrating tablet Take 1 tablet (4 mg total) by mouth every 8 (eight) hours as needed for nausea or vomiting. Qty: 20 tablet, Refills: 0         Note:  This document was prepared using Dragon voice recognition software and may include unintentional dictation errors.     Darel Hong, MD 11/19/17 2250

## 2017-11-19 NOTE — Care Management Obs Status (Signed)
[place] NOTIFICATION   Patient Details  Name: David Duncan MRN: [id number] Date of Birth: November 24, 1930   Medicare Observation Status Notification Given:  Yes    Jahleah Mariscal A Dontay Harm, RN 11/19/2017, 9:30 AM

## 2017-11-19 NOTE — Discharge Instructions (Signed)
Fortunately today your blood work, your EKG, and her MRI were reassuring.  Please follow-up with your primary care physician in 2 days for recheck and return to the emergency department for any concerns.  It was a pleasure to take care of you today, and thank you for coming to our emergency department.  If you have any questions or concerns before leaving please ask the nurse to grab me and I'm more than happy to go through your aftercare instructions again.  If you were prescribed any opioid pain medication today such as Norco, Vicodin, Percocet, morphine, hydrocodone, or oxycodone please make sure you do not drive when you are taking this medication as it can alter your ability to drive safely.  If you have any concerns once you are home that you are not improving or are in fact getting worse before you can make it to your follow-up appointment, please do not hesitate to call 911 and come back for further evaluation.  Darel Hong, MD  Results for orders placed or performed during the hospital encounter of 11/19/17  CBC with Differential  Result Value Ref Range   WBC 8.4 3.8 - 10.6 K/uL   RBC 4.03 (L) 4.40 - 5.90 MIL/uL   Hemoglobin 13.9 13.0 - 18.0 g/dL   HCT 40.6 40.0 - 52.0 %   MCV 100.7 (H) 80.0 - 100.0 fL   MCH 34.4 (H) 26.0 - 34.0 pg   MCHC 34.2 32.0 - 36.0 g/dL   RDW 14.8 (H) 11.5 - 14.5 %   Platelets 143 (L) 150 - 440 K/uL   Neutrophils Relative % 72 %   Neutro Abs 6.1 1.4 - 6.5 K/uL   Lymphocytes Relative 20 %   Lymphs Abs 1.7 1.0 - 3.6 K/uL   Monocytes Relative 6 %   Monocytes Absolute 0.5 0.2 - 1.0 K/uL   Eosinophils Relative 1 %   Eosinophils Absolute 0.1 0 - 0.7 K/uL   Basophils Relative 1 %   Basophils Absolute 0.0 0 - 0.1 K/uL  Lactic acid, plasma  Result Value Ref Range   Lactic Acid, Venous 1.5 0.5 - 1.9 mmol/L  Urinalysis, Complete w Microscopic  Result Value Ref Range   Color, Urine YELLOW (A) YELLOW   APPearance CLEAR (A) CLEAR   Specific Gravity, Urine  1.014 1.005 - 1.030   pH 5.0 5.0 - 8.0   Glucose, UA NEGATIVE NEGATIVE mg/dL   Hgb urine dipstick NEGATIVE NEGATIVE   Bilirubin Urine NEGATIVE NEGATIVE   Ketones, ur NEGATIVE NEGATIVE mg/dL   Protein, ur 30 (A) NEGATIVE mg/dL   Nitrite NEGATIVE NEGATIVE   Leukocytes, UA NEGATIVE NEGATIVE   RBC / HPF 0-5 0 - 5 RBC/hpf   WBC, UA 0-5 0 - 5 WBC/hpf   Bacteria, UA NONE SEEN NONE SEEN   Squamous Epithelial / LPF NONE SEEN 0 - 5  Comprehensive metabolic panel  Result Value Ref Range   Sodium 138 135 - 145 mmol/L   Potassium 4.4 3.5 - 5.1 mmol/L   Chloride 108 98 - 111 mmol/L   CO2 21 (L) 22 - 32 mmol/L   Glucose, Bld 138 (H) 70 - 99 mg/dL   BUN 35 (H) 8 - 23 mg/dL   Creatinine, Ser 1.64 (H) 0.61 - 1.24 mg/dL   Calcium 8.7 (L) 8.9 - 10.3 mg/dL   Total Protein 6.8 6.5 - 8.1 g/dL   Albumin 3.6 3.5 - 5.0 g/dL   AST 29 15 - 41 U/L   ALT 21 0 -  44 U/L   Alkaline Phosphatase 69 38 - 126 U/L   Total Bilirubin 1.0 0.3 - 1.2 mg/dL   GFR calc non Af Amer 37 (L) >60 mL/min   GFR calc Af Amer 42 (L) >60 mL/min   Anion gap 9 5 - 15  Lipase, blood  Result Value Ref Range   Lipase 46 11 - 51 U/L  Troponin I  Result Value Ref Range   Troponin I <0.03 <0.03 ng/mL   Ct Head Wo Contrast  Result Date: 11/19/2017 CLINICAL DATA:  Acute onset of vertigo, nausea and vomiting. Hypotension. EXAM: CT HEAD WITHOUT CONTRAST TECHNIQUE: Contiguous axial images were obtained from the base of the skull through the vertex without intravenous contrast. COMPARISON:  CT of the head performed 04/26/2017, and MRI of the brain performed 11/12/2009 FINDINGS: Brain: No evidence of acute infarction, hemorrhage, hydrocephalus, extra-axial collection or mass lesion / mass effect. Prominence of the ventricles and sulci reflects moderate cortical volume loss. Cerebellar atrophy is noted. Scattered periventricular and subcortical white matter change likely reflects small vessel ischemic microangiopathy. A chronic infarct is noted  at the left external capsule. The brainstem and fourth ventricle are within normal limits. The cerebral hemispheres demonstrate grossly normal gray-white differentiation. No mass effect or midline shift is seen. Vascular: No hyperdense vessel or unexpected calcification. Skull: There is no evidence of fracture; visualized osseous structures are unremarkable in appearance. Sinuses/Orbits: The visualized portions of the orbits are within normal limits. The paranasal sinuses and mastoid air cells are well-aerated. Other: No significant soft tissue abnormalities are seen. IMPRESSION: 1. No acute intracranial pathology seen on CT. 2. Moderate cortical volume loss and scattered small vessel ischemic microangiopathy. 3. 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 Electronically Signed   By: Garald Balding M.D.   On: 11/19/2017 01:06   Mr Brain Wo Contrast (neuro Protocol)  Result Date: 11/19/2017 CLINICAL DATA:  Persistent central vertigo. EXAM: MRI HEAD WITHOUT CONTRAST TECHNIQUE: Multiplanar, multiecho pulse sequences of the brain and surrounding structures were obtained without intravenous contrast. COMPARISON:  Head CT 11/19/2017 Brain MRI 11/04/2014 FINDINGS: BRAIN: There is no acute infarct, acute hemorrhage or mass effect. The midline structures are normal. Old left external capsule infarct. Early confluent hyperintense T2-weighted signal of the periventricular and deep white matter, most commonly due to chronic ischemic microangiopathy. Generalized atrophy without lobar predilection. Susceptibility-sensitive sequences show no chronic microhemorrhage or superficial siderosis. VASCULAR: Major intracranial arterial and venous sinus flow voids are preserved. SKULL AND UPPER CERVICAL SPINE: The visualized skull base, calvarium, upper cervical spine and extracranial soft tissues are normal. SINUSES/ORBITS: No fluid levels or advanced mucosal thickening. No mastoid or middle ear effusion. The orbits are normal.  IMPRESSION: 1. No acute intracranial abnormality. 2. Old left external capsule small vessel infarct and sequelae of chronic microvascular ischemia. 3. Generalized atrophy. Electronically Signed   By: Ulyses Jarred M.D.   On: 11/19/2017 01:54

## 2017-11-19 NOTE — ED Notes (Signed)
David Duncan, ed tech notified of need for transport to floor.

## 2017-11-20 ENCOUNTER — Observation Stay: Payer: PPO

## 2017-11-20 DIAGNOSIS — I4891 Unspecified atrial fibrillation: Secondary | ICD-10-CM | POA: Diagnosis not present

## 2017-11-20 DIAGNOSIS — I5022 Chronic systolic (congestive) heart failure: Secondary | ICD-10-CM | POA: Diagnosis not present

## 2017-11-20 DIAGNOSIS — N183 Chronic kidney disease, stage 3 (moderate): Secondary | ICD-10-CM | POA: Diagnosis not present

## 2017-11-20 DIAGNOSIS — I129 Hypertensive chronic kidney disease with stage 1 through stage 4 chronic kidney disease, or unspecified chronic kidney disease: Secondary | ICD-10-CM | POA: Diagnosis not present

## 2017-11-20 DIAGNOSIS — R410 Disorientation, unspecified: Secondary | ICD-10-CM | POA: Diagnosis not present

## 2017-11-20 DIAGNOSIS — N179 Acute kidney failure, unspecified: Secondary | ICD-10-CM | POA: Diagnosis not present

## 2017-11-20 LAB — BASIC METABOLIC PANEL
Anion gap: 9 (ref 5–15)
BUN: 32 mg/dL — AB (ref 8–23)
CO2: 24 mmol/L (ref 22–32)
Calcium: 9.1 mg/dL (ref 8.9–10.3)
Chloride: 107 mmol/L (ref 98–111)
Creatinine, Ser: 1.82 mg/dL — ABNORMAL HIGH (ref 0.61–1.24)
GFR calc non Af Amer: 32 mL/min — ABNORMAL LOW (ref >60)
GFR, EST AFRICAN AMERICAN: 37 mL/min — AB (ref >60)
Glucose, Bld: 89 mg/dL (ref 70–99)
POTASSIUM: 4.3 mmol/L (ref 3.5–5.1)
SODIUM: 140 mmol/L (ref 135–145)

## 2017-11-20 LAB — AMMONIA: AMMONIA: 12 umol/L (ref 9–35)

## 2017-11-20 LAB — PROTIME-INR
INR: 2.33
Prothrombin Time: 25.4 seconds — ABNORMAL HIGH (ref 11.4–15.2)

## 2017-11-20 MED ORDER — SODIUM CHLORIDE 0.45 % IV SOLN
INTRAVENOUS | Status: DC
  Administered 2017-11-20 – 2017-11-22 (×3): via INTRAVENOUS

## 2017-11-20 NOTE — Progress Notes (Signed)
[name] NAME: David Duncan    MR#:  [id number]  DATE OF BIRTH:  May 20, 1930  SUBJECTIVE:  CHIEF COMPLAINT:   Chief Complaint  Patient presents with  . Emesis  Patient remains confused and disoriented, noted worsening renal function  REVIEW OF SYSTEMS:  CONSTITUTIONAL: No fever, fatigue or weakness.  EYES: No blurred or double vision.  EARS, NOSE, AND THROAT: No tinnitus or ear pain.  RESPIRATORY: No cough, shortness of breath, wheezing or hemoptysis.  CARDIOVASCULAR: No chest pain, orthopnea, edema.  GASTROINTESTINAL: No nausea, vomiting, diarrhea or abdominal pain.  GENITOURINARY: No dysuria, hematuria.  ENDOCRINE: No polyuria, nocturia,  HEMATOLOGY: No anemia, easy bruising or bleeding SKIN: No rash or lesion. MUSCULOSKELETAL: No joint pain or arthritis.   NEUROLOGIC: No tingling, numbness, weakness.  PSYCHIATRY: No anxiety or depression.   ROS  DRUG ALLERGIES:  No Known Allergies  VITALS:  Blood pressure 135/88, pulse 90, temperature 98.1 F (36.7 C), temperature source Oral, resp. rate 13, weight 71 kg, SpO2 95 %.  PHYSICAL EXAMINATION:  GENERAL:  84 y.o.-year-old patient lying in the bed with no acute distress.  EYES: Pupils equal, round, reactive to light and accommodation. No scleral icterus. Extraocular muscles intact.  HEENT: Head atraumatic, normocephalic. Oropharynx and nasopharynx clear.  NECK:  Supple, no jugular venous distention. No thyroid enlargement, no tenderness.  LUNGS: Normal breath sounds bilaterally, no wheezing, rales,rhonchi or crepitation. No use of accessory muscles of respiration.  CARDIOVASCULAR: S1, S2 normal. No murmurs, rubs, or gallops.  ABDOMEN: Soft, nontender, nondistended. Bowel sounds present. No organomegaly or mass.  EXTREMITIES: No pedal edema, cyanosis, or clubbing.  NEUROLOGIC: Cranial nerves II through XII are intact. Muscle strength 5/5 in all extremities. Sensation  intact. Gait not checked.  PSYCHIATRIC: The patient is alert and oriented x 3.  SKIN: No obvious rash, lesion, or ulcer.   Physical Exam LABORATORY PANEL:   CBC Recent Labs  Lab 11/19/17 0034  WBC 8.4  HGB 13.9  HCT 40.6  PLT 143*   ------------------------------------------------------------------------------------------------------------------  Chemistries  Recent Labs  Lab 11/19/17 0104 11/20/17 0443  NA 138 140  K 4.4 4.3  CL 108 107  CO2 21* 24  GLUCOSE 138* 89  BUN 35* 32*  CREATININE 1.64* 1.82*  CALCIUM 8.7* 9.1  AST 29  --   ALT 21  --   ALKPHOS 69  --   BILITOT 1.0  --    ------------------------------------------------------------------------------------------------------------------  Cardiac Enzymes Recent Labs  Lab 11/19/17 0104  TROPONINI <0.03   ------------------------------------------------------------------------------------------------------------------  RADIOLOGY:  Ct Head Wo Contrast  Result Date: 11/19/2017 CLINICAL DATA:  Acute onset of vertigo, nausea and vomiting. Hypotension. EXAM: CT HEAD WITHOUT CONTRAST TECHNIQUE: Contiguous axial images were obtained from the base of the skull through the vertex without intravenous contrast. COMPARISON:  CT of the head performed 04/26/2017, and MRI of the brain performed 11/12/2009 FINDINGS: Brain: No evidence of acute infarction, hemorrhage, hydrocephalus, extra-axial collection or mass lesion / mass effect. Prominence of the ventricles and sulci reflects moderate cortical volume loss. Cerebellar atrophy is noted. Scattered periventricular and subcortical white matter change likely reflects small vessel ischemic microangiopathy. A chronic infarct is noted at the left external capsule. The brainstem and fourth ventricle are within normal limits. The cerebral hemispheres demonstrate grossly normal gray-white differentiation. No mass effect or midline shift is seen. Vascular: No hyperdense vessel or  unexpected calcification. Skull: There is no evidence of fracture; visualized osseous structures are unremarkable  in appearance. Sinuses/Orbits: The visualized portions of the orbits are within normal limits. The paranasal sinuses and mastoid air cells are well-aerated. Other: No significant soft tissue abnormalities are seen. IMPRESSION: 1. No acute intracranial pathology seen on CT. 2. Moderate cortical volume loss and scattered small vessel ischemic microangiopathy. 3. 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 Electronically Signed   By: Garald Balding M.D.   On: 11/19/2017 01:06   Mr Brain Wo Contrast (neuro Protocol)  Result Date: 11/19/2017 CLINICAL DATA:  Persistent central vertigo. EXAM: MRI HEAD WITHOUT CONTRAST TECHNIQUE: Multiplanar, multiecho pulse sequences of the brain and surrounding structures were obtained without intravenous contrast. COMPARISON:  Head CT 11/19/2017 Brain MRI 11/04/2014 FINDINGS: BRAIN: There is no acute infarct, acute hemorrhage or mass effect. The midline structures are normal. Old left external capsule infarct. Early confluent hyperintense T2-weighted signal of the periventricular and deep white matter, most commonly due to chronic ischemic microangiopathy. Generalized atrophy without lobar predilection. Susceptibility-sensitive sequences show no chronic microhemorrhage or superficial siderosis. VASCULAR: Major intracranial arterial and venous sinus flow voids are preserved. SKULL AND UPPER CERVICAL SPINE: The visualized skull base, calvarium, upper cervical spine and extracranial soft tissues are normal. SINUSES/ORBITS: No fluid levels or advanced mucosal thickening. No mastoid or middle ear effusion. The orbits are normal. IMPRESSION: 1. No acute intracranial abnormality. 2. Old left external capsule small vessel infarct and sequelae of chronic microvascular ischemia. 3. Generalized atrophy. Electronically Signed   By: Ulyses Jarred M.D.   On: 11/19/2017 01:54   Dg  Abd 2 Views  Result Date: 11/19/2017 CLINICAL DATA:  Patient poor historian at this time. Encounter for emesis. Hx kidney stones. EXAM: ABDOMEN - 2 VIEW COMPARISON:  CT 02/17/2016 FINDINGS: Coarse bibasilar interstitial markings left worse than right. No pleural effusion. Heart size upper limits normal. No free air. Normal bowel gas pattern. Pelvic phleboliths and vascular calcifications. No unexplained abdominal calcifications. Spondylitic changes in the lower lumbar spine. IMPRESSION: 1. Normal bowel gas pattern.  No free air. Electronically Signed   By: Lucrezia Europe M.D.   On: 11/19/2017 09:52    ASSESSMENT AND PLAN:  This is an 84 year old male admitted for confusion, acute kidney injury  *Acute confusion/altered mental status  Confounded by bipolar affective disorder Suspected patient is at baseline neurologically MRI negative for any acute process Follow-up on neurology evaluation/recommendations, check ammonia level, RPR, continue neurochecks per routine, increase nursing care PRN, aspiration/fall precautions while in house, physical therapy to evaluate/treat, continue close medical monitoring  *Acute kidney injury Worsening noted Hold Entresto, avoid nephrotoxic agents, check renal ultrasound, IV fluids for rehydration, nephrology to see, BMP in the morning  *Chronic bipolar affective disorder Stable Continue Seroquel  *Chronic Atrial fibrillation Stable Continue Toprol XL and Coumadin  *Congestive heart failure without exacerbation Continue metoprolol, Entresto on hold given acute renal failure     All the records are reviewed and case discussed with Care Management/Social Workerr. Management plans discussed with the patient, family and they are in agreement.  CODE STATUS: full  TOTAL TIME TAKING CARE OF THIS PATIENT: 45 minutes.     POSSIBLE D/C IN 1-2 DAYS, DEPENDING ON CLINICAL CONDITION.   Avel Peace Salary M.D on 11/20/2017   Between 7am to 6pm - Pager -  [phone]  After 6pm go to [email or web address] - password [email or web address]  Office  [phone]  CC: Primary care physician; Tower, Wynelle Fanny, MD  Note: This dictation was prepared with Dragon dictation along with smaller phrase  technology. Any transcriptional errors that result from this process are unintentional.

## 2017-11-20 NOTE — Progress Notes (Signed)
ANTICOAGULATION CONSULT NOTE - Follow up [place] for warfarin dosing  Indication: atrial fibrillation  No Known Allergies  Patient Measurements: Weight: 156 lb 8.4 oz (71 kg)  Vital Signs: Temp: 98.1 F (36.7 C) (08/11 0337) Temp Source: Oral (08/11 0337) BP: 135/88 (08/11 0337) Pulse Rate: 90 (08/11 0337)  Labs: Recent Labs    11/19/17 0034 11/19/17 0104 11/19/17 0938 11/20/17 0443  HGB 13.9  --   --   --   HCT 40.6  --   --   --   PLT 143*  --   --   --   LABPROT  --   --  26.3* 25.4*  INR  --   --  2.44 2.33  CREATININE  --  1.64*  --  1.82*  TROPONINI  --  <0.03  --   --     Estimated Creatinine Clearance: 26.8 mL/min (A) (by C-G formula based on SCr of 1.82 mg/dL (H)).   Medications:  Home regimen 2.5 mg daily with 5 mg every 5 days per wife.  Assessment: 8/10 INR 2.44 - warfarin 3 mg 8/10 INR 2.33  Goal of Therapy:  INR 2-3    Plan:  Patient weekly dose is 20 mg. This is about 3mg  daily. Will continue warfarin 3 mg PO daily for ease of dosing for now. Continue to follow INR  Will need to monitor CBC at least every three days per policy.  Rayna Sexton, PharmD, BCPS Clinical Pharmacist 11/20/2017 1:45 PM

## 2017-11-20 NOTE — Consult Note (Signed)
Reason for Consult:AMS Referring Physician: Salary  CC: AMS  HPI: David Duncan is an 84 y.o. male with multiple medical problems who was at his baseline until Friday night when patient became confused.  Did not know how to get around the house.  Did not know where he was.  Wife feels that he is back to baseline at this time.  She describes baseline as patient sitting around the house and reading.  Leaves the house rarely.  No exercise.  Does not drive or handle house finances.  Has some word finding difficulties at times.   Was recently started on Baclofen for his back.  Past Medical History:  Diagnosis Date  . Anxiety   . Arrhythmia   . Atrial fibrillation ([place])   . Basal cell cancer 2008   on shoulder  . Bipolar disorder [facility])    hospitalized in past  . Cardiomyopathy ([place])   . CHF (congestive heart failure) ([place])   . CVA (cerebral vascular accident) ([place]) 8/11  . Depression   . Dyslipidemia   . Generalized anxiety disorder   . GERD (gastroesophageal reflux disease)   . History of kidney stones    30 years ago  . Hypothyroidism   . Macular degeneration   . Melanoma ([place])    resected from scalp approximately 1 year ago.   . Osteoarthritis    in neck and left knee  . Tremor    noted when writing  . Valvular heart disease     Past Surgical History:  Procedure Laterality Date  . BACK SURGERY  1991   L4-5  . CATARACT EXTRACTION W/ INTRAOCULAR LENS IMPLANT Bilateral   . EYE SURGERY    . JOINT REPLACEMENT    . KNEE ARTHROPLASTY Left 12/03/2015   Procedure: COMPUTER ASSISTED TOTAL KNEE ARTHROPLASTY;  Surgeon: Dereck Leep, MD;  Location: [facility] ORS;  Service: Orthopedics;  Laterality: Left;  . LUMBAR LAMINECTOMY/DECOMPRESSION MICRODISCECTOMY N/A 10/05/2017   Procedure: LUMBAR LAMINECTOMY/DECOMPRESSION MICRODISCECTOMY 3 LEVELS L2-5, CORPECTOMY;  Surgeon: Meade Maw, MD;  Location: [facility] ORS;  Service: Neurosurgery;  Laterality: N/A;  . SKIN CANCER EXCISION      melenoma on scalp    Family History  Problem Relation Age of Onset  . Stroke Father   . Heart attack Father   . Cancer Brother        prostate  . Coronary artery disease Brother   . Hypertension Brother     Social History:  reports that he has never smoked. He has never used smokeless tobacco. He reports that he does not drink alcohol or use drugs.  No Known Allergies  Medications:  I have reviewed the patient's current medications. Prior to Admission:  Medications Prior to Admission  Medication Sig Dispense Refill Last Dose  . Acetaminophen 500 MG coapsule Take 2 capsules by mouth every 6 (six) hours as needed for pain.    prn at prn  . Amino Acids (FREE FORM AMINO ACID COMPLEX PO) Take 1 capsule by mouth 2 (two) times daily.    11/18/2017 at Unknown time  . Ascorbic Acid (VITAMIN C) 500 MG tablet Take 500 mg by mouth 2 (two) times daily.    11/18/2017 at Unknown time  . B Complex Vitamins (B COMPLEX PO) Take 1 tablet by mouth 2 (two) times daily.    11/18/2017 at Unknown time  . baclofen (LIORESAL) 10 MG tablet Take 10 mg by mouth 3 (three) times daily as needed for muscle spasms.   0  prn at prn  . Calcium-Magnesium-Vitamin D (CALCIUM 500 PO) Take 2 tablets by mouth daily.   11/18/2017 at Unknown time  . Cyanocobalamin (VITAMIN B-12) 1000 MCG SUBL Place 1 tablet under the tongue daily.    11/18/2017 at Unknown time  . diclofenac sodium (VOLTAREN) 1 % GEL Apply 2 g topically 3 (three) times daily as needed (pain).    prn at prn  . escitalopram (LEXAPRO) 20 MG tablet Take 1 tablet (20 mg total) by mouth every morning. (Patient taking differently: Take 10 mg by mouth every morning. ) 90 tablet 1 Past Week at Unknown time  . furosemide (LASIX) 20 MG tablet Take 20 mg by mouth daily as needed for fluid.    prn at prn  . L-Methylfolate-Algae (DEPLIN 15) 15-90.314 MG CAPS Take 15 mg by mouth every morning. 28 capsule 0 11/18/2017 at Unknown time  . loperamide (IMODIUM) 2 MG capsule Take 2 mg by mouth  as needed for diarrhea or loose stools.   prn at prn  . MELATONIN PO Take 2 tablets by mouth at bedtime. Restful Sleep by [facility] [place] : Hops Extract 4:1 (Humulus lupulus) (flower) Chamomile (Matricaria chamomilla) (flower) Passion Flower Extract (standardized to 4% vitexin) (Passiflora incarnate) (flower) 5 HTP (5-Hydroxtryptophan) (Griffonia simplicifolia) (seed)   04/17/1094 at Unknown time  . metoprolol succinate (TOPROL-XL) 100 MG 24 hr tablet Take 1 tablet (100 mg total) by mouth daily. Take with or immediately following a meal. 90 tablet 3 11/18/2017 at Unknown time  . Multiple Vitamin (MULTIVITAMIN) capsule Take 1 capsule by mouth daily.   11/18/2017 at Unknown time  . oxyCODONE (ROXICODONE) 5 MG immediate release tablet Take 1 tablet (5 mg total) by mouth every 6 (six) hours as needed for breakthrough pain. 20 tablet 0 prn at prn  . sacubitril-valsartan (ENTRESTO) 24-26 MG Take 1 tablet by mouth 2 (two) times daily. 60 tablet 3 11/18/2017 at Unknown time  . Specialty Vitamins Products (ICAPS LUTEIN-ZEAXANTHIN PO) Take 2 capsules by mouth daily.   11/18/2017 at Unknown time  . Specialty Vitamins Products (PROSTATE PO) Take 1 tablet by mouth daily.    11/18/2017 at Unknown time  . warfarin (COUMADIN) 5 MG tablet Take 2.5-5 mg by mouth See admin instructions. Takes 5mg  every 5 days. 2.5 mg every other day.  1 11/18/2017 at Unknown time   Scheduled: . docusate sodium  100 mg Oral BID  . metoprolol succinate  100 mg Oral Daily  . warfarin  3 mg Oral q1800  . Warfarin - Pharmacist Dosing Inpatient   Does not apply q1800    ROS: History obtained from the patient  General ROS: negative for - chills, fatigue, fever, night sweats, weight gain or weight loss Psychological ROS: negative for - behavioral disorder, hallucinations, memory difficulties, mood swings or suicidal ideation Ophthalmic ROS: negative for - blurry vision, double vision, eye pain or loss of vision ENT ROS: negative for - epistaxis, nasal  discharge, oral lesions, sore throat, tinnitus or vertigo Allergy and Immunology ROS: negative for - hives or itchy/watery eyes Hematological and Lymphatic ROS: negative for - bleeding problems, bruising or swollen lymph nodes Endocrine ROS: negative for - galactorrhea, hair pattern changes, polydipsia/polyuria or temperature intolerance Respiratory ROS: negative for - cough, hemoptysis, shortness of breath or wheezing Cardiovascular ROS: negative for - chest pain, dyspnea on exertion, edema or irregular heartbeat Gastrointestinal ROS: negative for - abdominal pain, diarrhea, hematemesis, nausea/vomiting or stool incontinence Genito-Urinary ROS: negative for - dysuria, hematuria, incontinence or urinary frequency/urgency Musculoskeletal  ROS: back pain Neurological ROS: as noted in HPI Dermatological ROS: negative for rash and skin lesion changes  Physical Examination: Blood pressure 135/88, pulse 90, temperature 98.1 F (36.7 C), temperature source Oral, resp. rate 13, weight 71 kg, SpO2 95 %.  HEENT-  Normocephalic, no lesions, without obvious abnormality.  Normal external eye and conjunctiva.  Normal TM's bilaterally.  Normal auditory canals and external ears. Normal external nose, mucus membranes and septum.  Normal pharynx. Cardiovascular- S1, S2 normal, pulses palpable throughout   Lungs- chest clear, no wheezing, rales, normal symmetric air entry Abdomen- soft, non-tender; bowel sounds normal; no masses,  no organomegaly Extremities- no edema Lymph-no adenopathy palpable Musculoskeletal-no joint tenderness, deformity or swelling Skin-warm and dry, no hyperpigmentation, vitiligo, or suspicious lesions  Neurological Examination   Mental Status: Alert, oriented to person, place and month.  Reports it is Saturday and 2011.  Knows President is Trump.  Speech fluent without evidence of aphasia.  Able to follow 3 step commands without difficulty. Cranial Nerves: II: Discs flat  bilaterally; Visual fields grossly normal, pupils equal, round, reactive to light and accommodation III,IV, VI: ptosis not present, extra-ocular motions intact bilaterally V,VII: smile symmetric, facial light touch sensation normal bilaterally VIII: hearing normal bilaterally IX,X: gag reflex present XI: bilateral shoulder shrug XII: midline tongue extension Motor: Right : Upper extremity   5/5    Left:     Upper extremity   5/5  Lower extremity   5/5     Lower extremity   5/5 Tone and bulk:normal tone throughout; no atrophy noted Sensory: Pinprick and light touch intact throughout, bilaterally Deep Tendon Reflexes: 2+ and symmetric with absent AJ's bilaterally Plantars: Right: mute   Left: mute Cerebellar: Normal finger-to-nose and normal heel-to-shin testing bilaterally Gait: not tested due to safety concerns   Laboratory Studies:   Basic Metabolic Panel: Recent Labs  Lab 11/19/17 0104 11/20/17 0443  NA 138 140  K 4.4 4.3  CL 108 107  CO2 21* 24  GLUCOSE 138* 89  BUN 35* 32*  CREATININE 1.64* 1.82*  CALCIUM 8.7* 9.1    Liver Function Tests: Recent Labs  Lab 11/19/17 0104  AST 29  ALT 21  ALKPHOS 69  BILITOT 1.0  PROT 6.8  ALBUMIN 3.6   Recent Labs  Lab 11/19/17 0104  LIPASE 46   Recent Labs  Lab 11/20/17 1157  AMMONIA 12    CBC: Recent Labs  Lab 11/19/17 0034  WBC 8.4  NEUTROABS 6.1  HGB 13.9  HCT 40.6  MCV 100.7*  PLT 143*    Cardiac Enzymes: Recent Labs  Lab 11/19/17 0104  TROPONINI <0.03    BNP: Invalid input(s): POCBNP  CBG: No results for input(s): GLUCAP in the last 168 hours.  Microbiology: Results for orders placed or performed during the hospital encounter of 09/30/17  Surgical pcr screen     Status: None   Collection Time: 09/30/17  2:22 PM  Result Value Ref Range Status   MRSA, PCR NEGATIVE NEGATIVE Final   Staphylococcus aureus NEGATIVE NEGATIVE Final    Comment: (NOTE) The Xpert SA Assay (FDA approved for NASAL  specimens in patients 4 years of age and older), is one component of a comprehensive surveillance program. It is not intended to diagnose infection nor to guide or monitor treatment. Performed at [facility], [address], [place], [place] [address]     Coagulation Studies: Recent Labs    11/19/17 4166 11/20/17 0443  LABPROT 26.3* 25.4*  INR  2.44 2.33    Urinalysis:  Recent Labs  Lab 11/19/17 0238  COLORURINE YELLOW*  LABSPEC 1.014  PHURINE 5.0  GLUCOSEU NEGATIVE  HGBUR NEGATIVE  BILIRUBINUR NEGATIVE  KETONESUR NEGATIVE  PROTEINUR 30*  NITRITE NEGATIVE  LEUKOCYTESUR NEGATIVE    Lipid Panel:     Component Value Date/Time   CHOL 171 02/06/2016 1157   TRIG 95.0 02/06/2016 1157   HDL 46.40 02/06/2016 1157   CHOLHDL 4 02/06/2016 1157   VLDL 19.0 02/06/2016 1157   LDLCALC 106 (H) 02/06/2016 1157    HgbA1C:  Lab Results  Component Value Date   HGBA1C  11/12/2009    5.3 (NOTE)                                                                       According to the ADA Clinical Practice Recommendations for 2011, when HbA1c is used as a screening test:   >=6.5%   Diagnostic of Diabetes Mellitus           (if abnormal result  is confirmed)  5.7-6.4%   Increased risk of developing Diabetes Mellitus  References:Diagnosis and Classification of Diabetes Mellitus,Diabetes ZOXW,9604,54(UJWJX 1):S62-S69 and Standards of Medical Care in         Diabetes - 2011,Diabetes Care,2011,34  (Suppl 1):S11-S61.    Urine Drug Screen:      Component Value Date/Time   LABOPIA NEGATIVE 11/12/2009 0632   COCAINSCRNUR NEGATIVE 11/12/2009 0632   LABBENZ NEGATIVE 11/12/2009 0632   AMPHETMU NEGATIVE 11/12/2009 9147    Alcohol Level: No results for input(s): ETH in the last 168 hours.  Other results: EKG: atrial fibrillation, rate 105 bpm.  Imaging: Ct Head Wo Contrast  Result Date: 11/19/2017 CLINICAL DATA:  Acute onset of vertigo, nausea and vomiting. Hypotension. EXAM:  CT HEAD WITHOUT CONTRAST TECHNIQUE: Contiguous axial images were obtained from the base of the skull through the vertex without intravenous contrast. COMPARISON:  CT of the head performed 04/26/2017, and MRI of the brain performed 11/12/2009 FINDINGS: Brain: No evidence of acute infarction, hemorrhage, hydrocephalus, extra-axial collection or mass lesion / mass effect. Prominence of the ventricles and sulci reflects moderate cortical volume loss. Cerebellar atrophy is noted. Scattered periventricular and subcortical white matter change likely reflects small vessel ischemic microangiopathy. A chronic infarct is noted at the left external capsule. The brainstem and fourth ventricle are within normal limits. The cerebral hemispheres demonstrate grossly normal gray-white differentiation. No mass effect or midline shift is seen. Vascular: No hyperdense vessel or unexpected calcification. Skull: There is no evidence of fracture; visualized osseous structures are unremarkable in appearance. Sinuses/Orbits: The visualized portions of the orbits are within normal limits. The paranasal sinuses and mastoid air cells are well-aerated. Other: No significant soft tissue abnormalities are seen. IMPRESSION: 1. No acute intracranial pathology seen on CT. 2. Moderate cortical volume loss and scattered small vessel ischemic microangiopathy. 3. 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 Electronically Signed   By: Garald Balding M.D.   On: 11/19/2017 01:06   Mr Brain Wo Contrast (neuro Protocol)  Result Date: 11/19/2017 CLINICAL DATA:  Persistent central vertigo. EXAM: MRI HEAD WITHOUT CONTRAST TECHNIQUE: Multiplanar, multiecho pulse sequences of the brain and surrounding structures were obtained without intravenous contrast. COMPARISON:  Head  CT 11/19/2017 Brain MRI 11/04/2014 FINDINGS: BRAIN: There is no acute infarct, acute hemorrhage or mass effect. The midline structures are normal. Old left external capsule infarct. Early  confluent hyperintense T2-weighted signal of the periventricular and deep white matter, most commonly due to chronic ischemic microangiopathy. Generalized atrophy without lobar predilection. Susceptibility-sensitive sequences show no chronic microhemorrhage or superficial siderosis. VASCULAR: Major intracranial arterial and venous sinus flow voids are preserved. SKULL AND UPPER CERVICAL SPINE: The visualized skull base, calvarium, upper cervical spine and extracranial soft tissues are normal. SINUSES/ORBITS: No fluid levels or advanced mucosal thickening. No mastoid or middle ear effusion. The orbits are normal. IMPRESSION: 1. No acute intracranial abnormality. 2. Old left external capsule small vessel infarct and sequelae of chronic microvascular ischemia. 3. Generalized atrophy. Electronically Signed   By: Ulyses Jarred M.D.   On: 11/19/2017 01:54   US Renal  Result Date: 11/20/2017 CLINICAL DATA:  Acute kidney injury EXAM: RENAL / URINARY TRACT ULTRASOUND COMPLETE COMPARISON:  Abdomen and pelvis CT 02/17/2016 FINDINGS: Right Kidney: Length: 10 cm. Diffuse increased cortical echogenicity with mild thinning. There is multiple cysts measuring up to 2.8 cm, simple appearing. No hydronephrosis or evidence solid mass Left Kidney: Length: 10 cm. Diffuse increased cortical echogenicity with mild thinning. Multiple cysts, at least 5, measuring up to 4.4 cm. None show concerning complexity. Renal cysts were characterized by renal CT in 2017. Bladder: Appears normal for degree of bladder distention. IMPRESSION: 1. Medical renal disease without acute finding or hydronephrosis. 2. Known renal cysts. Electronically Signed   By: Monte Fantasia M.D.   On: 11/20/2017 12:13   Dg Abd 2 Views  Result Date: 11/19/2017 CLINICAL DATA:  Patient poor historian at this time. Encounter for emesis. Hx kidney stones. EXAM: ABDOMEN - 2 VIEW COMPARISON:  CT 02/17/2016 FINDINGS: Coarse bibasilar interstitial markings left worse than  right. No pleural effusion. Heart size upper limits normal. No free air. Normal bowel gas pattern. Pelvic phleboliths and vascular calcifications. No unexplained abdominal calcifications. Spondylitic changes in the lower lumbar spine. IMPRESSION: 1. Normal bowel gas pattern.  No free air. Electronically Signed   By: Lucrezia Europe M.D.   On: 11/19/2017 09:52     Assessment/Plan: 84 year old male presenting with altered mental status.  Appears to have a mild dementia at baseline.  Mental status likely worsened with start of Baclofen and worsening of renal function.  Baclofen has been discontinued.  MRI of the brain reviewed and shows no acute changes.  Patient on Coumadin.  INR therapeutic.    Recommendations: 1. Agree with discontinuation of Baclofen 2. Agree with addressing renal function 3. Agree with continuation of Coumadin  No further neurologic intervention is recommended at this time.  If further questions arise, please call or page at that time.  Thank you for allowing neurology to participate in the care of this patient.   Alexis Goodell, MD Neurology [phone] 11/20/2017, 1:38 PM

## 2017-11-20 NOTE — Consult Note (Signed)
[facility]  CONSULT NOTE    Date: 11/20/2017                  Patient Name:  David Duncan  MRN: [id number]  DOB: 09-02-1930  Age / Sex: 84 y.o., male         PCP: Abner Greenspan, MD                 Service Requesting Consult: Dr. Jerelyn Charles                 Reason for Consult: Acute renal failure            History of Present Illness: Mr. David Duncan is a 84 y.o. white male with bipolar disorder, hypertension, systolic congestive heart failure, atrial fibrillation on warfarin, CVA, tremor, GERD, who was admitted to [facility] on 11/19/2017 for Dizziness [R42] Delirium [R41.0] Non-intractable vomiting with nausea, unspecified vomiting type [R11.2]  Patient had lumbar back surgery on 7/26 by Dr. Izora Ribas. He was having muscle spasms and then on 8/1 he was started on baclofen.   Wife states that his when his symptoms began with confusion and nausea/vomiting.   Nephrology consutled for rising creatinine.   History taken from family at bedside. Patient slow to answer questions.    Medications: Outpatient medications: Medications Prior to Admission  Medication Sig Dispense Refill Last Dose  . Acetaminophen 500 MG coapsule Take 2 capsules by mouth every 6 (six) hours as needed for pain.    prn at prn  . Amino Acids (FREE FORM AMINO ACID COMPLEX PO) Take 1 capsule by mouth 2 (two) times daily.    11/18/2017 at Unknown time  . Ascorbic Acid (VITAMIN C) 500 MG tablet Take 500 mg by mouth 2 (two) times daily.    11/18/2017 at Unknown time  . B Complex Vitamins (B COMPLEX PO) Take 1 tablet by mouth 2 (two) times daily.    11/18/2017 at Unknown time  . baclofen (LIORESAL) 10 MG tablet Take 10 mg by mouth 3 (three) times daily as needed for muscle spasms.   0 prn at prn  . Calcium-Magnesium-Vitamin D (CALCIUM 500 PO) Take 2 tablets by mouth daily.   11/18/2017 at Unknown time  . Cyanocobalamin (VITAMIN B-12) 1000 MCG SUBL Place 1 tablet under the tongue daily.    11/18/2017  at Unknown time  . diclofenac sodium (VOLTAREN) 1 % GEL Apply 2 g topically 3 (three) times daily as needed (pain).    prn at prn  . escitalopram (LEXAPRO) 20 MG tablet Take 1 tablet (20 mg total) by mouth every morning. (Patient taking differently: Take 10 mg by mouth every morning. ) 90 tablet 1 Past Week at Unknown time  . furosemide (LASIX) 20 MG tablet Take 20 mg by mouth daily as needed for fluid.    prn at prn  . L-Methylfolate-Algae (DEPLIN 15) 15-90.314 MG CAPS Take 15 mg by mouth every morning. 28 capsule 0 11/18/2017 at Unknown time  . loperamide (IMODIUM) 2 MG capsule Take 2 mg by mouth as needed for diarrhea or loose stools.   prn at prn  . MELATONIN PO Take 2 tablets by mouth at bedtime. Restful Sleep by [facility] : Hops Extract 4:1 (Humulus lupulus) (flower) Chamomile (Matricaria chamomilla) (flower) Passion Flower Extract (standardized to 4% vitexin) (Passiflora incarnate) (flower) 5 HTP (5-Hydroxtryptophan) (Griffonia simplicifolia) (seed)   09/16/3417 at Unknown time  . metoprolol succinate (TOPROL-XL) 100 MG 24 hr tablet Take 1 tablet (  100 mg total) by mouth daily. Take with or immediately following a meal. 90 tablet 3 11/18/2017 at Unknown time  . Multiple Vitamin (MULTIVITAMIN) capsule Take 1 capsule by mouth daily.   11/18/2017 at Unknown time  . oxyCODONE (ROXICODONE) 5 MG immediate release tablet Take 1 tablet (5 mg total) by mouth every 6 (six) hours as needed for breakthrough pain. 20 tablet 0 prn at prn  . sacubitril-valsartan (ENTRESTO) 24-26 MG Take 1 tablet by mouth 2 (two) times daily. 60 tablet 3 11/18/2017 at Unknown time  . Specialty Vitamins Products (ICAPS LUTEIN-ZEAXANTHIN PO) Take 2 capsules by mouth daily.   11/18/2017 at Unknown time  . Specialty Vitamins Products (PROSTATE PO) Take 1 tablet by mouth daily.    11/18/2017 at Unknown time  . warfarin (COUMADIN) 5 MG tablet Take 2.5-5 mg by mouth See admin instructions. Takes 5mg  every 5 days. 2.5 mg every other day.  1 11/18/2017 at  Unknown time    Current medications: Current Facility-Administered Medications  Medication Dose Route Frequency Provider Last Rate Last Dose  . 0.45 % sodium chloride infusion   Intravenous Continuous Salary, Montell D, MD      . acetaminophen (TYLENOL) tablet 650 mg  650 mg Oral Q6H PRN Harrie Foreman, MD       Or  . acetaminophen (TYLENOL) suppository 650 mg  650 mg Rectal Q6H PRN Harrie Foreman, MD      . baclofen (LIORESAL) tablet 10 mg  10 mg Oral TID PRN Harrie Foreman, MD      . docusate sodium (COLACE) capsule 100 mg  100 mg Oral BID Harrie Foreman, MD   100 mg at 11/19/17 2035  . haloperidol lactate (HALDOL) injection 5 mg  5 mg Intramuscular Q6H PRN Salary, Holly Bodily D, MD   5 mg at 11/19/17 1059  . metoprolol succinate (TOPROL-XL) 24 hr tablet 100 mg  100 mg Oral Daily Harrie Foreman, MD      . ondansetron [facility]) tablet 4 mg  4 mg Oral Q6H PRN Harrie Foreman, MD       Or  . ondansetron [facility]) injection 4 mg  4 mg Intravenous Q6H PRN Harrie Foreman, MD      . QUEtiapine (SEROQUEL) tablet 25 mg  25 mg Oral QHS PRN Lance Coon, MD      . warfarin (COUMADIN) tablet 3 mg  3 mg Oral q1800 Harrie Foreman, MD   3 mg at 11/19/17 1757  . Warfarin - Pharmacist Dosing Inpatient   Does not apply q1800 Ramond Dial, RPH          Allergies: No Known Allergies    Past Medical History: Past Medical History:  Diagnosis Date  . Anxiety   . Arrhythmia   . Atrial fibrillation ([place])   . Basal cell cancer 2008   on shoulder  . Bipolar disorder [facility])    hospitalized in past  . Cardiomyopathy ([place])   . CHF (congestive heart failure) ([place])   . CVA (cerebral vascular accident) ([place]) 8/11  . Depression   . Dyslipidemia   . Generalized anxiety disorder   . GERD (gastroesophageal reflux disease)   . History of kidney stones    30 years ago  . Hypothyroidism   . Macular degeneration   . Melanoma ([place])    resected from scalp approximately 1 year  ago.   . Osteoarthritis    in neck and left knee  . Tremor  noted when writing  . Valvular heart disease      Past Surgical History: Past Surgical History:  Procedure Laterality Date  . BACK SURGERY  1991   L4-5  . CATARACT EXTRACTION W/ INTRAOCULAR LENS IMPLANT Bilateral   . EYE SURGERY    . JOINT REPLACEMENT    . KNEE ARTHROPLASTY Left 12/03/2015   Procedure: COMPUTER ASSISTED TOTAL KNEE ARTHROPLASTY;  Surgeon: Dereck Leep, MD;  Location: [facility] ORS;  Service: Orthopedics;  Laterality: Left;  . LUMBAR LAMINECTOMY/DECOMPRESSION MICRODISCECTOMY N/A 10/05/2017   Procedure: LUMBAR LAMINECTOMY/DECOMPRESSION MICRODISCECTOMY 3 LEVELS L2-5, CORPECTOMY;  Surgeon: Meade Maw, MD;  Location: [facility] ORS;  Service: Neurosurgery;  Laterality: N/A;  . SKIN CANCER EXCISION     melenoma on scalp     Family History: Family History  Problem Relation Age of Onset  . Stroke Father   . Heart attack Father   . Cancer Brother        prostate  . Coronary artery disease Brother   . Hypertension Brother      Social History: Social History   Socioeconomic History  . Marital status: Married    Spouse name: Not on file  . Number of children: Not on file  . Years of education: Not on file  . Highest education level: Not on file  Occupational History  . Occupation: retired    Comment: used to work on farm, Press photographer, Cabin crew  Social Needs  . Financial resource strain: Not on file  . Food insecurity:    Worry: Not on file    Inability: Not on file  . Transportation needs:    Medical: Not on file    Non-medical: Not on file  Tobacco Use  . Smoking status: Never Smoker  . Smokeless tobacco: Never Used  Substance and Sexual Activity  . Alcohol use: No    Alcohol/week: 0.0 standard drinks  . Drug use: No  . Sexual activity: Never    Birth control/protection: None  Lifestyle  . Physical activity:    Days per week: Not on file    Minutes per session: Not on file  . Stress:  Not on file  Relationships  . Social connections:    Talks on phone: Not on file    Gets together: Not on file    Attends religious service: Not on file    Active member of club or organization: Not on file    Attends meetings of clubs or organizations: Not on file    Relationship status: Not on file  . Intimate partner violence:    Fear of current or ex partner: Not on file    Emotionally abused: Not on file    Physically abused: Not on file    Forced sexual activity: Not on file  Other Topics Concern  . Not on file  Social History Narrative   Moved from [place] 3/10   Walks 30-40 minutes daily     Review of Systems: Review of Systems  Unable to perform ROS: Mental status change    Vital Signs: Blood pressure 135/88, pulse 90, temperature 98.1 F (36.7 C), temperature source Oral, resp. rate 13, weight 71 kg, SpO2 95 %.  Weight trends: Filed Weights   11/19/17 0034 11/19/17 0505 11/20/17 0100  Weight: 72.6 kg 73.9 kg 71 kg    Physical Exam: General: NAD, laying in bed  Head: Normocephalic, atraumatic. Moist oral mucosal membranes  Eyes: Anicteric, PERRL  Neck: Supple, trachea midline  Lungs:  Clear to  auscultation  Heart: irregular  Abdomen:  Soft, nontender,   Extremities:  no peripheral edema.  Neurologic: Nonfocal, moving all four extremities  Skin: No lesions         Lab results: Basic Metabolic Panel: Recent Labs  Lab 11/19/17 0104 11/20/17 0443  NA 138 140  K 4.4 4.3  CL 108 107  CO2 21* 24  GLUCOSE 138* 89  BUN 35* 32*  CREATININE 1.64* 1.82*  CALCIUM 8.7* 9.1    Liver Function Tests: Recent Labs  Lab 11/19/17 0104  AST 29  ALT 21  ALKPHOS 69  BILITOT 1.0  PROT 6.8  ALBUMIN 3.6   Recent Labs  Lab 11/19/17 0104  LIPASE 46   No results for input(s): AMMONIA in the last 168 hours.  CBC: Recent Labs  Lab 11/19/17 0034  WBC 8.4  NEUTROABS 6.1  HGB 13.9  HCT 40.6  MCV 100.7*  PLT 143*    Cardiac Enzymes: Recent Labs   Lab 11/19/17 0104  TROPONINI <0.03    BNP: Invalid input(s): POCBNP  CBG: No results for input(s): GLUCAP in the last 168 hours.  Microbiology: Results for orders placed or performed during the hospital encounter of 09/30/17  Surgical pcr screen     Status: None   Collection Time: 09/30/17  2:22 PM  Result Value Ref Range Status   MRSA, PCR NEGATIVE NEGATIVE Final   Staphylococcus aureus NEGATIVE NEGATIVE Final    Comment: (NOTE) The Xpert SA Assay (FDA approved for NASAL specimens in patients 54 years of age and older), is one component of a comprehensive surveillance program. It is not intended to diagnose infection nor to guide or monitor treatment. Performed at [facility], [address] [place], [place] [address]     Coagulation Studies: Recent Labs    11/19/17 8182 11/20/17 0443  LABPROT 26.3* 25.4*  INR 2.44 2.33    Urinalysis: Recent Labs    11/19/17 0238  COLORURINE YELLOW*  LABSPEC 1.014  PHURINE 5.0  GLUCOSEU NEGATIVE  HGBUR NEGATIVE  BILIRUBINUR NEGATIVE  KETONESUR NEGATIVE  PROTEINUR 30*  NITRITE NEGATIVE  LEUKOCYTESUR NEGATIVE      Imaging: Ct Head Wo Contrast  Result Date: 11/19/2017 CLINICAL DATA:  Acute onset of vertigo, nausea and vomiting. Hypotension. EXAM: CT HEAD WITHOUT CONTRAST TECHNIQUE: Contiguous axial images were obtained from the base of the skull through the vertex without intravenous contrast. COMPARISON:  CT of the head performed 04/26/2017, and MRI of the brain performed 11/12/2009 FINDINGS: Brain: No evidence of acute infarction, hemorrhage, hydrocephalus, extra-axial collection or mass lesion / mass effect. Prominence of the ventricles and sulci reflects moderate cortical volume loss. Cerebellar atrophy is noted. Scattered periventricular and subcortical white matter change likely reflects small vessel ischemic microangiopathy. A chronic infarct is noted at the left external capsule. The brainstem and fourth  ventricle are within normal limits. The cerebral hemispheres demonstrate grossly normal gray-white differentiation. No mass effect or midline shift is seen. Vascular: No hyperdense vessel or unexpected calcification. Skull: There is no evidence of fracture; visualized osseous structures are unremarkable in appearance. Sinuses/Orbits: The visualized portions of the orbits are within normal limits. The paranasal sinuses and mastoid air cells are well-aerated. Other: No significant soft tissue abnormalities are seen. IMPRESSION: 1. No acute intracranial pathology seen on CT. 2. Moderate cortical volume loss and scattered small vessel ischemic microangiopathy. 3. 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 Electronically Signed   By: Garald Balding M.D.   On: 11/19/2017 01:06  Mr Brain Wo Contrast (neuro Protocol)  Result Date: 11/19/2017 CLINICAL DATA:  Persistent central vertigo. EXAM: MRI HEAD WITHOUT CONTRAST TECHNIQUE: Multiplanar, multiecho pulse sequences of the brain and surrounding structures were obtained without intravenous contrast. COMPARISON:  Head CT 11/19/2017 Brain MRI 11/04/2014 FINDINGS: BRAIN: There is no acute infarct, acute hemorrhage or mass effect. The midline structures are normal. Old left external capsule infarct. Early confluent hyperintense T2-weighted signal of the periventricular and deep white matter, most commonly due to chronic ischemic microangiopathy. Generalized atrophy without lobar predilection. Susceptibility-sensitive sequences show no chronic microhemorrhage or superficial siderosis. VASCULAR: Major intracranial arterial and venous sinus flow voids are preserved. SKULL AND UPPER CERVICAL SPINE: The visualized skull base, calvarium, upper cervical spine and extracranial soft tissues are normal. SINUSES/ORBITS: No fluid levels or advanced mucosal thickening. No mastoid or middle ear effusion. The orbits are normal. IMPRESSION: 1. No acute intracranial abnormality. 2. Old  left external capsule small vessel infarct and sequelae of chronic microvascular ischemia. 3. Generalized atrophy. Electronically Signed   By: Ulyses Jarred M.D.   On: 11/19/2017 01:54   US Renal  Result Date: 11/20/2017 CLINICAL DATA:  Acute kidney injury EXAM: RENAL / URINARY TRACT ULTRASOUND COMPLETE COMPARISON:  Abdomen and pelvis CT 02/17/2016 FINDINGS: Right Kidney: Length: 10 cm. Diffuse increased cortical echogenicity with mild thinning. There is multiple cysts measuring up to 2.8 cm, simple appearing. No hydronephrosis or evidence solid mass Left Kidney: Length: 10 cm. Diffuse increased cortical echogenicity with mild thinning. Multiple cysts, at least 5, measuring up to 4.4 cm. None show concerning complexity. Renal cysts were characterized by renal CT in 2017. Bladder: Appears normal for degree of bladder distention. IMPRESSION: 1. Medical renal disease without acute finding or hydronephrosis. 2. Known renal cysts. Electronically Signed   By: Monte Fantasia M.D.   On: 11/20/2017 12:13   Dg Abd 2 Views  Result Date: 11/19/2017 CLINICAL DATA:  Patient poor historian at this time. Encounter for emesis. Hx kidney stones. EXAM: ABDOMEN - 2 VIEW COMPARISON:  CT 02/17/2016 FINDINGS: Coarse bibasilar interstitial markings left worse than right. No pleural effusion. Heart size upper limits normal. No free air. Normal bowel gas pattern. Pelvic phleboliths and vascular calcifications. No unexplained abdominal calcifications. Spondylitic changes in the lower lumbar spine. IMPRESSION: 1. Normal bowel gas pattern.  No free air. Electronically Signed   By: Lucrezia Europe M.D.   On: 11/19/2017 09:52      Assessment & Plan: Mr. DENARIO BAGOT is a 84 y.o. white male with bipolar disorder, hypertension, systolic congestive heart failure, atrial fibrillation on warfarin, CVA, tremor, GERD, who was admitted to [facility] on 11/19/2017 for Dizziness, Delirium and vomiting with nausea, unspecified vomiting   1. Acute  renal failure on chronic kidney disease stage III: baseline creatinine of 1.34, GFR of 47 on 04/26/17.  Acute renal failure secondary to prerenal azotemia Chronic kidney disease secondary to hypertension, congestive heart failure and remote lithium exposure. No history of NSAIDs or diabetes.  - Ultrasound reviewed.  - CKD education provided.  - Continue IV fluids: 1/2NS at 25mL/hr - Holding Entresto and furosemide.    2. Hypertension: with systolic congestive heart failure, EF 30% on 08/08/2017.  Atrial fibrillation on examination Blood pressure at goal. Holding furosemide and entresto - Continue metoprolol.   3. Encephalopathy: most likely secondary to baclofen. Which is renally cleared. Patient was taking 10mg  tid. For patient's renal function, recommended dose is 2.5mg  every 8 hours.  - discontinue baclofen. Continue work up  LOS: 0 Xion Debruyne 8/11/201912:38 PM

## 2017-11-21 DIAGNOSIS — N179 Acute kidney failure, unspecified: Secondary | ICD-10-CM | POA: Diagnosis not present

## 2017-11-21 DIAGNOSIS — R410 Disorientation, unspecified: Secondary | ICD-10-CM | POA: Diagnosis not present

## 2017-11-21 DIAGNOSIS — I4891 Unspecified atrial fibrillation: Secondary | ICD-10-CM | POA: Diagnosis not present

## 2017-11-21 DIAGNOSIS — I5022 Chronic systolic (congestive) heart failure: Secondary | ICD-10-CM | POA: Diagnosis not present

## 2017-11-21 LAB — BASIC METABOLIC PANEL
ANION GAP: 9 (ref 5–15)
BUN: 26 mg/dL — ABNORMAL HIGH (ref 8–23)
CALCIUM: 8.9 mg/dL (ref 8.9–10.3)
CO2: 23 mmol/L (ref 22–32)
Chloride: 106 mmol/L (ref 98–111)
Creatinine, Ser: 1.58 mg/dL — ABNORMAL HIGH (ref 0.61–1.24)
GFR, EST AFRICAN AMERICAN: 44 mL/min — AB (ref >60)
GFR, EST NON AFRICAN AMERICAN: 38 mL/min — AB (ref >60)
GLUCOSE: 95 mg/dL (ref 70–99)
POTASSIUM: 4 mmol/L (ref 3.5–5.1)
Sodium: 138 mmol/L (ref 135–145)

## 2017-11-21 LAB — PROTIME-INR
INR: 2.7
PROTHROMBIN TIME: 28.5 s — AB (ref 11.4–15.2)

## 2017-11-21 MED ORDER — ESCITALOPRAM OXALATE 10 MG PO TABS
10.0000 mg | ORAL_TABLET | Freq: Every day | ORAL | Status: DC
  Administered 2017-11-21 – 2017-11-22 (×2): 10 mg via ORAL
  Filled 2017-11-21 (×2): qty 1

## 2017-11-21 MED ORDER — SACUBITRIL-VALSARTAN 24-26 MG PO TABS
1.0000 | ORAL_TABLET | Freq: Two times a day (BID) | ORAL | Status: DC
  Administered 2017-11-21 – 2017-11-22 (×3): 1 via ORAL
  Filled 2017-11-21 (×4): qty 1

## 2017-11-21 MED ORDER — POLYETHYLENE GLYCOL 3350 17 G PO PACK
17.0000 g | PACK | Freq: Every day | ORAL | Status: DC
  Administered 2017-11-21 – 2017-11-22 (×2): 17 g via ORAL
  Filled 2017-11-21 (×2): qty 1

## 2017-11-21 MED ORDER — BISACODYL 5 MG PO TBEC
10.0000 mg | DELAYED_RELEASE_TABLET | Freq: Every day | ORAL | Status: DC | PRN
  Administered 2017-11-22: 10:00:00 10 mg via ORAL
  Filled 2017-11-21: qty 2

## 2017-11-21 MED ORDER — SENNA 8.6 MG PO TABS
1.0000 | ORAL_TABLET | Freq: Every day | ORAL | Status: DC
  Administered 2017-11-21: 21:00:00 8.6 mg via ORAL
  Filled 2017-11-21: qty 1

## 2017-11-21 NOTE — Clinical Social Work Note (Signed)
CSW gave bed offers to patient. Patient has chosen [facility] for [employer]. CSW informed Seth Bake at [facility] of bed acceptance. CSW will start [employer] authorization. CSW will continue to follow for discharge planning.   [place], [place]

## 2017-11-21 NOTE — Clinical Social Work Note (Signed)
Clinical Social Work Assessment  Patient Details  Name: David Duncan MRN: [id number] Date of Birth: 10-13-1930  Date of referral:  11/21/17               Reason for consult:  Facility Placement                Permission sought to share information with:  Case Manager, Customer service manager, Family Supports Permission granted to share information::  Yes, Verbal Permission Granted  Name::      SNF  Agency::   [place]   Relationship::     Contact Information:     Housing/Transportation Living arrangements for the past 2 months:  [place] of Information:  Patient Patient Interpreter Needed:  None Criminal Activity/Legal Involvement Pertinent to Current Situation/Hospitalization:  No - Comment as needed Significant Relationships:  Spouse Lives with:  Spouse Do you feel safe going back to the place where you live?  Yes Need for family participation in patient care:  Yes (Comment)  Care giving concerns:  Patient lives with his wife    Facilities manager / plan:  CSW consulted for SNF placement. CSW met with patient to discuss discharge plan. CSW introduced self and explained role. Patient states that he lives with his wife. CSW explained PT recommendation of SNF for rehab. Patient states that he has been to SNF before post knee surgery but he was unable to give the name of the facility. CSW explained bed search process. Patient is agreeable to go to SNF and to bed search. CSW will initiate bed search and give bed offers once received. CSW will continue to follow for discharge planning.   Employment status:  Retired Nurse, adult PT Recommendations:  [place] / Referral to community resources:  [place]  Patient/Family's Response to care:  Patient thanked CSW for assistance   Patient/Family's Understanding of and Emotional Response to Diagnosis, Current Treatment, and  Prognosis: Patient is agreeable to SNF placement   Emotional Assessment Appearance:  Appears stated age Attitude/Demeanor/Rapport:    Affect (typically observed):  Accepting, Pleasant Orientation:  Oriented to Self, Oriented to Place, Oriented to  Time Alcohol / Substance use:  Not Applicable Psych involvement (Current and /or in the community):  No (Comment)  Discharge Needs  Concerns to be addressed:  Discharge Planning Concerns Readmission within the last 30 days:  No Current discharge risk:  None Barriers to Discharge:  Continued Medical Work up   [employer], [place] 11/21/2017, 9:58 AM

## 2017-11-21 NOTE — Progress Notes (Signed)
[facility] [place] NAME: David Duncan    MR#:  [id number]  DATE OF BIRTH:  [birth year]  SUBJECTIVE:  CHIEF COMPLAINT:   Chief Complaint  Patient presents with  . Emesis   -Complains of generalized weakness.  Mental status is back to baseline. -Also constipated  REVIEW OF SYSTEMS:  Review of Systems  Constitutional: Positive for malaise/fatigue. Negative for chills and fever.  HENT: Positive for hearing loss. Negative for congestion, ear discharge and nosebleeds.   Eyes: Negative for blurred vision and double vision.  Respiratory: Negative for cough, shortness of breath and wheezing.   Cardiovascular: Negative for chest pain, palpitations and leg swelling.  Gastrointestinal: Positive for constipation. Negative for abdominal pain, diarrhea, nausea and vomiting.  Genitourinary: Negative for dysuria.  Musculoskeletal: Negative for myalgias.  Neurological: Negative for dizziness, focal weakness, seizures, weakness and headaches.  Psychiatric/Behavioral: Negative for depression.    DRUG ALLERGIES:  No Known Allergies  VITALS:  Blood pressure (!) 151/98, pulse (!) 110, temperature 98.1 F (36.7 C), temperature source Oral, resp. rate 18, weight 73.6 kg, SpO2 96 %.  PHYSICAL EXAMINATION:  Physical Exam   GENERAL:  84 y.o.-year-old elderly patient lying in the bed with no acute distress.  EYES: Pupils equal, round, reactive to light and accommodation. No scleral icterus. Extraocular muscles intact.  HEENT: Head atraumatic, normocephalic. Oropharynx and nasopharynx clear.  NECK:  Supple, no jugular venous distention. No thyroid enlargement, no tenderness.  LUNGS: Normal breath sounds bilaterally, no wheezing, rales,rhonchi or crepitation. No use of accessory muscles of respiration.  Decreased bibasilar breath sounds CARDIOVASCULAR: S1, S2 normal. No  rubs, or gallops.  2/6 systolic murmur present ABDOMEN: Soft, nontender,  nondistended. Bowel sounds present. No organomegaly or mass.  EXTREMITIES: No pedal edema, cyanosis, or clubbing.  NEUROLOGIC: Cranial nerves II through XII are intact. Muscle strength 5/5 in all extremities. Sensation intact. Gait not checked.  Global weakness noted PSYCHIATRIC: The patient is alert and oriented x 3.  SKIN: No obvious rash, lesion, or ulcer.    LABORATORY PANEL:   CBC Recent Labs  Lab 11/19/17 0034  WBC 8.4  HGB 13.9  HCT 40.6  PLT 143*   ------------------------------------------------------------------------------------------------------------------  Chemistries  Recent Labs  Lab 11/19/17 0104  11/21/17 0432  NA 138   < > 138  K 4.4   < > 4.0  CL 108   < > 106  CO2 21*   < > 23  GLUCOSE 138*   < > 95  BUN 35*   < > 26*  CREATININE 1.64*   < > 1.58*  CALCIUM 8.7*   < > 8.9  AST 29  --   --   ALT 21  --   --   ALKPHOS 69  --   --   BILITOT 1.0  --   --    < > = values in this interval not displayed.   ------------------------------------------------------------------------------------------------------------------  Cardiac Enzymes Recent Labs  Lab 11/19/17 0104  TROPONINI <0.03   ------------------------------------------------------------------------------------------------------------------  RADIOLOGY:  US Renal  Result Date: 11/20/2017 CLINICAL DATA:  Acute kidney injury EXAM: RENAL / URINARY TRACT ULTRASOUND COMPLETE COMPARISON:  Abdomen and pelvis CT 02/17/2016 FINDINGS: Right Kidney: Length: 10 cm. Diffuse increased cortical echogenicity with mild thinning. There is multiple cysts measuring up to 2.8 cm, simple appearing. No hydronephrosis or evidence solid mass Left Kidney: Length: 10 cm. Diffuse increased cortical echogenicity with mild thinning. Multiple cysts, at least 5, measuring  up to 4.4 cm. None show concerning complexity. Renal cysts were characterized by renal CT in 2017. Bladder: Appears normal for degree of bladder distention.  IMPRESSION: 1. Medical renal disease without acute finding or hydronephrosis. 2. Known renal cysts. Electronically Signed   By: Monte Fantasia M.D.   On: 11/20/2017 12:13    EKG:   Orders placed or performed during the hospital encounter of 11/19/17  . ED EKG  . ED EKG  . EKG 12-Lead  . EKG 12-Lead  . EKG 12-Lead  . EKG 12-Lead  . EKG    ASSESSMENT AND PLAN:   84 year old male with past medical history significant for hypertension, CHF, history of CVA, A. fib on warfarin, bipolar disorder presents to hospital from home secondary to dizziness and delirium.  1.  Acute metabolic encephalopathy on admission-secondary to likely muscle relaxants. -Baclofen held.  Was started recently due to lower back surgery 3 weeks ago. -Patient is back to his baseline -The head does not show any acute findings on admission.  Showing moderate cortical volume loss and scattered small vessel ischemic changes -Physical therapy recommended rehab -Urine analysis negative for infection -Appreciate neurology input  2.  Atrial fibrillation-rate controlled.  Patient on metoprolol.  Also on warfarin for anticoagulation  3.  Systolic CHF-chronic, euvolemic -Continue metoprolol, Entresto.  4.  Constipation-meds added  5.  Bipolar-on Lexapro and Seroquel  Physical therapy consulted.  Will need rehab.  Likely [facility]. -Appreciate social worker consult     All the records are reviewed and case discussed with Care Management/Social Workerr. Management plans discussed with the patient, family and they are in agreement.  CODE STATUS: Full code  TOTAL TIME TAKING CARE OF THIS PATIENT: 37 minutes.   POSSIBLE D/C IN 1-2 DAYS, DEPENDING ON CLINICAL CONDITION.   Gladstone Lighter M.D on 11/21/2017 at 4:20 PM  Between 7am to 6pm - Pager - [phone]  After 6pm go to [email or web address] - password [email or web address] [facility]  Office  [phone]  CC: Primary care physician; [place],  Wynelle Fanny, MD

## 2017-11-21 NOTE — NC FL2 (Signed)
[place] LEVEL OF CARE SCREENING TOOL     IDENTIFICATION  Patient Name: David Duncan Birthdate: [birth year] Sex: male Admission Date (Current Location): 11/19/2017  [place] and [place] Number:  Engineering geologist and Address:  [facility], [address], [place], [place] [address]      Provider Number: [id number]  Attending Physician Name and Address:  Gladstone Lighter, MD  Relative Name and Phone Number:  Diem Pagnotta- spouse [phone]    Current Level of Care: Hospital Recommended Level of Care: [place] Prior Approval Number:    Date Approved/Denied:   PASRR Number: [id number]  Discharge Plan: SNF    Current Diagnoses: Patient Active Problem List   Diagnosis Date Noted  . Confusion 11/19/2017  . Neurogenic claudication 10/05/2017  . Preoperative examination 09/26/2017  . H/O falling 05/02/2017  . Elevated serum creatinine 05/02/2017  . Hyperkalemia 05/02/2017  . Depression 11/12/2016  . Right shoulder injury 05/26/2016  . Pain in joint, shoulder region 04/02/2016  . BPH associated with nocturia 02/17/2016  . Mass in the abdomen 02/09/2016  . Disequilibrium 01/29/2016  . Unsteadiness 01/29/2016  . Family history of prostate cancer 01/07/2016  . Hypothyroidism 01/07/2016  . S/P total knee arthroplasty 12/03/2015  . Routine general medical examination at a health care facility 08/08/2015  . Spinal stenosis in cervical region 02/05/2015  . Cervical spinal stenosis 02/02/2015  . Left shoulder pain 12/06/2014  . Cardiomyopathy, dilated ([place]) 09/16/2014  . Cerebral vascular accident ([place]) 09/16/2014  . Heart valve disease 09/16/2014  . MI (mitral incompetence) 08/08/2014  . TI (tricuspid incompetence) 08/08/2014  . Atrial fibrillation, chronic ([place]) 01/16/2014  . Chronic systolic heart failure ([place]) 01/16/2014  . Bunion, right foot 08/17/2013  . Macular degeneration 10/16/2012  . Encounter  for Medicare annual wellness exam 08/16/2012  . Hearing decreased 12/16/2010  . Medication adverse effect 07/28/2010  . Prostate cancer screening 07/28/2010  . Long term (current) use of anticoagulants 07/22/2010  . Hyperlipidemia 06/01/2010  . ATRIAL FLUTTER 11/28/2009  . Bipolar disorder ([place]) 02/20/2009    Orientation RESPIRATION BLADDER Height & Weight     Self, Time, Place  Normal Continent Weight: 162 lb 3.2 oz (73.6 kg) Height:     BEHAVIORAL SYMPTOMS/MOOD NEUROLOGICAL BOWEL NUTRITION STATUS  (none) (None) Continent Diet(Regular)  AMBULATORY STATUS COMMUNICATION OF NEEDS Skin   Extensive Assist Verbally Normal                       Personal Care Assistance Level of Assistance  Bathing, Feeding, Dressing Bathing Assistance: Limited assistance Feeding assistance: Independent Dressing Assistance: Limited assistance     Functional Limitations Info  Sight, Hearing, Speech Sight Info: Adequate Hearing Info: Adequate Speech Info: Adequate    SPECIAL CARE FACTORS FREQUENCY  PT (By licensed PT), OT (By licensed OT)     PT Frequency: 5 OT Frequency: 5            Contractures Contractures Info: Not present    Additional Factors Info  Code Status, Allergies Code Status Info: Full Code  Allergies Info: NKA           Current Medications (11/21/2017):  This is the current hospital active medication list Current Facility-Administered Medications  Medication Dose Route Frequency Provider Last Rate Last Dose  . 0.45 % sodium chloride infusion   Intravenous Continuous Lavonia Dana, MD 50 mL/hr at 11/21/17 0728    . acetaminophen (TYLENOL) tablet 650 mg  650  mg Oral Q6H PRN Harrie Foreman, MD   650 mg at 11/20/17 2010   Or  . acetaminophen (TYLENOL) suppository 650 mg  650 mg Rectal Q6H PRN Harrie Foreman, MD      . docusate sodium (COLACE) capsule 100 mg  100 mg Oral BID Harrie Foreman, MD   100 mg at 11/21/17 0914  . escitalopram (LEXAPRO)  tablet 10 mg  10 mg Oral Daily Gladstone Lighter, MD      . haloperidol lactate (HALDOL) injection 5 mg  5 mg Intramuscular Q6H PRN Salary, Montell D, MD   5 mg at 11/20/17 2010  . metoprolol succinate (TOPROL-XL) 24 hr tablet 100 mg  100 mg Oral Daily Harrie Foreman, MD   100 mg at 11/21/17 0914  . ondansetron (ZOFRAN) tablet 4 mg  4 mg Oral Q6H PRN Harrie Foreman, MD       Or  . ondansetron [facility]) injection 4 mg  4 mg Intravenous Q6H PRN Harrie Foreman, MD      . QUEtiapine (SEROQUEL) tablet 25 mg  25 mg Oral QHS PRN Lance Coon, MD   25 mg at 11/20/17 2011  . sacubitril-valsartan (ENTRESTO) 24-26 mg per tablet  1 tablet Oral BID Gladstone Lighter, MD      . warfarin (COUMADIN) tablet 3 mg  3 mg Oral q1800 Harrie Foreman, MD   3 mg at 11/20/17 1928  . Warfarin - Pharmacist Dosing Inpatient   Does not apply q1800 Ramond Dial, [facility]         Discharge Medications: Please see discharge summary for a list of discharge medications.  Relevant Imaging Results:  Relevant Lab Results:   Additional Information SSN [id number]  Annamaria Boots, [place]

## 2017-11-21 NOTE — Progress Notes (Signed)
ANTICOAGULATION CONSULT NOTE - Follow up [place] for warfarin dosing  Indication: atrial fibrillation  No Known Allergies  Patient Measurements: Weight: 162 lb 3.2 oz (73.6 kg)  Vital Signs: Temp: 97.5 F (36.4 C) (08/12 0350) Temp Source: Oral (08/12 0350) BP: 162/88 (08/12 0421) Pulse Rate: 113 (08/12 0350)  Labs: Recent Labs    11/19/17 0034 11/19/17 0104 11/19/17 0122 11/20/17 0443 11/21/17 0432  HGB 13.9  --   --   --   --   HCT 40.6  --   --   --   --   PLT 143*  --   --   --   --   LABPROT  --   --  26.3* 25.4* 28.5*  INR  --   --  2.44 2.33 2.70  CREATININE  --  1.64*  --  1.82* 1.58*  TROPONINI  --  <0.03  --   --   --     Estimated Creatinine Clearance: 30.8 mL/min (A) (by C-G formula based on SCr of 1.58 mg/dL (H)).   Medications:  Home regimen 2.5 mg daily with 5 mg every 5 days per wife.  Assessment:  DATE INR DOSE 8/10  2.44  3 mg 8/11 2.33 3 mg 8/12 2.7  Goal of Therapy:  INR 2-3    Plan:  Patient weekly dose is 20 mg. This is about 3mg  daily. Will continue warfarin 3 mg PO daily for ease of dosing for now. INR has been therapeutic x 3 but not within +/- 0.2 so we will continue daily INRs until within range and stable. CBC ordered for tomorrow.  Will need to monitor CBC at least every three days per policy.  Tanaisha Pittman A. Jordan Hawks, PharmD, BCPS Clinical Pharmacist 11/21/2017 7:24 AM

## 2017-11-21 NOTE — Evaluation (Signed)
Physical Therapy Evaluation Patient Details Name: David Duncan MRN: [id number] DOB: [birth year] Today's Date: 11/21/2017   History of Present Illness  Patient is 84 yo male admitted for confusion, emesis, dizziness, acute kidney injury with PMH of bipolar disorder, afib, CHF. CVA, tremor, GERD, HTN, basal cell cancer (on shoulder), depression, anxiety, macular degeneration, and recent lumbar laminectomy/decompression microdisectomy on 10/05/17  Clinical Impression  Patient A&Ox3 at start of session with no complaints of pain, no family at bedside to confirm baseline of cognitive functioning. Patient reports that he lives in 1 story home with wife, according to patient she is "available if I need her". States previously ambulated household distances with RW and independent for ADLs. Upon assessment patient demonstrates generalized weakness of UE and LE, decreased activity tolerance, endurance, gait and mobility, as well as decreased balance. Patient need minAx1 for bed mobility, CGA/minAx1 and RW for transfers. MinAx1 for immediate standing balance and occasionally during ambulation ~50ft in room with RW for instability due to posterior lean/LOB. The patient would benefit from further skilled PT to address these limitations and changes from PLOF and to maximize independence, safety, and mobility. Current recommendation is SNF due to level of assistance needed for mobility and decreased caregiver support.     Follow Up Recommendations SNF    Equipment Recommendations  None recommended by PT(Patient has RW and canes at home)    Recommendations for Other Services       Precautions / Restrictions Precautions Precautions: Fall Restrictions Weight Bearing Restrictions: No      Mobility  Bed Mobility Overal bed mobility: Needs Assistance Bed Mobility: Supine to Sit     Supine to sit: Min assist     General bed mobility comments: minAx1 for complete trunk elevation, verbal/tactile cues  for sequencing of movements/hand placement  Transfers Overall transfer level: Needs assistance Equipment used: Rolling walker (2 wheeled) Transfers: Sit to/from Stand Sit to Stand: Min assist         General transfer comment: Patient needed minAx1 for immediate standing balance, patient has posterior lean. x2 this session.  Ambulation/Gait Ambulation/Gait assistance: Min assist;Min guard Gait Distance (Feet): 15 Feet Assistive device: Rolling walker (2 wheeled)       General Gait Details: Demonstrates instability with ambulation, CGA/minAx1 for 1-2 instances of posterior lean/LOB. Slow, small steps  Stairs            Wheelchair Mobility    Modified Rankin (Stroke Patients Only)       Balance Overall balance assessment: Needs assistance Sitting-balance support: Feet supported;Bilateral upper extremity supported Sitting balance-Leahy Scale: Poor Sitting balance - Comments: Patient required minAx1, improvement to CGA with b/l UE support     Standing balance-Leahy Scale: Poor                               Pertinent Vitals/Pain Pain Assessment: No/denies pain    Home Living Family/patient expects to be discharged to:: Private residence Living Arrangements: Spouse/significant other Available Help at Discharge: Family;Available PRN/intermittently Type of Home: House Home Access: Stairs to enter Entrance Stairs-Rails: Can reach both Entrance Stairs-Number of Steps: 1 Home Layout: One level Home Equipment: Walker - 2 wheels;Cane - single point;Bedside commode;Grab bars - tub/shower;Shower seat - built in      Prior Function Level of Independence: Independent with assistive device(s)         Comments: Patient reports that he was ambulating with RW at home for household distances,  states that his wife is his main helper if/when he needs it. Independent with ADLs.     Hand Dominance   Dominant Hand: Right    Extremity/Trunk Assessment    Upper Extremity Assessment Upper Extremity Assessment: Defer to OT evaluation;Generalized weakness;RUE deficits/detail;LUE deficits/detail RUE Deficits / Details: shoulder flexion 2+, elbow flex/extend and grip 3+/5 LUE Deficits / Details: shoulder flexion 3/5, elbow flex/extend and grip  3+/5    Lower Extremity Assessment Lower Extremity Assessment: Generalized weakness;RLE deficits/detail;LLE deficits/detail RLE Deficits / Details: grossly 4-/5 LLE Deficits / Details: grossly 4-/5       Communication   Communication: No difficulties  Cognition Arousal/Alertness: Awake/alert Behavior During Therapy: WFL for tasks assessed/performed Overall Cognitive Status: No family/caregiver present to determine baseline cognitive functioning                                 General Comments: Patient A&Ox3      General Comments      Exercises     Assessment/Plan    PT Assessment Patient needs continued PT services  PT Problem List Decreased strength;Decreased activity tolerance;Decreased balance;Decreased mobility       PT Treatment Interventions Balance training;Gait training;Neuromuscular re-education;Stair training;Functional mobility training;Patient/family education;Therapeutic activities;Therapeutic exercise    PT Goals (Current goals can be found in the Care Plan section)  Acute Rehab PT Goals Patient Stated Goal: Patient wants to get up and walk PT Goal Formulation: With patient Time For Goal Achievement: 12/05/17 Potential to Achieve Goals: Good    Frequency Min 2X/week   Barriers to discharge Decreased caregiver support Family not in room at time of evaluation. Per patient, wife did not provide physical assistance prior to admission.     Co-evaluation               AM-PAC PT "6 Clicks" Daily Activity  Outcome Measure Difficulty turning over in bed (including adjusting bedclothes, sheets and blankets)?: A Little Difficulty moving from lying on  back to sitting on the side of the bed? : Unable Difficulty sitting down on and standing up from a chair with arms (e.g., wheelchair, bedside commode, etc,.)?: Unable Help needed moving to and from a bed to chair (including a wheelchair)?: A Little Help needed walking in hospital room?: A Lot Help needed climbing 3-5 steps with a railing? : A Lot 6 Click Score: 12    End of Session Equipment Utilized During Treatment: Gait belt Activity Tolerance: Patient tolerated treatment well Patient left: with chair alarm set;in chair;with call bell/phone within reach Nurse Communication: Mobility status PT Visit Diagnosis: Unsteadiness on feet (R26.81);Difficulty in walking, not elsewhere classified (R26.2);Other abnormalities of gait and mobility (R26.89);Muscle weakness (generalized) (M62.81)    Time: 1761-6073 PT Time Calculation (min) (ACUTE ONLY): 19 min   Charges:   PT Evaluation $PT Eval Low Complexity: [place] PT, DPT 9:19 AM,11/21/17 [phone]

## 2017-11-22 DIAGNOSIS — F05 Delirium due to known physiological condition: Secondary | ICD-10-CM | POA: Diagnosis not present

## 2017-11-22 DIAGNOSIS — G92 Toxic encephalopathy: Secondary | ICD-10-CM | POA: Diagnosis not present

## 2017-11-22 DIAGNOSIS — R4182 Altered mental status, unspecified: Secondary | ICD-10-CM | POA: Diagnosis not present

## 2017-11-22 DIAGNOSIS — N179 Acute kidney failure, unspecified: Secondary | ICD-10-CM | POA: Diagnosis not present

## 2017-11-22 DIAGNOSIS — I4891 Unspecified atrial fibrillation: Secondary | ICD-10-CM | POA: Diagnosis not present

## 2017-11-22 DIAGNOSIS — G4701 Insomnia due to medical condition: Secondary | ICD-10-CM | POA: Diagnosis not present

## 2017-11-22 DIAGNOSIS — Z741 Need for assistance with personal care: Secondary | ICD-10-CM | POA: Diagnosis not present

## 2017-11-22 DIAGNOSIS — I482 Chronic atrial fibrillation: Secondary | ICD-10-CM | POA: Diagnosis not present

## 2017-11-22 DIAGNOSIS — I5022 Chronic systolic (congestive) heart failure: Secondary | ICD-10-CM | POA: Diagnosis not present

## 2017-11-22 DIAGNOSIS — I132 Hypertensive heart and chronic kidney disease with heart failure and with stage 5 chronic kidney disease, or end stage renal disease: Secondary | ICD-10-CM | POA: Diagnosis not present

## 2017-11-22 DIAGNOSIS — Z7401 Bed confinement status: Secondary | ICD-10-CM | POA: Diagnosis not present

## 2017-11-22 DIAGNOSIS — M6281 Muscle weakness (generalized): Secondary | ICD-10-CM | POA: Diagnosis not present

## 2017-11-22 DIAGNOSIS — G9340 Encephalopathy, unspecified: Secondary | ICD-10-CM | POA: Diagnosis not present

## 2017-11-22 DIAGNOSIS — M5416 Radiculopathy, lumbar region: Secondary | ICD-10-CM | POA: Diagnosis not present

## 2017-11-22 DIAGNOSIS — N183 Chronic kidney disease, stage 3 (moderate): Secondary | ICD-10-CM | POA: Diagnosis not present

## 2017-11-22 DIAGNOSIS — R262 Difficulty in walking, not elsewhere classified: Secondary | ICD-10-CM | POA: Diagnosis not present

## 2017-11-22 DIAGNOSIS — G8191 Hemiplegia, unspecified affecting right dominant side: Secondary | ICD-10-CM | POA: Diagnosis not present

## 2017-11-22 LAB — CBC
HEMATOCRIT: 37.7 % — AB (ref 40.0–52.0)
HEMOGLOBIN: 13.1 g/dL (ref 13.0–18.0)
MCH: 34.7 pg — ABNORMAL HIGH (ref 26.0–34.0)
MCHC: 34.7 g/dL (ref 32.0–36.0)
MCV: 99.9 fL (ref 80.0–100.0)
Platelets: 135 10*3/uL — ABNORMAL LOW (ref 150–440)
RBC: 3.77 MIL/uL — ABNORMAL LOW (ref 4.40–5.90)
RDW: 14.6 % — ABNORMAL HIGH (ref 11.5–14.5)
WBC: 8.7 10*3/uL (ref 3.8–10.6)

## 2017-11-22 LAB — BASIC METABOLIC PANEL
Anion gap: 6 (ref 5–15)
BUN: 29 mg/dL — AB (ref 8–23)
CHLORIDE: 109 mmol/L (ref 98–111)
CO2: 24 mmol/L (ref 22–32)
CREATININE: 1.56 mg/dL — AB (ref 0.61–1.24)
Calcium: 8.8 mg/dL — ABNORMAL LOW (ref 8.9–10.3)
GFR calc Af Amer: 45 mL/min — ABNORMAL LOW (ref >60)
GFR calc non Af Amer: 39 mL/min — ABNORMAL LOW (ref >60)
Glucose, Bld: 101 mg/dL — ABNORMAL HIGH (ref 70–99)
Potassium: 4.3 mmol/L (ref 3.5–5.1)
Sodium: 139 mmol/L (ref 135–145)

## 2017-11-22 LAB — PROTIME-INR
INR: 2.8
Prothrombin Time: 29.3 seconds — ABNORMAL HIGH (ref 11.4–15.2)

## 2017-11-22 LAB — RPR
RPR Ser Ql: NONREACTIVE
RPR Ser Ql: NONREACTIVE

## 2017-11-22 MED ORDER — POLYETHYLENE GLYCOL 3350 17 G PO PACK: 17.0000 g | PACK | Freq: Every day | ORAL | 0 refills | Status: DC

## 2017-11-22 MED ORDER — TRAMADOL HCL 50 MG PO TABS: 50.0000 mg | ORAL_TABLET | Freq: Four times a day (QID) | ORAL | 0 refills | Status: DC | PRN

## 2017-11-22 MED ORDER — ESCITALOPRAM OXALATE 20 MG PO TABS: 10.0000 mg | ORAL_TABLET | ORAL | 2 refills | Status: DC

## 2017-11-22 MED ORDER — DOCUSATE SODIUM 100 MG PO CAPS: 100.0000 mg | ORAL_CAPSULE | Freq: Two times a day (BID) | ORAL | 0 refills | Status: DC

## 2017-11-22 MED ORDER — SENNA 8.6 MG PO TABS: 1.0000 | ORAL_TABLET | Freq: Every day | ORAL | 0 refills | Status: DC

## 2017-11-22 MED ORDER — WARFARIN SODIUM 3 MG PO TABS: 3.0000 mg | ORAL_TABLET | Freq: Every day | ORAL | 2 refills | Status: DC

## 2017-11-22 NOTE — Progress Notes (Signed)
David Duncan, [place] 11/22/17  Subjective:   Patient is doing well Finished working with physical therapy States that he is able to eat good.  No nausea or vomiting No leg edema  Objective:  Vital signs in last 24 hours:  Temp:  [97.7 F (36.5 C)-98.1 F (36.7 C)] 98 F (36.7 C) (08/13 0516) Pulse Rate:  [92-111] 98 (08/13 0735) Resp:  [16-18] 18 (08/13 0735) BP: (141-151)/(84-110) 145/92 (08/13 0735) SpO2:  [96 %-98 %] 97 % (08/13 0735) Weight:  [72.4 kg] 72.4 kg (08/13 0516)  Weight change: -1.179 kg Filed Weights   11/20/17 0100 11/21/17 0350 11/22/17 0516  Weight: 71 kg 73.6 kg 72.4 kg    Intake/Output:    Intake/Output Summary (Last 24 hours) at 11/22/2017 1145 Last data filed at 11/22/2017 0100 Gross per 24 hour  Intake 1150 ml  Output 1100 ml  Net 50 ml     Physical Exam: General:  No acute distress, sitting in a chair  HEENT  anicteric, moist oral mucous membranes  Neck  supple  Pulm/lungs  coarse breath sounds at bases, otherwise clear, room air  CVS/Heart  no rub  Abdomen:   Soft, nontender  Extremities:  No edema  Neurologic:  Alert, oriented  Skin:  No acute rashes          Basic Metabolic Panel:  Recent Labs  Lab 11/19/17 0104 11/20/17 0443 11/21/17 0432 11/22/17 0409  NA 138 140 138 139  K 4.4 4.3 4.0 4.3  CL 108 107 106 109  CO2 21* 24 23 24   GLUCOSE 138* 89 95 101*  BUN 35* 32* 26* 29*  CREATININE 1.64* 1.82* 1.58* 1.56*  CALCIUM 8.7* 9.1 8.9 8.8*     CBC: Recent Labs  Lab 11/19/17 0034 11/22/17 0409  WBC 8.4 8.7  NEUTROABS 6.1  --   HGB 13.9 13.1  HCT 40.6 37.7*  MCV 100.7* 99.9  PLT 143* 135*     No results found for: HEPBSAG, HEPBSAB, HEPBIGM    Microbiology:  No results found for this or any previous visit (from the past 240 hour(s)).  Coagulation Studies: Recent Labs    11/20/17 0443 11/21/17 0432 11/22/17 0409  LABPROT 25.4* 28.5* 29.3*  INR 2.33 2.70 2.80     Urinalysis: No results for input(s): COLORURINE, LABSPEC, PHURINE, GLUCOSEU, HGBUR, BILIRUBINUR, KETONESUR, PROTEINUR, UROBILINOGEN, NITRITE, LEUKOCYTESUR in the last 72 hours.  Invalid input(s): APPERANCEUR    Imaging: No results found.   Medications:    . docusate sodium  100 mg Oral BID  . escitalopram  10 mg Oral Daily  . metoprolol succinate  100 mg Oral Daily  . polyethylene glycol  17 g Oral Daily  . sacubitril-valsartan  1 tablet Oral BID  . senna  1 tablet Oral QHS  . warfarin  3 mg Oral q1800  . Warfarin - Pharmacist Dosing Inpatient   Does not apply q1800   acetaminophen **OR** acetaminophen, bisacodyl, haloperidol lactate, ondansetron **OR** ondansetron (ZOFRAN) IV, QUEtiapine  Assessment/ Plan:  84 y.o. Caucasian male with bipolar disorder, hypertension, systolic congestive heart failure, atrial fibrillation on warfarin, CVA, tremor, GERD, who was admitted to [facility] on 11/19/2017 for Dizziness, Delirium and vomiting with nausea, unspecified vomiting   1. Acute renal failure on chronic kidney disease stage III: baseline creatinine of 1.34, GFR of 47 on 04/26/17.  Acute renal failure secondary to prerenal azotemia Chronic kidney disease secondary to hypertension, congestive heart failure and remote lithium exposure. No history of NSAIDs  or diabetes.  Serum creatinine appears to be stable at 1.6 May continue to use furosemide as needed We will follow-up as outpatient   LOS: 0 David Duncan 8/13/201911:45 AM  [place], [place]  Note: This note was prepared with Dragon dictation. Any transcription errors are unintentional

## 2017-11-22 NOTE — Progress Notes (Signed)
Physical Therapy Treatment Patient Details Name: David Duncan MRN: [id number] DOB: May 25, 1930 Today's Date: 11/22/2017    History of Present Illness Patient is 84 yo male admitted for confusion, emesis, dizziness, acute kidney injury with PMH of bipolar disorder, afib, CHF. CVA, tremor, GERD, HTN, basal cell cancer (on shoulder), depression, anxiety, macular degeneration, and recent lumbar laminectomy/decompression microdisectomy on 10/05/17    PT Comments    Patient A&Ox4 at start of session with no complaints of pain, agreeable to PT. The patient still needed minAx1 for complete trunk elevation during supine to sit transfer with HOB elevated. Improved immediate standing balance with sit to stands, though patient using recline/bed behind legs for stabilization, CGA from PT. Patient ambulated ~49ft with CGA, no LOB noted this session, patient needs extensive verbal/visual cues and occasional assistance for AD management. Impulsive with turns with walker. Crouched gait and base of support outside of walker, able to improve for short periods with verbal cues. Good participation in therapeutic exercises, most fatigue noted with chair pushups. Patient up in chair at end of session with all needs in reach.     Follow Up Recommendations  SNF     Equipment Recommendations  None recommended by PT    Recommendations for Other Services       Precautions / Restrictions Precautions Precautions: Fall Restrictions Weight Bearing Restrictions: No    Mobility  Bed Mobility Overal bed mobility: Needs Assistance Bed Mobility: Supine to Sit     Supine to sit: Min assist     General bed mobility comments: minAx1 for complete trunk elevation, verbal/tactile cues for sequencing of movements/hand placement  Transfers Overall transfer level: Needs assistance Equipment used: Rolling walker (2 wheeled) Transfers: Sit to/from Stand Sit to Stand: Min guard         General transfer comment:  improved immediate standing balance compared to last session, though patient does use calves to stabilize against surface to aide in balance  Ambulation/Gait Ambulation/Gait assistance: Min guard;Min assist Gait Distance (Feet): 40 Feet Assistive device: Rolling walker (2 wheeled)       General Gait Details: No LOB noted this session, patient needs extensive verbal/visual cues and occasional assistance for AD management. Crouched gait and base of support outside of walker, able to improve for short periods with verbal cues.   Stairs             Wheelchair Mobility    Modified Rankin (Stroke Patients Only)       Balance Overall balance assessment: Needs assistance Sitting-balance support: Feet supported Sitting balance-Leahy Scale: Fair Sitting balance - Comments: CGA for balance at EOB     Standing balance-Leahy Scale: Poor                              Cognition Arousal/Alertness: Awake/alert Behavior During Therapy: WFL for tasks assessed/performed Overall Cognitive Status: Within Functional Limits for tasks assessed                                        Exercises General Exercises - Lower Extremity Ankle Circles/Pumps: AROM;Strengthening;Both;20 reps Long Arc Quad: AROM;Strengthening;Both;20 reps Hip Flexion/Marching: AROM;Strengthening;Both;20 reps Toe Raises: AROM;Strengthening;Both;20 reps Heel Raises: AROM;Strengthening;Both;20 reps Other Exercises Other Exercises: chair pushups with arms of recliners,x10 fatigued after, demo from PT needed Other Exercises: hip adduction/hip abduction isometrics x20 with PT assist for hip abduction  isometrics    General Comments        Pertinent Vitals/Pain Pain Assessment: No/denies pain    Home Living                      Prior Function            PT Goals (current goals can now be found in the care plan section) Progress towards PT goals: Progressing toward goals     Frequency    Min 2X/week      PT Plan Current plan remains appropriate    Co-evaluation              AM-PAC PT "6 Clicks" Daily Activity  Outcome Measure  Difficulty turning over in bed (including adjusting bedclothes, sheets and blankets)?: A Little Difficulty moving from lying on back to sitting on the side of the bed? : Unable Difficulty sitting down on and standing up from a chair with arms (e.g., wheelchair, bedside commode, etc,.)?: Unable Help needed moving to and from a bed to chair (including a wheelchair)?: A Little Help needed walking in hospital room?: A Lot Help needed climbing 3-5 steps with a railing? : A Lot 6 Click Score: 12    End of Session Equipment Utilized During Treatment: Gait belt Activity Tolerance: Patient tolerated treatment well Patient left: with chair alarm set;in chair;with call bell/phone within reach(heels elevated) Nurse Communication: Mobility status PT Visit Diagnosis: Unsteadiness on feet (R26.81);Difficulty in walking, not elsewhere classified (R26.2);Other abnormalities of gait and mobility (R26.89);Muscle weakness (generalized) (M62.81)     Time: 9643-8381 PT Time Calculation (min) (ACUTE ONLY): 26 min  Charges:  $Therapeutic Exercise: 8-22 mins $Therapeutic Activity: 8-22 mins           Lieutenant Diego PT, DPT 11:24 AM,11/22/17 [phone]

## 2017-11-22 NOTE — Care Management (Signed)
[facility] notified of discharge to [facility]

## 2017-11-22 NOTE — Progress Notes (Signed)
Discharge report called to [facility]/ verbalized an understanding/ IV removed/ EMS called to transport

## 2017-11-22 NOTE — Clinical Social Work Note (Signed)
Patient is medically ready for discharge today to [facility]. CSW notified patient and his wife Nazair Fortenberry at bedside. CSW also notified Seth Bake, admissions coordinator at [facility] of patient discharge. Patient will be transported by EMS. RN to call report and call for transport.   [place], [place], [place]

## 2017-11-22 NOTE — Progress Notes (Signed)
ANTICOAGULATION CONSULT NOTE  Pharmacy Consult for warfarin Indication: atrial fibrillation  No Known Allergies  Patient Measurements: Weight: 159 lb 9.6 oz (72.4 kg)  Vital Signs: Temp: 98 F (36.7 C) (08/13 0516) Temp Source: Oral (08/13 0516) BP: 141/84 (08/13 0516) Pulse Rate: 92 (08/13 0516)  Labs: Recent Labs    11/20/17 0443 11/21/17 0432 11/22/17 0409  HGB  --   --  13.1  HCT  --   --  37.7*  PLT  --   --  135*  LABPROT 25.4* 28.5* 29.3*  INR 2.33 2.70 2.80  CREATININE 1.82* 1.58* 1.56*    Estimated Creatinine Clearance: 31.2 mL/min (A) (by C-G formula based on SCr of 1.56 mg/dL (H)).   Medications:  Home regimen 2.5 mg daily with 5 mg every 5 days per wife.  Assessment:  DATE  INR      DOSE 8/10     2.44     3 mg 8/11     2.33     3 mg 8/12     2.7       3 mg 8/13     2.8  Goal of Therapy:  INR 2-3   Plan:  Patient weekly dose is 20 mg. This is about 3mg  daily. Will continue warfarin 3 mg PO daily for ease of dosing for now. INR has been therapeutic x 4 and appears to be stabilizing. Will monitor INR until stable.  Will need to monitor CBC at least every three days per policy.  Tawnya Crook, PharmD Pharmacy Resident  11/22/2017 7:50 AM

## 2017-11-22 NOTE — Discharge Summary (Signed)
[facility] [place] [facility] [place] NAME: David Duncan    MR#:  [id number]  DATE OF BIRTH:  [birth year]  DATE OF ADMISSION:  11/19/2017   ADMITTING PHYSICIAN: Harrie Foreman, MD  DATE OF DISCHARGE:  11/22/17  PRIMARY CARE PHYSICIAN: Tower, Wynelle Fanny, MD   ADMISSION DIAGNOSIS:   Dizziness [R42] Delirium [R41.0] Non-intractable vomiting with nausea, unspecified vomiting type [R11.2]  DISCHARGE DIAGNOSIS:   Active Problems:   Confusion   SECONDARY DIAGNOSIS:   Past Medical History:  Diagnosis Date  . Anxiety   . Arrhythmia   . Atrial fibrillation ([place])   . Basal cell cancer 2008   on shoulder  . Bipolar disorder [facility])    hospitalized in past  . Cardiomyopathy ([place])   . CHF (congestive heart failure) ([place])   . CVA (cerebral vascular accident) ([place]) 8/11  . Depression   . Dyslipidemia   . Generalized anxiety disorder   . GERD (gastroesophageal reflux disease)   . History of kidney stones    30 years ago  . Hypothyroidism   . Macular degeneration   . Melanoma ([place])    resected from scalp approximately 1 year ago.   . Osteoarthritis    in neck and left knee  . Tremor    noted when writing  . Valvular heart disease     HOSPITAL COURSE:   84 year old male with past medical history significant for hypertension, CHF, history of CVA, A. fib on warfarin, bipolar disorder presents to hospital from home secondary to dizziness and delirium.  1.  Acute metabolic encephalopathy on admission-secondary to likely muscle relaxants- baclofen. -Baclofen held.  Was started recently due to lower back surgery 3 weeks ago. -Patient is back to his baseline mental status wise- discharge on tramadol only for prn pain -CT head does not show any acute findings on admission.  Showing moderate cortical volume loss and scattered small vessel ischemic changes -Physical therapy recommended rehab -Urine analysis negative for infection -Appreciate neurology  input  2.  Atrial fibrillation-rate controlled.  Patient on metoprolol.  Also on warfarin for anticoagulation INR to be checked in 3 days  3.  Systolic CHF-chronic, euvolemic -Continue metoprolol, Entresto.  4.  Constipation-meds added  5.  Bipolar-on Lexapro   Physical therapy consulted.  Will need rehab.   going to [facility]. -Appreciate social worker consult  DISCHARGE CONDITIONS:   Guarded  CONSULTS OBTAINED:   Treatment Team:  Alexis Goodell, MD  DRUG ALLERGIES:   No Known Allergies DISCHARGE MEDICATIONS:   Allergies as of 11/22/2017   No Known Allergies     Medication List    STOP taking these medications   baclofen 10 MG tablet Commonly known as:  LIORESAL   oxyCODONE 5 MG immediate release tablet Commonly known as:  Oxy IR/ROXICODONE     TAKE these medications   Acetaminophen 500 MG coapsule Take 2 capsules by mouth every 6 (six) hours as needed for pain.   B COMPLEX PO Take 1 tablet by mouth 2 (two) times daily.   CALCIUM 500 PO Take 2 tablets by mouth daily.   DEPLIN 15 15-90.314 MG Caps Take 15 mg by mouth every morning.   diclofenac sodium 1 % Gel Commonly known as:  VOLTAREN Apply 2 g topically 3 (three) times daily as needed (pain).   docusate sodium 100 MG capsule Commonly known as:  COLACE Take 1 capsule (100 mg total) by mouth 2 (two) times daily.   escitalopram  20 MG tablet Commonly known as:  LEXAPRO Take 0.5 tablets (10 mg total) by mouth every morning.   FREE FORM AMINO ACID COMPLEX PO Take 1 capsule by mouth 2 (two) times daily.   furosemide 20 MG tablet Commonly known as:  LASIX Take 20 mg by mouth daily as needed for fluid.   ICAPS LUTEIN-ZEAXANTHIN PO Take 2 capsules by mouth daily.   loperamide 2 MG capsule Commonly known as:  IMODIUM Take 2 mg by mouth as needed for diarrhea or loose stools.   MELATONIN PO Take 2 tablets by mouth at bedtime. Restful Sleep by [facility] : Hops Extract 4:1 (Humulus lupulus)  (flower) Chamomile (Matricaria chamomilla) (flower) Passion Flower Extract (standardized to 4% vitexin) (Passiflora incarnate) (flower) 5 HTP (5-Hydroxtryptophan) (Griffonia simplicifolia) (seed)   metoprolol succinate 100 MG 24 hr tablet Commonly known as:  TOPROL-XL Take 1 tablet (100 mg total) by mouth daily. Take with or immediately following a meal.   multivitamin capsule Take 1 capsule by mouth daily.   ondansetron 4 MG disintegrating tablet Commonly known as:  ZOFRAN-ODT Take 1 tablet (4 mg total) by mouth every 8 (eight) hours as needed for nausea or vomiting.   polyethylene glycol packet Commonly known as:  MIRALAX / GLYCOLAX Take 17 g by mouth daily. Start taking on:  11/23/2017   PROSTATE PO Take 1 tablet by mouth daily.   sacubitril-valsartan 24-26 MG Commonly known as:  ENTRESTO Take 1 tablet by mouth 2 (two) times daily.   senna 8.6 MG Tabs tablet Commonly known as:  SENOKOT Take 1 tablet (8.6 mg total) by mouth at bedtime.   traMADol 50 MG tablet Commonly known as:  ULTRAM Take 1 tablet (50 mg total) by mouth every 6 (six) hours as needed.   Vitamin B-12 1000 MCG Subl Place 1 tablet under the tongue daily.   vitamin C 500 MG tablet Commonly known as:  ASCORBIC ACID Take 500 mg by mouth 2 (two) times daily.   warfarin 3 MG tablet Commonly known as:  COUMADIN Take 1 tablet (3 mg total) by mouth daily. What changed:    medication strength  how much to take  when to take this  additional instructions        DISCHARGE INSTRUCTIONS:   1. PCP f/u in 1 week 2. INR check in 3 days and adjust coumadin dose  DIET:   Cardiac diet  ACTIVITY:   Activity as tolerated  OXYGEN:   Home Oxygen: No.  Oxygen Delivery: room air  DISCHARGE LOCATION:   nursing home   If you experience worsening of your admission symptoms, develop shortness of breath, life threatening emergency, suicidal or homicidal thoughts you must seek medical attention  immediately by calling 911 or calling your MD immediately  if symptoms less severe.  You Must read complete instructions/literature along with all the possible adverse reactions/side effects for all the Medicines you take and that have been prescribed to you. Take any new Medicines after you have completely understood and accpet all the possible adverse reactions/side effects.   Please note  You were cared for by a hospitalist during your hospital stay. If you have any questions about your discharge medications or the care you received while you were in the hospital after you are discharged, you can call the unit and asked to speak with the hospitalist on call if the hospitalist that took care of you is not available. Once you are discharged, your primary care physician will handle any further medical  issues. Please note that NO REFILLS for any discharge medications will be authorized once you are discharged, as it is imperative that you return to your primary care physician (or establish a relationship with a primary care physician if you do not have one) for your aftercare needs so that they can reassess your need for medications and monitor your lab values.    On the day of Discharge:  VITAL SIGNS:   Blood pressure (!) 145/92, pulse 98, temperature 98 F (36.7 C), temperature source Oral, resp. rate 18, weight 72.4 kg, SpO2 97 %.  PHYSICAL EXAMINATION:   GENERAL:  84 y.o.-year-old elderly patient lying in the bed with no acute distress.  EYES: Pupils equal, round, reactive to light and accommodation. No scleral icterus. Extraocular muscles intact.  HEENT: Head atraumatic, normocephalic. Oropharynx and nasopharynx clear.  NECK:  Supple, no jugular venous distention. No thyroid enlargement, no tenderness.  LUNGS: Normal breath sounds bilaterally, no wheezing, rales,rhonchi or crepitation. No use of accessory muscles of respiration.  Decreased bibasilar breath sounds CARDIOVASCULAR: S1, S2  normal. No  rubs, or gallops.  2/6 systolic murmur present ABDOMEN: Soft, nontender, nondistended. Bowel sounds present. No organomegaly or mass.  EXTREMITIES: No pedal edema, cyanosis, or clubbing.  NEUROLOGIC: Cranial nerves II through XII are intact. Muscle strength 5/5 in all extremities. Sensation intact. Gait not checked.  Global weakness noted PSYCHIATRIC: The patient is alert and oriented x 3.  SKIN: No obvious rash, lesion, or ulcer.    DATA REVIEW:   CBC Recent Labs  Lab 11/22/17 0409  WBC 8.7  HGB 13.1  HCT 37.7*  PLT 135*    Chemistries  Recent Labs  Lab 11/19/17 0104  11/22/17 0409  NA 138   < > 139  K 4.4   < > 4.3  CL 108   < > 109  CO2 21*   < > 24  GLUCOSE 138*   < > 101*  BUN 35*   < > 29*  CREATININE 1.64*   < > 1.56*  CALCIUM 8.7*   < > 8.8*  AST 29  --   --   ALT 21  --   --   ALKPHOS 69  --   --   BILITOT 1.0  --   --    < > = values in this interval not displayed.     Microbiology Results  Results for orders placed or performed during the hospital encounter of 09/30/17  Surgical pcr screen     Status: None   Collection Time: 09/30/17  2:22 PM  Result Value Ref Range Status   MRSA, PCR NEGATIVE NEGATIVE Final   Staphylococcus aureus NEGATIVE NEGATIVE Final    Comment: (NOTE) The Xpert SA Assay (FDA approved for NASAL specimens in patients 77 years of age and older), is one component of a comprehensive surveillance program. It is not intended to diagnose infection nor to guide or monitor treatment. Performed at [facility], [address], [place], [place] [address]     RADIOLOGY:  No results found.   Management plans discussed with the patient, family and they are in agreement.  CODE STATUS:     Code Status Orders  (From admission, onward)         Start     Ordered   11/19/17 0505  Full code  Continuous     11/19/17 0504        Code Status History    Date Active Date Inactive Code  Status Order ID Comments  User Context   10/05/2017 1247 10/06/2017 2208 Full Code [id number]  Gelene Mink Inpatient   12/03/2015 1221 12/05/2015 1513 Full Code [id number]  Dereck Leep, MD Inpatient      TOTAL TIME TAKING CARE OF THIS PATIENT: 38 minutes.    Gladstone Lighter M.D on 11/22/2017 at 11:44 AM  Between 7am to 6pm - Pager - [phone]  After 6pm go to [email or web address] - Technical brewer [facility]  Office  [phone]  CC: Primary care physician; Tower, Wynelle Fanny, MD   Note: This dictation was prepared with Dragon dictation along with smaller phrase technology. Any transcriptional errors that result from this process are unintentional.

## 2017-11-24 DIAGNOSIS — M5416 Radiculopathy, lumbar region: Secondary | ICD-10-CM | POA: Diagnosis not present

## 2017-11-24 DIAGNOSIS — G8191 Hemiplegia, unspecified affecting right dominant side: Secondary | ICD-10-CM | POA: Diagnosis not present

## 2017-11-24 DIAGNOSIS — N183 Chronic kidney disease, stage 3 (moderate): Secondary | ICD-10-CM | POA: Diagnosis not present

## 2017-11-24 DIAGNOSIS — I5022 Chronic systolic (congestive) heart failure: Secondary | ICD-10-CM | POA: Diagnosis not present

## 2017-11-24 DIAGNOSIS — I482 Chronic atrial fibrillation: Secondary | ICD-10-CM | POA: Diagnosis not present

## 2017-11-24 DIAGNOSIS — F05 Delirium due to known physiological condition: Secondary | ICD-10-CM

## 2017-12-05 DIAGNOSIS — I38 Endocarditis, valve unspecified: Secondary | ICD-10-CM | POA: Diagnosis not present

## 2017-12-05 DIAGNOSIS — I5022 Chronic systolic (congestive) heart failure: Secondary | ICD-10-CM | POA: Diagnosis not present

## 2017-12-05 DIAGNOSIS — I10 Essential (primary) hypertension: Secondary | ICD-10-CM | POA: Diagnosis not present

## 2017-12-05 DIAGNOSIS — I482 Chronic atrial fibrillation: Secondary | ICD-10-CM | POA: Diagnosis not present

## 2017-12-06 ENCOUNTER — Ambulatory Visit (INDEPENDENT_AMBULATORY_CARE_PROVIDER_SITE_OTHER): Payer: PPO | Admitting: Family Medicine

## 2017-12-06 ENCOUNTER — Encounter: Payer: Self-pay | Admitting: Family Medicine

## 2017-12-06 ENCOUNTER — Other Ambulatory Visit: Payer: Self-pay

## 2017-12-06 VITALS — BP 110/78 | HR 85 | Temp 97.4°F | Ht 66.0 in | Wt 153.0 lb

## 2017-12-06 DIAGNOSIS — R7989 Other specified abnormal findings of blood chemistry: Secondary | ICD-10-CM | POA: Diagnosis not present

## 2017-12-06 DIAGNOSIS — R410 Disorientation, unspecified: Secondary | ICD-10-CM

## 2017-12-06 NOTE — Progress Notes (Signed)
Subjective:    Patient ID: David Duncan, male    DOB: July 22, 1930, 84 y.o.   MRN: [id number]  HPI  Here for f/u of hospitalization from 8/10 to 11/22/17 for n/v with dizziness and delerium thought to be a reaction to his Baclofen (given by spine specialist)  Discharged to [facility] for rehab   A/P as follows: 1. Acute metabolic encephalopathy on admission-secondary to likely muscle relaxants- baclofen. -Baclofen held. Was started recently due to lower back surgery 3 weeks ago. -Patient is back to his baseline mental status wise- discharge on tramadol only for prn pain -CT head does not show any acute findings on admission. Showing moderate cortical volume loss and scattered small vessel ischemic changes -Physical therapy recommended rehab -Urine analysis negative for infection -Appreciate neurology input  2. Atrial fibrillation-rate controlled. Patient on metoprolol. Also on warfarin for anticoagulation INR to be checked in 3 days  3. Systolic CHF-chronic, euvolemic -Continue metoprolol, Entresto.  4. Constipation-meds added  5. Bipolar-on Lexapro   Physical therapy consulted. Will need rehab.  going to [facility]. -Appreciate social worker consult  Lab Results  Component Value Date   CREATININE 1.56 (H) 11/22/2017   BUN 29 (H) 11/22/2017   NA 139 11/22/2017   K 4.3 11/22/2017   CL 109 11/22/2017   CO2 24 11/22/2017   Lab Results  Component Value Date   ALT 21 11/19/2017   AST 29 11/19/2017   ALKPHOS 69 11/19/2017   BILITOT 1.0 11/19/2017   Lab Results  Component Value Date   WBC 8.7 11/22/2017   HGB 13.1 11/22/2017   HCT 37.7 (L) 11/22/2017   MCV 99.9 11/22/2017   PLT 135 (L) 11/22/2017   Lab Results  Component Value Date   TSH 1.565 11/19/2017   glucose 101 at d/c  Lab Results  Component Value Date   INR 2.80 11/22/2017   INR 2.70 11/21/2017   INR 2.33 11/20/2017     Ct Head Wo Contrast  Result Date: 11/19/2017 CLINICAL DATA:   Acute onset of vertigo, nausea and vomiting. Hypotension. EXAM: CT HEAD WITHOUT CONTRAST TECHNIQUE: Contiguous axial images were obtained from the base of the skull through the vertex without intravenous contrast. COMPARISON:  CT of the head performed 04/26/2017, and MRI of the brain performed 11/12/2009 FINDINGS: Brain: No evidence of acute infarction, hemorrhage, hydrocephalus, extra-axial collection or mass lesion / mass effect. Prominence of the ventricles and sulci reflects moderate cortical volume loss. Cerebellar atrophy is noted. Scattered periventricular and subcortical white matter change likely reflects small vessel ischemic microangiopathy. A chronic infarct is noted at the left external capsule. The brainstem and fourth ventricle are within normal limits. The cerebral hemispheres demonstrate grossly normal gray-white differentiation. No mass effect or midline shift is seen. Vascular: No hyperdense vessel or unexpected calcification. Skull: There is no evidence of fracture; visualized osseous structures are unremarkable in appearance. Sinuses/Orbits: The visualized portions of the orbits are within normal limits. The paranasal sinuses and mastoid air cells are well-aerated. Other: No significant soft tissue abnormalities are seen. IMPRESSION: 1. No acute intracranial pathology seen on CT. 2. Moderate cortical volume loss and scattered small vessel ischemic microangiopathy. 3. 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 Electronically Signed   By: Garald Balding M.D.   On: 11/19/2017 01:06   Mr Brain Wo Contrast (neuro Protocol)  Result Date: 11/19/2017 CLINICAL DATA:  Persistent central vertigo. EXAM: MRI HEAD WITHOUT CONTRAST TECHNIQUE: Multiplanar, multiecho pulse sequences of the brain and surrounding structures  were obtained without intravenous contrast. COMPARISON:  Head CT 11/19/2017 Brain MRI 11/04/2014 FINDINGS: BRAIN: There is no acute infarct, acute hemorrhage or mass effect. The midline  structures are normal. Old left external capsule infarct. Early confluent hyperintense T2-weighted signal of the periventricular and deep white matter, most commonly due to chronic ischemic microangiopathy. Generalized atrophy without lobar predilection. Susceptibility-sensitive sequences show no chronic microhemorrhage or superficial siderosis. VASCULAR: Major intracranial arterial and venous sinus flow voids are preserved. SKULL AND UPPER CERVICAL SPINE: The visualized skull base, calvarium, upper cervical spine and extracranial soft tissues are normal. SINUSES/ORBITS: No fluid levels or advanced mucosal thickening. No mastoid or middle ear effusion. The orbits are normal. IMPRESSION: 1. No acute intracranial abnormality. 2. Old left external capsule small vessel infarct and sequelae of chronic microvascular ischemia. 3. Generalized atrophy. Electronically Signed   By: Ulyses Jarred M.D.   On: 11/19/2017 01:54   US Renal  Result Date: 11/20/2017 CLINICAL DATA:  Acute kidney injury EXAM: RENAL / URINARY TRACT ULTRASOUND COMPLETE COMPARISON:  Abdomen and pelvis CT 02/17/2016 FINDINGS: Right Kidney: Length: 10 cm. Diffuse increased cortical echogenicity with mild thinning. There is multiple cysts measuring up to 2.8 cm, simple appearing. No hydronephrosis or evidence solid mass Left Kidney: Length: 10 cm. Diffuse increased cortical echogenicity with mild thinning. Multiple cysts, at least 5, measuring up to 4.4 cm. None show concerning complexity. Renal cysts were characterized by renal CT in 2017. Bladder: Appears normal for degree of bladder distention. IMPRESSION: 1. Medical renal disease without acute finding or hydronephrosis. 2. Known renal cysts. Electronically Signed   By: Monte Fantasia M.D.   On: 11/20/2017 12:13   Dg Abd 2 Views  Result Date: 11/19/2017 CLINICAL DATA:  Patient poor historian at this time. Encounter for emesis. Hx kidney stones. EXAM: ABDOMEN - 2 VIEW COMPARISON:  CT 02/17/2016  FINDINGS: Coarse bibasilar interstitial markings left worse than right. No pleural effusion. Heart size upper limits normal. No free air. Normal bowel gas pattern. Pelvic phleboliths and vascular calcifications. No unexplained abdominal calcifications. Spondylitic changes in the lower lumbar spine. IMPRESSION: 1. Normal bowel gas pattern.  No free air. Electronically Signed   By: Lucrezia Europe M.D.   On: 11/19/2017 09:52     Back still bothers him -cannot stand for long periods of time  Unsure if it helped a lot  Legs feel better   Cr is still high  Got a lot of IVF in the hospital  Does not think he gets 64 oz per day (perhaps around 40)  Scheduled with nephrology at the end of sept   Patient Active Problem List   Diagnosis Date Noted  . Confusion 11/19/2017  . Neurogenic claudication 10/05/2017  . Preoperative examination 09/26/2017  . H/O falling 05/02/2017  . Elevated serum creatinine 05/02/2017  . Depression 11/12/2016  . Right shoulder injury 05/26/2016  . Pain in joint, shoulder region 04/02/2016  . BPH associated with nocturia 02/17/2016  . Mass in the abdomen 02/09/2016  . Disequilibrium 01/29/2016  . Unsteadiness 01/29/2016  . Family history of prostate cancer 01/07/2016  . Hypothyroidism 01/07/2016  . S/P total knee arthroplasty 12/03/2015  . Routine general medical examination at a health care facility 08/08/2015  . Spinal stenosis in cervical region 02/05/2015  . Cervical spinal stenosis 02/02/2015  . Left shoulder pain 12/06/2014  . Cardiomyopathy, dilated ([place]) 09/16/2014  . Cerebral vascular accident ([place]) 09/16/2014  . Heart valve disease 09/16/2014  . MI (mitral incompetence) 08/08/2014  . TI (  tricuspid incompetence) 08/08/2014  . Atrial fibrillation, chronic ([place]) 01/16/2014  . Chronic systolic heart failure ([place]) 01/16/2014  . Bunion, right foot 08/17/2013  . Macular degeneration 10/16/2012  . Encounter for Medicare annual wellness exam 08/16/2012  .  Hearing decreased 12/16/2010  . Medication adverse effect 07/28/2010  . Prostate cancer screening 07/28/2010  . Long term (current) use of anticoagulants 07/22/2010  . Hyperlipidemia 06/01/2010  . ATRIAL FLUTTER 11/28/2009  . Bipolar disorder ([place]) 02/20/2009   Past Medical History:  Diagnosis Date  . Anxiety   . Arrhythmia   . Atrial fibrillation ([place])   . Basal cell cancer 2008   on shoulder  . Bipolar disorder [facility])    hospitalized in past  . Cardiomyopathy ([place])   . CHF (congestive heart failure) ([place])   . CVA (cerebral vascular accident) ([place]) 8/11  . Depression   . Dyslipidemia   . Generalized anxiety disorder   . GERD (gastroesophageal reflux disease)   . History of kidney stones    30 years ago  . Hypothyroidism   . Macular degeneration   . Melanoma ([place])    resected from scalp approximately 1 year ago.   . Osteoarthritis    in neck and left knee  . Tremor    noted when writing  . Valvular heart disease    Past Surgical History:  Procedure Laterality Date  . BACK SURGERY  1991   L4-5  . CATARACT EXTRACTION W/ INTRAOCULAR LENS IMPLANT Bilateral   . EYE SURGERY    . JOINT REPLACEMENT    . KNEE ARTHROPLASTY Left 12/03/2015   Procedure: COMPUTER ASSISTED TOTAL KNEE ARTHROPLASTY;  Surgeon: Dereck Leep, MD;  Location: [facility] ORS;  Service: Orthopedics;  Laterality: Left;  . LUMBAR LAMINECTOMY/DECOMPRESSION MICRODISCECTOMY N/A 10/05/2017   Procedure: LUMBAR LAMINECTOMY/DECOMPRESSION MICRODISCECTOMY 3 LEVELS L2-5, CORPECTOMY;  Surgeon: Meade Maw, MD;  Location: [facility] ORS;  Service: Neurosurgery;  Laterality: N/A;  . SKIN CANCER EXCISION     melenoma on scalp   Social History   Tobacco Use  . Smoking status: Never Smoker  . Smokeless tobacco: Never Used  Substance Use Topics  . Alcohol use: No    Alcohol/week: 0.0 standard drinks  . Drug use: No   Family History  Problem Relation Age of Onset  . Stroke Father   . Heart attack Father   . Cancer  Brother        prostate  . Coronary artery disease Brother   . Hypertension Brother    Allergies  Allergen Reactions  . Baclofen     Confusion/delerium   Current Outpatient Medications on File Prior to Visit  Medication Sig Dispense Refill  . Acetaminophen 500 MG coapsule Take 2 capsules by mouth every 6 (six) hours as needed for pain.     . Amino Acids (FREE FORM AMINO ACID COMPLEX PO) Take 1 capsule by mouth 2 (two) times daily.     . Ascorbic Acid (VITAMIN C) 500 MG tablet Take 500 mg by mouth 2 (two) times daily.     . B Complex Vitamins (B COMPLEX PO) Take 1 tablet by mouth 2 (two) times daily.     . Calcium-Magnesium-Vitamin D (CALCIUM 500 PO) Take 2 tablets by mouth daily.    . Cyanocobalamin (VITAMIN B-12) 1000 MCG SUBL Place 1 tablet under the tongue daily.     . diclofenac sodium (VOLTAREN) 1 % GEL Apply 2 g topically 3 (three) times daily as needed (pain).     [name] Kitchen docusate  sodium (COLACE) 100 MG capsule Take 1 capsule (100 mg total) by mouth 2 (two) times daily. 10 capsule 0  . escitalopram (LEXAPRO) 20 MG tablet Take 0.5 tablets (10 mg total) by mouth every morning. 30 tablet 2  . furosemide (LASIX) 20 MG tablet Take 20 mg by mouth daily as needed for fluid.     [name] Kitchen L-Methylfolate-Algae (DEPLIN 15) 15-90.314 MG CAPS Take 15 mg by mouth every morning. 28 capsule 0  . loperamide (IMODIUM) 2 MG capsule Take 2 mg by mouth as needed for diarrhea or loose stools.    [name] Kitchen MELATONIN PO Take 2 tablets by mouth at bedtime. Restful Sleep by [facility] : Hops Extract 4:1 (Humulus lupulus) (flower) Chamomile (Matricaria chamomilla) (flower) Passion Flower Extract (standardized to 4% vitexin) (Passiflora incarnate) (flower) 5 HTP (5-Hydroxtryptophan) (Griffonia simplicifolia) (seed)    . metoprolol succinate (TOPROL-XL) 100 MG 24 hr tablet Take 1 tablet (100 mg total) by mouth daily. Take with or immediately following a meal. 90 tablet 3  . Multiple Vitamin (MULTIVITAMIN) capsule Take 1 capsule by  mouth daily.    . ondansetron (ZOFRAN ODT) 4 MG disintegrating tablet Take 1 tablet (4 mg total) by mouth every 8 (eight) hours as needed for nausea or vomiting. 20 tablet 0  . sacubitril-valsartan (ENTRESTO) 24-26 MG Take 1 tablet by mouth 2 (two) times daily. 60 tablet 3  . Specialty Vitamins Products (ICAPS LUTEIN-ZEAXANTHIN PO) Take 2 capsules by mouth daily.    [name] Kitchen Specialty Vitamins Products (PROSTATE PO) Take 1 tablet by mouth daily.     [name] Kitchen warfarin (COUMADIN) 3 MG tablet Take 1 tablet (3 mg total) by mouth daily. (Patient taking differently: Take 2 mg by mouth daily. ) 30 tablet 2  . polyethylene glycol (MIRALAX / GLYCOLAX) packet Take 17 g by mouth daily. (Patient not taking: Reported on 12/06/2017) 14 each 0  . senna (SENOKOT) 8.6 MG TABS tablet Take 1 tablet (8.6 mg total) by mouth at bedtime. (Patient not taking: Reported on 12/06/2017) 120 each 0  . traMADol (ULTRAM) 50 MG tablet Take 1 tablet (50 mg total) by mouth every 6 (six) hours as needed. (Patient not taking: Reported on 12/06/2017) 20 tablet 0   No current facility-administered medications on file prior to visit.     Review of Systems  Constitutional: Positive for fatigue. Negative for activity change, appetite change, fever and unexpected weight change.  HENT: Negative for congestion, rhinorrhea, sore throat and trouble swallowing.   Eyes: Negative for pain, redness, itching and visual disturbance.  Respiratory: Negative for cough, chest tightness, shortness of breath and wheezing.   Cardiovascular: Negative for chest pain and palpitations.  Gastrointestinal: Negative for abdominal pain, blood in stool, constipation, diarrhea and nausea.  Endocrine: Negative for cold intolerance, heat intolerance, polydipsia and polyuria.  Genitourinary: Negative for difficulty urinating, dysuria, frequency and urgency.  Musculoskeletal: Positive for back pain and gait problem. Negative for arthralgias, joint swelling and myalgias.  Skin:  Negative for pallor and rash.  Neurological: Negative for dizziness, tremors, syncope, facial asymmetry, speech difficulty, weakness, light-headedness, numbness and headaches.  Hematological: Negative for adenopathy. Does not bruise/bleed easily.  Psychiatric/Behavioral: Negative for decreased concentration and dysphoric mood. The patient is not nervous/anxious.        Objective:   Physical Exam  Constitutional: He appears well-developed and well-nourished. No distress.  Well appearing   HENT:  Head: Normocephalic and atraumatic.  Mouth/Throat: Oropharynx is clear and moist.  Eyes: Pupils are equal, round, and reactive to light. Conjunctivae  and EOM are normal.  Neck: Normal range of motion. Neck supple. No JVD present. Carotid bruit is not present. No thyromegaly present.  Cardiovascular: Normal rate, normal heart sounds and intact distal pulses. Exam reveals no gallop.  Pulmonary/Chest: Effort normal and breath sounds normal. No stridor. No respiratory distress. He has no wheezes. He has no rales.  No crackles  Abdominal: Soft. Bowel sounds are normal. He exhibits no distension, no abdominal bruit and no mass. There is no tenderness.  No cva tenderness   Musculoskeletal: He exhibits no edema or tenderness.  Limited rom of LS and gait is slow with walker  Lymphadenopathy:    He has no cervical adenopathy.  Neurological: He is alert. He has normal reflexes. He displays no tremor and normal reflexes. No cranial nerve deficit. Coordination normal.  Skin: Skin is warm and dry. No rash noted.  Psychiatric: His speech is normal. His affect is blunt.  Baseline blunted affect           Assessment & Plan:   Problem List Items Addressed This Visit      Nervous and Auditory   Confusion - Primary    S/p hospitalization for delirium thought to be caused by Baclofen (after back surgery)  Reviewed hospital records, lab results and studies in detail   Much improve off medication    Reassuring exam  Will f/u with renal for renal insuff/ AKI at admission He is drinking fluids/avoids nephrotoxins  Enc to update if any return of symptoms         Other   Elevated serum creatinine    Worsened while in hospital for delirium Reviewed hospital records, lab results and studies in detail   Renal US showed signs of medical renal dz with multiple cysts  Has f/u for outpt nephrology next month

## 2017-12-06 NOTE — Assessment & Plan Note (Signed)
Worsened while in hospital for delirium Reviewed hospital records, lab results and studies in detail   Renal US showed signs of medical renal dz with multiple cysts  Has f/u for outpt nephrology next month

## 2017-12-06 NOTE — Patient Outreach (Signed)
[facility]) Care Management  12/06/2017  SAVINO WHISENANT 11-09-1930 [id number]     Transition of Care Referral  Referral Date: 12/06/17 Referral Source: [facility] Discharge Report Date of Admission: unknown Diagnosis: unknown Date of Discharge: 12/02/17 Facility: [place]: HTA    Outreach attempt # 1 to patient. No answer at present and unable to leave voicemail message.     Plan: RN CM will make outreach attempt to patient within 3-4 business days. RN CM will send unsuccessful outreach letter to patient.    Enzo Montgomery, RN,BSN,CCM [facility] Telephonic Care Management Coordinator Direct Phone: [phone] Toll Free: [phone] Fax: [phone]

## 2017-12-06 NOTE — Patient Instructions (Addendum)
Drink water the best you can  Ultimately 64 oz of fluids per day-mostly water  This is for kidney health   Follow up with the kidney doctor as planned next month   Let me know if anything changes

## 2017-12-06 NOTE — Assessment & Plan Note (Signed)
S/p hospitalization for delirium thought to be caused by Baclofen (after back surgery)  Reviewed hospital records, lab results and studies in detail   Much improve off medication  Reassuring exam  Will f/u with renal for renal insuff/ AKI at admission He is drinking fluids/avoids nephrotoxins  Enc to update if any return of symptoms

## 2017-12-07 ENCOUNTER — Other Ambulatory Visit: Payer: Self-pay

## 2017-12-07 NOTE — Patient Outreach (Signed)
[facility]) Care Management  12/07/2017  David Duncan Sep 21, 1930 [id number]   Transition of Care Referral  Referral Date: 12/06/17 Referral Source: [facility] Discharge Report Date of Admission: unknown Diagnosis: unknown Date of Discharge: 12/02/17 Facility: [place]: HTA   Outreach attempt #2 to patient. Spoke with spouse (ROI on file). She voices that patient is still asleep and she assists him with his medical affairs. She voices that patient is doing very well since return home. Spouse was on business phone so could only talk briefly. She confirmed that patient has already had follow up appt with PCP on yesterday. No issues with transportation. He has all his meds. She denies any issues or concerns regarding them but unable to review over the phone at this time. [facility] services discussed and reviewed. She voices that patient is doing well and does not need any [facility] services at this time. Advised that RN CM has already mailed out [facility] brochure and contact info for them to review and to call for any future needs or concerns. She voiced understanding and was appreciative of call.      Plan: RN CM will close case at this time as no further interventions needed  Enzo Montgomery, RN,BSN,CCM [place] Management Coordinator Direct Phone: [phone] Toll Free: [phone] Fax: [phone]

## 2017-12-18 DIAGNOSIS — M4802 Spinal stenosis, cervical region: Secondary | ICD-10-CM | POA: Diagnosis not present

## 2017-12-18 DIAGNOSIS — H3530 Unspecified macular degeneration: Secondary | ICD-10-CM | POA: Diagnosis not present

## 2017-12-18 DIAGNOSIS — I5022 Chronic systolic (congestive) heart failure: Secondary | ICD-10-CM | POA: Diagnosis not present

## 2017-12-18 DIAGNOSIS — F411 Generalized anxiety disorder: Secondary | ICD-10-CM | POA: Diagnosis not present

## 2017-12-18 DIAGNOSIS — Z7901 Long term (current) use of anticoagulants: Secondary | ICD-10-CM | POA: Diagnosis not present

## 2017-12-18 DIAGNOSIS — K5900 Constipation, unspecified: Secondary | ICD-10-CM | POA: Diagnosis not present

## 2017-12-18 DIAGNOSIS — Z8673 Personal history of transient ischemic attack (TIA), and cerebral infarction without residual deficits: Secondary | ICD-10-CM | POA: Diagnosis not present

## 2017-12-18 DIAGNOSIS — Z9181 History of falling: Secondary | ICD-10-CM | POA: Diagnosis not present

## 2017-12-18 DIAGNOSIS — Z96652 Presence of left artificial knee joint: Secondary | ICD-10-CM | POA: Diagnosis not present

## 2017-12-18 DIAGNOSIS — N400 Enlarged prostate without lower urinary tract symptoms: Secondary | ICD-10-CM | POA: Diagnosis not present

## 2017-12-18 DIAGNOSIS — I4892 Unspecified atrial flutter: Secondary | ICD-10-CM | POA: Diagnosis not present

## 2017-12-18 DIAGNOSIS — F319 Bipolar disorder, unspecified: Secondary | ICD-10-CM | POA: Diagnosis not present

## 2017-12-18 DIAGNOSIS — Z5181 Encounter for therapeutic drug level monitoring: Secondary | ICD-10-CM | POA: Diagnosis not present

## 2017-12-18 DIAGNOSIS — I420 Dilated cardiomyopathy: Secondary | ICD-10-CM | POA: Diagnosis not present

## 2017-12-18 DIAGNOSIS — M47812 Spondylosis without myelopathy or radiculopathy, cervical region: Secondary | ICD-10-CM | POA: Diagnosis not present

## 2017-12-18 DIAGNOSIS — Z85828 Personal history of other malignant neoplasm of skin: Secondary | ICD-10-CM | POA: Diagnosis not present

## 2017-12-18 DIAGNOSIS — I4891 Unspecified atrial fibrillation: Secondary | ICD-10-CM | POA: Diagnosis not present

## 2017-12-18 DIAGNOSIS — M48062 Spinal stenosis, lumbar region with neurogenic claudication: Secondary | ICD-10-CM | POA: Diagnosis not present

## 2017-12-18 DIAGNOSIS — Z79891 Long term (current) use of opiate analgesic: Secondary | ICD-10-CM | POA: Diagnosis not present

## 2017-12-18 DIAGNOSIS — N183 Chronic kidney disease, stage 3 (moderate): Secondary | ICD-10-CM | POA: Diagnosis not present

## 2018-01-05 DIAGNOSIS — I509 Heart failure, unspecified: Secondary | ICD-10-CM | POA: Diagnosis not present

## 2018-01-05 DIAGNOSIS — Z7901 Long term (current) use of anticoagulants: Secondary | ICD-10-CM | POA: Diagnosis not present

## 2018-01-09 DIAGNOSIS — N183 Chronic kidney disease, stage 3 (moderate): Secondary | ICD-10-CM | POA: Diagnosis not present

## 2018-01-09 DIAGNOSIS — N179 Acute kidney failure, unspecified: Secondary | ICD-10-CM | POA: Diagnosis not present

## 2018-01-09 DIAGNOSIS — I129 Hypertensive chronic kidney disease with stage 1 through stage 4 chronic kidney disease, or unspecified chronic kidney disease: Secondary | ICD-10-CM | POA: Diagnosis not present

## 2018-01-09 DIAGNOSIS — R809 Proteinuria, unspecified: Secondary | ICD-10-CM | POA: Diagnosis not present

## 2018-01-17 DIAGNOSIS — R791 Abnormal coagulation profile: Secondary | ICD-10-CM | POA: Diagnosis not present

## 2018-01-17 IMAGING — MR MR LUMBAR SPINE W/O CM
5 series · 36 of 48 positions shown · non-contrast
Comparison: None.

CLINICAL DATA: Low back pain. Prior surgery 2802. No radicular
symptoms. Left-sided pain going on for 5 years.

EXAM:
MRI LUMBAR SPINE WITHOUT CONTRAST
TECHNIQUE: Multiplanar, multisequence MR imaging of the lumbar spine was
performed. No intravenous contrast was administered.

[Series 3: T2 · sagittal · 4.0mm · 0.81mm/px · 6 of 17 slices shown (1 of 2)]
[im 1/17]
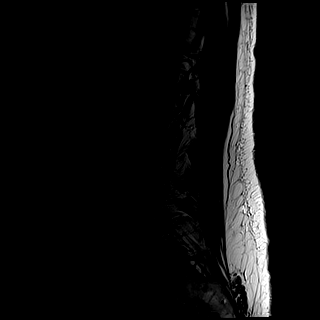
[im 4/17]
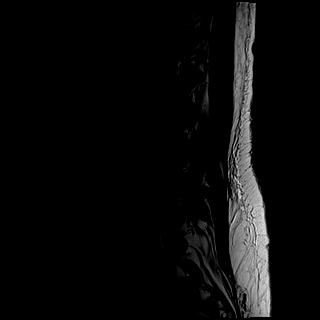
[im 7/17]
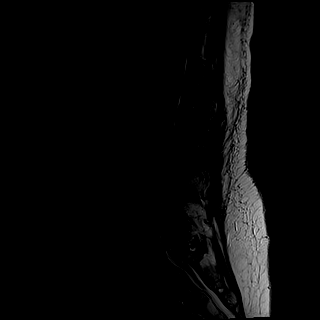
[im 10/17]
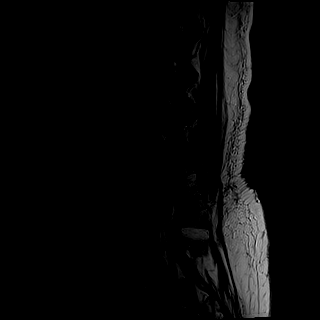
[im 13/17]
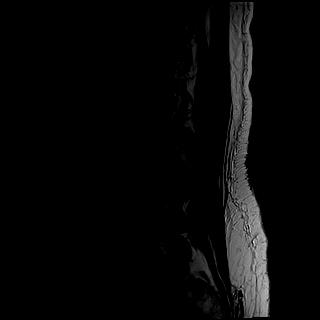
[im 17/17]
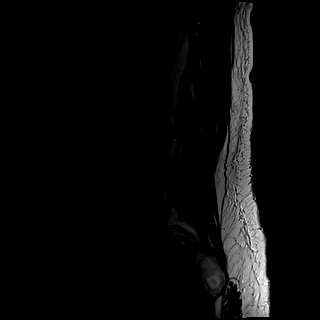

[Series 4: T1 · sagittal · 4.0mm · 0.81mm/px · 7 of 17 slices shown (1 of 2)]
[im 1/17]
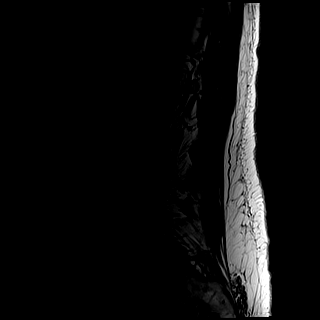
[im 3/17]
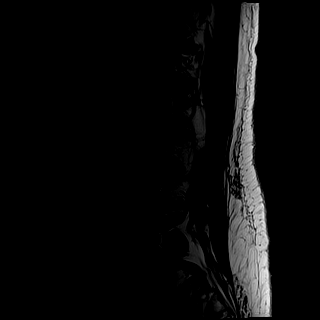
[im 6/17]
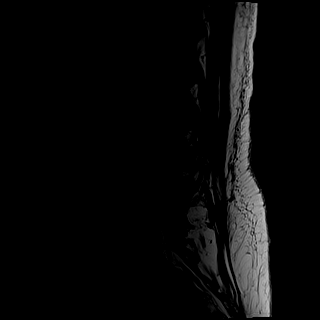
[im 9/17]
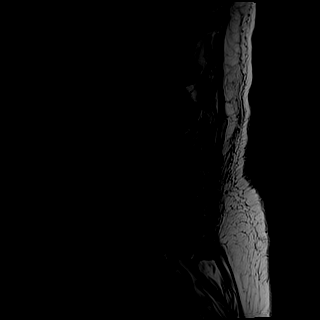
[im 11/17]
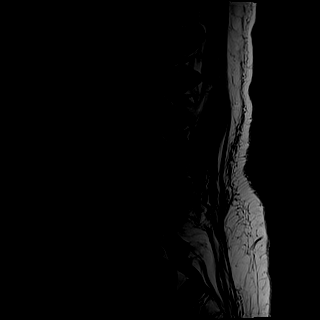
[im 14/17]
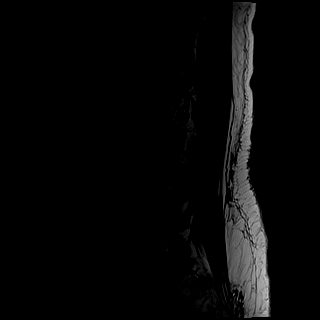
[im 17/17]
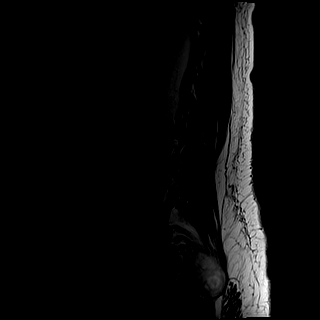

[Series 5: STIR · sagittal · 4.0mm · 1.02mm/px · 7 of 17 slices shown]
[im 1/17]
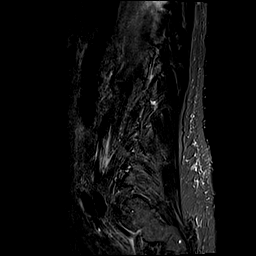
[im 3/17]
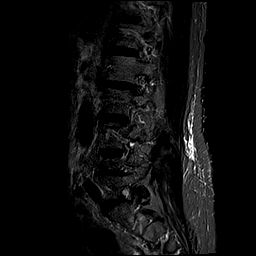
[im 6/17]
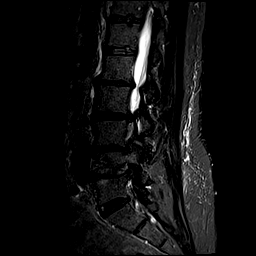
[im 9/17]
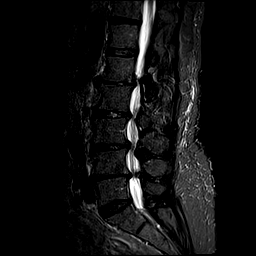
[im 11/17]
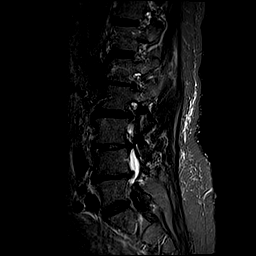
[im 14/17]
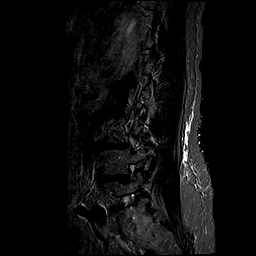
[im 17/17]
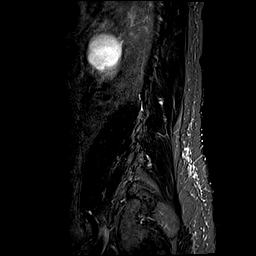

[Series 6: T2 · axial · 4.0mm · 0.78mm/px · z∈[-128,+63]mm · 8 of 37 slices shown (2 of 2)]
[im 1/37]
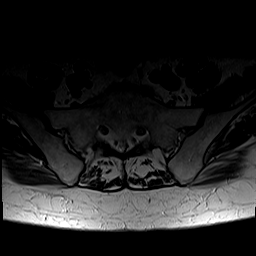
[im 6/37]
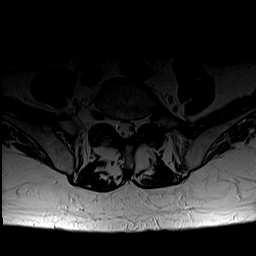
[im 12/37]
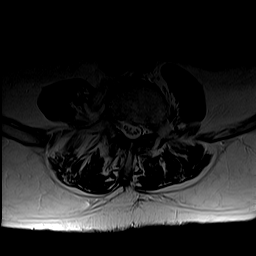
[im 17/37]
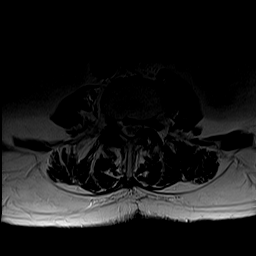
[im 20/37]
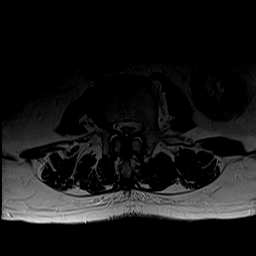
[im 25/37]
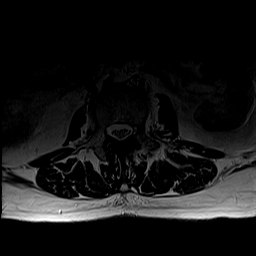
[im 31/37]
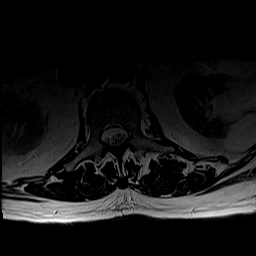
[im 37/37]
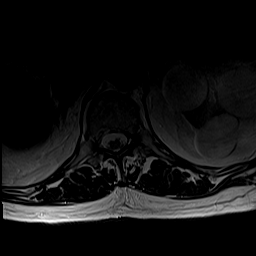

[Series 7: T1 · axial · 4.0mm · 0.39mm/px · z∈[-128,+63]mm · 8 of 37 slices shown (2 of 2)]
[im 1/37]
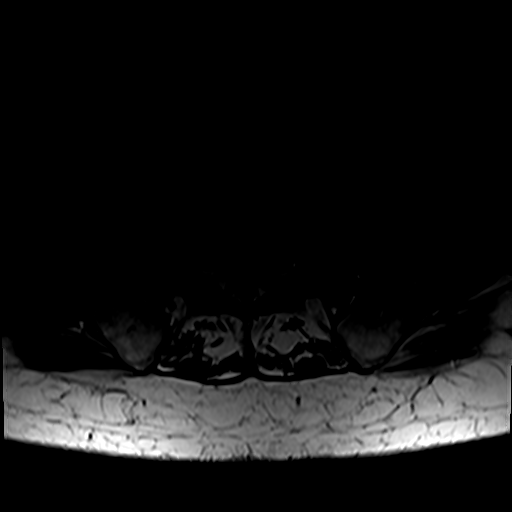
[im 6/37]
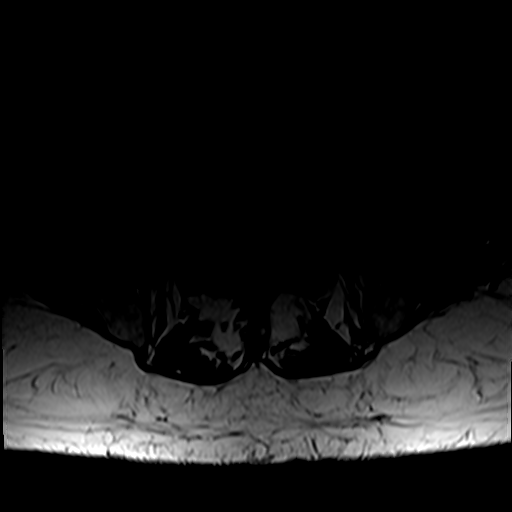
[im 12/37]
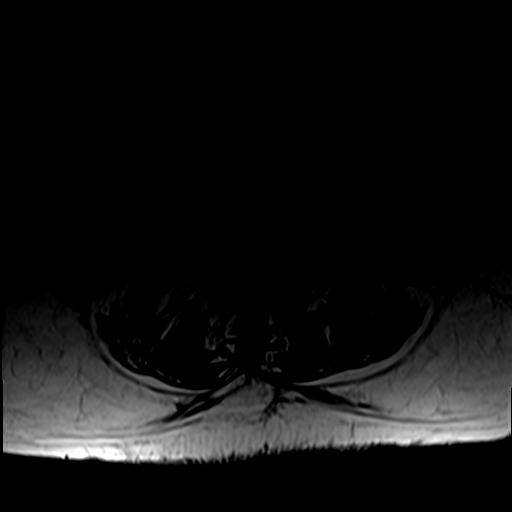
[im 17/37]
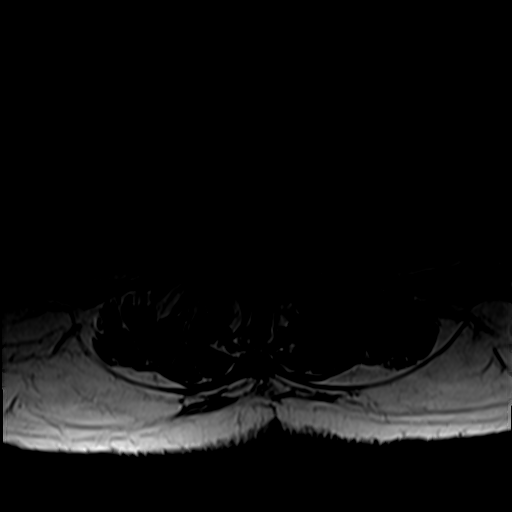
[im 20/37]
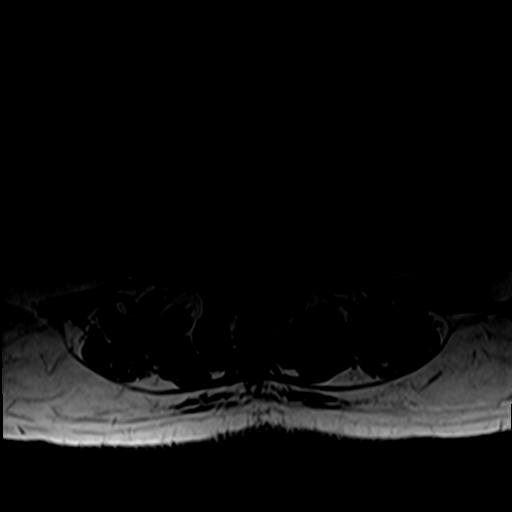
[im 25/37]
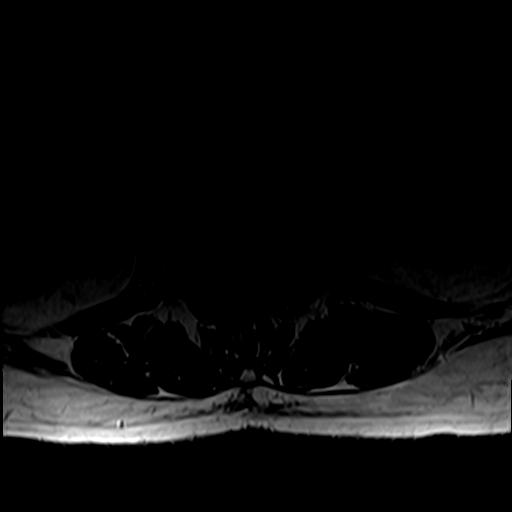
[im 31/37]
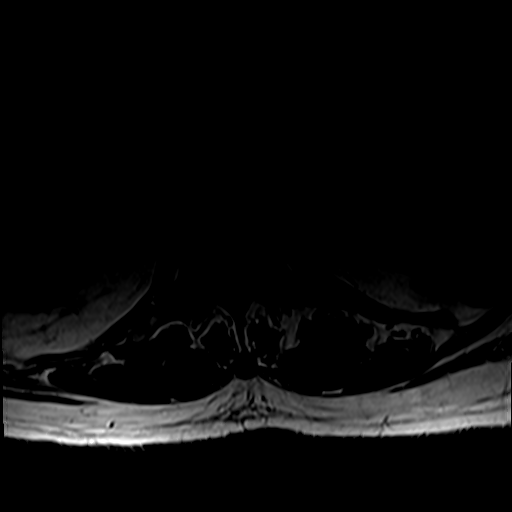
[im 37/37]
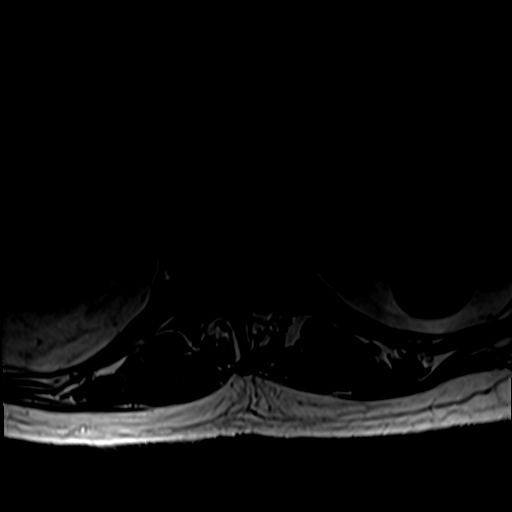

[36 of 48 positions shown; findings below may reference images not displayed]

FINDINGS: Segmentation:  Standard.

Alignment:  Physiologic.

Vertebrae:  No fracture, evidence of discitis, or bone lesion.

Conus medullaris: Extends to the L1 level and appears normal.

Paraspinal and other soft tissues: No paraspinal abnormality.
Multiple T2 hyperintense bilateral renal masses most consistent with
cysts. 6.6 cm indeterminate right suprarenal mass.

Disc levels:

Disc spaces: Degenerative disc disease with disc height loss
throughout the lumbar spine.

T12-L1: No significant disc bulge. No evidence of neural foraminal
stenosis. No central canal stenosis.

L1-L2: Broad-based disc bulge with a left paracentral disc
protrusion. Moderate bilateral facet arthropathy. Bilateral lateral
recess stenosis and moderate spinal stenosis. No evidence of neural
foraminal stenosis.

L2-L3: Moderate broad-based disc bulge. Moderate bilateral facet
arthropathy. Severe spinal stenosis and bilateral lateral recess
stenosis. No evidence of neural foraminal stenosis.

L3-L4: Broad-based disc bulge with a small right paracentral disc
protrusion. Severe bilateral facet arthropathy with ligamentum
flavum infolding resulting in severe spinal stenosis and bilateral
lateral recess stenosis. Mild bilateral foraminal narrowing.

L4-L5: Broad-based disc bulge. Moderate bilateral facet arthropathy.
Moderate -severe spinal stenosis with bilateral lateral recess
stenosis. Mild bilateral foraminal stenosis.

L5-S1: Mild broad-based disc bulge. No evidence of neural foraminal
stenosis. No central canal stenosis.
IMPRESSION: 1. At L2-3 there is a moderate broad-based disc bulge. Moderate
bilateral facet arthropathy. Severe spinal stenosis and bilateral
lateral recess stenosis.
2. At L3-4 there is a broad-based disc bulge with a small right
paracentral disc protrusion. Severe bilateral facet arthropathy with
ligamentum flavum infolding resulting in severe spinal stenosis and
bilateral lateral recess stenosis. Mild bilateral foraminal
narrowing.
3. At L4-5 there is a broad-based disc bulge. Moderate bilateral
facet arthropathy. Moderate-severe spinal stenosis with bilateral
lateral recess stenosis. Mild bilateral foraminal stenosis.
4. At L1-2 there is a broad-based disc bulge with a left paracentral
disc protrusion. Moderate bilateral facet arthropathy. Bilateral
lateral recess stenosis and moderate spinal stenosis.
5. 6.6 cm indeterminate right suprarenal mass. Recommend further
evaluation with a CT or MRI of the abdomen.

## 2018-01-26 DIAGNOSIS — R791 Abnormal coagulation profile: Secondary | ICD-10-CM | POA: Diagnosis not present

## 2018-02-16 DIAGNOSIS — Z7901 Long term (current) use of anticoagulants: Secondary | ICD-10-CM | POA: Diagnosis not present

## 2018-02-20 DIAGNOSIS — H353133 Nonexudative age-related macular degeneration, bilateral, advanced atrophic without subfoveal involvement: Secondary | ICD-10-CM | POA: Diagnosis not present

## 2018-02-20 DIAGNOSIS — H524 Presbyopia: Secondary | ICD-10-CM | POA: Diagnosis not present

## 2018-02-20 DIAGNOSIS — Z9842 Cataract extraction status, left eye: Secondary | ICD-10-CM | POA: Diagnosis not present

## 2018-02-20 DIAGNOSIS — H353221 Exudative age-related macular degeneration, left eye, with active choroidal neovascularization: Secondary | ICD-10-CM | POA: Diagnosis not present

## 2018-02-20 DIAGNOSIS — Z9841 Cataract extraction status, right eye: Secondary | ICD-10-CM | POA: Diagnosis not present

## 2018-02-20 DIAGNOSIS — H353114 Nonexudative age-related macular degeneration, right eye, advanced atrophic with subfoveal involvement: Secondary | ICD-10-CM | POA: Diagnosis not present

## 2018-02-26 DIAGNOSIS — Z8673 Personal history of transient ischemic attack (TIA), and cerebral infarction without residual deficits: Secondary | ICD-10-CM | POA: Diagnosis not present

## 2018-02-26 DIAGNOSIS — Z85828 Personal history of other malignant neoplasm of skin: Secondary | ICD-10-CM | POA: Diagnosis not present

## 2018-02-26 DIAGNOSIS — M4802 Spinal stenosis, cervical region: Secondary | ICD-10-CM | POA: Diagnosis not present

## 2018-02-26 DIAGNOSIS — F319 Bipolar disorder, unspecified: Secondary | ICD-10-CM | POA: Diagnosis not present

## 2018-02-26 DIAGNOSIS — M47812 Spondylosis without myelopathy or radiculopathy, cervical region: Secondary | ICD-10-CM | POA: Diagnosis not present

## 2018-02-26 DIAGNOSIS — I5022 Chronic systolic (congestive) heart failure: Secondary | ICD-10-CM | POA: Diagnosis not present

## 2018-02-26 DIAGNOSIS — I420 Dilated cardiomyopathy: Secondary | ICD-10-CM | POA: Diagnosis not present

## 2018-02-26 DIAGNOSIS — M1991 Primary osteoarthritis, unspecified site: Secondary | ICD-10-CM | POA: Diagnosis not present

## 2018-02-26 DIAGNOSIS — F411 Generalized anxiety disorder: Secondary | ICD-10-CM | POA: Diagnosis not present

## 2018-02-26 DIAGNOSIS — Z9181 History of falling: Secondary | ICD-10-CM | POA: Diagnosis not present

## 2018-02-26 DIAGNOSIS — H3530 Unspecified macular degeneration: Secondary | ICD-10-CM | POA: Diagnosis not present

## 2018-02-26 DIAGNOSIS — M48062 Spinal stenosis, lumbar region with neurogenic claudication: Secondary | ICD-10-CM | POA: Diagnosis not present

## 2018-02-26 DIAGNOSIS — Z7901 Long term (current) use of anticoagulants: Secondary | ICD-10-CM | POA: Diagnosis not present

## 2018-02-26 DIAGNOSIS — I4891 Unspecified atrial fibrillation: Secondary | ICD-10-CM | POA: Diagnosis not present

## 2018-02-26 DIAGNOSIS — N183 Chronic kidney disease, stage 3 (moderate): Secondary | ICD-10-CM | POA: Diagnosis not present

## 2018-02-26 DIAGNOSIS — N400 Enlarged prostate without lower urinary tract symptoms: Secondary | ICD-10-CM | POA: Diagnosis not present

## 2018-02-26 DIAGNOSIS — Z96652 Presence of left artificial knee joint: Secondary | ICD-10-CM | POA: Diagnosis not present

## 2018-02-26 DIAGNOSIS — I4892 Unspecified atrial flutter: Secondary | ICD-10-CM | POA: Diagnosis not present

## 2018-03-06 DIAGNOSIS — H353113 Nonexudative age-related macular degeneration, right eye, advanced atrophic without subfoveal involvement: Secondary | ICD-10-CM | POA: Diagnosis not present

## 2018-03-06 DIAGNOSIS — H353221 Exudative age-related macular degeneration, left eye, with active choroidal neovascularization: Secondary | ICD-10-CM | POA: Diagnosis not present

## 2018-03-06 DIAGNOSIS — H35033 Hypertensive retinopathy, bilateral: Secondary | ICD-10-CM | POA: Diagnosis not present

## 2018-03-08 DIAGNOSIS — Z8673 Personal history of transient ischemic attack (TIA), and cerebral infarction without residual deficits: Secondary | ICD-10-CM | POA: Diagnosis not present

## 2018-03-08 DIAGNOSIS — Z7901 Long term (current) use of anticoagulants: Secondary | ICD-10-CM | POA: Diagnosis not present

## 2018-03-08 DIAGNOSIS — I4892 Unspecified atrial flutter: Secondary | ICD-10-CM | POA: Diagnosis not present

## 2018-03-08 DIAGNOSIS — M4802 Spinal stenosis, cervical region: Secondary | ICD-10-CM | POA: Diagnosis not present

## 2018-03-08 DIAGNOSIS — H3530 Unspecified macular degeneration: Secondary | ICD-10-CM | POA: Diagnosis not present

## 2018-03-08 DIAGNOSIS — F319 Bipolar disorder, unspecified: Secondary | ICD-10-CM | POA: Diagnosis not present

## 2018-03-08 DIAGNOSIS — M48062 Spinal stenosis, lumbar region with neurogenic claudication: Secondary | ICD-10-CM | POA: Diagnosis not present

## 2018-03-08 DIAGNOSIS — I5022 Chronic systolic (congestive) heart failure: Secondary | ICD-10-CM | POA: Diagnosis not present

## 2018-03-08 DIAGNOSIS — N400 Enlarged prostate without lower urinary tract symptoms: Secondary | ICD-10-CM | POA: Diagnosis not present

## 2018-03-08 DIAGNOSIS — I4891 Unspecified atrial fibrillation: Secondary | ICD-10-CM | POA: Diagnosis not present

## 2018-03-08 DIAGNOSIS — Z9181 History of falling: Secondary | ICD-10-CM | POA: Diagnosis not present

## 2018-03-08 DIAGNOSIS — N183 Chronic kidney disease, stage 3 (moderate): Secondary | ICD-10-CM | POA: Diagnosis not present

## 2018-03-08 DIAGNOSIS — F411 Generalized anxiety disorder: Secondary | ICD-10-CM | POA: Diagnosis not present

## 2018-03-08 DIAGNOSIS — M1991 Primary osteoarthritis, unspecified site: Secondary | ICD-10-CM | POA: Diagnosis not present

## 2018-03-08 DIAGNOSIS — Z96652 Presence of left artificial knee joint: Secondary | ICD-10-CM | POA: Diagnosis not present

## 2018-03-08 DIAGNOSIS — I420 Dilated cardiomyopathy: Secondary | ICD-10-CM | POA: Diagnosis not present

## 2018-03-08 DIAGNOSIS — Z85828 Personal history of other malignant neoplasm of skin: Secondary | ICD-10-CM | POA: Diagnosis not present

## 2018-03-08 DIAGNOSIS — M47812 Spondylosis without myelopathy or radiculopathy, cervical region: Secondary | ICD-10-CM | POA: Diagnosis not present

## 2018-03-13 DIAGNOSIS — Z8673 Personal history of transient ischemic attack (TIA), and cerebral infarction without residual deficits: Secondary | ICD-10-CM | POA: Diagnosis not present

## 2018-03-13 DIAGNOSIS — M48062 Spinal stenosis, lumbar region with neurogenic claudication: Secondary | ICD-10-CM | POA: Diagnosis not present

## 2018-03-13 DIAGNOSIS — N400 Enlarged prostate without lower urinary tract symptoms: Secondary | ICD-10-CM | POA: Diagnosis not present

## 2018-03-13 DIAGNOSIS — I4892 Unspecified atrial flutter: Secondary | ICD-10-CM | POA: Diagnosis not present

## 2018-03-13 DIAGNOSIS — Z85828 Personal history of other malignant neoplasm of skin: Secondary | ICD-10-CM | POA: Diagnosis not present

## 2018-03-13 DIAGNOSIS — M47812 Spondylosis without myelopathy or radiculopathy, cervical region: Secondary | ICD-10-CM | POA: Diagnosis not present

## 2018-03-13 DIAGNOSIS — Z7901 Long term (current) use of anticoagulants: Secondary | ICD-10-CM | POA: Diagnosis not present

## 2018-03-13 DIAGNOSIS — Z9181 History of falling: Secondary | ICD-10-CM | POA: Diagnosis not present

## 2018-03-13 DIAGNOSIS — N183 Chronic kidney disease, stage 3 (moderate): Secondary | ICD-10-CM | POA: Diagnosis not present

## 2018-03-13 DIAGNOSIS — F319 Bipolar disorder, unspecified: Secondary | ICD-10-CM | POA: Diagnosis not present

## 2018-03-13 DIAGNOSIS — H3530 Unspecified macular degeneration: Secondary | ICD-10-CM | POA: Diagnosis not present

## 2018-03-13 DIAGNOSIS — M1991 Primary osteoarthritis, unspecified site: Secondary | ICD-10-CM | POA: Diagnosis not present

## 2018-03-13 DIAGNOSIS — I420 Dilated cardiomyopathy: Secondary | ICD-10-CM | POA: Diagnosis not present

## 2018-03-13 DIAGNOSIS — M4802 Spinal stenosis, cervical region: Secondary | ICD-10-CM | POA: Diagnosis not present

## 2018-03-13 DIAGNOSIS — I5022 Chronic systolic (congestive) heart failure: Secondary | ICD-10-CM | POA: Diagnosis not present

## 2018-03-13 DIAGNOSIS — Z96652 Presence of left artificial knee joint: Secondary | ICD-10-CM | POA: Diagnosis not present

## 2018-03-13 DIAGNOSIS — I4891 Unspecified atrial fibrillation: Secondary | ICD-10-CM | POA: Diagnosis not present

## 2018-03-13 DIAGNOSIS — F411 Generalized anxiety disorder: Secondary | ICD-10-CM | POA: Diagnosis not present

## 2018-03-16 DIAGNOSIS — Z7901 Long term (current) use of anticoagulants: Secondary | ICD-10-CM | POA: Diagnosis not present

## 2018-03-23 DIAGNOSIS — M961 Postlaminectomy syndrome, not elsewhere classified: Secondary | ICD-10-CM | POA: Diagnosis not present

## 2018-04-03 DIAGNOSIS — Z7901 Long term (current) use of anticoagulants: Secondary | ICD-10-CM | POA: Diagnosis not present

## 2018-04-10 DIAGNOSIS — H353221 Exudative age-related macular degeneration, left eye, with active choroidal neovascularization: Secondary | ICD-10-CM | POA: Diagnosis not present

## 2018-04-10 DIAGNOSIS — H353113 Nonexudative age-related macular degeneration, right eye, advanced atrophic without subfoveal involvement: Secondary | ICD-10-CM | POA: Diagnosis not present

## 2018-04-10 DIAGNOSIS — H35033 Hypertensive retinopathy, bilateral: Secondary | ICD-10-CM | POA: Diagnosis not present

## 2018-04-17 DIAGNOSIS — R791 Abnormal coagulation profile: Secondary | ICD-10-CM | POA: Diagnosis not present

## 2018-05-08 DIAGNOSIS — N183 Chronic kidney disease, stage 3 (moderate): Secondary | ICD-10-CM | POA: Diagnosis not present

## 2018-05-08 DIAGNOSIS — I129 Hypertensive chronic kidney disease with stage 1 through stage 4 chronic kidney disease, or unspecified chronic kidney disease: Secondary | ICD-10-CM | POA: Diagnosis not present

## 2018-05-11 ENCOUNTER — Other Ambulatory Visit: Payer: Self-pay

## 2018-05-11 ENCOUNTER — Encounter: Payer: Self-pay | Admitting: Student in an Organized Health Care Education/Training Program

## 2018-05-11 ENCOUNTER — Ambulatory Visit
Payer: PPO | Attending: Student in an Organized Health Care Education/Training Program | Admitting: Student in an Organized Health Care Education/Training Program

## 2018-05-11 VITALS — BP 105/66 | HR 84 | Temp 97.6°F | Resp 16 | Ht 66.0 in | Wt 155.0 lb

## 2018-05-11 DIAGNOSIS — M961 Postlaminectomy syndrome, not elsewhere classified: Secondary | ICD-10-CM

## 2018-05-11 DIAGNOSIS — N183 Chronic kidney disease, stage 3 unspecified: Secondary | ICD-10-CM

## 2018-05-11 DIAGNOSIS — I482 Chronic atrial fibrillation, unspecified: Secondary | ICD-10-CM | POA: Diagnosis not present

## 2018-05-11 DIAGNOSIS — I4892 Unspecified atrial flutter: Secondary | ICD-10-CM | POA: Diagnosis not present

## 2018-05-11 DIAGNOSIS — G8929 Other chronic pain: Secondary | ICD-10-CM | POA: Diagnosis not present

## 2018-05-11 DIAGNOSIS — I38 Endocarditis, valve unspecified: Secondary | ICD-10-CM | POA: Diagnosis not present

## 2018-05-11 DIAGNOSIS — I5022 Chronic systolic (congestive) heart failure: Secondary | ICD-10-CM

## 2018-05-11 DIAGNOSIS — M48062 Spinal stenosis, lumbar region with neurogenic claudication: Secondary | ICD-10-CM

## 2018-05-11 DIAGNOSIS — Z9889 Other specified postprocedural states: Secondary | ICD-10-CM | POA: Diagnosis not present

## 2018-05-11 DIAGNOSIS — M5442 Lumbago with sciatica, left side: Secondary | ICD-10-CM

## 2018-05-11 DIAGNOSIS — M5441 Lumbago with sciatica, right side: Secondary | ICD-10-CM

## 2018-05-11 DIAGNOSIS — I420 Dilated cardiomyopathy: Secondary | ICD-10-CM | POA: Diagnosis not present

## 2018-05-11 DIAGNOSIS — I639 Cerebral infarction, unspecified: Secondary | ICD-10-CM

## 2018-05-11 MED ORDER — GABAPENTIN 300 MG PO CAPS: ORAL_CAPSULE | ORAL | 1 refills | Status: DC

## 2018-05-11 NOTE — Progress Notes (Signed)
Safety precautions to be maintained throughout the outpatient stay will include: orient to surroundings, keep bed in low position, maintain call bell within reach at all times, provide assistance with transfer out of bed and ambulation.  

## 2018-05-11 NOTE — Progress Notes (Signed)
Patient's Name: David Duncan  MRN: [id number]  Referring Provider: Meade Maw, MD  DOB: 1930-10-06  PCP: Abner Greenspan, MD  DOS: 05/11/2018  Note by: Gillis Santa, MD  Service setting: Ambulatory outpatient  Specialty: Interventional Pain Management  Location: [facility] (AMB) Pain Management Facility  Visit type: Initial Patient Evaluation  Patient type: New Patient   Primary Reason(s) for Visit: Encounter for initial evaluation of one or more chronic problems (new to examiner) potentially causing chronic pain, and posing a threat to normal musculoskeletal function. (Level of risk: High) CC: Back Pain (lower) and Shoulder Pain (bilateral)  HPI  David Duncan is a 85 y.o. year old, male patient, who comes today to see [place] for the first time for an initial evaluation of his chronic pain. He has Bipolar disorder ([place]); ATRIAL FLUTTER; Hyperlipidemia; Long term (current) use of anticoagulants; Medication adverse effect; Prostate cancer screening; Hearing decreased; Encounter for Medicare annual wellness exam; Macular degeneration; Bunion, right foot; Atrial fibrillation, chronic; Chronic systolic heart failure ([place]); Cardiomyopathy, dilated ([place]); Cerebral vascular accident [facility]); Heart valve disease; Left shoulder pain; MI (mitral incompetence); TI (tricuspid incompetence); Spinal stenosis in cervical region; Cervical spinal stenosis; Routine general medical examination at a health care facility; S/P total knee arthroplasty; Family history of prostate cancer; Hypothyroidism; Mass in the abdomen; BPH associated with nocturia; Disequilibrium; Unsteadiness; Pain in joint, shoulder region; Right shoulder injury; Depression; H/O falling; Elevated serum creatinine; Preoperative examination; Neurogenic claudication; and Confusion on their problem list. Today he comes in for evaluation of his Back Pain (lower) and Shoulder Pain (bilateral)  Pain Assessment: Location: Lower Back Radiating: denies Onset: More  than a month ago Duration: Chronic pain Quality: Aching Severity: 3 /10 (subjective, self-reported pain score)  Note: Reported level is compatible with observation.                         When using our objective Pain Scale, levels between 6 and 10/10 are said to belong in an emergency room, as it progressively worsens from a 6/10, described as severely limiting, requiring emergency care not usually available at an outpatient pain management facility. At a 6/10 level, communication becomes difficult and requires great effort. Assistance to reach the emergency department may be required. Facial flushing and profuse sweating along with potentially dangerous increases in heart rate and blood pressure will be evident. Effect on ADL: difficulty performing daily tasks Timing: Intermittent Modifying factors: sitting/rest BP: 105/66  HR: 84  Onset and Duration: Gradual and Present longer than 3 months Cause of pain: Unknown Severity: NAS-11 at its worse: 8/10, NAS-11 at its best: 4/10, NAS-11 now: 3/10 and NAS-11 on the average: 4/10 Timing: Not influenced by the time of the day and During activity or exercise Aggravating Factors: Bending, Kneeling, Lifiting, Prolonged standing, Twisting and Walking Alleviating Factors: Lying down, Medications, Resting, Sitting and Sleeping Associated Problems: Depression Quality of Pain: Aching, Agonizing, Distressing, Horrible, Stabbing, Throbbing and Uncomfortable Previous Examinations or Tests: CT scan, MRI scan, X-rays, Neurosurgical evaluation, Orthopedic evaluation and Chiropractic evaluation Previous Treatments: Chiropractic manipulations, Epidural steroid injections, Physical Therapy and Stretching exercises  The patient comes into the clinics today for the first time for a chronic pain management evaluation.   Patient is status post L2-L5 lumbar laminectomy and decompression and microdiscectomy.  Patient also has atrial fibrillation along with CHF and is  currently anticoagulated.  Patient describes his pain is primarily axial rather than appendicular.  It is fairly equal on both  sides.  He is referred here from Dr. Cari Caraway for discussion of possible spinal cord stimulator trial.  Patient is currently anticoagulated with warfarin.  Today I took the time to provide the patient with information regarding my pain practice. The patient was informed that my practice is divided into two sections: an interventional pain management section, as well as a completely separate and distinct medication management section. I explained that I have procedure days for my interventional therapies, and evaluation days for follow-ups and medication management. Because of the amount of documentation required during both, they are kept separated. This means that there is the possibility that he may be scheduled for a procedure on one day, and medication management the next. I have also informed him that because of staffing and facility limitations, I no longer take patients for medication management only. To illustrate the reasons for this, I gave the patient the example of surgeons, and how inappropriate it would be to refer a patient to his/her care, just to write for the post-surgical antibiotics on a surgery done by a different surgeon.   Because interventional pain management is my board-certified specialty, the patient was informed that joining my practice means that they are open to any and all interventional therapies. I made it clear that this does not mean that they will be forced to have any procedures done. What this means is that I believe interventional therapies to be essential part of the diagnosis and proper management of chronic pain conditions. Therefore, patients not interested in these interventional alternatives will be better served under the care of a different practitioner.  The patient was also made aware of my Comprehensive Pain Management Safety  Guidelines where by joining my practice, they limit all of their nerve blocks and joint injections to those done by our practice, for as long as we are retained to manage their care.   Meds   Current Outpatient Medications:  .  Acetaminophen 500 MG coapsule, Take 2 capsules by mouth every 6 (six) hours as needed for pain. , Disp: , Rfl:  .  Amino Acids (FREE FORM AMINO ACID COMPLEX PO), Take 1 capsule by mouth 2 (two) times daily. , Disp: , Rfl:  .  Apoaequorin (PREVAGEN) 10 MG CAPS, Take by mouth daily., Disp: , Rfl:  .  Ascorbic Acid (VITAMIN C) 500 MG tablet, Take 500 mg by mouth 2 (two) times daily. , Disp: , Rfl:  .  B Complex Vitamins (B COMPLEX PO), Take 1 tablet by mouth 2 (two) times daily. , Disp: , Rfl:  .  Cyanocobalamin (VITAMIN B-12) 1000 MCG SUBL, Place 1 tablet under the tongue daily. , Disp: , Rfl:  .  escitalopram (LEXAPRO) 20 MG tablet, Take 0.5 tablets (10 mg total) by mouth every morning., Disp: 30 tablet, Rfl: 2 .  furosemide (LASIX) 20 MG tablet, Take 20 mg by mouth daily as needed for fluid. , Disp: , Rfl:  .  L-Methylfolate-Algae (DEPLIN 15) 15-90.314 MG CAPS, Take 15 mg by mouth every morning., Disp: 28 capsule, Rfl: 0 .  loperamide (IMODIUM) 2 MG capsule, Take 2 mg by mouth as needed for diarrhea or loose stools., Disp: , Rfl:  .  MELATONIN PO, Take 2 tablets by mouth at bedtime. Restful Sleep by [facility] : Hops Extract 4:1 (Humulus lupulus) (flower) Chamomile (Matricaria chamomilla) (flower) Passion Flower Extract (standardized to 4% vitexin) (Passiflora incarnate) (flower) 5 HTP (5-Hydroxtryptophan) (Griffonia simplicifolia) (seed), Disp: , Rfl:  .  Multiple Vitamin (MULTIVITAMIN) capsule, Take  1 capsule by mouth daily., Disp: , Rfl:  .  polyethylene glycol (MIRALAX / GLYCOLAX) packet, Take 17 g by mouth daily., Disp: 14 each, Rfl: 0 .  sacubitril-valsartan (ENTRESTO) 24-26 MG, Take 1 tablet by mouth 2 (two) times daily., Disp: 60 tablet, Rfl: 3 .  Specialty Vitamins  Products (ICAPS LUTEIN-ZEAXANTHIN PO), Take 2 capsules by mouth daily., Disp: , Rfl:  .  Specialty Vitamins Products (PROSTATE PO), Take 1 tablet by mouth daily. , Disp: , Rfl:  .  warfarin (COUMADIN) 3 MG tablet, Take 1 tablet (3 mg total) by mouth daily. (Patient taking differently: Take 2 mg by mouth daily. ), Disp: 30 tablet, Rfl: 2 .  Calcium-Magnesium-Vitamin D (CALCIUM 500 PO), Take 2 tablets by mouth daily., Disp: , Rfl:  .  diclofenac sodium (VOLTAREN) 1 % GEL, Apply 2 g topically 3 (three) times daily as needed (pain). , Disp: , Rfl:  .  docusate sodium (COLACE) 100 MG capsule, Take 1 capsule (100 mg total) by mouth 2 (two) times daily. (Patient not taking: Reported on 05/11/2018), Disp: 10 capsule, Rfl: 0 .  gabapentin (NEURONTIN) 300 MG capsule, 300 mg qhs for 2-3 weeks and then increase to 300 mg BID if no side effects, Disp: 60 capsule, Rfl: 1 .  metoprolol succinate (TOPROL-XL) 100 MG 24 hr tablet, Take 1 tablet (100 mg total) by mouth daily. Take with or immediately following a meal., Disp: 90 tablet, Rfl: 3 .  ondansetron (ZOFRAN ODT) 4 MG disintegrating tablet, Take 1 tablet (4 mg total) by mouth every 8 (eight) hours as needed for nausea or vomiting. (Patient not taking: Reported on 05/11/2018), Disp: 20 tablet, Rfl: 0 .  senna (SENOKOT) 8.6 MG TABS tablet, Take 1 tablet (8.6 mg total) by mouth at bedtime. (Patient not taking: Reported on 12/06/2017), Disp: 120 each, Rfl: 0 .  traMADol (ULTRAM) 50 MG tablet, Take 1 tablet (50 mg total) by mouth every 6 (six) hours as needed. (Patient not taking: Reported on 12/06/2017), Disp: 20 tablet, Rfl: 0  Imaging Review  Cervical Imaging: Cervical MR wo contrast:  Results for orders placed during the hospital encounter of 11/04/14  MR Cervical Spine Wo Contrast   Narrative CLINICAL DATA:  Neck pain. Bilateral arm pain and numbness in the hands for sever years.  EXAM: MRI CERVICAL SPINE WITHOUT CONTRAST  TECHNIQUE: Multiplanar,  multisequence MR imaging of the cervical spine was performed. No intravenous contrast was administered.  COMPARISON:  Multiple exams, including 05/29/2014  FINDINGS: Chronic sphenoid sinusitis. The craniocervical junction appears unremarkable.  Intervertebral disc desiccation is observed at all levels in the cervical spine with loss of intervertebral disc height most notable at C4-5 and C5-6. Degenerative endplate disease is present at C4-5 and C5-6.  3 mm anterolisthesis at C3-4 with 2 mm retrolisthesis at C4-5. 3.5 mm anterolisthesis at C7-T1. The  No significant abnormal spinal cord signal is observed. Additional findings at individual levels are as follows:  C2-3:  Borderline left foraminal stenosis due to facet spurring.  C3-4: Mild to moderate bilateral foraminal stenosis and borderline central narrowing of the thecal sac due to disc bulge, central disc protrusion, and especially facet spurring.  C4-5: Moderate bilateral foraminal stenosis and moderate central narrowing of the thecal sac due to intervertebral spurring, right paracentral disc protrusion, and facet and uncinate spurring.  C5-6: Moderate bilateral foraminal stenosis and moderate central narrowing of the thecal sac due to disc osteophyte complex, uncinate spurring, and facet spurring.  C6-7: Borderline central narrowing of the  thecal sac due to diffuse disc bulge. Bilateral facet arthropathy.  C7-T1: No impingement. Disc uncovering and bilateral facet arthropathy.  IMPRESSION: 1. Cervical spondylosis and degenerative disc disease, resulting in moderate impingement at C4-5 and C5-6 ; and mild to moderate impingement at C3-4. 2. Chronic sphenoid sinusitis.   Electronically Signed   By: Van Clines M.D.   On: 11/04/2014 14:09     Results for orders placed during the hospital encounter of 04/26/17  CT Cervical Spine Wo Contrast   Narrative CLINICAL DATA:  Pt reports, he lost balance and  fell in the bathroom. Pt has laceration to posterior head. Pt on warfarin.  EXAM: CT HEAD WITHOUT CONTRAST  CT CERVICAL SPINE WITHOUT CONTRAST  TECHNIQUE: Multidetector CT imaging of the head and cervical spine was performed following the standard protocol without intravenous contrast. Multiplanar CT image reconstructions of the cervical spine were also generated.  COMPARISON:  11/12/2009  FINDINGS: CT HEAD FINDINGS  Brain: No evidence of acute infarction, hemorrhage, hydrocephalus, extra-axial collection or mass lesion/mass effect.  There is ventricular and sulcal enlargement reflecting mild generalized atrophy. Patchy white matter hypoattenuation is noted consistent with mild chronic microvascular ischemic change. There is no old infarct extending from the left external capsule to the left centrum semiovale.  Vascular: No hyperdense vessel or unexpected calcification.  Skull: Normal. Negative for fracture or focal lesion.  Sinuses/Orbits: Globes orbits are unremarkable and mastoid air cells. Visualized sinuses are clear.  Other: None.  CT CERVICAL SPINE FINDINGS  Alignment: Mild reversal the normal cervical lordosis, apex at C4. Grade 1 anterolisthesis of C7 on T1, degenerative in origin. No other spondylolisthesis.  Skull base and vertebrae: No acute fracture. No primary bone lesion or focal pathologic process.  Soft tissues and spinal canal: No prevertebral fluid or swelling. No visible canal hematoma.  Disc levels: Marked loss of disc height at C4-C5 and C5-C6. Moderate loss disc height at C6-C7 and C7-T1. Facet degenerative changes are noted most evident on the left at C2-C3 and C3-C4. Spondylotic disc bulging is noted most evident at C4-C5 and C5-C6. No convincing disc herniation.  Upper chest: No acute findings.  No masses or adenopathy.  Other: None.  IMPRESSION: HEAD CT  1. No acute intracranial abnormalities. 2. Atrophy, old left-sided infarct  and chronic microvascular ischemic change.  CERVICAL CT  1. No fracture or acute finding.   Electronically Signed   By: Lajean Manes M.D.   On: 04/26/2017 20:39     Cervical DG complete:  Results for orders placed during the hospital encounter of 05/29/14  DG Cervical Spine Complete   Narrative CLINICAL DATA:  Pain with radiation into left arm. Initial evaluation.  EXAM: CERVICAL SPINE  4+ VIEWS  COMPARISON:  None.  FINDINGS: Soft tissue structures are unremarkable. Diffuse severe degenerative change noted of the cervical spine with 3 mm anterolisthesis C3-C4. Anterolisthesis is most likely degenerative. No evidence of fracture or dislocation. Multilevel bilateral mild to moderate neural foraminal narrowing, particularly on the left. Ligamentous calcification.  IMPRESSION: 1. No acute abnormality. 2. Diffuse severe degenerative changes cervical spine with degenerative 3 mm anterolisthesis of C3 on C4.   Electronically Signed   By: Marcello Moores  Register   On: 05/30/2014 16:45     Shoulder-R MR wo contrast:  Results for orders placed during the hospital encounter of 06/21/16  MR SHOULDER RIGHT WO CONTRAST   Narrative CLINICAL DATA:  Right shoulder pain since a fall on 05/16/2016.  EXAM: MRI OF THE RIGHT SHOULDER WITHOUT  CONTRAST  TECHNIQUE: Multiplanar, multisequence MR imaging of the shoulder was performed. No intravenous contrast was administered.  COMPARISON:  Radiographs dated 05/26/2016  FINDINGS: Rotator cuff: There is a 5 x 4 cm full-thickness retracted tear involving the infraspinatus and supraspinous tendons. There is also a partial-thickness articular surface tear of the distal subscapularis tendon with dislocation of the long head of the biceps tendon into the joint secondary to that tear. Teres minor is intact.  Muscles: Moderate atrophy of the infraspinous, supraspinous, and subscapularis muscles.  Biceps long head: Long head of the biceps  tendon is dislocated into the joint secondary to tears of the subscapularis tendon and superior glenohumeral ligament.  Acromioclavicular Joint: Moderate AC joint arthropathy and hypertrophy. Type 3 acromion.  Glenohumeral Joint: Prominent glenohumeral joint effusion. The humeral head is superiorly subluxed with respect to the glenoid.  Labrum:  Intact.  Bones:  No acute bone abnormalities.  Other: None  IMPRESSION: 1. Large full-thickness retracted tears of the infraspinatus and supraspinous tendons. 2. Partial-thickness articular surface tear of the distal subscapularis tendon. 3. Medial dislocation of the long head of the biceps tendon into the glenohumeral joint. 4. Moderate AC joint arthropathy with a type 3 acromion which could predispose to impingement.   Electronically Signed   By: Lorriane Shire M.D.   On: 06/21/2016 13:37    Shoulder-L MR wo contrast:  Results for orders placed during the hospital encounter of 12/17/14  MR Shoulder Left Wo Contrast   Narrative CLINICAL DATA:  Left shoulder pain and weakness with painful range of motion.  EXAM: MRI OF THE LEFT SHOULDER WITHOUT CONTRAST  TECHNIQUE: Multiplanar, multisequence MR imaging of the shoulder was performed. No intravenous contrast was administered.  COMPARISON:  Radiographs dated 12/06/2014  FINDINGS: Rotator cuff: Tiny superficial rim rent tear of the articular surface of the distal supraspinatus tendon seen on image 11 of series 4. The rest of the rotator cuff is intact.  Muscles:  Normal.  Biceps long head:  Properly located and intact.  Acromioclavicular Joint: Slight type 3 acromion. Hypertrophy of the distal clavicle indents the supraspinatus muscle in could predispose to impingement. There is subacromial/subdeltoid bursitis.  Glenohumeral Joint: Diffuse thinning of the articular cartilage. No effusion.  Labrum:  Normal.  Bones:  Focal degenerative changes of the greater  tuberosity.  IMPRESSION: 1. Subacromial/subdeltoid bursitis. Hypertrophy of the [facility] joint could predispose to impingement. 2. Tiny focal superficial rim rent tear of the articular surface of the distal supraspinatus tendon.   Electronically Signed   By: Lorriane Shire M.D.   On: 12/17/2014 16:45     Shoulder-R DG:  Results for orders placed during the hospital encounter of 05/26/16  DG Shoulder Right   Narrative CLINICAL DATA:  Right shoulder injury.  EXAM: RIGHT SHOULDER - 2+ VIEW  COMPARISON:  No recent prior .  FINDINGS: Acromioclavicular and glenohumeral degenerative change. Mild subacromial spurring. No acute bony abnormality identified. No evidence of fracture dislocation .  IMPRESSION: Degenerative changes right acromioclavicular glenohumeral joints. Subacromial spurring. No acute abnormality .   Electronically Signed   By: Marcello Moores  Register   On: 05/26/2016 11:59    Shoulder-L DG:  Results for orders placed during the hospital encounter of 12/06/14  DG Shoulder Left   Narrative CLINICAL DATA:  Left shoulder pain for 15 years. Pain with internal rotation.  EXAM: LEFT SHOULDER - 2+ VIEW  COMPARISON:  None.  FINDINGS: No fracture or bone lesion. AC joint and glenohumeral joints are normally aligned. There is  spurring in subchondral bony irregularity at the [facility] joint consistent with mild to moderate osteoarthritis. There is mild narrowing of the subacromial space. A chronic rotator cuff tendon tear is suggested.  Bones are demineralized.  Soft tissues are unremarkable.  IMPRESSION: 1. No fracture or dislocation. 2. Mild to moderate AC joint osteoarthritis. 3. Possible chronic rotator cuff tendon tear. Consider followup shoulder MRI.   Electronically Signed   By: Lajean Manes M.D.   On: 12/06/2014 16:08       Lumbosacral Imaging: Lumbar MR wo contrast:  Results for orders placed during the hospital encounter of 06/09/17  MR LUMBAR SPINE  WO CONTRAST   Narrative CLINICAL DATA:  Low back pain, bilateral leg pain  EXAM: MRI LUMBAR SPINE WITHOUT CONTRAST  TECHNIQUE: Multiplanar, multisequence MR imaging of the lumbar spine was performed. No intravenous contrast was administered.  COMPARISON:  01/21/2016  FINDINGS: Segmentation:  Standard.  Alignment:  Physiologic.  Vertebrae:  No fracture, evidence of discitis, or bone lesion.  Conus medullaris and cauda equina: Conus extends to the L1 level. Conus and cauda equina appear normal.  Paraspinal and other soft tissues: No paraspinal abnormality.  Disc levels:  Disc spaces: Degenerative disease with disc height loss at L1-2, L2-3, and L4-5.  T12-L1: No significant disc bulge. No evidence of neural foraminal stenosis. No central canal stenosis.  L1-L2: Broad-based disc bulge. Severe bilateral facet arthropathy. Mild-moderate spinal stenosis. Mild left foraminal stenosis. No right foraminal stenosis. No central canal stenosis.  L2-L3: Broad-based disc bulge. Severe bilateral facet arthropathy. Severe spinal stenosis. No evidence of neural foraminal stenosis.  L3-L4: Broad-based disc bulge with a right paracentral disc protrusion. Severe bilateral facet arthropathy. Severe spinal stenosis. Moderate bilateral foraminal stenosis.  L4-L5: Broad-based disc bulge. Moderate bilateral facet arthropathy. Severe right lateral recess stenosis. Moderate spinal stenosis. Moderate bilateral foraminal stenosis, left greater than right.  L5-S1: Mild broad-based disc bulge. Mild bilateral facet arthropathy. No evidence of neural foraminal stenosis. No central canal stenosis.  IMPRESSION: 1. At L2-3 there is a broad-based disc bulge. Severe bilateral facet arthropathy and severe spinal stenosis. 2. At L3-4 there is a broad-based disc bulge with a right paracentral disc protrusion. Severe bilateral facet arthropathy. Severe spinal stenosis and moderate bilateral foraminal  stenosis. 3. At L4-5 there is a broad-based disc bulge. Moderate bilateral facet arthropathy. Severe right lateral recess stenosis. Moderate spinal stenosis. Moderate bilateral foraminal stenosis, left greater than right.   Electronically Signed   By: Kathreen Devoid   On: 06/09/2017 14:33    Lumbar DG 2-3 views:  Results for orders placed during the hospital encounter of 10/05/17  DG Lumbar Spine 2-3 Views   Narrative CLINICAL DATA:  Multilevel lumbar laminectomy  EXAM: DG C-ARM 61-120 MIN; LUMBAR SPINE - 2-3 VIEW  COMPARISON:  06/09/2017  FLUOROSCOPY TIME:  Fluoroscopy Time:  10.5 seconds  Radiation Exposure Index (if provided by the fluoroscopic device): 7.44 mGy  Number of Acquired Spot Images: 6  FINDINGS: Initial images demonstrate a surgical instrument posterior to the L4-5 disc space. Disc space narrowing is noted similar to that seen on prior MRI. Subsequent instruments are noted posterior to the L3-4 level. Surgical retractors are also noted at the L3 level.  IMPRESSION: Intraoperative localization for wide laminectomy.   Electronically Signed   By: Inez Catalina M.D.   On: 10/05/2017 11:25     Results for orders placed in visit on 09/17/13  DG Foot Complete Right   Narrative 3 views of the right foot  demonstrates an osseously mature right foot mild  Taylor's bunion deformity with soft tissue increase in density overlying  the fifth metatarsal head otherwise elongated first metatarsal with early  osteophytic changes.    Complexity Note: Imaging results reviewed. Results shared with Mr. Garrette, using Layman's terms.                         ROS  Cardiovascular: Abnormal heart rhythm, Weak heart (CHF) and Blood thinners:  Anticoagulant Pulmonary or Respiratory: No reported pulmonary signs or symptoms such as wheezing and difficulty taking a deep full breath (Asthma), difficulty blowing air out (Emphysema), coughing up mucus (Bronchitis), persistent dry  cough, or temporary stoppage of breathing during sleep Neurological: Stroke (Residual deficits or weakness: not noted) Review of Past Neurological Studies:  Results for orders placed or performed during the hospital encounter of 11/19/17  MR Brain Wo Contrast (neuro protocol)   Narrative   CLINICAL DATA:  Persistent central vertigo.  EXAM: MRI HEAD WITHOUT CONTRAST  TECHNIQUE: Multiplanar, multiecho pulse sequences of the brain and surrounding structures were obtained without intravenous contrast.  COMPARISON:  Head CT 11/19/2017  Brain MRI 11/04/2014  FINDINGS: BRAIN: There is no acute infarct, acute hemorrhage or mass effect. The midline structures are normal. Old left external capsule infarct. Early confluent hyperintense T2-weighted signal of the periventricular and deep white matter, most commonly due to chronic ischemic microangiopathy. Generalized atrophy without lobar predilection. Susceptibility-sensitive sequences show no chronic microhemorrhage or superficial siderosis.  VASCULAR: Major intracranial arterial and venous sinus flow voids are preserved.  SKULL AND UPPER CERVICAL SPINE: The visualized skull base, calvarium, upper cervical spine and extracranial soft tissues are normal.  SINUSES/ORBITS: No fluid levels or advanced mucosal thickening. No mastoid or middle ear effusion. The orbits are normal.  IMPRESSION: 1. No acute intracranial abnormality. 2. Old left external capsule small vessel infarct and sequelae of chronic microvascular ischemia. 3. Generalized atrophy.   Electronically Signed   By: Ulyses Jarred M.D.   On: 11/19/2017 01:54   CT Head Wo Contrast   Narrative   CLINICAL DATA:  Acute onset of vertigo, nausea and vomiting. Hypotension.  EXAM: CT HEAD WITHOUT CONTRAST  TECHNIQUE: Contiguous axial images were obtained from the base of the skull through the vertex without intravenous contrast.  COMPARISON:  CT of the head performed  04/26/2017, and MRI of the brain performed 11/12/2009  FINDINGS: Brain: No evidence of acute infarction, hemorrhage, hydrocephalus, extra-axial collection or mass lesion / mass effect.  Prominence of the ventricles and sulci reflects moderate cortical volume loss. Cerebellar atrophy is noted. Scattered periventricular and subcortical white matter change likely reflects small vessel ischemic microangiopathy. A chronic infarct is noted at the left external capsule.  The brainstem and fourth ventricle are within normal limits. The cerebral hemispheres demonstrate grossly normal gray-white differentiation. No mass effect or midline shift is seen.  Vascular: No hyperdense vessel or unexpected calcification.  Skull: There is no evidence of fracture; visualized osseous structures are unremarkable in appearance.  Sinuses/Orbits: The visualized portions of the orbits are within normal limits. The paranasal sinuses and mastoid air cells are well-aerated.  Other: No significant soft tissue abnormalities are seen.  IMPRESSION: 1. No acute intracranial pathology seen on CT. 2. Moderate cortical volume loss and scattered small vessel ischemic microangiopathy. 3. 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   Electronically Signed   By: Garald Balding M.D.   On: 11/19/2017 01:06   Results for orders  placed or performed during the hospital encounter of 11/12/09  MR Angiogram Head Wo Contrast   Narrative   Clinical Data:  Stroke.  Right-sided weakness and aphasia.  Status post t-PA.   MRI HEAD WITHOUT CONTRAST MRA HEAD WITHOUT CONTRAST   Technique:  Multiplanar, multiecho pulse sequences of the brain and surrounding structures were obtained without intravenous contrast. Angiographic images of the head were obtained using MRA technique without contrast.   Comparison:  CT 11/12/2009   MRI HEAD   Findings:  Acute infarction in the posterior external capsule left. There are  several small areas of acute infarction in the left posterior insula.  There is acute infarct in the left parietal periventricular white matter.   Negative for acute hemorrhage.  Tiny areas of chronic ischemia in the white matter bilaterally.  The brainstem is normal.  There is no mass lesion.  Ventricle size is normal.   Mild mucosal edema in the paranasal sinuses.   IMPRESSION: Acute infarct in the left insula and left basal ganglia and left parietal periventricular white matter in the left middle cerebral artery distribution.   Negative for hemorrhage.   MRA HEAD   Findings: Both vertebral arteries are patent to the basilar.  The basilar and  AICA are patent.  Superior cerebellar artery is patent bilaterally.  Posterior cerebral arteries are  patent bilaterally. There is a moderately severe focal stenosis in the left posterior cerebral artery.  Right posterior cerebral artery is patent.   The internal carotid artery is patent bilaterally without stenosis. The anterior and middle cerebral arteries are patent bilaterally without stenosis or occlusion.   Negative for aneurysm.   IMPRESSION: Moderately severe focal stenosis in the mid-left posterior cerebral artery.   No evidence of middle cerebral artery stenosis to account for the patient's left sided infarct.  Provider: Richarda Overlie   Psychological-Psychiatric: Depressed Gastrointestinal: Reflux or heatburn Genitourinary: Passing kidney stones Hematological: Bleeding easily Endocrine: No reported endocrine signs or symptoms such as high or low blood sugar, rapid heart rate due to high thyroid levels, obesity or weight gain due to slow thyroid or thyroid disease Rheumatologic: No reported rheumatological signs and symptoms such as fatigue, joint pain, tenderness, swelling, redness, heat, stiffness, decreased range of motion, with or without associated rash Musculoskeletal: Negative for myasthenia gravis, muscular  dystrophy, multiple sclerosis or malignant hyperthermia Work History: Retired  Allergies  Mr. Meyers is allergic to baclofen.  Laboratory Chemistry  Inflammation Markers (CRP: Acute Phase) (ESR: Chronic Phase) Lab Results  Component Value Date   ESRSEDRATE 13 11/19/2015   LATICACIDVEN 1.5 11/19/2017                         Rheumatology Markers No results found for: RF, ANA, LABURIC, URICUR, LYMEIGGIGMAB, LYMEABIGMQN, HLAB27                      Renal Function Markers Lab Results  Component Value Date   BUN 29 (H) 11/22/2017   CREATININE 1.56 (H) 11/22/2017   GFRAA 45 (L) 11/22/2017   GFRNONAA 39 (L) 11/22/2017                             Hepatic Function Markers Lab Results  Component Value Date   AST 29 11/19/2017   ALT 21 11/19/2017   ALBUMIN 3.6 11/19/2017   ALKPHOS 69 11/19/2017   LIPASE 46 11/19/2017   AMMONIA 12  11/20/2017                        Electrolytes Lab Results  Component Value Date   NA 139 11/22/2017   K 4.3 11/22/2017   CL 109 11/22/2017   CALCIUM 8.8 (L) 11/22/2017   MG 2.2 11/15/2009   PHOS 2.6 09/26/2017                        Neuropathy Markers Lab Results  Component Value Date   VITAMINB12 >1500 pg/mL (H) 02/20/2009   FOLATE 7.7 02/20/2009   HGBA1C  11/12/2009    5.3 (NOTE)                                                                       According to the ADA Clinical Practice Recommendations for 2011, when HbA1c is used as a screening test:   >=6.5%   Diagnostic of Diabetes Mellitus           (if abnormal result  is confirmed)  5.7-6.4%   Increased risk of developing Diabetes Mellitus  References:Diagnosis and Classification of Diabetes Mellitus,Diabetes EMLJ,4492,01(EOFHQ 1):S62-S69 and Standards of Medical Care in         Diabetes - 2011,Diabetes Care,2011,34  (Suppl 1):S11-S61.                        CNS Tests No results found for: COLORCSF, APPEARCSF, RBCCOUNTCSF, WBCCSF, POLYSCSF, LYMPHSCSF, EOSCSF, PROTEINCSF, GLUCCSF,  JCVIRUS, CSFOLI, IGGCSF                      Bone Pathology Markers No results found for: VD25OH, [id number], [id number], [id number], 25OHVITD1, 25OHVITD2, 25OHVITD3, TESTOFREE, TESTOSTERONE                       Coagulation Parameters Lab Results  Component Value Date   INR 2.80 11/22/2017   LABPROT 29.3 (H) 11/22/2017   APTT 32 10/05/2017   PLT 135 (L) 11/22/2017                        Cardiovascular Markers Lab Results  Component Value Date   CKTOTAL 40 11/12/2009   CKMB 1.0 11/12/2009   TROPONINI <0.03 11/19/2017   HGB 13.1 11/22/2017   HCT 37.7 (L) 11/22/2017                         CA Markers No results found for: CEA, CA125, LABCA2                      Note: Lab results reviewed.  [place]  Drug: Mr. Godshall  reports no history of drug use. Alcohol:  reports no history of alcohol use. Tobacco:  reports that he has never smoked. He has never used smokeless tobacco. Medical:  has a past medical history of Anxiety, Arrhythmia, Atrial fibrillation ([place]), Basal cell cancer (2008), Bipolar disorder ([place]), Cardiomyopathy ([place]), CHF (congestive heart failure) ([place]), CVA (cerebral vascular accident) ([place]) (8/11), Depression, Dyslipidemia, Generalized anxiety disorder, GERD (gastroesophageal reflux disease), History of kidney stones, Hypothyroidism, Macular degeneration, Melanoma ([place]), Osteoarthritis,  Tremor, and Valvular heart disease. Family: family history includes Cancer in his brother; Coronary artery disease in his brother; Heart attack in his father; Hypertension in his brother; Stroke in his father.  Past Surgical History:  Procedure Laterality Date  . BACK SURGERY  1991   L4-5  . CATARACT EXTRACTION W/ INTRAOCULAR LENS IMPLANT Bilateral   . EYE SURGERY    . JOINT REPLACEMENT    . KNEE ARTHROPLASTY Left 12/03/2015   Procedure: COMPUTER ASSISTED TOTAL KNEE ARTHROPLASTY;  Surgeon: Dereck Leep, MD;  Location: [facility] ORS;  Service: Orthopedics;  Laterality: Left;  . LUMBAR  LAMINECTOMY/DECOMPRESSION MICRODISCECTOMY N/A 10/05/2017   Procedure: LUMBAR LAMINECTOMY/DECOMPRESSION MICRODISCECTOMY 3 LEVELS L2-5, CORPECTOMY;  Surgeon: Meade Maw, MD;  Location: [facility] ORS;  Service: Neurosurgery;  Laterality: N/A;  . SKIN CANCER EXCISION     melenoma on scalp   Active Ambulatory Problems    Diagnosis Date Noted  . Bipolar disorder ([place]) 02/20/2009  . ATRIAL FLUTTER 11/28/2009  . Hyperlipidemia 06/01/2010  . Long term (current) use of anticoagulants 07/22/2010  . Medication adverse effect 07/28/2010  . Prostate cancer screening 07/28/2010  . Hearing decreased 12/16/2010  . Encounter for Medicare annual wellness exam 08/16/2012  . Macular degeneration 10/16/2012  . Bunion, right foot 08/17/2013  . Atrial fibrillation, chronic 01/16/2014  . Chronic systolic heart failure ([place]) 01/16/2014  . Cardiomyopathy, dilated ([place]) 09/16/2014  . Cerebral vascular accident ([place]) 09/16/2014  . Heart valve disease 09/16/2014  . Left shoulder pain 12/06/2014  . MI (mitral incompetence) 08/08/2014  . TI (tricuspid incompetence) 08/08/2014  . Spinal stenosis in cervical region 02/05/2015  . Cervical spinal stenosis 02/02/2015  . Routine general medical examination at a health care facility 08/08/2015  . S/P total knee arthroplasty 12/03/2015  . Family history of prostate cancer 01/07/2016  . Hypothyroidism 01/07/2016  . Mass in the abdomen 02/09/2016  . BPH associated with nocturia 02/17/2016  . Disequilibrium 01/29/2016  . Unsteadiness 01/29/2016  . Pain in joint, shoulder region 04/02/2016  . Right shoulder injury 05/26/2016  . Depression 11/12/2016  . H/O falling 05/02/2017  . Elevated serum creatinine 05/02/2017  . Preoperative examination 09/26/2017  . Neurogenic claudication 10/05/2017  . Confusion 11/19/2017   Resolved Ambulatory Problems    Diagnosis Date Noted  . Atrial fibrillation ([place]) 11/28/2009  . CVA 11/28/2009  . SHOULDER PAIN 05/27/2010  .  Cerumen impaction 12/07/2010  . Neck pain 02/19/2011  . Rash and nonspecific skin eruption 01/20/2012  . Dyslipidemia 09/16/2014  . AF (paroxysmal atrial fibrillation) ([place]) 02/15/2014  . Breathlessness on exertion 12/19/2013  . Chest pain 08/08/2014  . Chronic atrial fibrillation 01/16/2014  . Cerebrovascular accident (CVA) ([place]) 03/18/2015  . Suicidal ideation 09/02/2016  . Hypotension 10/29/2016  . Hyperkalemia 05/02/2017  . Closed head injury 05/02/2017   Past Medical History:  Diagnosis Date  . Anxiety   . Arrhythmia   . Basal cell cancer 2008  . Cardiomyopathy ([place])   . CHF (congestive heart failure) ([place])   . CVA (cerebral vascular accident) ([place]) 8/11  . Generalized anxiety disorder   . GERD (gastroesophageal reflux disease)   . History of kidney stones   . Melanoma ([place])   . Osteoarthritis   . Tremor   . Valvular heart disease    Constitutional Exam  General appearance: Well nourished, well developed, and well hydrated. In no apparent acute distress Vitals:   05/11/18 1353  BP: 105/66  Pulse: 84  Resp: 16  Temp: 97.6 F (36.4 C)  TempSrc: Oral  SpO2: 99%  Weight: 155 lb (70.3 kg)  Height: _0  (1.676 m)   BMI Assessment: Estimated body mass index is 25.02 kg/m as calculated from the following:   Height as of this encounter: _1  (1.676 m).   Weight as of this encounter: 155 lb (70.3 kg).  BMI interpretation table: BMI level Category Range association with higher incidence of chronic pain  <18 kg/m2 Underweight   18.5-24.9 kg/m2 Ideal body weight   25-29.9 kg/m2 Overweight Increased incidence by 20%  30-34.9 kg/m2 Obese (Class I) Increased incidence by 68%  35-39.9 kg/m2 Severe obesity (Class II) Increased incidence by 136%  >40 kg/m2 Extreme obesity (Class III) Increased incidence by 254%   Patient's current BMI Ideal Body weight  Body mass index is 25.02 kg/m. Ideal body weight: 63.8 kg (140 lb 10.5 oz) Adjusted ideal body weight: 66.4 kg (146  lb 6.3 oz)   BMI Readings from Last 4 Encounters:  05/11/18 25.02 kg/m  12/06/17 24.69 kg/m  11/22/17 25.76 kg/m  10/06/17 27.19 kg/m   Wt Readings from Last 4 Encounters:  05/11/18 155 lb (70.3 kg)  12/06/17 153 lb (69.4 kg)  11/22/17 159 lb 9.6 oz (72.4 kg)  10/06/17 168 lb 6.9 oz (76.4 kg)  Psych/Mental status: Alert, oriented x 3 (person, place, & time)       Eyes: PERLA Respiratory: No evidence of acute respiratory distress  Cervical Spine Area Exam  Skin & Axial Inspection: No masses, redness, edema, swelling, or associated skin lesions Alignment: Symmetrical Functional ROM: Decreased ROM      Stability: No instability detected Muscle Tone/Strength: Functionally intact. No obvious neuro-muscular anomalies detected. Sensory (Neurological): Musculoskeletal pain pattern Palpation: No palpable anomalies              Upper Extremity (UE) Exam    Side: Right upper extremity  Side: Left upper extremity  Skin & Extremity Inspection: Skin color, temperature, and hair growth are WNL. No peripheral edema or cyanosis. No masses, redness, swelling, asymmetry, or associated skin lesions. No contractures.  Skin & Extremity Inspection: Skin color, temperature, and hair growth are WNL. No peripheral edema or cyanosis. No masses, redness, swelling, asymmetry, or associated skin lesions. No contractures.  Functional ROM: Unrestricted ROM          Functional ROM: Unrestricted ROM          Muscle Tone/Strength: Functionally intact. No obvious neuro-muscular anomalies detected.  Muscle Tone/Strength: Functionally intact. No obvious neuro-muscular anomalies detected.  Sensory (Neurological): Unimpaired          Sensory (Neurological): Unimpaired          Palpation: No palpable anomalies              Palpation: No palpable anomalies              Provocative Test(s):  Phalen's test: deferred  Tinel's test: deferred Apley's scratch test (touch opposite shoulder):  Action 1 (Across chest):  Decreased ROM Action 2 (Overhead): Decreased ROM Action 3 (LB reach): Decreased ROM   Provocative Test(s):  Phalen's test: deferred Tinel's test: deferred Apley's scratch test (touch opposite shoulder):  Action 1 (Across chest): Decreased ROM Action 2 (Overhead): Decreased ROM Action 3 (LB reach): Decreased ROM    Thoracic Spine Area Exam  Skin & Axial Inspection: No masses, redness, or swelling Alignment: Symmetrical Functional ROM: Unrestricted ROM Stability: No instability detected Muscle Tone/Strength: Functionally intact. No obvious neuro-muscular anomalies detected. Sensory (Neurological): Unimpaired Muscle strength & Tone:  No palpable anomalies  Lumbar Spine Area Exam  Skin & Axial Inspection: No masses, redness, or swelling Alignment: Symmetrical Functional ROM: Decreased ROM affecting both sides Stability: No instability detected Muscle Tone/Strength: Increased muscle tone over affected area Sensory (Neurological): Musculoskeletal pain pattern and dermatomal Palpation: No palpable anomalies       Provocative Tests: Hyperextension/rotation test: (+) bilaterally for facet joint pain. Lumbar quadrant test (Kemp's test): (+) bilaterally for facet joint pain. Lateral bending test: (+) due to pain. Patrick's Maneuver: deferred today                   FABER* test: deferred today                   S-I anterior distraction/compression test: deferred today         S-I lateral compression test: deferred today         S-I Thigh-thrust test: deferred today         S-I Gaenslen's test: deferred today         *(Flexion, ABduction and External Rotation)  Gait & Posture Assessment  Ambulation: Limited Gait: Limited. Using assistive device to ambulate Posture: Tense   Lower Extremity Exam    Side: Right lower extremity  Side: Left lower extremity  Stability: No instability observed          Stability: No instability observed          Skin & Extremity Inspection: Skin color,  temperature, and hair growth are WNL. No peripheral edema or cyanosis. No masses, redness, swelling, asymmetry, or associated skin lesions. No contractures.  Skin & Extremity Inspection: Skin color, temperature, and hair growth are WNL. No peripheral edema or cyanosis. No masses, redness, swelling, asymmetry, or associated skin lesions. No contractures.  Functional ROM: Unrestricted ROM                  Functional ROM: Unrestricted ROM                  Muscle Tone/Strength: Functionally intact. No obvious neuro-muscular anomalies detected.  Muscle Tone/Strength: Functionally intact. No obvious neuro-muscular anomalies detected.  Sensory (Neurological): Dermatomal pain pattern        Sensory (Neurological): Dermatomal pain pattern        DTR: Patellar: deferred today Achilles: deferred today Plantar: deferred today  DTR: Patellar: deferred today Achilles: deferred today Plantar: deferred today  Palpation: No palpable anomalies  Palpation: No palpable anomalies   Assessment  Primary Diagnosis & Pertinent Problem List: The primary encounter diagnosis was Hx of decompressive lumbar laminectomy. Diagnoses of Post laminectomy syndrome, Chronic bilateral low back pain with bilateral sciatica, Spinal stenosis, lumbar region, with neurogenic claudication, Atrial fibrillation, chronic, Chronic systolic heart failure ([place]), Cardiomyopathy, dilated ([place]), Cerebrovascular accident (CVA), unspecified mechanism ([place]), Heart valve disease, Atrial flutter, unspecified type ([place]), and CKD (chronic kidney disease) stage 3, GFR 30-59 ml/min ([place]) were also pertinent to this visit.  Visit Diagnosis (New problems to examiner): 1. Hx of decompressive lumbar laminectomy   2. Post laminectomy syndrome   3. Chronic bilateral low back pain with bilateral sciatica   4. Spinal stenosis, lumbar region, with neurogenic claudication   5. Atrial fibrillation, chronic   6. Chronic systolic heart failure ([facility])   7.  Cardiomyopathy, dilated ([place])   8. Cerebrovascular accident (CVA), unspecified mechanism ([place])   9. Heart valve disease   10. Atrial flutter, unspecified type ([place])   11. CKD (chronic kidney disease) stage 3, GFR  30-59 ml/min ([place])    I had an extensive discussion with the patient about spinal cord stimulation.  Patient's pain is primarily axial rather than appendicular.  Furthermore patient is high risk to be off his chronic anticoagulation with Coumadin given his atrial flutter, history of prior CVA.  Given his primarily axial rather than appendicular pain as well as high risk profile to be off anticoagulation in the context of his cardiovascular disease, do not recommend spinal cord stimulation.  We will try and optimize non-opioid analgesics.  Trial of gabapentin as below.  Plan of Care (Initial workup plan)   Pharmacotherapy (current): Medications ordered:  Meds ordered this encounter  Medications  . gabapentin (NEURONTIN) 300 MG capsule    Sig: 300 mg qhs for 2-3 weeks and then increase to 300 mg BID if no side effects    Dispense:  60 capsule    Refill:  1   Medications administered during this visit: Norberto Sorenson T. Forbush had no medications administered during this visit.    Provider-requested follow-up: Return in about 8 weeks (around 07/06/2018) for Medication Management.  Future Appointments  Date Time Provider [place]  07/06/2018  1:45 PM Gillis Santa, MD [facility] None    Primary Care Physician: Tower, Wynelle Fanny, MD Location: [facility] Note by: Gillis Santa, M.D, Date: 05/11/2018; Time: 8:34 AM  There are no Patient Instructions on file for this visit.

## 2018-05-15 ENCOUNTER — Encounter: Payer: Self-pay | Admitting: Student in an Organized Health Care Education/Training Program

## 2018-05-16 DIAGNOSIS — Z7901 Long term (current) use of anticoagulants: Secondary | ICD-10-CM | POA: Diagnosis not present

## 2018-05-22 DIAGNOSIS — H35371 Puckering of macula, right eye: Secondary | ICD-10-CM | POA: Diagnosis not present

## 2018-05-22 DIAGNOSIS — H35033 Hypertensive retinopathy, bilateral: Secondary | ICD-10-CM | POA: Diagnosis not present

## 2018-05-22 DIAGNOSIS — H353113 Nonexudative age-related macular degeneration, right eye, advanced atrophic without subfoveal involvement: Secondary | ICD-10-CM | POA: Diagnosis not present

## 2018-05-22 DIAGNOSIS — H353221 Exudative age-related macular degeneration, left eye, with active choroidal neovascularization: Secondary | ICD-10-CM | POA: Diagnosis not present

## 2018-06-06 DIAGNOSIS — R0601 Orthopnea: Secondary | ICD-10-CM | POA: Diagnosis not present

## 2018-06-06 DIAGNOSIS — I5022 Chronic systolic (congestive) heart failure: Secondary | ICD-10-CM | POA: Diagnosis not present

## 2018-06-06 DIAGNOSIS — I420 Dilated cardiomyopathy: Secondary | ICD-10-CM | POA: Diagnosis not present

## 2018-06-06 DIAGNOSIS — I482 Chronic atrial fibrillation, unspecified: Secondary | ICD-10-CM | POA: Diagnosis not present

## 2018-06-06 DIAGNOSIS — I38 Endocarditis, valve unspecified: Secondary | ICD-10-CM | POA: Diagnosis not present

## 2018-06-06 DIAGNOSIS — I5023 Acute on chronic systolic (congestive) heart failure: Secondary | ICD-10-CM | POA: Diagnosis not present

## 2018-06-06 DIAGNOSIS — I10 Essential (primary) hypertension: Secondary | ICD-10-CM | POA: Diagnosis not present

## 2018-06-06 DIAGNOSIS — I509 Heart failure, unspecified: Secondary | ICD-10-CM | POA: Diagnosis not present

## 2018-06-13 DIAGNOSIS — Z08 Encounter for follow-up examination after completed treatment for malignant neoplasm: Secondary | ICD-10-CM | POA: Diagnosis not present

## 2018-06-13 DIAGNOSIS — D225 Melanocytic nevi of trunk: Secondary | ICD-10-CM | POA: Diagnosis not present

## 2018-06-13 DIAGNOSIS — D2271 Melanocytic nevi of right lower limb, including hip: Secondary | ICD-10-CM | POA: Diagnosis not present

## 2018-06-13 DIAGNOSIS — D2261 Melanocytic nevi of right upper limb, including shoulder: Secondary | ICD-10-CM | POA: Diagnosis not present

## 2018-06-13 DIAGNOSIS — D2272 Melanocytic nevi of left lower limb, including hip: Secondary | ICD-10-CM | POA: Diagnosis not present

## 2018-06-13 DIAGNOSIS — Z85828 Personal history of other malignant neoplasm of skin: Secondary | ICD-10-CM | POA: Diagnosis not present

## 2018-06-13 DIAGNOSIS — D2262 Melanocytic nevi of left upper limb, including shoulder: Secondary | ICD-10-CM | POA: Diagnosis not present

## 2018-06-13 DIAGNOSIS — X32XXXA Exposure to sunlight, initial encounter: Secondary | ICD-10-CM | POA: Diagnosis not present

## 2018-06-13 DIAGNOSIS — L821 Other seborrheic keratosis: Secondary | ICD-10-CM | POA: Diagnosis not present

## 2018-06-13 DIAGNOSIS — L570 Actinic keratosis: Secondary | ICD-10-CM | POA: Diagnosis not present

## 2018-06-21 DIAGNOSIS — Z7901 Long term (current) use of anticoagulants: Secondary | ICD-10-CM | POA: Diagnosis not present

## 2018-06-21 DIAGNOSIS — I482 Chronic atrial fibrillation, unspecified: Secondary | ICD-10-CM | POA: Diagnosis not present

## 2018-06-21 DIAGNOSIS — R600 Localized edema: Secondary | ICD-10-CM | POA: Diagnosis not present

## 2018-06-21 DIAGNOSIS — I5022 Chronic systolic (congestive) heart failure: Secondary | ICD-10-CM | POA: Diagnosis not present

## 2018-06-21 DIAGNOSIS — I071 Rheumatic tricuspid insufficiency: Secondary | ICD-10-CM | POA: Diagnosis not present

## 2018-06-29 DIAGNOSIS — R791 Abnormal coagulation profile: Secondary | ICD-10-CM | POA: Diagnosis not present

## 2018-07-06 ENCOUNTER — Encounter: Payer: PPO | Admitting: Student in an Organized Health Care Education/Training Program

## 2018-07-10 DIAGNOSIS — R791 Abnormal coagulation profile: Secondary | ICD-10-CM | POA: Diagnosis not present

## 2018-07-17 DIAGNOSIS — Z90710 Acquired absence of both cervix and uterus: Secondary | ICD-10-CM | POA: Diagnosis not present

## 2018-07-17 DIAGNOSIS — L03115 Cellulitis of right lower limb: Secondary | ICD-10-CM | POA: Diagnosis not present

## 2018-07-17 DIAGNOSIS — E782 Mixed hyperlipidemia: Secondary | ICD-10-CM | POA: Diagnosis not present

## 2018-07-17 DIAGNOSIS — I130 Hypertensive heart and chronic kidney disease with heart failure and stage 1 through stage 4 chronic kidney disease, or unspecified chronic kidney disease: Secondary | ICD-10-CM | POA: Diagnosis not present

## 2018-07-17 DIAGNOSIS — J9601 Acute respiratory failure with hypoxia: Secondary | ICD-10-CM | POA: Diagnosis not present

## 2018-07-17 DIAGNOSIS — T501X5A Adverse effect of loop [high-ceiling] diuretics, initial encounter: Secondary | ICD-10-CM | POA: Diagnosis not present

## 2018-07-17 DIAGNOSIS — E876 Hypokalemia: Secondary | ICD-10-CM | POA: Diagnosis not present

## 2018-07-17 DIAGNOSIS — E538 Deficiency of other specified B group vitamins: Secondary | ICD-10-CM | POA: Diagnosis not present

## 2018-07-17 DIAGNOSIS — Z881 Allergy status to other antibiotic agents status: Secondary | ICD-10-CM | POA: Diagnosis not present

## 2018-07-17 DIAGNOSIS — N183 Chronic kidney disease, stage 3 (moderate): Secondary | ICD-10-CM | POA: Diagnosis not present

## 2018-07-17 DIAGNOSIS — E559 Vitamin D deficiency, unspecified: Secondary | ICD-10-CM | POA: Diagnosis not present

## 2018-07-17 DIAGNOSIS — Z66 Do not resuscitate: Secondary | ICD-10-CM | POA: Diagnosis not present

## 2018-07-17 DIAGNOSIS — N179 Acute kidney failure, unspecified: Secondary | ICD-10-CM | POA: Diagnosis not present

## 2018-07-17 DIAGNOSIS — L89306 Pressure-induced deep tissue damage of unspecified buttock: Secondary | ICD-10-CM | POA: Diagnosis not present

## 2018-07-17 DIAGNOSIS — Z7982 Long term (current) use of aspirin: Secondary | ICD-10-CM | POA: Diagnosis not present

## 2018-07-17 DIAGNOSIS — H9190 Unspecified hearing loss, unspecified ear: Secondary | ICD-10-CM | POA: Diagnosis not present

## 2018-07-17 DIAGNOSIS — Z20828 Contact with and (suspected) exposure to other viral communicable diseases: Secondary | ICD-10-CM | POA: Diagnosis not present

## 2018-07-17 DIAGNOSIS — F0390 Unspecified dementia without behavioral disturbance: Secondary | ICD-10-CM | POA: Diagnosis not present

## 2018-07-17 DIAGNOSIS — Z79899 Other long term (current) drug therapy: Secondary | ICD-10-CM | POA: Diagnosis not present

## 2018-07-17 DIAGNOSIS — J690 Pneumonitis due to inhalation of food and vomit: Secondary | ICD-10-CM | POA: Diagnosis not present

## 2018-07-17 DIAGNOSIS — H353113 Nonexudative age-related macular degeneration, right eye, advanced atrophic without subfoveal involvement: Secondary | ICD-10-CM | POA: Diagnosis not present

## 2018-07-17 DIAGNOSIS — I482 Chronic atrial fibrillation, unspecified: Secondary | ICD-10-CM | POA: Diagnosis not present

## 2018-07-17 DIAGNOSIS — Z882 Allergy status to sulfonamides status: Secondary | ICD-10-CM | POA: Diagnosis not present

## 2018-07-17 DIAGNOSIS — D631 Anemia in chronic kidney disease: Secondary | ICD-10-CM | POA: Diagnosis not present

## 2018-07-17 DIAGNOSIS — R030 Elevated blood-pressure reading, without diagnosis of hypertension: Secondary | ICD-10-CM | POA: Diagnosis not present

## 2018-07-17 DIAGNOSIS — I5033 Acute on chronic diastolic (congestive) heart failure: Secondary | ICD-10-CM | POA: Diagnosis not present

## 2018-07-17 DIAGNOSIS — H353221 Exudative age-related macular degeneration, left eye, with active choroidal neovascularization: Secondary | ICD-10-CM | POA: Diagnosis not present

## 2018-08-02 ENCOUNTER — Telehealth: Payer: Self-pay | Admitting: Family Medicine

## 2018-08-02 DIAGNOSIS — F319 Bipolar disorder, unspecified: Secondary | ICD-10-CM

## 2018-08-02 NOTE — Telephone Encounter (Signed)
Patient is needing a new referral sent to  [facility])  Patient's wife stated that because he has not been to this Doctor in 6 months they would need a new referral.

## 2018-08-03 ENCOUNTER — Other Ambulatory Visit: Payer: Self-pay

## 2018-08-03 NOTE — Telephone Encounter (Signed)
I called [facility] to try to schedule the patient an appointment. He only saw Dr Gretel Acre once in 2018 and then cancelled both appointments that he had for followup. They will not see him back due to two cancellations. Patients wife says David Duncan is taking 10MG  of Lexapro daily and she is asking if you would please refill his medicine for him so he can stay on it.

## 2018-08-03 NOTE — Telephone Encounter (Signed)
Please refill for a month and schedule a virtual visit to discuss further  Thanks

## 2018-08-07 ENCOUNTER — Ambulatory Visit (INDEPENDENT_AMBULATORY_CARE_PROVIDER_SITE_OTHER): Payer: PPO | Admitting: Family Medicine

## 2018-08-07 ENCOUNTER — Encounter: Payer: Self-pay | Admitting: Family Medicine

## 2018-08-07 ENCOUNTER — Other Ambulatory Visit: Payer: Self-pay

## 2018-08-07 DIAGNOSIS — F329 Major depressive disorder, single episode, unspecified: Secondary | ICD-10-CM | POA: Diagnosis not present

## 2018-08-07 DIAGNOSIS — R12 Heartburn: Secondary | ICD-10-CM | POA: Diagnosis not present

## 2018-08-07 DIAGNOSIS — F319 Bipolar disorder, unspecified: Secondary | ICD-10-CM | POA: Diagnosis not present

## 2018-08-07 MED ORDER — ESCITALOPRAM OXALATE 10 MG PO TABS: 10.0000 mg | ORAL_TABLET | Freq: Every day | ORAL | 5 refills | Status: DC

## 2018-08-07 NOTE — Addendum Note (Signed)
Addended by: Tammi Sou on: 08/07/2018 02:52 PM   Modules accepted: Orders

## 2018-08-07 NOTE — Assessment & Plan Note (Addendum)
H/o this since his 60s  Dr Gretel Acre will no longer see him since he cancelled several appointments (has also had heart problems and back surgery)  Stays depressed (at least since 2014) but not suicidal  Is fatigued and un motivated Reviewed stressors/ coping techniques/symptoms/ support sources/ tx options and side effects in detail today  Benefits from low dose lexapro which does not throw him into mania  Has been on several meds in the past incl lithium and lamictal  He has a contact for another doctor to contact to get established (can also plead with previous office to let him back)  I will px lexapro for now until he can est with another psychiatrist  Wife will watch him closely for worse depression or SI as well  >25 minutes spent in face to face time with patient, >50% spent in counselling or coordination of care -including discussion of options for finding new psychiatrist and what to do if condition worsens

## 2018-08-07 NOTE — Assessment & Plan Note (Signed)
In the setting of bipolar (see A/P)

## 2018-08-07 NOTE — Progress Notes (Signed)
Virtual Visit via Video Note  I connected with David Duncan on 08/07/18 at  4:00 PM EDT by a video enabled telemedicine application and verified that I am speaking with the correct person using two identifiers.  the patient was at home today  I am in my office at [place]  The patient was able to enable audio but not video from his phone- so we conducted the visit by phone  I discussed the limitations of evaluation and management by telemedicine and the availability of in person appointments. The patient expressed understanding and agreed to proceed.  History of Present Illness: Patient presents for bipolar dz with h/o depression  He formerly saw a psychiatrist (Dr Gretel Acre) who will no longer see him since he cancelled several appointments  Last visit was 6 or more months ago   He has most recently taken lexapro 10 mg   He is home during the pandemic with wife   Bipolar dz diagnosed in 11s  It did run in family  Per wife - manic to dep - slow cycling   It landed him in the hospital -around year 2000  No suicide attempts in the past   Right now he feels pretty good - stays on the depressed side  Wife thinks depressed since aug of 2014   Has been on several medicines in the past  Lithium wellbutrin  Maybe lamictal - it may have helped (it gave him GI upset)  depakote   He has a lot of heartburn  At least every other day   Significant medical problems leave him low on energy  Heart problems  Back pain (had surgery) - has a hard time just getting from room to room  Review of Systems  Constitutional: Positive for malaise/fatigue. Negative for chills, diaphoresis, fever and weight loss.  Respiratory: Negative for cough and shortness of breath.   Cardiovascular: Negative for chest pain and palpitations.       Poor exercise tolerance  Gastrointestinal: Positive for heartburn. Negative for abdominal pain, blood in stool, diarrhea, nausea and vomiting.  Musculoskeletal:  Positive for back pain. Negative for falls and myalgias.  Skin: Negative for rash.  Neurological: Negative for dizziness, sensory change, speech change, focal weakness and headaches.  Psychiatric/Behavioral: Positive for depression. Negative for hallucinations, memory loss, substance abuse and suicidal ideas. The patient is nervous/anxious.     Patient Active Problem List   Diagnosis Date Noted  . Heartburn 08/07/2018  . Confusion 11/19/2017  . Neurogenic claudication 10/05/2017  . Preoperative examination 09/26/2017  . H/O falling 05/02/2017  . Elevated serum creatinine 05/02/2017  . Depression 11/12/2016  . Right shoulder injury 05/26/2016  . Pain in joint, shoulder region 04/02/2016  . BPH associated with nocturia 02/17/2016  . Mass in the abdomen 02/09/2016  . Disequilibrium 01/29/2016  . Unsteadiness 01/29/2016  . Family history of prostate cancer 01/07/2016  . Hypothyroidism 01/07/2016  . S/P total knee arthroplasty 12/03/2015  . Routine general medical examination at a health care facility 08/08/2015  . Spinal stenosis in cervical region 02/05/2015  . Cervical spinal stenosis 02/02/2015  . Left shoulder pain 12/06/2014  . Cardiomyopathy, dilated ([place]) 09/16/2014  . Cerebral vascular accident ([place]) 09/16/2014  . Heart valve disease 09/16/2014  . MI (mitral incompetence) 08/08/2014  . TI (tricuspid incompetence) 08/08/2014  . Atrial fibrillation, chronic 01/16/2014  . Chronic systolic heart failure ([place]) 01/16/2014  . Bunion, right foot 08/17/2013  . Macular degeneration 10/16/2012  . Encounter for Medicare  annual wellness exam 08/16/2012  . Hearing decreased 12/16/2010  . Medication adverse effect 07/28/2010  . Prostate cancer screening 07/28/2010  . Long term (current) use of anticoagulants 07/22/2010  . Hyperlipidemia 06/01/2010  . ATRIAL FLUTTER 11/28/2009  . Bipolar disorder ([place]) 02/20/2009   Past Medical History:  Diagnosis Date  . Anxiety   . Arrhythmia    . Atrial fibrillation ([place])   . Basal cell cancer 2008   on shoulder  . Bipolar disorder [facility])    hospitalized in past  . Cardiomyopathy ([place])   . CHF (congestive heart failure) ([place])   . CVA (cerebral vascular accident) ([place]) 8/11  . Depression   . Dyslipidemia   . Generalized anxiety disorder   . GERD (gastroesophageal reflux disease)   . History of kidney stones    30 years ago  . Hypothyroidism   . Macular degeneration   . Melanoma ([place])    resected from scalp approximately 1 year ago.   . Osteoarthritis    in neck and left knee  . Tremor    noted when writing  . Valvular heart disease    Past Surgical History:  Procedure Laterality Date  . BACK SURGERY  1991   L4-5  . CATARACT EXTRACTION W/ INTRAOCULAR LENS IMPLANT Bilateral   . EYE SURGERY    . JOINT REPLACEMENT    . KNEE ARTHROPLASTY Left 12/03/2015   Procedure: COMPUTER ASSISTED TOTAL KNEE ARTHROPLASTY;  Surgeon: Dereck Leep, MD;  Location: [facility] ORS;  Service: Orthopedics;  Laterality: Left;  . LUMBAR LAMINECTOMY/DECOMPRESSION MICRODISCECTOMY N/A 10/05/2017   Procedure: LUMBAR LAMINECTOMY/DECOMPRESSION MICRODISCECTOMY 3 LEVELS L2-5, CORPECTOMY;  Surgeon: Meade Maw, MD;  Location: [facility] ORS;  Service: Neurosurgery;  Laterality: N/A;  . SKIN CANCER EXCISION     melenoma on scalp   Social History   Tobacco Use  . Smoking status: Never Smoker  . Smokeless tobacco: Never Used  Substance Use Topics  . Alcohol use: No    Alcohol/week: 0.0 standard drinks  . Drug use: No   Family History  Problem Relation Age of Onset  . Stroke Father   . Heart attack Father   . Cancer Brother        prostate  . Coronary artery disease Brother   . Hypertension Brother    Allergies  Allergen Reactions  . Baclofen     Confusion/delerium   Current Outpatient Medications on File Prior to Visit  Medication Sig Dispense Refill  . Acetaminophen 500 MG coapsule Take 2 capsules by mouth every 6 (six) hours as needed  for pain.     . Amino Acids (FREE FORM AMINO ACID COMPLEX PO) Take 1 capsule by mouth 2 (two) times daily.     [name] Kitchen Apoaequorin (PREVAGEN) 10 MG CAPS Take by mouth daily.    . Ascorbic Acid (VITAMIN C) 500 MG tablet Take 500 mg by mouth 2 (two) times daily.     . B Complex Vitamins (B COMPLEX PO) Take 1 tablet by mouth 2 (two) times daily.     . Calcium-Magnesium-Vitamin D (CALCIUM 500 PO) Take 2 tablets by mouth daily.    . Cyanocobalamin (VITAMIN B-12) 1000 MCG SUBL Place 1 tablet under the tongue daily.     . furosemide (LASIX) 20 MG tablet Take 20 mg by mouth daily as needed for fluid.     [name] Kitchen loperamide (IMODIUM) 2 MG capsule Take 2 mg by mouth as needed for diarrhea or loose stools.    [name] Kitchen MELATONIN PO Take  2 tablets by mouth at bedtime. Restful Sleep by [facility] : Hops Extract 4:1 (Humulus lupulus) (flower) Chamomile (Matricaria chamomilla) (flower) Passion Flower Extract (standardized to 4% vitexin) (Passiflora incarnate) (flower) 5 HTP (5-Hydroxtryptophan) (Griffonia simplicifolia) (seed)    . Multiple Vitamin (MULTIVITAMIN) capsule Take 1 capsule by mouth daily.    . polyethylene glycol (MIRALAX / GLYCOLAX) packet Take 17 g by mouth daily. 14 each 0  . sacubitril-valsartan (ENTRESTO) 24-26 MG Take 1 tablet by mouth 2 (two) times daily. 60 tablet 3  . Specialty Vitamins Products (ICAPS LUTEIN-ZEAXANTHIN PO) Take 2 capsules by mouth daily.    [name] Kitchen Specialty Vitamins Products (PROSTATE PO) Take 1 tablet by mouth daily.     [name] Kitchen warfarin (COUMADIN) 3 MG tablet Take 1 tablet (3 mg total) by mouth daily. (Patient taking differently: Take 2 mg by mouth daily. ) 30 tablet 2  . metoprolol succinate (TOPROL-XL) 100 MG 24 hr tablet Take 1 tablet (100 mg total) by mouth daily. Take with or immediately following a meal. 90 tablet 3   No current facility-administered medications on file prior to visit.     Observations/Objective: The patient sounds like his normal self  His wife helps with some of the history   He is mentally sharp and answers questions appropriately  Not distressed or tearful , but affect is slightly flat  Not hoarse or sob No cough  He voices chronic back pain with limited mobility as well as fatigue  Not hallucinating or delusional   Assessment and Plan: Problem List Items Addressed This Visit      Other   Depression (Chronic)    In the setting of bipolar (see A/P)      Relevant Medications   escitalopram (LEXAPRO) 10 MG tablet   Bipolar disorder ([place]) - Primary    H/o this since his 15s  Dr Gretel Acre will no longer see him since he cancelled several appointments (has also had heart problems and back surgery)  Stays depressed (at least since 2014) but not suicidal  Is fatigued and un motivated Reviewed stressors/ coping techniques/symptoms/ support sources/ tx options and side effects in detail today  Benefits from low dose lexapro which does not throw him into mania  Has been on several meds in the past incl lithium and lamictal  He has a contact for another doctor to contact to get established (can also plead with previous office to let him back)  I will px lexapro for now until he can est with another psychiatrist  Wife will watch him closely for worse depression or SI as well  >25 minutes spent in face to face time with patient, >50% spent in counselling or coordination of care -including discussion of options for finding new psychiatrist and what to do if condition worsens       Heartburn    As often as every other day No nsaids  No abd pain  Disc diet and avoidance of eating before lying down  Avoid acidic food/bev Recommended trial of pepcid 10 -20 mg otc and update           Follow Up Instructions: Continue the lexapro at 10 mg daily (I will send in that strength)  Alert me if problems or side effects  Also if you begin to feel manic  I feel you do need to re establish with a psychiatrist since your bipolar disease is not well controlled (you  continue to be depressed) Go ahead and contact the new doctor you had  in mind and /or plead case with your previous psychiatrist  If depression worsens or you feel you could harm yourself- let us know and go to the ER if necessary     I discussed the assessment and treatment plan with the patient. The patient was provided an opportunity to ask questions and all were answered. The patient agreed with the plan and demonstrated an understanding of the instructions.   The patient was advised to call back or seek an in-person evaluation if the symptoms worsen or if the condition fails to improve as anticipated.   Loura Pardon, MD

## 2018-08-07 NOTE — Assessment & Plan Note (Signed)
As often as every other day No nsaids  No abd pain  Disc diet and avoidance of eating before lying down  Avoid acidic food/bev Recommended trial of pepcid 10 -20 mg otc and update

## 2018-08-07 NOTE — Telephone Encounter (Signed)
Pt wanted to go ahead and get appt on the books so appt scheduled today, med not filled since he is seeing Dr. Glori Bickers today

## 2018-08-10 DIAGNOSIS — Z7901 Long term (current) use of anticoagulants: Secondary | ICD-10-CM | POA: Diagnosis not present

## 2018-08-11 ENCOUNTER — Telehealth: Payer: Self-pay

## 2018-08-11 NOTE — Telephone Encounter (Signed)
Called David Duncan back and she wanted you to know Mr Pezzullo has a New Psychiatry appt with Dr Cephus Shelling for July 2020 with the hopes of maybe being moved up sooner. She thanks you for refilling his lexapro medicine.

## 2018-08-11 NOTE — Telephone Encounter (Signed)
Copied from [place] [id number]. Topic: Quick Communication - See Telephone Encounter >> Aug 10, 2018  4:14 PM L[place] wrote: CRM for notification. See Telephone encounter for: 08/10/18. Pt called to talk to [facility] about a referral that she had given. Pt would not divulge further information. Please return call at [phone] [facility])

## 2018-08-11 NOTE — Telephone Encounter (Signed)
That is excellent. Thanks

## 2018-09-14 DIAGNOSIS — Z7901 Long term (current) use of anticoagulants: Secondary | ICD-10-CM | POA: Diagnosis not present

## 2018-09-25 DIAGNOSIS — H353221 Exudative age-related macular degeneration, left eye, with active choroidal neovascularization: Secondary | ICD-10-CM | POA: Diagnosis not present

## 2018-10-16 DIAGNOSIS — Z7901 Long term (current) use of anticoagulants: Secondary | ICD-10-CM | POA: Diagnosis not present

## 2018-10-31 DIAGNOSIS — I5022 Chronic systolic (congestive) heart failure: Secondary | ICD-10-CM | POA: Diagnosis not present

## 2018-10-31 DIAGNOSIS — I482 Chronic atrial fibrillation, unspecified: Secondary | ICD-10-CM | POA: Diagnosis not present

## 2018-10-31 DIAGNOSIS — I10 Essential (primary) hypertension: Secondary | ICD-10-CM | POA: Diagnosis not present

## 2018-11-02 DIAGNOSIS — R809 Proteinuria, unspecified: Secondary | ICD-10-CM | POA: Diagnosis not present

## 2018-11-02 DIAGNOSIS — N179 Acute kidney failure, unspecified: Secondary | ICD-10-CM | POA: Diagnosis not present

## 2018-11-02 DIAGNOSIS — N183 Chronic kidney disease, stage 3 (moderate): Secondary | ICD-10-CM | POA: Diagnosis not present

## 2018-11-02 DIAGNOSIS — I129 Hypertensive chronic kidney disease with stage 1 through stage 4 chronic kidney disease, or unspecified chronic kidney disease: Secondary | ICD-10-CM | POA: Diagnosis not present

## 2018-11-08 DIAGNOSIS — I129 Hypertensive chronic kidney disease with stage 1 through stage 4 chronic kidney disease, or unspecified chronic kidney disease: Secondary | ICD-10-CM | POA: Diagnosis not present

## 2018-11-08 DIAGNOSIS — R809 Proteinuria, unspecified: Secondary | ICD-10-CM | POA: Diagnosis not present

## 2018-11-08 DIAGNOSIS — N183 Chronic kidney disease, stage 3 (moderate): Secondary | ICD-10-CM | POA: Diagnosis not present

## 2018-11-21 DIAGNOSIS — Z7901 Long term (current) use of anticoagulants: Secondary | ICD-10-CM | POA: Diagnosis not present

## 2018-11-29 DIAGNOSIS — F3132 Bipolar disorder, current episode depressed, moderate: Secondary | ICD-10-CM | POA: Diagnosis not present

## 2018-12-11 DIAGNOSIS — H43813 Vitreous degeneration, bilateral: Secondary | ICD-10-CM | POA: Diagnosis not present

## 2018-12-11 DIAGNOSIS — H35371 Puckering of macula, right eye: Secondary | ICD-10-CM | POA: Diagnosis not present

## 2018-12-11 DIAGNOSIS — H353113 Nonexudative age-related macular degeneration, right eye, advanced atrophic without subfoveal involvement: Secondary | ICD-10-CM | POA: Diagnosis not present

## 2018-12-11 DIAGNOSIS — H353221 Exudative age-related macular degeneration, left eye, with active choroidal neovascularization: Secondary | ICD-10-CM | POA: Diagnosis not present

## 2018-12-12 ENCOUNTER — Telehealth: Payer: Self-pay | Admitting: *Deleted

## 2018-12-12 NOTE — Telephone Encounter (Signed)
Patient not a candidate for any injections. Patient is high risk to be off his chronic anticoagulation with Coumadin given his atrial flutter, history of prior CVA. This was discussed at his January visit. Please call and inform patient. IF they want to discuss further, please create virtual visit.

## 2018-12-13 NOTE — Telephone Encounter (Signed)
Called patient's wife, David Duncan to let her know that per Dr Holley Raring, the patient is not a candidate for LESI d/t his history.  I did let her know that if she would like to further discuss other options that we would be happy to schedule a virtual visit.  She will call if they desire this.

## 2018-12-21 DIAGNOSIS — F3132 Bipolar disorder, current episode depressed, moderate: Secondary | ICD-10-CM | POA: Diagnosis not present

## 2018-12-28 DIAGNOSIS — Z7901 Long term (current) use of anticoagulants: Secondary | ICD-10-CM | POA: Diagnosis not present

## 2019-01-17 DIAGNOSIS — F3132 Bipolar disorder, current episode depressed, moderate: Secondary | ICD-10-CM | POA: Diagnosis not present

## 2019-01-22 DIAGNOSIS — Z7901 Long term (current) use of anticoagulants: Secondary | ICD-10-CM | POA: Diagnosis not present

## 2019-02-13 DIAGNOSIS — F3132 Bipolar disorder, current episode depressed, moderate: Secondary | ICD-10-CM | POA: Diagnosis not present

## 2019-02-22 DIAGNOSIS — Z7901 Long term (current) use of anticoagulants: Secondary | ICD-10-CM | POA: Diagnosis not present

## 2019-03-05 DIAGNOSIS — F3132 Bipolar disorder, current episode depressed, moderate: Secondary | ICD-10-CM | POA: Diagnosis not present

## 2019-03-05 DIAGNOSIS — L821 Other seborrheic keratosis: Secondary | ICD-10-CM | POA: Diagnosis not present

## 2019-03-05 DIAGNOSIS — X32XXXA Exposure to sunlight, initial encounter: Secondary | ICD-10-CM | POA: Diagnosis not present

## 2019-03-05 DIAGNOSIS — Z85828 Personal history of other malignant neoplasm of skin: Secondary | ICD-10-CM | POA: Diagnosis not present

## 2019-03-05 DIAGNOSIS — D2262 Melanocytic nevi of left upper limb, including shoulder: Secondary | ICD-10-CM | POA: Diagnosis not present

## 2019-03-05 DIAGNOSIS — C44311 Basal cell carcinoma of skin of nose: Secondary | ICD-10-CM | POA: Diagnosis not present

## 2019-03-05 DIAGNOSIS — D225 Melanocytic nevi of trunk: Secondary | ICD-10-CM | POA: Diagnosis not present

## 2019-03-05 DIAGNOSIS — D2271 Melanocytic nevi of right lower limb, including hip: Secondary | ICD-10-CM | POA: Diagnosis not present

## 2019-03-05 DIAGNOSIS — L570 Actinic keratosis: Secondary | ICD-10-CM | POA: Diagnosis not present

## 2019-03-05 DIAGNOSIS — D485 Neoplasm of uncertain behavior of skin: Secondary | ICD-10-CM | POA: Diagnosis not present

## 2019-03-15 DIAGNOSIS — H353113 Nonexudative age-related macular degeneration, right eye, advanced atrophic without subfoveal involvement: Secondary | ICD-10-CM | POA: Diagnosis not present

## 2019-03-15 DIAGNOSIS — H353221 Exudative age-related macular degeneration, left eye, with active choroidal neovascularization: Secondary | ICD-10-CM | POA: Diagnosis not present

## 2019-03-15 DIAGNOSIS — H43813 Vitreous degeneration, bilateral: Secondary | ICD-10-CM | POA: Diagnosis not present

## 2019-03-15 DIAGNOSIS — H35371 Puckering of macula, right eye: Secondary | ICD-10-CM | POA: Diagnosis not present

## 2019-03-27 DIAGNOSIS — Z7901 Long term (current) use of anticoagulants: Secondary | ICD-10-CM | POA: Diagnosis not present

## 2019-04-11 DIAGNOSIS — C44311 Basal cell carcinoma of skin of nose: Secondary | ICD-10-CM | POA: Diagnosis not present

## 2019-04-23 DIAGNOSIS — D2239 Melanocytic nevi of other parts of face: Secondary | ICD-10-CM | POA: Diagnosis not present

## 2019-04-23 DIAGNOSIS — L570 Actinic keratosis: Secondary | ICD-10-CM | POA: Diagnosis not present

## 2019-04-23 DIAGNOSIS — X32XXXA Exposure to sunlight, initial encounter: Secondary | ICD-10-CM | POA: Diagnosis not present

## 2019-04-25 DIAGNOSIS — Z7901 Long term (current) use of anticoagulants: Secondary | ICD-10-CM | POA: Diagnosis not present

## 2019-04-27 DIAGNOSIS — Z9842 Cataract extraction status, left eye: Secondary | ICD-10-CM | POA: Diagnosis not present

## 2019-04-27 DIAGNOSIS — Z9841 Cataract extraction status, right eye: Secondary | ICD-10-CM | POA: Diagnosis not present

## 2019-04-27 DIAGNOSIS — H353221 Exudative age-related macular degeneration, left eye, with active choroidal neovascularization: Secondary | ICD-10-CM | POA: Diagnosis not present

## 2019-04-27 DIAGNOSIS — H52223 Regular astigmatism, bilateral: Secondary | ICD-10-CM | POA: Diagnosis not present

## 2019-04-27 DIAGNOSIS — H353114 Nonexudative age-related macular degeneration, right eye, advanced atrophic with subfoveal involvement: Secondary | ICD-10-CM | POA: Diagnosis not present

## 2019-05-02 DIAGNOSIS — N179 Acute kidney failure, unspecified: Secondary | ICD-10-CM | POA: Diagnosis not present

## 2019-05-02 DIAGNOSIS — N183 Chronic kidney disease, stage 3 unspecified: Secondary | ICD-10-CM | POA: Diagnosis not present

## 2019-05-02 DIAGNOSIS — R809 Proteinuria, unspecified: Secondary | ICD-10-CM | POA: Diagnosis not present

## 2019-05-03 DIAGNOSIS — N183 Chronic kidney disease, stage 3 unspecified: Secondary | ICD-10-CM | POA: Diagnosis not present

## 2019-05-03 DIAGNOSIS — R809 Proteinuria, unspecified: Secondary | ICD-10-CM | POA: Diagnosis not present

## 2019-05-03 DIAGNOSIS — N179 Acute kidney failure, unspecified: Secondary | ICD-10-CM | POA: Diagnosis not present

## 2019-05-07 DIAGNOSIS — I10 Essential (primary) hypertension: Secondary | ICD-10-CM | POA: Diagnosis not present

## 2019-05-07 DIAGNOSIS — I340 Nonrheumatic mitral (valve) insufficiency: Secondary | ICD-10-CM | POA: Diagnosis not present

## 2019-05-07 DIAGNOSIS — I482 Chronic atrial fibrillation, unspecified: Secondary | ICD-10-CM | POA: Diagnosis not present

## 2019-05-07 DIAGNOSIS — I5022 Chronic systolic (congestive) heart failure: Secondary | ICD-10-CM | POA: Diagnosis not present

## 2019-05-08 DIAGNOSIS — F3132 Bipolar disorder, current episode depressed, moderate: Secondary | ICD-10-CM | POA: Diagnosis not present

## 2019-05-09 DIAGNOSIS — N183 Chronic kidney disease, stage 3 unspecified: Secondary | ICD-10-CM | POA: Diagnosis not present

## 2019-05-09 DIAGNOSIS — I10 Essential (primary) hypertension: Secondary | ICD-10-CM | POA: Diagnosis not present

## 2019-05-30 DIAGNOSIS — F3132 Bipolar disorder, current episode depressed, moderate: Secondary | ICD-10-CM | POA: Diagnosis not present

## 2019-06-03 ENCOUNTER — Ambulatory Visit: Payer: PPO | Attending: Internal Medicine

## 2019-06-03 DIAGNOSIS — Z23 Encounter for immunization: Secondary | ICD-10-CM | POA: Insufficient documentation

## 2019-06-03 NOTE — Progress Notes (Signed)
   Covid-19 Vaccination Clinic  Name:  OLAWALE STONEBURNER    MRN: [id number] DOB: 11-26-1930  06/03/2019  Mr. Mckethan was observed post Covid-19 immunization for 15 minutes without incidence. He was provided with Vaccine Information Sheet and instruction to access the V-Safe system.   Mr. Basset was instructed to call 911 with any severe reactions post vaccine: [name] Kitchen Difficulty breathing  . Swelling of your face and throat  . A fast heartbeat  . A bad rash all over your body  . Dizziness and weakness    Immunizations Administered    Name Date Dose VIS Date Route   Pfizer COVID-19 Vaccine 06/03/2019  1:12 PM 0.3 mL 03/23/2019 Intramuscular   Manufacturer: [place]   Lot: [id number]   [place]: [id number]

## 2019-06-06 ENCOUNTER — Telehealth: Payer: Self-pay | Admitting: Family Medicine

## 2019-06-06 NOTE — Chronic Care Management (AMB) (Signed)
  Chronic Care Management   Note  06/06/2019 Name: ZYLER HYSON MRN: [id number] DOB: [birth year]  KREIG PARSON is a 86 y.o. year old male who is a primary care patient of Tower, Wynelle Fanny, MD. I reached out to [name] by phone today in response to a referral sent by Mr. Townes Fuhs Becherer's PCP, Tower, Wynelle Fanny, MD.   Mr. Sahr was given information about Chronic Care Management services today including:  1. CCM service includes personalized support from designated clinical staff supervised by his physician, including individualized plan of care and coordination with other care providers 2. 24/7 contact phone numbers for assistance for urgent and routine care needs. 3. Service will only be billed when office clinical staff spend 20 minutes or more in a month to coordinate care. 4. Only one practitioner may furnish and bill the service in a calendar month. 5. The patient may stop CCM services at any time (effective at the end of the month) by phone call to the office staff. 6. The patient will be responsible for cost sharing (co-pay) of up to 20% of the service fee (after annual deductible is met).  Patient agreed to services and verbal consent obtained.   Follow up plan:   Raynicia Dukes [employer] Scheduler

## 2019-06-13 ENCOUNTER — Telehealth: Payer: Self-pay

## 2019-06-13 DIAGNOSIS — I482 Chronic atrial fibrillation, unspecified: Secondary | ICD-10-CM

## 2019-06-13 DIAGNOSIS — I420 Dilated cardiomyopathy: Secondary | ICD-10-CM

## 2019-06-13 NOTE — Telephone Encounter (Signed)
I would like to request a referral for David Duncan to chronic care management pharmacy services for the following conditions:   Chronic systolic heart failure [id number]  Atrial fibrillation, chronic [I48.20]   Debbora Dus, PharmD Clinical Pharmacist [facility] at [facility] [phone]

## 2019-06-14 NOTE — Telephone Encounter (Signed)
Referral done

## 2019-06-19 NOTE — Chronic Care Management (AMB) (Signed)
Chronic Care Management Pharmacy  Name: David Duncan  MRN: [id number] DOB: [birth year]  Chief Complaint/ HPI  David Duncan,  86 y.o., male presents for their Initial CCM visit with the clinical pharmacist via telephone.  PCP : Abner Greenspan, MD  Their chronic conditions include: hypertension (per cardio) AFIB, CKD, CHF, depression, bipolar disorder, hypothyroidism  Patient concerns: denies medication concerns   Office Visits:  08/07/18: [facility] - rx for escitalopram, pt to f/u with psychiatry, trial Pepcid OTC for heart burn --> takes Tums for PRN heartburn   Consult Visit:  05/09/19: Nephrology - CKD stage 3, secondary HTN and CHF, continue Entresto, rtc 1 week for labs, some orthostatic symptoms, f/u with cardiology  05/07/19: Cardiology - continue current medications, rtc 6 months   Allergies  Allergen Reactions  . Baclofen     Confusion/delerium   Medications: Outpatient Encounter Medications as of 06/20/2019  Medication Sig  . Acetaminophen 500 MG coapsule Take 2 capsules by mouth every 6 (six) hours as needed for pain.   . Amino Acids (FREE FORM AMINO ACID COMPLEX PO) Take 1 capsule by mouth 2 (two) times daily.   [name] Kitchen Apoaequorin (PREVAGEN) 10 MG CAPS Take by mouth daily.  . Ascorbic Acid (VITAMIN C) 500 MG tablet Take 500 mg by mouth 2 (two) times daily.   . B Complex Vitamins (B COMPLEX PO) Take 1 tablet by mouth 2 (two) times daily.   . Calcium-Magnesium-Vitamin D (CALCIUM 500 PO) Take 2 tablets by mouth daily.  . Cyanocobalamin (VITAMIN B-12) 1000 MCG SUBL Place 1 tablet under the tongue daily.   [name] Kitchen escitalopram (LEXAPRO) 10 MG tablet Take 1 tablet (10 mg total) by mouth daily.  . furosemide (LASIX) 20 MG tablet Take 20 mg by mouth daily as needed for fluid.   [name] Kitchen loperamide (IMODIUM) 2 MG capsule Take 2 mg by mouth as needed for diarrhea or loose stools.  [name] Kitchen MELATONIN PO Take 2 tablets by mouth at bedtime. Restful Sleep by [facility] : Hops Extract 4:1 (Humulus  lupulus) (flower) Chamomile (Matricaria chamomilla) (flower) Passion Flower Extract (standardized to 4% vitexin) (Passiflora incarnate) (flower) 5 HTP (5-Hydroxtryptophan) (Griffonia simplicifolia) (seed)  . metoprolol succinate (TOPROL-XL) 100 MG 24 hr tablet Take 1 tablet (100 mg total) by mouth daily. Take with or immediately following a meal.  . Multiple Vitamin (MULTIVITAMIN) capsule Take 1 capsule by mouth daily.  . polyethylene glycol (MIRALAX / GLYCOLAX) packet Take 17 g by mouth daily.  . sacubitril-valsartan (ENTRESTO) 24-26 MG Take 1 tablet by mouth 2 (two) times daily.  [name] Kitchen Specialty Vitamins Products (ICAPS LUTEIN-ZEAXANTHIN PO) Take 2 capsules by mouth daily.  [name] Kitchen Specialty Vitamins Products (PROSTATE PO) Take 1 tablet by mouth daily.   [name] Kitchen warfarin (COUMADIN) 3 MG tablet Take 1 tablet (3 mg total) by mouth daily. (Patient taking differently: Take 2 mg by mouth daily. )   No facility-administered encounter medications on file as of 06/20/2019.   Current Diagnosis/Assessment: Goals    . Increase water intake     Starting 12/31/2015, I will attempt to drink at least 6-8 glasses of water daily.     [name] Kitchen Pharmacy Care Plan     Current Barriers:  . Chronic Disease Management support, education, and care coordination needs related to hypertension, AFIB, CKD, CHF, depression, bipolar disorder, hypothyroidism  Pharmacist Clinical Goal(s):  . Reduce cardiovascular risk by initiating cholesterol medication, such as atorvastatin daily.  . Remain up to date on vaccinations. Recommend updating your tetanus vaccine (Tdap)  and shingles vaccines (Shingrix) as well as the pneumonia vaccine (Pneumovax). . Review medication safety every 6 months.   Interventions: . Comprehensive medication review performed.  Patient Self Care Activities:  . Self administers medications as prescribed  Initial goal documentation      Hyperlipidemia   Lipid Panel     Component Value Date/Time   CHOL 171  02/06/2016 1157   TRIG 95.0 02/06/2016 1157   HDL 46.40 02/06/2016 1157   CHOLHDL 4 02/06/2016 1157   VLDL 19.0 02/06/2016 1157   LDLCALC 106 (H) 02/06/2016 1157    CAD/CVA 6 years ago; LDL goal < 70 Patient has failed these meds in past: denies  Patient is currently uncontrolled on the following medications:   No pharmacotherapy  We discussed: history of statin, pt denies history   Plan: Consider starting moderate intensity statin.  AFIB   CBC Latest Ref Rng & Units 11/22/2017 11/19/2017 09/26/2017  WBC 3.8 - 10.6 K/uL 8.7 8.4 6.1  Hemoglobin 13.0 - 18.0 g/dL 13.1 13.9 14.6  Hematocrit 40.0 - 52.0 % 37.7(L) 40.6 42.5  Platelets 150 - 440 K/uL 135(L) 143(L) 138.0(L)   Patient is currently rate controlled. Patient is currently controlled on the following medications:  Metoprolol succinate 100 mg - 1 tablet daily with meal  Warfarin 2 mg - 1 tablet daily  We discussed: [facility] hospital monitors INR, usually checked about every 6-8 weeks   Plan: Continue current medications  Heart Failure   CMP Latest Ref Rng & Units 11/22/2017 11/21/2017 11/20/2017  Glucose 70 - 99 mg/dL 101(H) 95 89  BUN 8 - 23 mg/dL 29(H) 26(H) 32(H)  Creatinine 0.61 - 1.24 mg/dL 1.56(H) 1.58(H) 1.82(H)  Sodium 135 - 145 mmol/L 139 138 140  Potassium 3.5 - 5.1 mmol/L 4.3 4.0 4.3  Chloride 98 - 111 mmol/L 109 106 107  CO2 22 - 32 mmol/L 24 23 24   Calcium 8.9 - 10.3 mg/dL 8.8(L) 8.9 9.1  Total Protein 6.5 - 8.1 g/dL - - -  Total Bilirubin 0.3 - 1.2 mg/dL - - -  Alkaline Phos 38 - 126 U/L - - -  AST 15 - 41 U/L - - -  ALT 0 - 44 U/L - - -   Type: Systolic Last ejection fraction: 2019 30% NYHA Class: III (marked limitation of activity) AHA HF Stage: C (Heart disease and symptoms present)  Patient has failed these meds in past: unknown Patient is currently controlled on the following medications:   Furosemide 20 mg - 1 tablet daily   Metoprolol succinate 100 mg - 1 tablet daily with  meal  Entresto 24-26 mg - 1 tablet BID  Plan: Continue current medications  Hypothyroidism  TSH: 11/19/17 1.565 Patient has failed these meds in past: unknown Patient is currently controlled on the following medications:   No pharmacotherapy  Plan: Continue management without pharmacotherapy and annual monitoring of TFTs  Depression/Bipolar Disorder  Followed by Dr. Nicolasa Ducking Patient has failed these meds in past: sertraline, lithium, Wellbutrin, Lamictal, Depakote, escitalopram  Patient is currently controlled on the following medications:   Effexor 37.5 mg - once daily   We discussed: started just about a couple months ago, has noticed much of a difference  Plan: Continue current medications   Medication Management  OTCs: Acetaminophen 650 mg - 1 arthritis strength daily, Prevagen 10 mg, vitamin C 500 mg, B-complex, Calcium-Magnesium, B12 1000 mcg SL, melatonin PO, MiraLAX, Immodium 2 mg, prostate supplement, methyl-folate, Reversitrol, vitamin D3, tumeric, Maxi-vision, Lysine, Liquid amino-acids,  [place] (herb for longevity/vitality), De Nurse, [place]on[place]  Pharmacy/Benefits: [facility]   Adherence: takes out of bottles, no pillbox    Affordability: no concerns   Vaccines: Recommend Shingrix, PPSV23, Tdap, annual influenza (declines flu)  CCM Follow Up: November 09, 2019 at 8:30 AM  Debbora Dus, PharmD Clinical Pharmacist [facility] at [facility] [phone]

## 2019-06-20 ENCOUNTER — Other Ambulatory Visit: Payer: Self-pay

## 2019-06-20 ENCOUNTER — Other Ambulatory Visit: Payer: Self-pay | Admitting: Family Medicine

## 2019-06-20 ENCOUNTER — Ambulatory Visit: Payer: PPO

## 2019-06-20 DIAGNOSIS — R351 Nocturia: Secondary | ICD-10-CM

## 2019-06-20 DIAGNOSIS — R12 Heartburn: Secondary | ICD-10-CM

## 2019-06-20 DIAGNOSIS — E785 Hyperlipidemia, unspecified: Secondary | ICD-10-CM

## 2019-06-20 DIAGNOSIS — I482 Chronic atrial fibrillation, unspecified: Secondary | ICD-10-CM

## 2019-06-20 DIAGNOSIS — Z7901 Long term (current) use of anticoagulants: Secondary | ICD-10-CM | POA: Diagnosis not present

## 2019-06-20 DIAGNOSIS — I639 Cerebral infarction, unspecified: Secondary | ICD-10-CM

## 2019-06-20 DIAGNOSIS — F329 Major depressive disorder, single episode, unspecified: Secondary | ICD-10-CM

## 2019-06-20 DIAGNOSIS — E039 Hypothyroidism, unspecified: Secondary | ICD-10-CM

## 2019-06-20 DIAGNOSIS — N401 Enlarged prostate with lower urinary tract symptoms: Secondary | ICD-10-CM

## 2019-06-20 DIAGNOSIS — F319 Bipolar disorder, unspecified: Secondary | ICD-10-CM

## 2019-06-20 DIAGNOSIS — I5022 Chronic systolic (congestive) heart failure: Secondary | ICD-10-CM

## 2019-06-20 NOTE — Telephone Encounter (Signed)
-----   Message from Debbora Dus, [facility] sent at 06/20/2019 12:50 PM EST ----- Dr. Maryann Alar. Kyger is open to trying statin therapy due to history of CAD/CVA. Would you be interested in starting atorvastatin 10 mg? Previous LDL was 106 in October 2017, so we may want to repeat his lipid panel first. No drug interactions noted.Sharyn Lull

## 2019-06-20 NOTE — Patient Instructions (Signed)
June 20, 2019  Dear David Duncan,  It was a pleasure meeting you during our initial appointment on June 20, 2019. Below is a summary of the goals we discussed and components of chronic care management. Please contact me anytime with questions or concerns.   Visit Information  Goals Addressed            This Visit's Progress   . Pharmacy Care Plan       Current Barriers:  . Chronic Disease Management support, education, and care coordination needs related to hypertension, AFIB, CKD, CHF, depression, bipolar disorder, hypothyroidism  Pharmacist Clinical Goal(s):  . Reduce cardiovascular risk by initiating cholesterol medication, such as atorvastatin daily.  . Remain up to date on vaccinations. Recommend updating your tetanus vaccine (Tdap) and shingles vaccines (Shingrix) as well as the pneumonia vaccine (Pneumovax). . Review medication safety every 6 months.   Interventions: . Comprehensive medication review performed.  Patient Self Care Activities:  . Self administers medications as prescribed  Initial goal documentation       David Duncan was given information about Chronic Care Management services today including:  1. CCM service includes personalized support from designated clinical staff supervised by his physician, including individualized plan of care and coordination with other care providers 2. 24/7 contact phone numbers for assistance for urgent and routine care needs. 3. Standard insurance, coinsurance, copays and deductibles apply for chronic care management only during months in which we provide at least 20 minutes of these services. Most insurances cover these services at 100%, however patients may be responsible for any copay, coinsurance and/or deductible if applicable. This service may help you avoid the need for more expensive face-to-face services. 4. Only one practitioner may furnish and bill the service in a calendar month. 5. The patient may stop CCM  services at any time (effective at the end of the month) by phone call to the office staff.  Patient agreed to services and verbal consent obtained.   Print copy of patient instructions provided.  Telephone follow up appointment with pharmacy team member scheduled for: November 09, 2019 at 8:30 AM  Debbora Dus, PharmD Clinical Pharmacist [facility] [phone]  Zoster Vaccine, Recombinant injection What is this medicine? ZOSTER VACCINE (ZOS ter vak SEEN) is used to prevent shingles in adults 84 years old and over. This vaccine is not used to treat shingles or nerve pain from shingles. This medicine may be used for other purposes; ask your health care provider or pharmacist if you have questions. COMMON BRAND NAME(S): [facility] What should I tell my health care provider before I take this medicine? They need to know if you have any of these conditions:  blood disorders or disease  cancer like leukemia or lymphoma  immune system problems or therapy  an unusual or allergic reaction to vaccines, other medications, foods, dyes, or preservatives  pregnant or trying to get pregnant  breast-feeding How should I use this medicine? This vaccine is for injection in a muscle. It is given by a health care professional. Talk to your pediatrician regarding the use of this medicine in children. This medicine is not approved for use in children. Overdosage: If you think you have taken too much of this medicine contact a poison control center or emergency room at once. NOTE: This medicine is only for you. Do not share this medicine with others. What if I miss a dose? Keep appointments for follow-up (booster) doses as directed. It is important  not to miss your dose. Call your doctor or health care professional if you are unable to keep an appointment. What may interact with this medicine?  medicines that suppress your immune system  medicines to treat cancer  steroid  medicines like prednisone or cortisone This list may not describe all possible interactions. Give your health care provider a list of all the medicines, herbs, non-prescription drugs, or dietary supplements you use. Also tell them if you smoke, drink alcohol, or use illegal drugs. Some items may interact with your medicine. What should I watch for while using this medicine? Visit your doctor for regular check ups. This vaccine, like all vaccines, may not fully protect everyone. What side effects may I notice from receiving this medicine? Side effects that you should report to your doctor or health care professional as soon as possible:  allergic reactions like skin rash, itching or hives, swelling of the face, lips, or tongue  breathing problems Side effects that usually do not require medical attention (report these to your doctor or health care professional if they continue or are bothersome):  chills  headache  fever  nausea, vomiting  redness, warmth, pain, swelling or itching at site where injected  tiredness This list may not describe all possible side effects. Call your doctor for medical advice about side effects. You may report side effects to FDA at 1-800-FDA-1088. Where should I keep my medicine? This vaccine is only given in a clinic, pharmacy, doctor's office, or other health care setting and will not be stored at home. NOTE: This sheet is a summary. It may not cover all possible information. If you have questions about this medicine, talk to your doctor, pharmacist, or health care provider.  2020 Elsevier/Gold Standard (2016-11-08 13:20:30)

## 2019-06-20 NOTE — Telephone Encounter (Signed)
Please send this to preferred pharmacy   Schedule fasting lab in 4-6 weeks for lipids   I will cc Debbora Dus

## 2019-06-21 NOTE — Telephone Encounter (Signed)
Aware- I will route to Debbora Dus

## 2019-06-21 NOTE — Telephone Encounter (Signed)
#   on file is pt's wife's phone #, she said she didn't think that the pt would want to start a statin med and that they are not really "medicine people" and they have a OTC medication that they can use to help with his cholesterol. Wife wasn't aware that the pharmacist talked to pt about starting a statin medication. Given pt's mental status Sharyn Lull it may be a good idea to call wife and let her know why you all wanted to start the medication. For now the wife said she will hold off on [place] sending in the Lipitor and hold off on labs until she talks to pt and gets to the bottom of this, FYI to [place] and Dr. Glori Bickers

## 2019-06-26 ENCOUNTER — Ambulatory Visit: Payer: PPO | Attending: Internal Medicine

## 2019-06-26 DIAGNOSIS — Z23 Encounter for immunization: Secondary | ICD-10-CM

## 2019-06-26 NOTE — Progress Notes (Signed)
   Covid-19 Vaccination Clinic  Name:  David Duncan    MRN: [id number] DOB: [birth year]  06/26/2019  David Duncan was observed post Covid-19 immunization for 15 minutes without incident. He was provided with Vaccine Information Sheet and instruction to access the V-Safe system.   David Duncan was instructed to call 911 with any severe reactions post vaccine: [name] Kitchen Difficulty breathing  . Swelling of face and throat  . A fast heartbeat  . A bad rash all over body  . Dizziness and weakness   Immunizations Administered    Name Date Dose VIS Date Route   Pfizer COVID-19 Vaccine 06/26/2019 12:33 PM 0.3 mL 03/23/2019 Intramuscular   Manufacturer: [place]   Lot: [id number]   [place]: [id number]

## 2019-07-05 DIAGNOSIS — H35371 Puckering of macula, right eye: Secondary | ICD-10-CM | POA: Diagnosis not present

## 2019-07-05 DIAGNOSIS — H353221 Exudative age-related macular degeneration, left eye, with active choroidal neovascularization: Secondary | ICD-10-CM | POA: Diagnosis not present

## 2019-07-05 DIAGNOSIS — H35033 Hypertensive retinopathy, bilateral: Secondary | ICD-10-CM | POA: Diagnosis not present

## 2019-07-05 DIAGNOSIS — H353113 Nonexudative age-related macular degeneration, right eye, advanced atrophic without subfoveal involvement: Secondary | ICD-10-CM | POA: Diagnosis not present

## 2019-07-23 DIAGNOSIS — Z7901 Long term (current) use of anticoagulants: Secondary | ICD-10-CM | POA: Diagnosis not present

## 2019-08-13 DIAGNOSIS — L821 Other seborrheic keratosis: Secondary | ICD-10-CM | POA: Diagnosis not present

## 2019-08-13 DIAGNOSIS — D2262 Melanocytic nevi of left upper limb, including shoulder: Secondary | ICD-10-CM | POA: Diagnosis not present

## 2019-08-13 DIAGNOSIS — X32XXXA Exposure to sunlight, initial encounter: Secondary | ICD-10-CM | POA: Diagnosis not present

## 2019-08-13 DIAGNOSIS — D2261 Melanocytic nevi of right upper limb, including shoulder: Secondary | ICD-10-CM | POA: Diagnosis not present

## 2019-08-13 DIAGNOSIS — L570 Actinic keratosis: Secondary | ICD-10-CM | POA: Diagnosis not present

## 2019-08-13 DIAGNOSIS — Z85828 Personal history of other malignant neoplasm of skin: Secondary | ICD-10-CM | POA: Diagnosis not present

## 2019-08-13 DIAGNOSIS — D2271 Melanocytic nevi of right lower limb, including hip: Secondary | ICD-10-CM | POA: Diagnosis not present

## 2019-08-22 DIAGNOSIS — Z7901 Long term (current) use of anticoagulants: Secondary | ICD-10-CM | POA: Diagnosis not present

## 2019-09-14 DIAGNOSIS — F3132 Bipolar disorder, current episode depressed, moderate: Secondary | ICD-10-CM | POA: Diagnosis not present

## 2019-09-24 DIAGNOSIS — Z7901 Long term (current) use of anticoagulants: Secondary | ICD-10-CM | POA: Diagnosis not present

## 2019-10-19 DIAGNOSIS — F3132 Bipolar disorder, current episode depressed, moderate: Secondary | ICD-10-CM | POA: Diagnosis not present

## 2019-10-25 DIAGNOSIS — H43813 Vitreous degeneration, bilateral: Secondary | ICD-10-CM | POA: Diagnosis not present

## 2019-10-25 DIAGNOSIS — H353113 Nonexudative age-related macular degeneration, right eye, advanced atrophic without subfoveal involvement: Secondary | ICD-10-CM | POA: Diagnosis not present

## 2019-10-25 DIAGNOSIS — H353221 Exudative age-related macular degeneration, left eye, with active choroidal neovascularization: Secondary | ICD-10-CM | POA: Diagnosis not present

## 2019-10-25 DIAGNOSIS — H35371 Puckering of macula, right eye: Secondary | ICD-10-CM | POA: Diagnosis not present

## 2019-10-29 ENCOUNTER — Encounter (HOSPITAL_COMMUNITY): Payer: Self-pay | Admitting: Emergency Medicine

## 2019-10-29 ENCOUNTER — Inpatient Hospital Stay (HOSPITAL_COMMUNITY)
Admission: EM | Admit: 2019-10-29 | Discharge: 2019-11-01 | DRG: 194 | Disposition: A | Discharge status: Skilled Nursing Facility | Payer: PPO | Attending: Internal Medicine | Admitting: Internal Medicine

## 2019-10-29 DIAGNOSIS — Z20822 Contact with and (suspected) exposure to covid-19: Secondary | ICD-10-CM | POA: Diagnosis not present

## 2019-10-29 DIAGNOSIS — E785 Hyperlipidemia, unspecified: Secondary | ICD-10-CM | POA: Diagnosis not present

## 2019-10-29 DIAGNOSIS — H3530 Unspecified macular degeneration: Secondary | ICD-10-CM | POA: Diagnosis present

## 2019-10-29 DIAGNOSIS — I10 Essential (primary) hypertension: Secondary | ICD-10-CM | POA: Diagnosis not present

## 2019-10-29 DIAGNOSIS — E039 Hypothyroidism, unspecified: Secondary | ICD-10-CM | POA: Diagnosis present

## 2019-10-29 DIAGNOSIS — I4821 Permanent atrial fibrillation: Secondary | ICD-10-CM | POA: Diagnosis present

## 2019-10-29 DIAGNOSIS — E569 Vitamin deficiency, unspecified: Secondary | ICD-10-CM | POA: Diagnosis not present

## 2019-10-29 DIAGNOSIS — Z8042 Family history of malignant neoplasm of prostate: Secondary | ICD-10-CM | POA: Diagnosis not present

## 2019-10-29 DIAGNOSIS — K219 Gastro-esophageal reflux disease without esophagitis: Secondary | ICD-10-CM | POA: Diagnosis not present

## 2019-10-29 DIAGNOSIS — R531 Weakness: Secondary | ICD-10-CM | POA: Diagnosis not present

## 2019-10-29 DIAGNOSIS — R110 Nausea: Secondary | ICD-10-CM | POA: Diagnosis not present

## 2019-10-29 DIAGNOSIS — R000 Tachycardia, unspecified: Secondary | ICD-10-CM | POA: Diagnosis not present

## 2019-10-29 DIAGNOSIS — J189 Pneumonia, unspecified organism: Secondary | ICD-10-CM | POA: Diagnosis not present

## 2019-10-29 DIAGNOSIS — G9389 Other specified disorders of brain: Secondary | ICD-10-CM | POA: Diagnosis not present

## 2019-10-29 DIAGNOSIS — Z823 Family history of stroke: Secondary | ICD-10-CM

## 2019-10-29 DIAGNOSIS — Z66 Do not resuscitate: Secondary | ICD-10-CM | POA: Diagnosis present

## 2019-10-29 DIAGNOSIS — I480 Paroxysmal atrial fibrillation: Secondary | ICD-10-CM | POA: Diagnosis not present

## 2019-10-29 DIAGNOSIS — I4819 Other persistent atrial fibrillation: Secondary | ICD-10-CM | POA: Diagnosis not present

## 2019-10-29 DIAGNOSIS — I6389 Other cerebral infarction: Secondary | ICD-10-CM | POA: Diagnosis not present

## 2019-10-29 DIAGNOSIS — F3289 Other specified depressive episodes: Secondary | ICD-10-CM | POA: Diagnosis not present

## 2019-10-29 DIAGNOSIS — I130 Hypertensive heart and chronic kidney disease with heart failure and stage 1 through stage 4 chronic kidney disease, or unspecified chronic kidney disease: Secondary | ICD-10-CM | POA: Diagnosis present

## 2019-10-29 DIAGNOSIS — Z96652 Presence of left artificial knee joint: Secondary | ICD-10-CM | POA: Diagnosis present

## 2019-10-29 DIAGNOSIS — R42 Dizziness and giddiness: Secondary | ICD-10-CM | POA: Diagnosis not present

## 2019-10-29 DIAGNOSIS — N289 Disorder of kidney and ureter, unspecified: Secondary | ICD-10-CM

## 2019-10-29 DIAGNOSIS — I2510 Atherosclerotic heart disease of native coronary artery without angina pectoris: Secondary | ICD-10-CM | POA: Diagnosis present

## 2019-10-29 DIAGNOSIS — Z8249 Family history of ischemic heart disease and other diseases of the circulatory system: Secondary | ICD-10-CM

## 2019-10-29 DIAGNOSIS — I482 Chronic atrial fibrillation, unspecified: Secondary | ICD-10-CM | POA: Diagnosis present

## 2019-10-29 DIAGNOSIS — J320 Chronic maxillary sinusitis: Secondary | ICD-10-CM | POA: Diagnosis present

## 2019-10-29 DIAGNOSIS — R05 Cough: Secondary | ICD-10-CM | POA: Diagnosis not present

## 2019-10-29 DIAGNOSIS — J0100 Acute maxillary sinusitis, unspecified: Secondary | ICD-10-CM | POA: Diagnosis present

## 2019-10-29 DIAGNOSIS — R488 Other symbolic dysfunctions: Secondary | ICD-10-CM | POA: Diagnosis not present

## 2019-10-29 DIAGNOSIS — I495 Sick sinus syndrome: Secondary | ICD-10-CM | POA: Diagnosis not present

## 2019-10-29 DIAGNOSIS — I081 Rheumatic disorders of both mitral and tricuspid valves: Secondary | ICD-10-CM | POA: Diagnosis not present

## 2019-10-29 DIAGNOSIS — Z888 Allergy status to other drugs, medicaments and biological substances status: Secondary | ICD-10-CM

## 2019-10-29 DIAGNOSIS — I4891 Unspecified atrial fibrillation: Secondary | ICD-10-CM | POA: Diagnosis present

## 2019-10-29 DIAGNOSIS — N1832 Chronic kidney disease, stage 3b: Secondary | ICD-10-CM | POA: Diagnosis present

## 2019-10-29 DIAGNOSIS — M6281 Muscle weakness (generalized): Secondary | ICD-10-CM | POA: Diagnosis not present

## 2019-10-29 DIAGNOSIS — F319 Bipolar disorder, unspecified: Secondary | ICD-10-CM | POA: Diagnosis present

## 2019-10-29 DIAGNOSIS — R498 Other voice and resonance disorders: Secondary | ICD-10-CM | POA: Diagnosis not present

## 2019-10-29 DIAGNOSIS — Z7901 Long term (current) use of anticoagulants: Secondary | ICD-10-CM | POA: Diagnosis not present

## 2019-10-29 DIAGNOSIS — N179 Acute kidney failure, unspecified: Secondary | ICD-10-CM | POA: Diagnosis present

## 2019-10-29 DIAGNOSIS — R112 Nausea with vomiting, unspecified: Secondary | ICD-10-CM

## 2019-10-29 DIAGNOSIS — N401 Enlarged prostate with lower urinary tract symptoms: Secondary | ICD-10-CM | POA: Diagnosis present

## 2019-10-29 DIAGNOSIS — Z7401 Bed confinement status: Secondary | ICD-10-CM | POA: Diagnosis not present

## 2019-10-29 DIAGNOSIS — I420 Dilated cardiomyopathy: Secondary | ICD-10-CM | POA: Diagnosis not present

## 2019-10-29 DIAGNOSIS — Z8673 Personal history of transient ischemic attack (TIA), and cerebral infarction without residual deficits: Secondary | ICD-10-CM

## 2019-10-29 DIAGNOSIS — M2550 Pain in unspecified joint: Secondary | ICD-10-CM | POA: Diagnosis not present

## 2019-10-29 DIAGNOSIS — I5022 Chronic systolic (congestive) heart failure: Secondary | ICD-10-CM | POA: Diagnosis not present

## 2019-10-29 DIAGNOSIS — G319 Degenerative disease of nervous system, unspecified: Secondary | ICD-10-CM | POA: Diagnosis not present

## 2019-10-29 DIAGNOSIS — Z85820 Personal history of malignant melanoma of skin: Secondary | ICD-10-CM

## 2019-10-29 DIAGNOSIS — R351 Nocturia: Secondary | ICD-10-CM | POA: Diagnosis present

## 2019-10-30 ENCOUNTER — Other Ambulatory Visit: Payer: Self-pay

## 2019-10-30 ENCOUNTER — Encounter (HOSPITAL_COMMUNITY): Payer: Self-pay | Admitting: *Deleted

## 2019-10-30 ENCOUNTER — Emergency Department (HOSPITAL_COMMUNITY): Payer: PPO

## 2019-10-30 DIAGNOSIS — I480 Paroxysmal atrial fibrillation: Secondary | ICD-10-CM

## 2019-10-30 DIAGNOSIS — Z7901 Long term (current) use of anticoagulants: Secondary | ICD-10-CM

## 2019-10-30 DIAGNOSIS — J0100 Acute maxillary sinusitis, unspecified: Secondary | ICD-10-CM | POA: Diagnosis present

## 2019-10-30 DIAGNOSIS — I4821 Permanent atrial fibrillation: Secondary | ICD-10-CM | POA: Diagnosis present

## 2019-10-30 DIAGNOSIS — H3530 Unspecified macular degeneration: Secondary | ICD-10-CM | POA: Diagnosis present

## 2019-10-30 DIAGNOSIS — E785 Hyperlipidemia, unspecified: Secondary | ICD-10-CM | POA: Diagnosis present

## 2019-10-30 DIAGNOSIS — I420 Dilated cardiomyopathy: Secondary | ICD-10-CM | POA: Diagnosis present

## 2019-10-30 DIAGNOSIS — K219 Gastro-esophageal reflux disease without esophagitis: Secondary | ICD-10-CM | POA: Diagnosis present

## 2019-10-30 DIAGNOSIS — N289 Disorder of kidney and ureter, unspecified: Secondary | ICD-10-CM

## 2019-10-30 DIAGNOSIS — E039 Hypothyroidism, unspecified: Secondary | ICD-10-CM

## 2019-10-30 DIAGNOSIS — Z66 Do not resuscitate: Secondary | ICD-10-CM | POA: Diagnosis present

## 2019-10-30 DIAGNOSIS — N401 Enlarged prostate with lower urinary tract symptoms: Secondary | ICD-10-CM | POA: Diagnosis present

## 2019-10-30 DIAGNOSIS — I5022 Chronic systolic (congestive) heart failure: Secondary | ICD-10-CM

## 2019-10-30 DIAGNOSIS — F319 Bipolar disorder, unspecified: Secondary | ICD-10-CM | POA: Diagnosis present

## 2019-10-30 DIAGNOSIS — I495 Sick sinus syndrome: Secondary | ICD-10-CM | POA: Diagnosis present

## 2019-10-30 DIAGNOSIS — N1832 Chronic kidney disease, stage 3b: Secondary | ICD-10-CM | POA: Diagnosis present

## 2019-10-30 DIAGNOSIS — R351 Nocturia: Secondary | ICD-10-CM | POA: Diagnosis present

## 2019-10-30 DIAGNOSIS — Z8042 Family history of malignant neoplasm of prostate: Secondary | ICD-10-CM | POA: Diagnosis not present

## 2019-10-30 DIAGNOSIS — I2510 Atherosclerotic heart disease of native coronary artery without angina pectoris: Secondary | ICD-10-CM | POA: Diagnosis present

## 2019-10-30 DIAGNOSIS — Z20822 Contact with and (suspected) exposure to covid-19: Secondary | ICD-10-CM | POA: Diagnosis present

## 2019-10-30 DIAGNOSIS — N179 Acute kidney failure, unspecified: Secondary | ICD-10-CM | POA: Diagnosis present

## 2019-10-30 DIAGNOSIS — I081 Rheumatic disorders of both mitral and tricuspid valves: Secondary | ICD-10-CM | POA: Diagnosis present

## 2019-10-30 DIAGNOSIS — J189 Pneumonia, unspecified organism: Secondary | ICD-10-CM | POA: Diagnosis present

## 2019-10-30 DIAGNOSIS — J320 Chronic maxillary sinusitis: Secondary | ICD-10-CM | POA: Diagnosis present

## 2019-10-30 DIAGNOSIS — Z96652 Presence of left artificial knee joint: Secondary | ICD-10-CM | POA: Diagnosis present

## 2019-10-30 DIAGNOSIS — I130 Hypertensive heart and chronic kidney disease with heart failure and stage 1 through stage 4 chronic kidney disease, or unspecified chronic kidney disease: Secondary | ICD-10-CM | POA: Diagnosis present

## 2019-10-30 DIAGNOSIS — I482 Chronic atrial fibrillation, unspecified: Secondary | ICD-10-CM | POA: Diagnosis present

## 2019-10-30 HISTORY — DX: Pneumonia, unspecified organism: J18.9

## 2019-10-30 LAB — URINALYSIS, ROUTINE W REFLEX MICROSCOPIC
Bilirubin Urine: NEGATIVE
Glucose, UA: NEGATIVE mg/dL
Hgb urine dipstick: NEGATIVE
Ketones, ur: NEGATIVE mg/dL
Leukocytes,Ua: NEGATIVE
Nitrite: NEGATIVE
Protein, ur: NEGATIVE mg/dL
Specific Gravity, Urine: 1.011 (ref 1.005–1.030)
pH: 7 (ref 5.0–8.0)

## 2019-10-30 LAB — PROTIME-INR
INR: 2.3 — ABNORMAL HIGH (ref 0.8–1.2)
Prothrombin Time: 24.1 seconds — ABNORMAL HIGH (ref 11.4–15.2)

## 2019-10-30 LAB — I-STAT CHEM 8, ED
BUN: 31 mg/dL — ABNORMAL HIGH (ref 8–23)
Calcium, Ion: 1.19 mmol/L (ref 1.15–1.40)
Chloride: 100 mmol/L (ref 98–111)
Creatinine, Ser: 1.9 mg/dL — ABNORMAL HIGH (ref 0.61–1.24)
Glucose, Bld: 107 mg/dL — ABNORMAL HIGH (ref 70–99)
HCT: 47 % (ref 39.0–52.0)
Hemoglobin: 16 g/dL (ref 13.0–17.0)
Potassium: 4.2 mmol/L (ref 3.5–5.1)
Sodium: 137 mmol/L (ref 135–145)
TCO2: 26 mmol/L (ref 22–32)

## 2019-10-30 LAB — CBC
HCT: 47.2 % (ref 39.0–52.0)
Hemoglobin: 15.1 g/dL (ref 13.0–17.0)
MCH: 33.3 pg (ref 26.0–34.0)
MCHC: 32 g/dL (ref 30.0–36.0)
MCV: 104 fL — ABNORMAL HIGH (ref 80.0–100.0)
Platelets: 193 10*3/uL (ref 150–400)
RBC: 4.54 MIL/uL (ref 4.22–5.81)
RDW: 14.4 % (ref 11.5–15.5)
WBC: 8 10*3/uL (ref 4.0–10.5)
nRBC: 0 % (ref 0.0–0.2)

## 2019-10-30 LAB — COMPREHENSIVE METABOLIC PANEL
ALT: 12 U/L (ref 0–44)
AST: 48 U/L — ABNORMAL HIGH (ref 15–41)
Albumin: 3.8 g/dL (ref 3.5–5.0)
Alkaline Phosphatase: 75 U/L (ref 38–126)
Anion gap: 15 (ref 5–15)
BUN: 24 mg/dL — ABNORMAL HIGH (ref 8–23)
CO2: 18 mmol/L — ABNORMAL LOW (ref 22–32)
Calcium: 10 mg/dL (ref 8.9–10.3)
Chloride: 102 mmol/L (ref 98–111)
Creatinine, Ser: 1.83 mg/dL — ABNORMAL HIGH (ref 0.61–1.24)
GFR calc Af Amer: 38 mL/min — ABNORMAL LOW (ref >60)
GFR calc non Af Amer: 32 mL/min — ABNORMAL LOW (ref >60)
Glucose, Bld: 107 mg/dL — ABNORMAL HIGH (ref 70–99)
Potassium: 4.7 mmol/L (ref 3.5–5.1)
Sodium: 135 mmol/L (ref 135–145)
Total Bilirubin: 0.8 mg/dL (ref 0.3–1.2)
Total Protein: 7.8 g/dL (ref 6.5–8.1)

## 2019-10-30 LAB — SARS CORONAVIRUS 2 BY RT PCR (HOSPITAL ORDER, PERFORMED IN ~~LOC~~ HOSPITAL LAB): SARS Coronavirus 2: NEGATIVE

## 2019-10-30 LAB — CBC WITH DIFFERENTIAL/PLATELET
Abs Immature Granulocytes: 0.03 10*3/uL (ref 0.00–0.07)
Basophils Absolute: 0 10*3/uL (ref 0.0–0.1)
Basophils Relative: 0 %
Eosinophils Absolute: 0.2 10*3/uL (ref 0.0–0.5)
Eosinophils Relative: 2 %
HCT: 44.1 % (ref 39.0–52.0)
Hemoglobin: 14.5 g/dL (ref 13.0–17.0)
Immature Granulocytes: 0 %
Lymphocytes Relative: 19 %
Lymphs Abs: 1.6 10*3/uL (ref 0.7–4.0)
MCH: 34 pg (ref 26.0–34.0)
MCHC: 32.9 g/dL (ref 30.0–36.0)
MCV: 103.3 fL — ABNORMAL HIGH (ref 80.0–100.0)
Monocytes Absolute: 0.8 10*3/uL (ref 0.1–1.0)
Monocytes Relative: 10 %
Neutro Abs: 5.6 10*3/uL (ref 1.7–7.7)
Neutrophils Relative %: 69 %
Platelets: 178 10*3/uL (ref 150–400)
RBC: 4.27 MIL/uL (ref 4.22–5.81)
RDW: 14.3 % (ref 11.5–15.5)
WBC: 8.2 10*3/uL (ref 4.0–10.5)
nRBC: 0 % (ref 0.0–0.2)

## 2019-10-30 LAB — BASIC METABOLIC PANEL
Anion gap: 11 (ref 5–15)
BUN: 23 mg/dL (ref 8–23)
CO2: 27 mmol/L (ref 22–32)
Calcium: 10.2 mg/dL (ref 8.9–10.3)
Chloride: 98 mmol/L (ref 98–111)
Creatinine, Ser: 1.77 mg/dL — ABNORMAL HIGH (ref 0.61–1.24)
GFR calc Af Amer: 39 mL/min — ABNORMAL LOW (ref >60)
GFR calc non Af Amer: 34 mL/min — ABNORMAL LOW (ref >60)
Glucose, Bld: 99 mg/dL (ref 70–99)
Potassium: 4.3 mmol/L (ref 3.5–5.1)
Sodium: 136 mmol/L (ref 135–145)

## 2019-10-30 LAB — CBG MONITORING, ED: Glucose-Capillary: 104 mg/dL — ABNORMAL HIGH (ref 70–99)

## 2019-10-30 LAB — TROPONIN I (HIGH SENSITIVITY): Troponin I (High Sensitivity): 4 ng/L (ref <18)

## 2019-10-30 MED ORDER — SODIUM CHLORIDE 0.9 % IV BOLUS: 500.0000 mL | Freq: Once | INTRAVENOUS | Status: DC

## 2019-10-30 MED ORDER — VITAMIN B-12 1000 MCG PO TABS
1000.0000 ug | ORAL_TABLET | Freq: Every day | ORAL | Status: DC
  Administered 2019-10-30 – 2019-11-01 (×3): 1000 ug via ORAL
  Filled 2019-10-30 (×3): qty 1

## 2019-10-30 MED ORDER — ASCORBIC ACID 500 MG PO TABS
500.0000 mg | ORAL_TABLET | Freq: Every day | ORAL | Status: DC
  Administered 2019-10-30 – 2019-11-01 (×3): 500 mg via ORAL
  Filled 2019-10-30 (×3): qty 1

## 2019-10-30 MED ORDER — WARFARIN - PHARMACIST DOSING INPATIENT: Freq: Every day | Status: DC

## 2019-10-30 MED ORDER — WARFARIN SODIUM 2 MG PO TABS
2.0000 mg | ORAL_TABLET | Freq: Every day | ORAL | Status: DC
  Administered 2019-10-30: 2 mg via ORAL
  Filled 2019-10-30 (×2): qty 1

## 2019-10-30 MED ORDER — SODIUM CHLORIDE 0.9 % IV SOLN
500.0000 mg | Freq: Once | INTRAVENOUS | Status: AC
  Administered 2019-10-30: 500 mg via INTRAVENOUS
  Filled 2019-10-30: qty 500

## 2019-10-30 MED ORDER — SODIUM CHLORIDE 0.9 % IV SOLN
2.0000 g | INTRAVENOUS | Status: DC
  Administered 2019-10-31 (×2): 2 g via INTRAVENOUS
  Filled 2019-10-30: qty 20
  Filled 2019-10-30: qty 2
  Filled 2019-10-30: qty 20

## 2019-10-30 MED ORDER — AZITHROMYCIN 250 MG PO TABS
500.0000 mg | ORAL_TABLET | Freq: Every day | ORAL | Status: DC
  Administered 2019-10-31 – 2019-11-01 (×2): 500 mg via ORAL
  Filled 2019-10-30 (×2): qty 2

## 2019-10-30 MED ORDER — DEXTROSE IN LACTATED RINGERS 5 % IV SOLN: INTRAVENOUS | Status: DC

## 2019-10-30 MED ORDER — VITAMIN B-12 1000 MCG SL SUBL: 1.0000 | SUBLINGUAL_TABLET | Freq: Every day | SUBLINGUAL | Status: DC

## 2019-10-30 MED ORDER — SODIUM CHLORIDE 0.9 % IV SOLN
500.0000 mg | INTRAVENOUS | Status: DC
  Filled 2019-10-30: qty 500

## 2019-10-30 MED ORDER — APOAEQUORIN 10 MG PO CAPS: 1.0000 | ORAL_CAPSULE | Freq: Every day | ORAL | Status: DC

## 2019-10-30 MED ORDER — SODIUM CHLORIDE 0.9 % IV SOLN
1.0000 g | Freq: Once | INTRAVENOUS | Status: AC
  Administered 2019-10-30: 1 g via INTRAVENOUS
  Filled 2019-10-30: qty 10

## 2019-10-30 MED ORDER — VENLAFAXINE HCL ER 75 MG PO CP24
75.0000 mg | ORAL_CAPSULE | Freq: Every day | ORAL | Status: DC
  Administered 2019-10-30 – 2019-11-01 (×3): 75 mg via ORAL
  Filled 2019-10-30 (×4): qty 1

## 2019-10-30 MED ORDER — PROSIGHT PO TABS
1.0000 | ORAL_TABLET | Freq: Two times a day (BID) | ORAL | Status: DC
  Administered 2019-10-30 – 2019-11-01 (×5): 1 via ORAL
  Filled 2019-10-30 (×6): qty 1

## 2019-10-30 MED ORDER — METOPROLOL SUCCINATE ER 100 MG PO TB24
100.0000 mg | ORAL_TABLET | Freq: Every day | ORAL | Status: DC
  Administered 2019-10-30 – 2019-11-01 (×3): 100 mg via ORAL
  Filled 2019-10-30 (×3): qty 1

## 2019-10-30 MED ORDER — B COMPLEX-C PO TABS
1.0000 | ORAL_TABLET | Freq: Every day | ORAL | Status: DC
  Administered 2019-10-30 – 2019-11-01 (×3): 1 via ORAL
  Filled 2019-10-30 (×3): qty 1

## 2019-10-30 NOTE — Progress Notes (Signed)
ANTICOAGULATION CONSULT NOTE - Initial Consult  Pharmacy Consult for Warfarin Indication: atrial fibrillation  Allergies  Allergen Reactions  . Baclofen     Confusion/delerium    Patient Measurements:    Vital Signs: Temp: 97.6 F (36.4 C) (07/19 2350) BP: 138/106 (07/19 2350) Pulse Rate: 97 (07/19 2350)  Labs: Recent Labs    10/30/19 0010 10/30/19 0025  HGB 14.5 16.0  HCT 44.1 47.0  PLT 178  --   LABPROT 24.1*  --   INR 2.3*  --   CREATININE 1.83* 1.90*  TROPONINIHS 4  --     CrCl cannot be calculated (Unknown ideal weight.).   Medical History: Past Medical History:  Diagnosis Date  . Anxiety   . Arrhythmia   . Atrial fibrillation ([place])   . Basal cell cancer 2008   on shoulder  . Bipolar disorder [facility])    hospitalized in past  . Cardiomyopathy ([place])   . CHF (congestive heart failure) ([place])   . CVA (cerebral vascular accident) ([place]) 8/11  . Depression   . Dyslipidemia   . Generalized anxiety disorder   . GERD (gastroesophageal reflux disease)   . History of kidney stones    30 years ago  . Hypothyroidism   . Macular degeneration   . Melanoma ([place])    resected from scalp approximately 1 year ago.   . Osteoarthritis    in neck and left knee  . Tremor    noted when writing  . Valvular heart disease     Medications:  See electronic med rec  Assessment: 86 y.o. M presents with weakness and nausea. Pt on warfarin PTA for afib. Admission INR 2.3. CBC ok on admission. Home dose: 2mg  daily  Goal of Therapy:  INR 2-3 Monitor platelets by anticoagulation protocol: Yes   Plan:  Daily INR Coumadin 2mg  daily  Sherlon Handing, PharmD, BCPS Please see amion for complete clinical pharmacist phone list 10/30/2019,1:49 AM

## 2019-10-30 NOTE — ED Triage Notes (Signed)
Pt arrived from home for sudden onset of dizziness with NV. Pt reports falling at home, denies hitting his head, is on warfarin for afib. Pt had 4 episodes of vomiting with EMS. Initially unable to obtain radial pulses, then able to palpate bp at 190/0 after 38ml NS. On arrival, pt pale, diaphoretic, following all commands, and A&Ox3. EMS iv placed to L hand.

## 2019-10-30 NOTE — Progress Notes (Signed)
PHARMACIST - PHYSICIAN ORDER COMMUNICATION  CONCERNING: P&T Medication Policy on Herbal Medications  DESCRIPTION:  This patient's order for:  Apoaequorin  has been noted.  This product(s) is classified as an "herbal" or natural product. Due to a lack of definitive safety studies or FDA approval, nonstandard manufacturing practices, plus the potential risk of unknown drug-drug interactions while on inpatient medications, the Pharmacy and Therapeutics Committee does not permit the use of "herbal" or natural products of this type within [facility].   ACTION TAKEN: The pharmacy department is unable to verify this order at this time and your patient has been informed of this safety policy. Please reevaluate patient's clinical condition at discharge and address if the herbal or natural product(s) should be resumed at that time.  Sherlon Handing, PharmD, BCPS Please see amion for complete clinical pharmacist phone list 10/30/2019 2:03 AM

## 2019-10-30 NOTE — Progress Notes (Signed)
PROGRESS NOTE    David Duncan  [id number] DOB: 12-04-1930 DOA: 10/29/2019 PCP: Abner Greenspan, MD    Brief Narrative:  Patient admitted to the hospital with working diagnosis of community-acquired pneumonia.  86 year old male with past medical history for atrial fibrillation, hypertension, coronary artery disease, sick sinus syndrome, mitral and tricuspid valve regurgitation who presents to the hospital with generalized weakness, fatigue and vomiting.  His symptoms were worsening, on the day of admission he sustained a mechanical fall without loss of consciousness.  On his initial physical examination his blood pressure was 98/69, heart rate 80, respiratory rate 16, oxygen saturation 99%, temperature 97.6.  His lungs are clear to auscultation bilaterally, heart S1-S2, present rhythmic, his abdomen was soft, no lower extremity edema. Sodium 135, potassium 4.7, chloride 102, bicarb 18, glucose 107, BUN 24, creatinine 1.13, white count 8.2, hemoglobin 14.5, hematocrit 44.1, platelets 178.  SARS COVID-19 negative.  Urine analysis negative for infection.Chest radiograph had left lower lobe interstitial infiltrates.  EKG 91 bpm, normal axis, normal QRS, normal QTC, atrial fibrillation rhythm, no ST segment or T wave changes.    Assessment & Plan:   Principal Problem:   CAP (community acquired pneumonia) Active Problems:   Bipolar disorder ([place])   Long term (current) use of anticoagulants   Chronic systolic heart failure (HCC)   Cardiomyopathy, dilated (HCC)   AF (paroxysmal atrial fibrillation) (HCC)   Hypothyroidism   1. Community acquired pneumonia, left lower lobe, present on admission. Patient is responding well to the antibiotic therapy, he is feeling better, but not yet back to baseline. Wbc is 8,0, his blood cultures continue with no growth.  Will continue antibiotic therapy with ceftriaxone and azithromycin. Continue oxymetry monitoring. Will add as needed bronchodilator and  will request to be out of bed to chair tid with meals.   Physical and occupation therapy.   2. AKI on ckd stahe 3b. Renal function with serum cr at 1,77 from 1,90 with K at 4,3 and serum bicarbonate a 27.   Will add gentle hydration with balanced electrolyte solutions at 50 ml per H with dextrose.   3. Paroxysmal atrial fibrillation. Rate controlled with metoprolol XL 100 mg, continue anticoagulation with warfarin per pharmacy protocol. INR 2,3 today.   4. Hypothyroid. Continue with levothyroxine.   5. Chronic systolic heart failure, severe tricuspid and mitral valve regurgitation.   No sing of acute exacerbation, continue with telemetry monitoring.   6. HTN. Improved blood pressure, this am 118.98, will continue to hold on antihypertensive medications and will continue gentle hydration with IV fluids.   7. Bipolar. Continue with venlafaxine. No confusion or agitation.   If patient continue to improve, possible dc home in am.   Status is: Inpatient  Remains inpatient appropriate because:IV treatments appropriate due to intensity of illness or inability to take PO   Dispo: The patient is from: Home              Anticipated d/c is to: Home              Anticipated d/c date is: 1 day              Patient currently is not medically stable to d/c.   DVT prophylaxis: Warfarin   Code Status:   dnr   Family Communication:  I spoke with patient's wife at the bedside, we talked in detail about patient's condition, plan of care and prognosis and all questions were addressed.  Antimicrobials:   Azithromycin   Ceftriaxone     Subjective: Patient is feeling better but not yet back to baseline, no nausea or vomiting, no chest pain.   Objective: Vitals:   10/30/19 1230 10/30/19 1300 10/30/19 1330 10/30/19 1600  BP: (!) 110/91 101/75 104/77 (!) 118/98  Pulse: 82 82 81 89  Resp: 12 15 14 20   Temp:    98.2 F (36.8 C)  SpO2: 96% 96% 96% 98%    Intake/Output Summary (Last  24 hours) at 10/30/2019 1630 Last data filed at 10/29/2019 2345 Gross per 24 hour  Intake 400 ml  Output --  Net 400 ml   There were no vitals filed for this visit.  Examination:   General: deconditioned  Neurology: Awake and alert, non focal  E ENT: mild pallor, no icterus, oral mucosa moist Cardiovascular: No JVD. S1-S2 present, rhythmic, no gallops, rubs, or murmurs. No lower extremity edema. Pulmonary: positive breath sounds bilaterally, no wheezing, or rhonchi, mild bilateral rales. Gastrointestinal. Abdomen soft and non tender Skin. No rashes Musculoskeletal: no joint deformities     Data Reviewed: I have personally reviewed following labs and imaging studies  CBC: Recent Labs  Lab 10/30/19 0010 10/30/19 0025 10/30/19 0808  WBC 8.2  --  8.0  NEUTROABS 5.6  --   --   HGB 14.5 16.0 15.1  HCT 44.1 47.0 47.2  MCV 103.3*  --  104.0*  PLT 178  --  867   Basic Metabolic Panel: Recent Labs  Lab 10/30/19 0010 10/30/19 0025 10/30/19 0808  NA 135 137 136  K 4.7 4.2 4.3  CL 102 100 98  CO2 18*  --  27  GLUCOSE 107* 107* 99  BUN 24* 31* 23  CREATININE 1.83* 1.90* 1.77*  CALCIUM 10.0  --  10.2   GFR: CrCl cannot be calculated (Unknown ideal weight.). Liver Function Tests: Recent Labs  Lab 10/30/19 0010  AST 48*  ALT 12  ALKPHOS 75  BILITOT 0.8  PROT 7.8  ALBUMIN 3.8   No results for input(s): LIPASE, AMYLASE in the last 168 hours. No results for input(s): AMMONIA in the last 168 hours. Coagulation Profile: Recent Labs  Lab 10/30/19 0010  INR 2.3*   Cardiac Enzymes: No results for input(s): CKTOTAL, CKMB, CKMBINDEX, TROPONINI in the last 168 hours. BNP (last 3 results) No results for input(s): PROBNP in the last 8760 hours. HbA1C: No results for input(s): HGBA1C in the last 72 hours. CBG: Recent Labs  Lab 10/30/19 0013  GLUCAP 104*   Lipid Profile: No results for input(s): CHOL, HDL, LDLCALC, TRIG, CHOLHDL, LDLDIRECT in the last 72  hours. Thyroid Function Tests: No results for input(s): TSH, T4TOTAL, FREET4, T3FREE, THYROIDAB in the last 72 hours. Anemia Panel: No results for input(s): VITAMINB12, FOLATE, FERRITIN, TIBC, IRON, RETICCTPCT in the last 72 hours.    Radiology Studies: I have reviewed all of the imaging during this hospital visit personally     Scheduled Meds: . vitamin C  500 mg Oral Daily  . B-complex with vitamin C  1 tablet Oral Daily  . metoprolol succinate  100 mg Oral Daily  . multivitamin  1 tablet Oral BID  . venlafaxine XR  75 mg Oral Daily  . vitamin B-12  1,000 mcg Oral Daily  . warfarin  2 mg Oral q1600  . Warfarin - Pharmacist Dosing Inpatient   Does not apply q1600   Continuous Infusions: . [START ON 10/31/2019] azithromycin    . [START ON  10/31/2019] cefTRIAXone (ROCEPHIN)  IV       LOS: 0 days        Lindzey Zent Gerome Apley, MD

## 2019-10-30 NOTE — ED Notes (Signed)
Lunch Tray Ordered @ 1045 

## 2019-10-30 NOTE — ED Notes (Signed)
PT at bedside.

## 2019-10-30 NOTE — H&P (Signed)
TRH H&P   Patient Demographics:    David Duncan, is a 86 y.o. male  MRN: [id number]   DOB - 07/15/1930  Admit Date - 10/29/2019  Outpatient Primary MD for the patient is Tower, Wynelle Fanny, MD  Referring MD/NP/PA: Dr Zannie Kehr  Patient coming from: Home  Chief Complaint  Patient presents with  . Weakness  . Nausea      HPI:    David Duncan  is a 86 y.o. male, The patient has a diagnosis of permanent nonvalvular atrial fibrillation.  permanent atrial fibrillation on warfarin, hypertension, coronary artery disease, valvular heart disease, sick sinus syndrome, structural heart disease , presents to ED secondary to generalized weakness, fatigue, and vomiting this evening, wife at bedside assist with the history, reports he was at his usual state of health, but this evening he has been more frail, had a fall, with no loss of consciousness, no head trauma, no focal deficits, denies tingling, numbness, speech or altered mental status, no fever or chills, no chest pain, no dyspnea.  Report vomiting is nonbilious/ - IN ED CT head with no acute brain findings, creatinine elevated at 1.9 from baseline of 1.5, his chest x-ray significant for left basilar infiltrate, he is afebrile, no leukocytosis, it was started on IV Rocephin and azithromycin, triage hospitalist were consulted to admit.    Review of systems:    In addition to the HPI above, No Fever-chills, generalized weakness, fatigue No Headache, No changes with Vision or hearing, No problems swallowing food or Liquids, No Chest pain, Cough or Shortness of Breath, No Abdominal pain, reports nausea and vomiting, Bowel movements are regular, No Blood in stool or Urine, No dysuria, No new skin rashes or bruises, No new joints pains-aches,  No new weakness, tingling, numbness in any extremity, No recent weight gain or loss, No  polyuria, polydypsia or polyphagia, No significant Mental Stressors.  A full 10 point Review of Systems was done, except as stated above, all other Review of Systems were negative.   With Past History of the following :    Past Medical History:  Diagnosis Date  . Anxiety   . Arrhythmia   . Atrial fibrillation ([place])   . Basal cell cancer 2008   on shoulder  . Bipolar disorder [facility])    hospitalized in past  . Cardiomyopathy ([place])   . CHF (congestive heart failure) ([place])   . CVA (cerebral vascular accident) ([place]) 8/11  . Depression   . Dyslipidemia   . Generalized anxiety disorder   . GERD (gastroesophageal reflux disease)   . History of kidney stones    30 years ago  . Hypothyroidism   . Macular degeneration   . Melanoma ([place])    resected from scalp approximately 1 year ago.   . Osteoarthritis    in neck and left knee  . Tremor    noted when writing  .  Valvular heart disease       Past Surgical History:  Procedure Laterality Date  . BACK SURGERY  1991   L4-5  . CATARACT EXTRACTION W/ INTRAOCULAR LENS IMPLANT Bilateral   . EYE SURGERY    . JOINT REPLACEMENT    . KNEE ARTHROPLASTY Left 12/03/2015   Procedure: COMPUTER ASSISTED TOTAL KNEE ARTHROPLASTY;  Surgeon: Dereck Leep, MD;  Location: [facility] ORS;  Service: Orthopedics;  Laterality: Left;  . LUMBAR LAMINECTOMY/DECOMPRESSION MICRODISCECTOMY N/A 10/05/2017   Procedure: LUMBAR LAMINECTOMY/DECOMPRESSION MICRODISCECTOMY 3 LEVELS L2-5, CORPECTOMY;  Surgeon: Meade Maw, MD;  Location: [facility] ORS;  Service: Neurosurgery;  Laterality: N/A;  . SKIN CANCER EXCISION     melenoma on scalp      Social History:     Social History   Tobacco Use  . Smoking status: Never Smoker  . Smokeless tobacco: Never Used  Substance Use Topics  . Alcohol use: No    Alcohol/week: 0.0 standard drinks     Lives -at home with wife  Mobility -use walker    Family History :     Family History  Problem Relation Age of Onset   . Stroke Father   . Heart attack Father   . Cancer Brother        prostate  . Coronary artery disease Brother   . Hypertension Brother       Home Medications:   Prior to Admission medications   Medication Sig Start Date End Date Taking? Authorizing Provider  acetaminophen (TYLENOL) 500 MG tablet Take 500-1,000 mg by mouth every 6 (six) hours as needed for mild pain.   Yes [provider]  Amino Acids (FREE FORM AMINO ACID COMPLEX PO) Take 1 capsule by mouth 2 (two) times daily.    Yes [provider]  Apoaequorin (PREVAGEN) 10 MG CAPS Take 1 capsule by mouth daily.    Yes [provider]  Ascorbic Acid (VITAMIN C) 500 MG tablet Take 500 mg by mouth daily.    Yes [provider]  B Complex Vitamins (B COMPLEX PO) Take 1 tablet by mouth daily.    Yes [provider]  Cyanocobalamin (VITAMIN B-12) 1000 MCG SUBL Place 1 tablet under the tongue daily.    Yes [provider]  furosemide (LASIX) 20 MG tablet Take 20 mg by mouth daily.    Yes [provider]  Levomefolate Glucosamine (METHYLFOLATE PO) Take 1 tablet by mouth daily.   Yes [provider]  loperamide (IMODIUM) 2 MG capsule Take 2 mg by mouth as needed for diarrhea or loose stools.   Yes [provider]  Lysine 500 MG CAPS Take 1 capsule by mouth 2 (two) times daily.   Yes [provider]  MELATONIN PO Take 2 tablets by mouth at bedtime. Restful Sleep by [facility] : Hops Extract 4:1 (Humulus lupulus) (flower) Chamomile (Matricaria chamomilla) (flower) Passion Flower Extract (standardized to 4% vitexin) (Passiflora incarnate) (flower) 5 HTP (5-Hydroxtryptophan) (Griffonia simplicifolia) (seed)   Yes [provider]  metoprolol succinate (TOPROL-XL) 100 MG 24 hr tablet Take 1 tablet (100 mg total) by mouth daily. Take with or immediately following a meal. 01/04/17 10/30/19 Yes Hackney, Aura Fey, FNP  Multiple Vitamins-Minerals (PRESERVISION  AREDS 2) CAPS Take 1 capsule by mouth 2 (two) times daily.   Yes [provider]  OVER THE COUNTER MEDICATION Take 1 tablet by mouth at bedtime. Calcium-Mag-Potassium   Yes [provider]  OVER THE COUNTER MEDICATION Take 1 tablet by mouth  2 (two) times daily. [place]   Yes [provider]  OVER THE COUNTER MEDICATION Take 3 capsules by mouth daily. [place]   Yes [provider]  sacubitril-valsartan (ENTRESTO) 24-26 MG Take 1 tablet by mouth 2 (two) times daily. 08/26/16  Yes Alisa Graff, FNP  Specialty Vitamins Products (PROSTATE PO) Take 1 tablet by mouth daily.    Yes [provider]  venlafaxine XR (EFFEXOR-XR) 75 MG 24 hr capsule Take 75 mg by mouth every morning. 10/19/19  Yes [provider]  warfarin (COUMADIN) 2 MG tablet Take 2 mg by mouth daily. 06/04/19  Yes [provider]  escitalopram (LEXAPRO) 10 MG tablet Take 1 tablet (10 mg total) by mouth daily. Patient not taking: Reported on 10/30/2019 08/07/18   Tower, Wynelle Fanny, MD  polyethylene glycol [facility] / GLYCOLAX) packet Take 17 g by mouth daily. Patient not taking: Reported on 10/30/2019 11/23/17   Gladstone Lighter, MD  warfarin (COUMADIN) 3 MG tablet Take 1 tablet (3 mg total) by mouth daily. Patient not taking: Reported on 10/30/2019 11/22/17   Gladstone Lighter, MD     Allergies:     Allergies  Allergen Reactions  . Baclofen     Confusion/delerium     Physical Exam:   Vitals  Blood pressure 98/69, pulse 80, temperature 97.6 F (36.4 C), resp. rate 16, SpO2 99 %.   1. General frail elderly male laying in bed, in no apparent distress  2. Normal affect and insight, Not Suicidal or Homicidal, Awake Alert, Oriented X 3.  3. No F.N deficits, ALL C.Nerves Intact, Strength 5/5 all 4 extremities, Sensation intact all 4 extremities, Plantars down going.  4. Ears and Eyes appear Normal, Conjunctivae clear, PERRLA. Moist Oral Mucosa.  5. Supple Neck,  No JVD, No cervical lymphadenopathy appriciated, No Carotid Bruits.  6. Symmetrical Chest wall movement, Good air movement bilaterally, CTAB.  7. RRR, No Gallops, Rubs ,+ Murmurs, No Parasternal Heave.  8. Positive Bowel Sounds, Abdomen Soft, No tenderness, No organomegaly appriciated,No rebound -guarding or rigidity.  9.  No Cyanosis, Normal Skin Turgor, No Skin Rash or Bruise.  10. Good muscle tone,  joints appear normal , no effusions, Normal ROM.  11. No Palpable Lymph Nodes in Neck or Axillae     Data Review:    CBC Recent Labs  Lab 10/30/19 0010 10/30/19 0025  WBC 8.2  --   HGB 14.5 16.0  HCT 44.1 47.0  PLT 178  --   MCV 103.3*  --   MCH 34.0  --   MCHC 32.9  --   RDW 14.3  --   LYMPHSABS 1.6  --   MONOABS 0.8  --   EOSABS 0.2  --   BASOSABS 0.0  --    ------------------------------------------------------------------------------------------------------------------  Chemistries  Recent Labs  Lab 10/30/19 0010 10/30/19 0025  NA 135 137  K 4.7 4.2  CL 102 100  CO2 18*  --   GLUCOSE 107* 107*  BUN 24* 31*  CREATININE 1.83* 1.90*  CALCIUM 10.0  --   AST 48*  --   ALT 12  --   ALKPHOS 75  --   BILITOT 0.8  --    ------------------------------------------------------------------------------------------------------------------ CrCl cannot be calculated (Unknown ideal weight.). ------------------------------------------------------------------------------------------------------------------ No results for input(s): TSH, T4TOTAL, T3FREE, THYROIDAB in the last 72 hours.  Invalid input(s): FREET3  Coagulation profile Recent Labs  Lab 10/30/19 0010  INR 2.3*   ------------------------------------------------------------------------------------------------------------------- No results for input(s): DDIMER in the last 72  hours. -------------------------------------------------------------------------------------------------------------------  Cardiac  Enzymes No results for input(s): CKMB, TROPONINI, MYOGLOBIN in the last 168 hours.  Invalid input(s): CK ------------------------------------------------------------------------------------------------------------------ No results found for: BNP   ---------------------------------------------------------------------------------------------------------------  Urinalysis    Component Value Date/Time   COLORURINE YELLOW (A) 11/19/2017 0238   APPEARANCEUR CLEAR (A) 11/19/2017 0238   LABSPEC 1.014 11/19/2017 0238   PHURINE 5.0 11/19/2017 0238   GLUCOSEU NEGATIVE 11/19/2017 0238   HGBUR NEGATIVE 11/19/2017 0238   BILIRUBINUR NEGATIVE 11/19/2017 0238   KETONESUR NEGATIVE 11/19/2017 0238   PROTEINUR 30 (A) 11/19/2017 0238   UROBILINOGEN 0.2 11/12/2009 0632   NITRITE NEGATIVE 11/19/2017 0238   LEUKOCYTESUR NEGATIVE 11/19/2017 0238    ----------------------------------------------------------------------------------------------------------------   Imaging Results:    CT Head Wo Contrast  Result Date: 10/30/2019 CLINICAL DATA:  Weakness, lightheadedness and nausea. EXAM: CT HEAD WITHOUT CONTRAST TECHNIQUE: Contiguous axial images were obtained from the base of the skull through the vertex without intravenous contrast. COMPARISON:  November 19, 2017 FINDINGS: Brain: There is mild to moderate severity cerebral atrophy with widening of the extra-axial spaces and ventricular dilatation. There are areas of decreased attenuation within the white matter tracts of the supratentorial brain, consistent with microvascular disease changes. 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 Vascular: No hyperdense vessel or unexpected calcification. Skull: Normal. Negative for fracture or focal lesion. Sinuses/Orbits: A moderate sized air-fluid level is seen within the left maxillary sinus. Other: None. IMPRESSION: 1. Generalized cerebral atrophy. 2. No acute intracranial  abnormality. 3. Left maxillary sinus disease. Electronically Signed   By: Virgina Norfolk M.D.   On: 10/30/2019 00:41   DG Chest Portable 1 View  Result Date: 10/30/2019 CLINICAL DATA:  Vomiting, cough EXAM: PORTABLE CHEST 1 VIEW COMPARISON:  None. FINDINGS: The lungs are symmetrically expanded. There is focal pulmonary infiltrate within the left lung base, possibly infectious in the appropriate clinical setting. Right lung is clear. No pneumothorax or pleural effusion. Cardiac size is at the upper limits of normal. Pulmonary vascularity is normal. No acute bone abnormality. IMPRESSION: Left basilar pneumonia. Electronically Signed   By: Fidela Salisbury MD   On: 10/30/2019 00:23    My personal review of EKG: Rhythm A fib, Rate  95 /min, QTc 478   Assessment & Plan:    Active Problems:   Bipolar disorder ([place])   Long term (current) use of anticoagulants   Chronic systolic heart failure (HCC)   Cardiomyopathy, dilated (HCC)   AF (paroxysmal atrial fibrillation) (HCC)   Hypothyroidism   CAP (community acquired pneumonia)   Generalized weakness, frailty, fall -This is secondary to infectious process including his community-acquired pneumonia, as well his with generalized deconditioning, walker dependent at baseline. - consult PT. -CT head with no acute findings  Community-acquired pneumonia -Chest x-ray with left basilar pneumonia, will treat with IV Rocephin and azithromycin.  AKI on CKD stage IIIb -This is in the setting of nausea and vomiting, prerenal, will hold his Lasix and Entresto for now,. -Will hold on IV fluid giving CHF with EF of 30%.  Chronic systolic heart failure -Last EF 30% in 2019. -Hold Entresto given low blood pressure and renal function. -Hold Lasix, will hold on IV fluids as well given low EF as long his blood pressure is stable.  Hypertension -Blood pressure on the lower side, will continue metoprolol mainly for heart rate control. -Hold  Entresto  Paroxysmal A. Fib -On warfarin, therapeutic INR, pharmacy to dose. -Continue with metoprolol for heart  rate control.  Bipolar disorder -Continue with Effexor  SEVERE TRICUSPID AND MITRAL VALVE INSUFFICIENCY -As evident on previous echo, judicious utilization of IV fluid once needed  DVT Prophylaxis on warfarin  AM Labs Ordered, also please review Full Orders  Family Communication: Admission, patients condition and plan of care including tests being ordered have been discussed with the patient and wife at bedside who indicate understanding and agree with the plan and Code Status.  Code Status DNR, patient has DNR form at bedside, it was confirmed by wife and sent  Likely DC to  Home  Condition GUARDED    Consults called: none  Admission status: inpatient  Time spent in minutes : 60 MINUTES   Phillips Climes M.D on 10/30/2019 at 2:00 AM   [facility] - Office  [phone]

## 2019-10-30 NOTE — ED Provider Notes (Signed)
[facility] EMERGENCY DEPARTMENT Provider Note   CSN: [id number] Arrival date & time: 10/29/19  2338     History Chief Complaint  Patient presents with  . Weakness  . Nausea    David Duncan is a 86 y.o. male.  The history is provided by the EMS personnel and the patient.  Weakness Severity:  Severe Onset quality:  Sudden Timing:  Constant Progression:  Unchanged Chronicity:  New Context: not dehydration and not drug use   Relieved by: IVF. Worsened by:  Nothing Ineffective treatments:  None tried Associated symptoms: falls, nausea, near-syncope and vomiting   Associated symptoms: no abdominal pain, no anorexia, no aphasia, no arthralgias, no ataxia, no chest pain, no cough, no diarrhea, no dysuria, no numbness in extremities, no fever, no loss of consciousness, no seizures, no shortness of breath, no stroke symptoms and no vision change   Nausea:    Severity:  Moderate   Onset quality:  Gradual   Timing:  Constant   Progression:  Partially resolved Vomiting:    Quality:  Stomach contents   Number of occurrences:  4   Severity:  Moderate   Timing:  Sporadic   Progression:  Unchanged Risk factors: congestive heart failure   Patient with complex PMH presents with nausea, vomiting x 4 and lightheaded and initially low BP for EMS.  No focal weakness, numbness, no changes in vision or speech.  No CP, no SOB but patient was reportedly diaphoretic.       Past Medical History:  Diagnosis Date  . Anxiety   . Arrhythmia   . Atrial fibrillation ([place])   . Basal cell cancer 2008   on shoulder  . Bipolar disorder [facility])    hospitalized in past  . Cardiomyopathy ([place])   . CHF (congestive heart failure) ([place])   . CVA (cerebral vascular accident) ([place]) 8/11  . Depression   . Dyslipidemia   . Generalized anxiety disorder   . GERD (gastroesophageal reflux disease)   . History of kidney stones    30 years ago  . Hypothyroidism   . Macular degeneration    . Melanoma ([place])    resected from scalp approximately 1 year ago.   . Osteoarthritis    in neck and left knee  . Tremor    noted when writing  . Valvular heart disease     Patient Active Problem List   Diagnosis Date Noted  . Heartburn 08/07/2018  . Neurogenic claudication 10/05/2017  . Preoperative examination 09/26/2017  . H/O falling 05/02/2017  . Elevated serum creatinine 05/02/2017  . Depression 11/12/2016  . Right shoulder injury 05/26/2016  . Pain in joint, shoulder region 04/02/2016  . BPH associated with nocturia 02/17/2016  . Mass in the abdomen 02/09/2016  . Disequilibrium 01/29/2016  . Unsteadiness 01/29/2016  . Family history of prostate cancer 01/07/2016  . Hypothyroidism 01/07/2016  . S/P total knee arthroplasty 12/03/2015  . Routine general medical examination at a health care facility 08/08/2015  . Spinal stenosis in cervical region 02/05/2015  . Cervical spinal stenosis 02/02/2015  . Left shoulder pain 12/06/2014  . Cardiomyopathy, dilated ([place]) 09/16/2014  . Cerebral vascular accident ([place]) 09/16/2014  . Heart valve disease 09/16/2014  . MI (mitral incompetence) 08/08/2014  . TI (tricuspid incompetence) 08/08/2014  . Atrial fibrillation, chronic ([place]) 01/16/2014  . Chronic systolic heart failure ([place]) 01/16/2014  . Bunion, right foot 08/17/2013  . Macular degeneration 10/16/2012  . Encounter for Medicare annual wellness exam 08/16/2012  .  Hearing decreased 12/16/2010  . Medication adverse effect 07/28/2010  . Prostate cancer screening 07/28/2010  . Long term (current) use of anticoagulants 07/22/2010  . Hyperlipidemia 06/01/2010  . ATRIAL FLUTTER 11/28/2009  . Bipolar disorder ([place]) 02/20/2009    Past Surgical History:  Procedure Laterality Date  . BACK SURGERY  1991   L4-5  . CATARACT EXTRACTION W/ INTRAOCULAR LENS IMPLANT Bilateral   . EYE SURGERY    . JOINT REPLACEMENT    . KNEE ARTHROPLASTY Left 12/03/2015   Procedure: COMPUTER  ASSISTED TOTAL KNEE ARTHROPLASTY;  Surgeon: Dereck Leep, MD;  Location: [facility] ORS;  Service: Orthopedics;  Laterality: Left;  . LUMBAR LAMINECTOMY/DECOMPRESSION MICRODISCECTOMY N/A 10/05/2017   Procedure: LUMBAR LAMINECTOMY/DECOMPRESSION MICRODISCECTOMY 3 LEVELS L2-5, CORPECTOMY;  Surgeon: Meade Maw, MD;  Location: [facility] ORS;  Service: Neurosurgery;  Laterality: N/A;  . SKIN CANCER EXCISION     melenoma on scalp       Family History  Problem Relation Age of Onset  . Stroke Father   . Heart attack Father   . Cancer Brother        prostate  . Coronary artery disease Brother   . Hypertension Brother     Social History   Tobacco Use  . Smoking status: Never Smoker  . Smokeless tobacco: Never Used  Vaping Use  . Vaping Use: Never used  Substance Use Topics  . Alcohol use: No    Alcohol/week: 0.0 standard drinks  . Drug use: No    Home Medications Prior to Admission medications   Medication Sig Start Date End Date Taking? Authorizing Provider  Acetaminophen 500 MG coapsule Take 2 capsules by mouth every 6 (six) hours as needed for pain.     [provider]  Amino Acids (FREE FORM AMINO ACID COMPLEX PO) Take 1 capsule by mouth 2 (two) times daily.     [provider]  Apoaequorin (PREVAGEN) 10 MG CAPS Take by mouth daily.    [provider]  Ascorbic Acid (VITAMIN C) 500 MG tablet Take 500 mg by mouth 2 (two) times daily.     [provider]  B Complex Vitamins (B COMPLEX PO) Take 1 tablet by mouth 2 (two) times daily.     [provider]  Calcium-Magnesium-Vitamin D (CALCIUM 500 PO) Take 2 tablets by mouth daily.    [provider]  Cyanocobalamin (VITAMIN B-12) 1000 MCG SUBL Place 1 tablet under the tongue daily.     [provider]  escitalopram (LEXAPRO) 10 MG tablet Take 1 tablet (10 mg total) by mouth daily. 08/07/18   Tower, Wynelle Fanny, MD  furosemide (LASIX) 20 MG tablet Take 20 mg by mouth daily as needed  for fluid.     [provider]  loperamide (IMODIUM) 2 MG capsule Take 2 mg by mouth as needed for diarrhea or loose stools.    [provider]  MELATONIN PO Take 2 tablets by mouth at bedtime. Restful Sleep by [facility] : Hops Extract 4:1 (Humulus lupulus) (flower) Chamomile (Matricaria chamomilla) (flower) Passion Flower Extract (standardized to 4% vitexin) (Passiflora incarnate) (flower) 5 HTP (5-Hydroxtryptophan) (Griffonia simplicifolia) (seed)    [provider]  metoprolol succinate (TOPROL-XL) 100 MG 24 hr tablet Take 1 tablet (100 mg total) by mouth daily. Take with or immediately following a meal. 01/04/17 06/20/19  Alisa Graff, FNP  Multiple Vitamin (MULTIVITAMIN) capsule Take 1 capsule by mouth daily.    [provider]  polyethylene glycol (MIRALAX / GLYCOLAX) packet  Take 17 g by mouth daily. 11/23/17   Gladstone Lighter, MD  sacubitril-valsartan (ENTRESTO) 24-26 MG Take 1 tablet by mouth 2 (two) times daily. 08/26/16   Alisa Graff, FNP  Specialty Vitamins Products (ICAPS LUTEIN-ZEAXANTHIN PO) Take 2 capsules by mouth daily.    [provider]  Specialty Vitamins Products (PROSTATE PO) Take 1 tablet by mouth daily.     [provider]  warfarin (COUMADIN) 3 MG tablet Take 1 tablet (3 mg total) by mouth daily. Patient taking differently: Take 2 mg by mouth daily.  11/22/17   Gladstone Lighter, MD    Allergies    Baclofen  Review of Systems   Review of Systems  Constitutional: Negative for fever.  HENT: Negative for congestion.   Eyes: Negative for visual disturbance.  Respiratory: Negative for cough and shortness of breath.   Cardiovascular: Positive for near-syncope. Negative for chest pain.  Gastrointestinal: Positive for nausea and vomiting. Negative for abdominal pain, anorexia and diarrhea.  Genitourinary: Negative for dysuria.  Musculoskeletal: Positive for falls. Negative for arthralgias.  Skin: Negative for wound.   Neurological: Positive for weakness and light-headedness. Negative for seizures, loss of consciousness, facial asymmetry and speech difficulty.  Psychiatric/Behavioral: Negative for agitation.  All other systems reviewed and are negative.   Physical Exam Updated Vital Signs BP (!) 138/106   Pulse 97   Temp 97.6 F (36.4 C)   Resp 16   SpO2 99%   Physical Exam Vitals and nursing note reviewed.  Constitutional:      Appearance: Normal appearance. He is not diaphoretic.  HENT:     Head: Normocephalic and atraumatic.     Nose: Nose normal.     Mouth/Throat:     Mouth: Mucous membranes are moist.     Pharynx: Oropharynx is clear.  Eyes:     Conjunctiva/sclera: Conjunctivae normal.     Pupils: Pupils are equal, round, and reactive to light.  Cardiovascular:     Rate and Rhythm: Normal rate. Rhythm irregular.     Pulses: Normal pulses.     Heart sounds: Normal heart sounds.  Pulmonary:     Breath sounds: No stridor. Rales present. No rhonchi.  Abdominal:     General: Abdomen is flat. Bowel sounds are normal.     Palpations: Abdomen is soft.     Tenderness: There is no abdominal tenderness. There is no guarding.  Musculoskeletal:        General: Normal range of motion.     Cervical back: Normal range of motion and neck supple.  Skin:    General: Skin is warm and dry.     Capillary Refill: Capillary refill takes less than 2 seconds.  Neurological:     General: No focal deficit present.     Mental Status: He is alert and oriented to person, place, and time.     Deep Tendon Reflexes: Reflexes normal.  Psychiatric:        Mood and Affect: Mood normal.        Behavior: Behavior normal.     ED Results / Procedures / Treatments   Labs (all labs ordered are listed, but only abnormal results are displayed) Results for orders placed or performed during the hospital encounter of 10/29/19  CBC with Differential/Platelet  Result Value Ref Range   WBC 8.2 4.0 - 10.5 K/uL   RBC  4.27 4.22 - 5.81 MIL/uL   Hemoglobin 14.5 13.0 - 17.0 g/dL   HCT 44.1 39 - 52 %  MCV 103.3 (H) 80.0 - 100.0 fL   MCH 34.0 26.0 - 34.0 pg   MCHC 32.9 30.0 - 36.0 g/dL   RDW 14.3 11.5 - 15.5 %   Platelets 178 150 - 400 K/uL   nRBC 0.0 0.0 - 0.2 %   Neutrophils Relative % 69 %   Neutro Abs 5.6 1.7 - 7.7 K/uL   Lymphocytes Relative 19 %   Lymphs Abs 1.6 0.7 - 4.0 K/uL   Monocytes Relative 10 %   Monocytes Absolute 0.8 0 - 1 K/uL   Eosinophils Relative 2 %   Eosinophils Absolute 0.2 0 - 0 K/uL   Basophils Relative 0 %   Basophils Absolute 0.0 0 - 0 K/uL   Immature Granulocytes 0 %   Abs Immature Granulocytes 0.03 0.00 - 0.07 K/uL  Protime-INR  Result Value Ref Range   Prothrombin Time 24.1 (H) 11.4 - 15.2 seconds   INR 2.3 (H) 0.8 - 1.2  I-stat chem 8, ED (not at [facility] or [facility])  Result Value Ref Range   Sodium 137 135 - 145 mmol/L   Potassium 4.2 3.5 - 5.1 mmol/L   Chloride 100 98 - 111 mmol/L   BUN 31 (H) 8 - 23 mg/dL   Creatinine, Ser 1.90 (H) 0.61 - 1.24 mg/dL   Glucose, Bld 107 (H) 70 - 99 mg/dL   Calcium, Ion 1.19 1.15 - 1.40 mmol/L   TCO2 26 22 - 32 mmol/L   Hemoglobin 16.0 13.0 - 17.0 g/dL   HCT 47.0 39 - 52 %  CBG monitoring, ED  Result Value Ref Range   Glucose-Capillary 104 (H) 70 - 99 mg/dL   CT Head Wo Contrast  Result Date: 10/30/2019 CLINICAL DATA:  Weakness, lightheadedness and nausea. EXAM: CT HEAD WITHOUT CONTRAST TECHNIQUE: Contiguous axial images were obtained from the base of the skull through the vertex without intravenous contrast. COMPARISON:  November 19, 2017 FINDINGS: Brain: There is mild to moderate severity cerebral atrophy with widening of the extra-axial spaces and ventricular dilatation. There are areas of decreased attenuation within the white matter tracts of the supratentorial brain, consistent with microvascular disease changes. 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 Vascular: No hyperdense vessel or  unexpected calcification. Skull: Normal. Negative for fracture or focal lesion. Sinuses/Orbits: A moderate sized air-fluid level is seen within the left maxillary sinus. Other: None. IMPRESSION: 1. Generalized cerebral atrophy. 2. No acute intracranial abnormality. 3. Left maxillary sinus disease. Electronically Signed   By: Virgina Norfolk M.D.   On: 10/30/2019 00:41   DG Chest Portable 1 View  Result Date: 10/30/2019 CLINICAL DATA:  Vomiting, cough EXAM: PORTABLE CHEST 1 VIEW COMPARISON:  None. FINDINGS: The lungs are symmetrically expanded. There is focal pulmonary infiltrate within the left lung base, possibly infectious in the appropriate clinical setting. Right lung is clear. No pneumothorax or pleural effusion. Cardiac size is at the upper limits of normal. Pulmonary vascularity is normal. No acute bone abnormality. IMPRESSION: Left basilar pneumonia. Electronically Signed   By: Fidela Salisbury MD   On: 10/30/2019 00:23    EKG EKG Interpretation  Date/Time:  Monday October 29 2019 23:41:53 EDT Ventricular Rate:  95 PR Interval:    QRS Duration: 118 QT Interval:  384 QTC Calculation: 478 R Axis:   39 Text Interpretation: Atrial fibrillation Nonspecific intraventricular conduction delay Confirmed by Toree Edling ([id number]) on 10/29/2019 11:58:16 PM   Radiology CT Head Wo Contrast  Result Date: 10/30/2019  CLINICAL DATA:  Weakness, lightheadedness and nausea. EXAM: CT HEAD WITHOUT CONTRAST TECHNIQUE: Contiguous axial images were obtained from the base of the skull through the vertex without intravenous contrast. COMPARISON:  November 19, 2017 FINDINGS: Brain: There is mild to moderate severity cerebral atrophy with widening of the extra-axial spaces and ventricular dilatation. There are areas of decreased attenuation within the white matter tracts of the supratentorial brain, consistent with microvascular disease changes. A chronic infarct is again seen in the posterior aspect of the left  external capsule. Vascular: No hyperdense vessel or unexpected calcification. Skull: Normal. Negative for fracture or focal lesion. Sinuses/Orbits: A moderate sized air-fluid level is seen within the left maxillary sinus. Other: None. IMPRESSION: 1. Generalized cerebral atrophy. 2. No acute intracranial abnormality. 3. Left maxillary sinus disease. Electronically Signed   By: Virgina Norfolk M.D.   On: 10/30/2019 00:41   DG Chest Portable 1 View  Result Date: 10/30/2019 CLINICAL DATA:  Vomiting, cough EXAM: PORTABLE CHEST 1 VIEW COMPARISON:  None. FINDINGS: The lungs are symmetrically expanded. There is focal pulmonary infiltrate within the left lung base, possibly infectious in the appropriate clinical setting. Right lung is clear. No pneumothorax or pleural effusion. Cardiac size is at the upper limits of normal. Pulmonary vascularity is normal. No acute bone abnormality. IMPRESSION: Left basilar pneumonia. Electronically Signed   By: Fidela Salisbury MD   On: 10/30/2019 00:23    Procedures Procedures (including critical care time)  Medications Ordered in ED Medications  cefTRIAXone (ROCEPHIN) 1 g in sodium chloride 0.9 % 100 mL IVPB (has no administration in time range)  azithromycin (ZITHROMAX) 500 mg in sodium chloride 0.9 % 250 mL IVPB (has no administration in time range)  sodium chloride 0.9 % bolus 500 mL (has no administration in time range)    ED Course  I have reviewed the triage vital signs and the nursing notes.  Pertinent labs & imaging results that were available during my care of the patient were reviewed by me and considered in my medical decision making (see chart for details).    Final Clinical Impression(s) / ED Diagnoses Final diagnoses:  Community acquired pneumonia, unspecified laterality  Renal insufficiency  Non-intractable vomiting with nausea, unspecified vomiting type  Acute maxillary sinusitis, recurrence not specified    Will admit to medicine.  I suspect  the patient may have vagaled with emesis causing low BP   Baldomero Mirarchi, MD 10/30/19 0127

## 2019-10-30 NOTE — Evaluation (Signed)
Physical Therapy Evaluation Patient Details Name: David Duncan MRN: [id number] DOB: 1931/03/06 Today's Date: 10/30/2019   History of Present Illness  86yo male presenting ot the ED with weakness, fatigue, vomiting, and a fall at home. CTH negative for acute changes, CXR shows L lobe pneumonia. PMH A-fib, bipolar disorder, cardiomyopathy, CHF, CVA, anxiety, macular degeneration, valvular and structural heart disease, sick sinus syndrome, lumbar surgery, TKA  Clinical Impression   Patient received in bed, very pleasant and willing to participate in therapy. See below for mobility/assist levels. Often states "Ok you're going to have to do it" when asked to perform tasks like sit at EOB but able to participate with multimodal cues and physical assist to initiate. Demonstrates poor sitting and standing balance with need for Min-ModA to maintain upright due to strong posterior lean in all sitting/standing positions. Able to take side steps alongside EOB but again with min-ModA due to balance/safety concerns. Left in bed with all needs met this morning. Will definitely benefit from SNF for ongoing rehab prior to return home due to gross weakness and high fall risk.     Follow Up Recommendations SNF;Supervision/Assistance - 24 hour    Equipment Recommendations  None recommended by PT (well equipped)    Recommendations for Other Services       Precautions / Restrictions Precautions Precautions: Fall;Other (comment) Precaution Comments: watch vitals, posterior LOB, possibly orthostatic Restrictions Weight Bearing Restrictions: No      Mobility  Bed Mobility Overal bed mobility: Needs Assistance Bed Mobility: Supine to Sit;Sit to Supine     Supine to sit: Min assist Sit to supine: Mod assist   General bed mobility comments: Mod cues to initiate bed mobility but this faded to [facility] after patient initiated movement; ModA for back to supine for BLE management  Transfers Overall transfer  level: Needs assistance Equipment used: 2 person hand held assist Transfers: Sit to/from Stand Sit to Stand: Min assist         General transfer comment: MinA to boost to full standing position with 2 person HHA for safety but with strong posterior lean in standing  Ambulation/Gait             General Gait Details: deferred- poor balance but able to take side steps along EOB with Min-ModA and HHA of 2 people  Stairs            Wheelchair Mobility    Modified Rankin (Stroke Patients Only)       Balance Overall balance assessment: Needs assistance;History of Falls Sitting-balance support: Single extremity supported;Feet unsupported Sitting balance-Leahy Scale: Poor Sitting balance - Comments: Min-ModA to maintain sitting balance at EOB Postural control: Posterior lean Standing balance support: Bilateral upper extremity supported;During functional activity Standing balance-Leahy Scale: Poor Standing balance comment: Min-ModA to maintain standing balance especially with more dynamic tasks                             Pertinent Vitals/Pain Pain Assessment: No/denies pain    Home Living Family/patient expects to be discharged to:: Private residence Living Arrangements: Spouse/significant other Available Help at Discharge: Family;Available PRN/intermittently Type of Home: House Home Access: Stairs to enter Entrance Stairs-Rails: Can reach both Entrance Stairs-Number of Steps: 1 Home Layout: One level Home Equipment: Shower seat - built in;Walker - 2 wheels;Cane - single point;Bedside commode;Grab bars - tub/shower Additional Comments: has stationary bike but does not use it; recent fall due to dizziness walking from  bathroom to bed    Prior Function Level of Independence: Independent with assistive device(s)         Comments: fairly indepedent with mobility but wife does help with things like managing door in shower, cooking, but patient can get  dressed himself most of the time     Hand Dominance   Dominant Hand: Right    Extremity/Trunk Assessment   Upper Extremity Assessment Upper Extremity Assessment: Generalized weakness    Lower Extremity Assessment Lower Extremity Assessment: Generalized weakness    Cervical / Trunk Assessment Cervical / Trunk Assessment: Kyphotic;Other exceptions Cervical / Trunk Exceptions: scoliotic  Communication   Communication: No difficulties  Cognition Arousal/Alertness: Awake/alert Behavior During Therapy: WFL for tasks assessed/performed Overall Cognitive Status: No family/caregiver present to determine baseline cognitive functioning Area of Impairment: Orientation;Attention;Memory;Following commands;Safety/judgement;Awareness;Problem solving                 Orientation Level: Person;Place;Time;Situation Current Attention Level: Selective Memory: Decreased short-term memory Following Commands: Follows one step commands consistently;Follows one step commands with increased time Safety/Judgement: Decreased awareness of safety;Decreased awareness of deficits Awareness: Intellectual Problem Solving: Slow processing;Decreased initiation;Requires verbal cues;Requires tactile cues;Difficulty sequencing General Comments: often will state "OK go ahead and do it" when asked to do things like sit at EOB, needed multimodal cues to initiate and does have slow processing. Kept blaming stretcher for poor sitting balance.      General Comments General comments (skin integrity, edema, etc.): HR up to 99BPM, SPO2 95%> RA, uanble to get accurate BP measures/orthostatics due to machine error    Exercises     Assessment/Plan    PT Assessment Patient needs continued PT services  PT Problem List Decreased strength;Decreased cognition;Decreased activity tolerance;Decreased safety awareness;Decreased balance;Decreased mobility;Cardiopulmonary status limiting activity;Decreased  coordination;Decreased knowledge of use of DME       PT Treatment Interventions DME instruction;Balance training;Gait training;Stair training;Cognitive remediation;Functional mobility training;Patient/family education;Therapeutic activities;Therapeutic exercise    PT Goals (Current goals can be found in the Care Plan section)  Acute Rehab PT Goals Patient Stated Goal: go home PT Goal Formulation: With patient Time For Goal Achievement: 11/13/19 Potential to Achieve Goals: Fair    Frequency Min 2X/week   Barriers to discharge        Co-evaluation               AM-PAC PT "6 Clicks" Mobility  Outcome Measure Help needed turning from your back to your side while in a flat bed without using bedrails?: A Little Help needed moving from lying on your back to sitting on the side of a flat bed without using bedrails?: A Lot Help needed moving to and from a bed to a chair (including a wheelchair)?: A Lot Help needed standing up from a chair using your arms (e.g., wheelchair or bedside chair)?: A Little Help needed to walk in hospital room?: A Lot Help needed climbing 3-5 steps with a railing? : A Lot 6 Click Score: 14    End of Session Equipment Utilized During Treatment: Gait belt Activity Tolerance: Patient tolerated treatment well Patient left: in bed;with call bell/phone within reach   PT Visit Diagnosis: Unsteadiness on feet (R26.81);Difficulty in walking, not elsewhere classified (R26.2);Muscle weakness (generalized) (M62.81);History of falling (Z91.81)    Time: 1017-5102 PT Time Calculation (min) (ACUTE ONLY): 27 min   Charges:   PT Evaluation $PT Eval Moderate Complexity: 1 Mod PT Treatments $Therapeutic Activity: 8-22 mins        Atari Novick U PT, DPT, PN1  Supplemental Physical Therapist [place]    Pager [phone] Acute Rehab Office [phone]

## 2019-10-31 LAB — BASIC METABOLIC PANEL
Anion gap: 10 (ref 5–15)
BUN: 21 mg/dL (ref 8–23)
CO2: 26 mmol/L (ref 22–32)
Calcium: 9.3 mg/dL (ref 8.9–10.3)
Chloride: 100 mmol/L (ref 98–111)
Creatinine, Ser: 1.82 mg/dL — ABNORMAL HIGH (ref 0.61–1.24)
GFR calc Af Amer: 38 mL/min — ABNORMAL LOW (ref >60)
GFR calc non Af Amer: 33 mL/min — ABNORMAL LOW (ref >60)
Glucose, Bld: 99 mg/dL (ref 70–99)
Potassium: 4.3 mmol/L (ref 3.5–5.1)
Sodium: 136 mmol/L (ref 135–145)

## 2019-10-31 LAB — CBC WITH DIFFERENTIAL/PLATELET
Abs Immature Granulocytes: 0.03 10*3/uL (ref 0.00–0.07)
Basophils Absolute: 0 10*3/uL (ref 0.0–0.1)
Basophils Relative: 0 %
Eosinophils Absolute: 0.2 10*3/uL (ref 0.0–0.5)
Eosinophils Relative: 2 %
HCT: 38.7 % — ABNORMAL LOW (ref 39.0–52.0)
Hemoglobin: 12.9 g/dL — ABNORMAL LOW (ref 13.0–17.0)
Immature Granulocytes: 0 %
Lymphocytes Relative: 25 %
Lymphs Abs: 2.1 10*3/uL (ref 0.7–4.0)
MCH: 33.8 pg (ref 26.0–34.0)
MCHC: 33.3 g/dL (ref 30.0–36.0)
MCV: 101.3 fL — ABNORMAL HIGH (ref 80.0–100.0)
Monocytes Absolute: 1 10*3/uL (ref 0.1–1.0)
Monocytes Relative: 12 %
Neutro Abs: 5.2 10*3/uL (ref 1.7–7.7)
Neutrophils Relative %: 61 %
Platelets: 170 10*3/uL (ref 150–400)
RBC: 3.82 MIL/uL — ABNORMAL LOW (ref 4.22–5.81)
RDW: 14.2 % (ref 11.5–15.5)
WBC: 8.6 10*3/uL (ref 4.0–10.5)
nRBC: 0 % (ref 0.0–0.2)

## 2019-10-31 LAB — PROTIME-INR
INR: 3.2 — ABNORMAL HIGH (ref 0.8–1.2)
Prothrombin Time: 32 seconds — ABNORMAL HIGH (ref 11.4–15.2)

## 2019-10-31 NOTE — Progress Notes (Signed)
ANTICOAGULATION CONSULT NOTE - Initial Consult  Pharmacy Consult for Warfarin Indication: atrial fibrillation  Allergies  Allergen Reactions  . Baclofen     Confusion/delerium    Vital Signs: Temp: 98.3 F (36.8 C) (07/21 0806) BP: 123/87 (07/21 0806) Pulse Rate: 82 (07/21 0806)  Labs: Recent Labs    10/30/19 0010 10/30/19 0010 10/30/19 0025 10/30/19 0025 10/30/19 0808 10/31/19 0236  HGB 14.5   < > 16.0   < > 15.1 12.9*  HCT 44.1   < > 47.0  --  47.2 38.7*  PLT 178  --   --   --  193 170  LABPROT 24.1*  --   --   --   --  32.0*  INR 2.3*  --   --   --   --  3.2*  CREATININE 1.83*   < > 1.90*  --  1.77* 1.82*  TROPONINIHS 4  --   --   --   --   --    < > = values in this interval not displayed.    CrCl cannot be calculated (Unknown ideal weight.).   Medical History: Past Medical History:  Diagnosis Date  . Anxiety   . Arrhythmia   . Atrial fibrillation ([place])   . Basal cell cancer 2008   on shoulder  . Bipolar disorder [facility])    hospitalized in past  . Cardiomyopathy ([place])   . CHF (congestive heart failure) ([place])   . CVA (cerebral vascular accident) ([place]) 8/11  . Depression   . Dyslipidemia   . Generalized anxiety disorder   . GERD (gastroesophageal reflux disease)   . History of kidney stones    30 years ago  . Hypothyroidism   . Macular degeneration   . Melanoma ([place])    resected from scalp approximately 1 year ago.   . Osteoarthritis    in neck and left knee  . Pneumonia 10/30/2019  . Tremor    noted when writing  . Valvular heart disease     Assessment: 86 y.o. M presents with weakness and nausea. Pt on warfarin 2mg  daily PTA for afib with history of CVA. Admission INR 2.3. Hgb down 15.1>12.9. INR today is supratherapeutic at 3.2. No bleeding reported.   Goal of Therapy:  INR 2-3 Monitor platelets by anticoagulation protocol: Yes   Plan:  Hold Coumadin today Daily INR, monitor for S&S bleeding  Romilda Garret, PharmD PGY1 Acute Care  Pharmacy Resident Phone: [phone] 10/31/2019 11:37 AM  Please check [email or web address] for unit specific pharmacy phone numbers.

## 2019-10-31 NOTE — TOC Initial Note (Addendum)
Transition of Care [facility]) - Initial/Assessment Note    Patient Details  Name: David Duncan MRN: [id number] Date of Birth: 1930-07-05  Transition of Care [facility]) CM/SW Contact:    Benard Halsted, LCSW Phone Number: 10/31/2019, 11:45 AM  Clinical Narrative:                 CSW received consult for possible SNF placement at time of discharge. CSW spoke with patient regarding PT recommendation of SNF placement at time of discharge. Patient reported that patient's spouse is currently unable to care for patient at their home given patient's current physical needs and fall risk. Patient expressed understanding of PT recommendation and is agreeable to SNF placement at time of discharge. Patient reports preference for [facility] as he has been there several times before. CSW discussed insurance authorization process and provided Medicare SNF ratings list. CSW left voicemail for [facility] regarding bed availability and started insurance authorization with [employer], including ambulance request. Patient has received COVID vaccines and will be able to have visitors at [facility]. Patient expressed being hopeful for rehab and to feel better soon. No further questions reported at this time.   Update: [facility] does not have a bed available at this time, though they would have liked to have him back. CSW awaiting other SNF bed offers.    Expected Discharge Plan: Skilled Nursing Facility Barriers to Discharge: Insurance Authorization, SNF Pending bed offer   Patient Goals and CMS Choice Patient states their goals for this hospitalization and ongoing recovery are:: Rehab CMS Medicare.gov Compare Post Acute Care list provided to:: Patient Choice offered to / list presented to : Patient  Expected Discharge Plan and Services Expected Discharge Plan: [place] In-house Referral: Clinical Social Work   Post Acute Care Choice: [place] Living arrangements for the past 2 months:  [place]                                      Prior Living Arrangements/Services Living arrangements for the past 2 months: Single Family Home Lives with:: Spouse Patient language and need for interpreter reviewed:: Yes Do you feel safe going back to the place where you live?: Yes      Need for Family Participation in Patient Care: Yes (Comment) Care giver support system in place?: Yes (comment)   Criminal Activity/Legal Involvement Pertinent to Current Situation/Hospitalization: No - Comment as needed  Activities of Daily Living Home Assistive Devices/Equipment: Gilford Rile (specify type) ADL Screening (condition at time of admission) Patient's cognitive ability adequate to safely complete daily activities?: Yes Is the patient deaf or have difficulty hearing?: No Does the patient have difficulty seeing, even when wearing glasses/contacts?: No Does the patient have difficulty concentrating, remembering, or making decisions?: Yes Patient able to express need for assistance with ADLs?: Yes Does the patient have difficulty dressing or bathing?: No Independently performs ADLs?: Yes (appropriate for developmental age) Does the patient have difficulty walking or climbing stairs?: Yes Weakness of Legs: Both Weakness of Arms/Hands: None  Permission Sought/Granted Permission sought to share information with : Facility Art therapist granted to share information with : Yes, Verbal Permission Granted     Permission granted to share info w AGENCY: SNFs        Emotional Assessment Appearance:: Appears stated age Attitude/Demeanor/Rapport: Engaged, Gracious Affect (typically observed): Accepting, Appropriate, Pleasant Orientation: : Oriented to Self, Oriented  to Place, Oriented to  Time, Oriented to Situation Alcohol / Substance Use: Not Applicable Psych Involvement: No (comment)  Admission diagnosis:  Renal insufficiency [N28.9] CAP (community acquired  pneumonia) [J18.9] Acute maxillary sinusitis, recurrence not specified [J01.00] Non-intractable vomiting with nausea, unspecified vomiting type [R11.2] Community acquired pneumonia, unspecified laterality [J18.9] Patient Active Problem List   Diagnosis Date Noted  . CAP (community acquired pneumonia) 10/30/2019  . Heartburn 08/07/2018  . Neurogenic claudication 10/05/2017  . Preoperative examination 09/26/2017  . H/O falling 05/02/2017  . Elevated serum creatinine 05/02/2017  . Depression 11/12/2016  . Right shoulder injury 05/26/2016  . Pain in joint, shoulder region 04/02/2016  . BPH associated with nocturia 02/17/2016  . Mass in the abdomen 02/09/2016  . Disequilibrium 01/29/2016  . Unsteadiness 01/29/2016  . Family history of prostate cancer 01/07/2016  . Hypothyroidism 01/07/2016  . S/P total knee arthroplasty 12/03/2015  . Routine general medical examination at a health care facility 08/08/2015  . Spinal stenosis in cervical region 02/05/2015  . Cervical spinal stenosis 02/02/2015  . Left shoulder pain 12/06/2014  . Cardiomyopathy, dilated ([place]) 09/16/2014  . Cerebral vascular accident ([place]) 09/16/2014  . Heart valve disease 09/16/2014  . MI (mitral incompetence) 08/08/2014  . TI (tricuspid incompetence) 08/08/2014  . AF (paroxysmal atrial fibrillation) ([place]) 02/15/2014  . Atrial fibrillation, chronic ([place]) 01/16/2014  . Chronic systolic heart failure ([place]) 01/16/2014  . Bunion, right foot 08/17/2013  . Macular degeneration 10/16/2012  . Encounter for Medicare annual wellness exam 08/16/2012  . Hearing decreased 12/16/2010  . Medication adverse effect 07/28/2010  . Prostate cancer screening 07/28/2010  . Long term (current) use of anticoagulants 07/22/2010  . Hyperlipidemia 06/01/2010  . ATRIAL FLUTTER 11/28/2009  . Bipolar disorder ([place]) 02/20/2009   PCP:  Abner Greenspan, MD Pharmacy:   [id number] - [place], [place] [address] Phone: [phone] Fax: [phone]     Social Determinants of Health (SDOH) Interventions    Readmission Risk Interventions No flowsheet data found.

## 2019-10-31 NOTE — Progress Notes (Signed)
PROGRESS NOTE    David Duncan  YQM:[id number] DOB: Aug 07, 1930 DOA: 10/29/2019 PCP: Abner Greenspan, MD   Brief Narrative:  86 year old male with past medical history for atrial fibrillation, hypertension, coronary artery disease, sick sinus syndrome, mitral and tricuspid valve regurgitation who presents to the hospital with generalized weakness, fatigue and vomiting.  His symptoms were worsening, on the day of admission he sustained a mechanical fall without loss of consciousness.  On his initial physical examination his blood pressure was 98/69, heart rate 80, respiratory rate 16, oxygen saturation 99%, temperature 97.6.  His lungs are clear to auscultation bilaterally, heart S1-S2, present rhythmic, his abdomen was soft, no lower extremity edema. Sodium 135, potassium 4.7, chloride 102, bicarb 18, glucose 107, BUN 24, creatinine 1.13, white count 8.2, hemoglobin 14.5, hematocrit 44.1, platelets 178.  SARS COVID-19 negative.  Urine analysis negative for infection.Chest radiograph had left lower lobe interstitial infiltrates.  EKG 91 bpm, normal axis, normal QRS, normal QTC, atrial fibrillation rhythm, no ST segment or T wave changes.  -Patient admitted for treatment of community-acquired pneumonia.  Assessment & Plan:   Principal Problem:   CAP (community acquired pneumonia) Active Problems:   Bipolar disorder ([place])   Long term (current) use of anticoagulants   Chronic systolic heart failure (HCC)   Cardiomyopathy, dilated (HCC)   AF (paroxysmal atrial fibrillation) (H[facility])   Hypothyroidism   1. Community acquired pneumonia, left lower lobe, present on admission. Patient is responding well to the antibiotic therapy, he is feeling better, but not yet back to baseline. Wbc is 8,0, his blood cultures continue with no growth.  Will continue antibiotic therapy with ceftriaxone and azithromycin. Continue oxymetry monitoring. Will add as needed bronchodilator and will request to be out of  bed to chair tid with meals.   Physical and occupation therapy.   Stable for discharge once SNF available.  2. AKI on ckd stage 3b-improved.  Creatinine of 1.8 currently noted with baseline anywhere from 1.5-1.8  No further need for IV fluid.  Monitor repeat BMP in a.m.  3. Paroxysmal atrial fibrillation. Rate controlled with metoprolol XL 100 mg, continue anticoagulation with warfarin per pharmacy protocol. INR 3.2 today.   4. Hypothyroid. Continue with levothyroxine.   5. Chronic systolic heart failure, severe tricuspid and mitral valve regurgitation.   No sign of acute exacerbation, continue with telemetry monitoring.   6. HTN.  Controlled.  Hold home blood pressure agents for now.  7. Bipolar. Continue with venlafaxine. No confusion or agitation.   If patient continue to improve, possible dc home in am.   Status is: Inpatient  Remains inpatient appropriate because:IV treatments appropriate due to intensity of illness or inability to take PO   Dispo: The patient is from: Home  Anticipated d/c is to: SNF  Anticipated d/c date is: 1 day  Patient currently is not medically stable to d/c. Awaiting bed offers.   DVT prophylaxis:      Warfarin   Code Status:              DNR  Family Communication:    Wife at bedside  Consultants:   None  Procedures:   See below  Antimicrobials:  Anti-infectives (From admission, onward)   Start     Dose/Rate Route Frequency Ordered Stop   10/31/19 1000  azithromycin (ZITHROMAX) tablet 500 mg     Discontinue     500 mg Oral Daily 10/30/19 1713     10/31/19 0100  cefTRIAXone (ROCEPHIN) 2 g in  sodium chloride 0.9 % 100 mL IVPB     Discontinue     2 g 200 mL/hr over 30 Minutes Intravenous Every 24 hours 10/30/19 0157 11/05/19 0059   10/31/19 0100  azithromycin (ZITHROMAX) 500 mg in sodium chloride 0.9 % 250 mL IVPB  Status:  Discontinued        500 mg 250 mL/hr over 60 Minutes Intravenous  Every 24 hours 10/30/19 0157 10/30/19 1713   10/30/19 0100  cefTRIAXone (ROCEPHIN) 1 g in sodium chloride 0.9 % 100 mL IVPB        1 g 200 mL/hr over 30 Minutes Intravenous  Once 10/30/19 0053 10/30/19 0231   10/30/19 0100  azithromycin (ZITHROMAX) 500 mg in sodium chloride 0.9 % 250 mL IVPB        500 mg 250 mL/hr over 60 Minutes Intravenous  Once 10/30/19 0053 10/30/19 0405       Subjective: Patient seen and evaluated today with no new acute complaints or concerns. No acute concerns or events noted overnight.  Objective: Vitals:   10/30/19 1600 10/30/19 2333 10/31/19 0806 10/31/19 1628  BP: (!) 118/98 104/83 123/87 119/72  Pulse: 89 73 82 97  Resp: 20  20 20   Temp: 98.2 F (36.8 C) 97.9 F (36.6 C) 98.3 F (36.8 C)   SpO2: 98% 95% 96% 98%    Intake/Output Summary (Last 24 hours) at 10/31/2019 1756 Last data filed at 10/30/2019 2145 Gross per 24 hour  Intake 151.88 ml  Output 200 ml  Net -48.12 ml   There were no vitals filed for this visit.  Examination:  General exam: Appears calm and comfortable  Respiratory system: Clear to auscultation. Respiratory effort normal. Cardiovascular system: S1 & S2 heard, RRR. No JVD, murmurs, rubs, gallops or clicks. No pedal edema. Gastrointestinal system: Abdomen is nondistended, soft and nontender. No organomegaly or masses felt. Normal bowel sounds heard. Central nervous system: Alert and oriented. No focal neurological deficits. Extremities: Symmetric 5 x 5 power. Skin: No rashes, lesions or ulcers Psychiatry: Judgement and insight appear normal. Mood & affect appropriate.     Data Reviewed: I have personally reviewed following labs and imaging studies  CBC: Recent Labs  Lab 10/30/19 0010 10/30/19 0025 10/30/19 0808 10/31/19 0236  WBC 8.2  --  8.0 8.6  NEUTROABS 5.6  --   --  5.2  HGB 14.5 16.0 15.1 12.9*  HCT 44.1 47.0 47.2 38.7*  MCV 103.3*  --  104.0* 101.3*  PLT 178  --  193 932   Basic Metabolic  Panel: Recent Labs  Lab 10/30/19 0010 10/30/19 0025 10/30/19 0808 10/31/19 0236  NA 135 137 136 136  K 4.7 4.2 4.3 4.3  CL 102 100 98 100  CO2 18*  --  27 26  GLUCOSE 107* 107* 99 99  BUN 24* 31* 23 21  CREATININE 1.83* 1.90* 1.77* 1.82*  CALCIUM 10.0  --  10.2 9.3   GFR: CrCl cannot be calculated (Unknown ideal weight.). Liver Function Tests: Recent Labs  Lab 10/30/19 0010  AST 48*  ALT 12  ALKPHOS 75  BILITOT 0.8  PROT 7.8  ALBUMIN 3.8   No results for input(s): LIPASE, AMYLASE in the last 168 hours. No results for input(s): AMMONIA in the last 168 hours. Coagulation Profile: Recent Labs  Lab 10/30/19 0010 10/31/19 0236  INR 2.3* 3.2*   Cardiac Enzymes: No results for input(s): CKTOTAL, CKMB, CKMBINDEX, TROPONINI in the last 168 hours. BNP (last 3 results) No results for  input(s): PROBNP in the last 8760 hours. HbA1C: No results for input(s): HGBA1C in the last 72 hours. CBG: Recent Labs  Lab 10/30/19 0013  GLUCAP 104*   Lipid Profile: No results for input(s): CHOL, HDL, LDLCALC, TRIG, CHOLHDL, LDLDIRECT in the last 72 hours. Thyroid Function Tests: No results for input(s): TSH, T4TOTAL, FREET4, T3FREE, THYROIDAB in the last 72 hours. Anemia Panel: No results for input(s): VITAMINB12, FOLATE, FERRITIN, TIBC, IRON, RETICCTPCT in the last 72 hours. Sepsis Labs: No results for input(s): PROCALCITON, LATICACIDVEN in the last 168 hours.  Recent Results (from the past 240 hour(s))  SARS Coronavirus 2 by RT PCR (hospital order, performed in [facility] hospital lab) Nasopharyngeal Nasopharyngeal Swab     Status: None   Collection Time: 10/30/19 12:10 AM   Specimen: Nasopharyngeal Swab  Result Value Ref Range Status   SARS Coronavirus 2 NEGATIVE NEGATIVE Final    Comment: (NOTE) SARS-CoV-2 target nucleic acids are NOT DETECTED.  The SARS-CoV-2 RNA is generally detectable in upper and lower respiratory specimens during the acute phase of infection. The  lowest concentration of SARS-CoV-2 viral copies this assay can detect is 250 copies / mL. A negative result does not preclude SARS-CoV-2 infection and should not be used as the sole basis for treatment or other patient management decisions.  A negative result may occur with improper specimen collection / handling, submission of specimen other than nasopharyngeal swab, presence of viral mutation(s) within the areas targeted by this assay, and inadequate number of viral copies (<250 copies / mL). A negative result must be combined with clinical observations, patient history, and epidemiological information.  Fact Sheet for Patients:   StrictlyIdeas.no  Fact Sheet for Healthcare Providers: BankingDealers.co.za  This test is not yet approved or  cleared by the [place] FDA and has been authorized for detection and/or diagnosis of SARS-CoV-2 by FDA under an Emergency Use Authorization (EUA).  This EUA will remain in effect (meaning this test can be used) for the duration of the COVID-19 declaration under Section 564(b)(1) of the Act, 21 U.S.C. section 360bbb-3(b)(1), unless the authorization is terminated or revoked sooner.  Performed at [facility], [place] [address] [place], [place] [address]   Blood culture (routine x 2)     Status: None (Preliminary result)   Collection Time: 10/30/19  8:08 AM   Specimen: BLOOD RIGHT HAND  Result Value Ref Range Status   Specimen Description BLOOD RIGHT HAND  Final   Special Requests   Final    BOTTLES DRAWN AEROBIC AND ANAEROBIC Blood Culture results may not be optimal due to an inadequate volume of blood received in culture bottles   Culture   Final    NO GROWTH < 24 HOURS Performed at [facility], [place] [address], [place], [place] [address]    Report Status PENDING  Incomplete         Radiology Studies: CT Head Wo Contrast  Result Date: 10/30/2019 CLINICAL DATA:   Weakness, lightheadedness and nausea. EXAM: CT HEAD WITHOUT CONTRAST TECHNIQUE: Contiguous axial images were obtained from the base of the skull through the vertex without intravenous contrast. COMPARISON:  November 19, 2017 FINDINGS: Brain: There is mild to moderate severity cerebral atrophy with widening of the extra-axial spaces and ventricular dilatation. There are areas of decreased attenuation within the white matter tracts of the supratentorial brain, consistent with microvascular disease changes. 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 Vascular: No  hyperdense vessel or unexpected calcification. Skull: Normal. Negative for fracture or focal lesion. Sinuses/Orbits: A moderate sized air-fluid level is seen within the left maxillary sinus. Other: None. IMPRESSION: 1. Generalized cerebral atrophy. 2. No acute intracranial abnormality. 3. Left maxillary sinus disease. Electronically Signed   By: Virgina Norfolk M.D.   On: 10/30/2019 00:41   DG Chest Portable 1 View  Result Date: 10/30/2019 CLINICAL DATA:  Vomiting, cough EXAM: PORTABLE CHEST 1 VIEW COMPARISON:  None. FINDINGS: The lungs are symmetrically expanded. There is focal pulmonary infiltrate within the left lung base, possibly infectious in the appropriate clinical setting. Right lung is clear. No pneumothorax or pleural effusion. Cardiac size is at the upper limits of normal. Pulmonary vascularity is normal. No acute bone abnormality. IMPRESSION: Left basilar pneumonia. Electronically Signed   By: Fidela Salisbury MD   On: 10/30/2019 00:23        Scheduled Meds: . vitamin C  500 mg Oral Daily  . azithromycin  500 mg Oral Daily  . B-complex with vitamin C  1 tablet Oral Daily  . metoprolol succinate  100 mg Oral Daily  . multivitamin  1 tablet Oral BID  . venlafaxine XR  75 mg Oral Daily  . vitamin B-12  1,000 mcg Oral Daily  . Warfarin - Pharmacist Dosing Inpatient   Does not apply q1600    Continuous Infusions: . cefTRIAXone (ROCEPHIN)  IV 2 g (10/31/19 0046)  . dextrose 5% lactated ringers 50 mL/hr at 10/30/19 1745     LOS: 1 day    Time spent: 30 minutes    Larin Depaoli Darleen Crocker, DO [facility]  If 7PM-7AM, please contact night-coverage [email or web address] 10/31/2019, 5:56 PM

## 2019-10-31 NOTE — Progress Notes (Signed)
VAST consulted to obtain 2nd IV access for abx administration. According to yesterday's physician note pt has CKD 3 with D5LR running at 34ml/hr for gentle rehydration and may be discharged today. Called unit and spoke with pt's nurse regarding fluids and possible dc today. Asked RN to contact physician about changing fluids to D5NS so that 2nd IV access would not be necessary. She verbalized understanding.

## 2019-10-31 NOTE — Evaluation (Signed)
Occupational Therapy Evaluation Patient Details Name: David Duncan MRN: [id number] DOB: January 25, 1931 Today's Date: 10/31/2019    History of Present Illness 86yo male presenting ot the ED with weakness, fatigue, vomiting, and a fall at home. CTH negative for acute changes, CXR shows L lobe pneumonia. PMH A-fib, bipolar disorder, cardiomyopathy, CHF, CVA, anxiety, macular degeneration, valvular and structural heart disease, sick sinus syndrome, lumbar surgery, TKA   Clinical Impression   PTA, pt lives with wife and reports Modified Independence with mobility using RW. Pt receives light assist with ADLs as needed for safety and secondary to limited active R shoulder ROM. Pt presents now with diagnoses above and deficits in strength, endurance, and standing balance impacting ability to complete ADLs/mobility. Pt also with orthostatic BP readings during session (see below). Pt requires grossly Mod A for bed mobility and stand pivot transfer with RW, requiring assistance to maintain balance sitting and standing due to posterior lean (though improved from yesterday). Pt requires Setup to Mod A for UB ADLs and Mod A to Max A for LB ADLs. Pt motivated to work with therapy and improve, engaged in Artist when educated by therapist. Recommend SNF for short term rehab prior to DC home.     Follow Up Recommendations  SNF    Equipment Recommendations  None recommended by OT    Recommendations for Other Services       Precautions / Restrictions Precautions Precautions: Fall;Other (comment) Precaution Comments: posterior LOB, monitor orthostat BP Restrictions Weight Bearing Restrictions: No      Mobility Bed Mobility   Bed Mobility: Supine to Sit     Supine to sit: Mod assist     General bed mobility comments: Mod A to advance trunk with HHA from therapist. assistanced needed to scoot forward while maintaining balance  Transfers Overall transfer level: Needs  assistance Equipment used: Rolling walker (2 wheeled) Transfers: Sit to/from [facility] Sit to Stand: Min assist Stand pivot transfers: Mod assist       General transfer comment: Min A for power up with follow-through of hand placement, Mod A overall for stand pivot with assistance to maintain balance and manuever RW    Balance Overall balance assessment: Needs assistance;History of Falls Sitting-balance support: Single extremity supported;Feet supported Sitting balance-Leahy Scale: Poor Sitting balance - Comments: Min A to Mod A to maintain sitting balance with posterior/L lateral lean LOB throughou Postural control: Posterior lean;Left lateral lean Standing balance support: Bilateral upper extremity supported;During functional activity Standing balance-Leahy Scale: Poor Standing balance comment: Min A to maintain standing balance with posterior lean                           ADL either performed or assessed with clinical judgement   ADL Overall ADL's : Needs assistance/impaired Eating/Feeding: Set up;Sitting   Grooming: Minimal assistance;Standing   Upper Body Bathing: Sitting;Moderate assistance   Lower Body Bathing: Moderate assistance;Sit to/from stand   Upper Body Dressing : Moderate assistance;Sitting   Lower Body Dressing: Maximal assistance;Sit to/from stand Lower Body Dressing Details (indicate cue type and reason): Pt unable to safely don socks sitting EOB due to posterior/L lateral LOB when attempting to cross legs Toilet Transfer: Moderate assistance;Stand-pivot;RW Toilet Transfer Details (indicate cue type and reason): simulated to recliner. Mod A for advancing RW and maintaining balance with improved posterior lean today. Pt focusing on following instructions to lean forward to correct balance Toileting- Clothing Manipulation and Hygiene: Maximal assistance;Sit  to/from stand         General ADL Comments: Pt limited by decreased  balance (posterior lean but improving today), decreased endurance, and decreased strength. Pt also limited in ADL independence due to limited R shoulder ROM though this is baseline for pt     Vision Baseline Vision/History: Wears glasses Wears Glasses: Reading only Patient Visual Report: Other (comment);Central vision impairment (pt reports macular degeneration) Vision Assessment?: No apparent visual deficits (macular degeneration but functional vision)     Perception     Praxis      Pertinent Vitals/Pain Pain Assessment: No/denies pain     Hand Dominance Right   Extremity/Trunk Assessment Upper Extremity Assessment Upper Extremity Assessment: RUE deficits/detail;Generalized weakness RUE Deficits / Details: very limited active shoulder flexin (30*), PROM to about 85* RUE Sensation: WNL RUE Coordination: WNL   Lower Extremity Assessment Lower Extremity Assessment: Defer to PT evaluation   Cervical / Trunk Assessment Cervical / Trunk Assessment: Kyphotic Cervical / Trunk Exceptions: scoliotic   Communication Communication Communication: No difficulties   Cognition Arousal/Alertness: Awake/alert Behavior During Therapy: WFL for tasks assessed/performed Overall Cognitive Status: No family/caregiver present to determine baseline cognitive functioning Area of Impairment: Safety/judgement;Awareness;Problem solving                         Safety/Judgement: Decreased awareness of safety;Decreased awareness of deficits Awareness: Emergent Problem Solving: Slow processing;Requires verbal cues General Comments: Pt A&Ox4 today, pleasant with appropriate responses. Does require cues for safe sequencing and correcting balance but able to follow instructions well and asks for input on technique   General Comments  Pt reporting dizziness as cause of fall at home previously. Assessed BP in lying at 108/78, 83/52 after initial sitting EOB. After ankle pumps and sitting EOB > 2  minutes, BP at 101/55. After transfer to chair, BP at 97/62. Pt endorsed mild dizziness. HR WFL up to 105bpm with activity    Exercises     Shoulder Instructions      Home Living Family/patient expects to be discharged to:: Private residence Living Arrangements: Spouse/significant other Available Help at Discharge: Family;Available PRN/intermittently Type of Home: House Home Access: Stairs to enter [employer] of Steps: 1 Entrance Stairs-Rails: Can reach both Home Layout: One level     Bathroom Shower/Tub: Walk-in shower;Other (comment);Door   [employer] Toilet: Standard     Home Equipment: Shower seat - built in;Walker - 2 wheels;Cane - single point;Bedside commode;Grab bars - tub/shower          Prior Functioning/Environment Level of Independence: Independent with assistive device(s);Needs assistance  Gait / Transfers Assistance Needed: Pt able to mobilize with RW in the home.  ADL's / Homemaking Assistance Needed: Wife assists with shower transfer, assists lightly with showering and UB dressing as needed. WIfe completes IADLs in the home   Comments: pt reports 2 falls this year, one due to dizziness        OT Problem List: Decreased strength;Decreased activity tolerance;Impaired balance (sitting and/or standing);Decreased range of motion;Decreased safety awareness;Decreased knowledge of use of DME or AE      OT Treatment/Interventions: Self-care/ADL training;Therapeutic exercise;Energy conservation;DME and/or AE instruction;Therapeutic activities;Patient/family education    OT Goals(Current goals can be found in the care plan section) Acute Rehab OT Goals Patient Stated Goal: go to rehab and improve OT Goal Formulation: With patient Time For Goal Achievement: 11/14/19 Potential to Achieve Goals: Good ADL Goals Pt Will Perform Grooming: with min guard assist;standing Pt Will Perform Lower  Body Bathing: with min assist;sit to/from stand Pt Will Perform  Lower Body Dressing: with min assist;sit to/from stand Pt Will Transfer to Toilet: with min guard assist;stand pivot transfer;bedside commode Pt Will Perform Toileting - Clothing Manipulation and hygiene: with min assist;sit to/from stand  OT Frequency: Min 2X/week   Barriers to D/C:            Co-evaluation              AM-PAC OT "6 Clicks" Daily Activity     Outcome Measure Help from another person eating meals?: A Little Help from another person taking care of personal grooming?: A Little Help from another person toileting, which includes using toliet, bedpan, or urinal?: A Lot Help from another person bathing (including washing, rinsing, drying)?: A Lot Help from another person to put on and taking off regular upper body clothing?: A Lot Help from another person to put on and taking off regular lower body clothing?: A Lot 6 Click Score: 14   End of Session Equipment Utilized During Treatment: Gait belt;Rolling walker Nurse Communication: Mobility status (RN present at end of session )  Activity Tolerance: Patient tolerated treatment well Patient left: in chair;with call bell/phone within reach;with chair alarm set  OT Visit Diagnosis: Unsteadiness on feet (R26.81);Other abnormalities of gait and mobility (R26.89);Muscle weakness (generalized) (M62.81);History of falling (Z91.81)                Time: 5284-1324 OT Time Calculation (min): 37 min Charges:  OT General Charges $OT Visit: 1 Visit OT Evaluation $OT Eval Moderate Complexity: 1 Mod OT Treatments $Therapeutic Activity: 8-22 mins  Layla Maw, OTR/L  Layla Maw 10/31/2019, 10:30 AM

## 2019-10-31 NOTE — NC FL2 (Signed)
[place] LEVEL OF CARE SCREENING TOOL     IDENTIFICATION  Patient Name: David Duncan Birthdate: 1930/11/06 Sex: male Admission Date (Current Location): 10/29/2019  [facility] and [place] Number:  Herbalist and Address:  [facility] [place]. [facility], [place] [address] [place], [place] [address]      Provider Number: [id number]  Attending Physician Name and Address:  Rodena Goldmann, DO  Relative Name and Phone Number:  [name], spouse, [phone]    Current Level of Care: Hospital Recommended Level of Care: [place] Prior Approval Number:    Date Approved/Denied:   PASRR Number: [id number]  Discharge Plan: SNF    Current Diagnoses: Patient Active Problem List   Diagnosis Date Noted  . CAP (community acquired pneumonia) 10/30/2019  . Heartburn 08/07/2018  . Neurogenic claudication 10/05/2017  . Preoperative examination 09/26/2017  . H/O falling 05/02/2017  . Elevated serum creatinine 05/02/2017  . Depression 11/12/2016  . Right shoulder injury 05/26/2016  . Pain in joint, shoulder region 04/02/2016  . BPH associated with nocturia 02/17/2016  . Mass in the abdomen 02/09/2016  . Disequilibrium 01/29/2016  . Unsteadiness 01/29/2016  . Family history of prostate cancer 01/07/2016  . Hypothyroidism 01/07/2016  . S/P total knee arthroplasty 12/03/2015  . Routine general medical examination at a health care facility 08/08/2015  . Spinal stenosis in cervical region 02/05/2015  . Cervical spinal stenosis 02/02/2015  . Left shoulder pain 12/06/2014  . Cardiomyopathy, dilated ([place]) 09/16/2014  . Cerebral vascular accident ([place]) 09/16/2014  . Heart valve disease 09/16/2014  . MI (mitral incompetence) 08/08/2014  . TI (tricuspid incompetence) 08/08/2014  . AF (paroxysmal atrial fibrillation) ([place]) 02/15/2014  . Atrial fibrillation, chronic ([place]) 01/16/2014  . Chronic systolic heart failure ([place]) 01/16/2014  . Bunion, right  foot 08/17/2013  . Macular degeneration 10/16/2012  . Encounter for Medicare annual wellness exam 08/16/2012  . Hearing decreased 12/16/2010  . Medication adverse effect 07/28/2010  . Prostate cancer screening 07/28/2010  . Long term (current) use of anticoagulants 07/22/2010  . Hyperlipidemia 06/01/2010  . ATRIAL FLUTTER 11/28/2009  . Bipolar disorder ([place]) 02/20/2009    Orientation RESPIRATION BLADDER Height & Weight     Self, Time, Situation, Place  Normal Continent Weight:   Height:     BEHAVIORAL SYMPTOMS/MOOD NEUROLOGICAL BOWEL NUTRITION STATUS      Continent Diet (Please see DC Summary)  AMBULATORY STATUS COMMUNICATION OF NEEDS Skin   Extensive Assist Verbally Normal                       Personal Care Assistance Level of Assistance  Bathing, Feeding, Dressing Bathing Assistance: Limited assistance Feeding assistance: Independent Dressing Assistance: Limited assistance     Functional Limitations Info  Sight, Hearing Sight Info: Impaired Hearing Info: Impaired      SPECIAL CARE FACTORS FREQUENCY  PT (By licensed PT), OT (By licensed OT)     PT Frequency: 5x/week OT Frequency: 5x/week            Contractures Contractures Info: Not present    Additional Factors Info  Code Status, Allergies, Psychotropic Code Status Info: DNR Allergies Info: Baclofen Psychotropic Info: Effexor         Current Medications (10/31/2019):  This is the current hospital active medication list Current Facility-Administered Medications  Medication Dose Route Frequency Provider Last Rate Last Admin  . ascorbic acid (VITAMIN C) tablet 500 mg  500 mg Oral Daily Elgergawy, Dawood  S, MD   500 mg at 10/31/19 1003  . azithromycin (ZITHROMAX) tablet 500 mg  500 mg Oral Daily Arrien, Jimmy Picket, MD   500 mg at 10/31/19 1003  . B-complex with vitamin C tablet 1 tablet  1 tablet Oral Daily Elgergawy, Silver Huguenin, MD   1 tablet at 10/31/19 1003  . cefTRIAXone (ROCEPHIN) 2 g in  sodium chloride 0.9 % 100 mL IVPB  2 g Intravenous Q24H Elgergawy, Silver Huguenin, MD 200 mL/hr at 10/31/19 0046 2 g at 10/31/19 0046  . dextrose 5 % in lactated ringers infusion   Intravenous Continuous Arrien, Jimmy Picket, MD 50 mL/hr at 10/30/19 1745 New Bag at 10/30/19 1745  . metoprolol succinate (TOPROL-XL) 24 hr tablet 100 mg  100 mg Oral Daily Elgergawy, Silver Huguenin, MD   100 mg at 10/31/19 1003  . multivitamin (PROSIGHT) tablet 1 tablet  1 tablet Oral BID Elgergawy, Silver Huguenin, MD   1 tablet at 10/31/19 1003  . venlafaxine XR (EFFEXOR-XR) 24 hr capsule 75 mg  75 mg Oral Daily Elgergawy, Silver Huguenin, MD   75 mg at 10/31/19 1003  . vitamin B-12 (CYANOCOBALAMIN) tablet 1,000 mcg  1,000 mcg Oral Daily Elgergawy, Silver Huguenin, MD   1,000 mcg at 10/31/19 1004  . warfarin (COUMADIN) tablet 2 mg  2 mg Oral q1600 Franky Macho, RPH   2 mg at 10/30/19 1640  . Warfarin - Pharmacist Dosing Inpatient   Does not apply [place], [facility]         Discharge Medications: Please see discharge summary for a list of discharge medications.  Relevant Imaging Results:  Relevant Lab Results:   Additional Information SSN [id number]  Benard Halsted, LCSW

## 2019-11-01 DIAGNOSIS — I429 Cardiomyopathy, unspecified: Secondary | ICD-10-CM | POA: Diagnosis not present

## 2019-11-01 DIAGNOSIS — I4891 Unspecified atrial fibrillation: Secondary | ICD-10-CM | POA: Diagnosis not present

## 2019-11-01 DIAGNOSIS — D649 Anemia, unspecified: Secondary | ICD-10-CM | POA: Diagnosis not present

## 2019-11-01 DIAGNOSIS — H3530 Unspecified macular degeneration: Secondary | ICD-10-CM | POA: Diagnosis not present

## 2019-11-01 DIAGNOSIS — M2550 Pain in unspecified joint: Secondary | ICD-10-CM | POA: Diagnosis not present

## 2019-11-01 DIAGNOSIS — J189 Pneumonia, unspecified organism: Secondary | ICD-10-CM | POA: Diagnosis not present

## 2019-11-01 DIAGNOSIS — I480 Paroxysmal atrial fibrillation: Secondary | ICD-10-CM | POA: Diagnosis not present

## 2019-11-01 DIAGNOSIS — R791 Abnormal coagulation profile: Secondary | ICD-10-CM | POA: Diagnosis not present

## 2019-11-01 DIAGNOSIS — Z79899 Other long term (current) drug therapy: Secondary | ICD-10-CM | POA: Diagnosis not present

## 2019-11-01 DIAGNOSIS — I420 Dilated cardiomyopathy: Secondary | ICD-10-CM | POA: Diagnosis not present

## 2019-11-01 DIAGNOSIS — Z7189 Other specified counseling: Secondary | ICD-10-CM | POA: Diagnosis not present

## 2019-11-01 DIAGNOSIS — Z9981 Dependence on supplemental oxygen: Secondary | ICD-10-CM | POA: Diagnosis not present

## 2019-11-01 DIAGNOSIS — E039 Hypothyroidism, unspecified: Secondary | ICD-10-CM | POA: Diagnosis not present

## 2019-11-01 DIAGNOSIS — R0689 Other abnormalities of breathing: Secondary | ICD-10-CM | POA: Diagnosis not present

## 2019-11-01 DIAGNOSIS — J9621 Acute and chronic respiratory failure with hypoxia: Secondary | ICD-10-CM | POA: Diagnosis not present

## 2019-11-01 DIAGNOSIS — R531 Weakness: Secondary | ICD-10-CM | POA: Diagnosis not present

## 2019-11-01 DIAGNOSIS — I517 Cardiomegaly: Secondary | ICD-10-CM | POA: Diagnosis not present

## 2019-11-01 DIAGNOSIS — Z7401 Bed confinement status: Secondary | ICD-10-CM | POA: Diagnosis not present

## 2019-11-01 DIAGNOSIS — E785 Hyperlipidemia, unspecified: Secondary | ICD-10-CM | POA: Diagnosis not present

## 2019-11-01 DIAGNOSIS — I340 Nonrheumatic mitral (valve) insufficiency: Secondary | ICD-10-CM | POA: Diagnosis not present

## 2019-11-01 DIAGNOSIS — N390 Urinary tract infection, site not specified: Secondary | ICD-10-CM | POA: Diagnosis not present

## 2019-11-01 DIAGNOSIS — Y95 Nosocomial condition: Secondary | ICD-10-CM | POA: Diagnosis present

## 2019-11-01 DIAGNOSIS — N1832 Chronic kidney disease, stage 3b: Secondary | ICD-10-CM | POA: Diagnosis not present

## 2019-11-01 DIAGNOSIS — I5022 Chronic systolic (congestive) heart failure: Secondary | ICD-10-CM | POA: Diagnosis not present

## 2019-11-01 DIAGNOSIS — E569 Vitamin deficiency, unspecified: Secondary | ICD-10-CM | POA: Diagnosis not present

## 2019-11-01 DIAGNOSIS — F411 Generalized anxiety disorder: Secondary | ICD-10-CM | POA: Diagnosis not present

## 2019-11-01 DIAGNOSIS — R404 Transient alteration of awareness: Secondary | ICD-10-CM | POA: Diagnosis not present

## 2019-11-01 DIAGNOSIS — I5023 Acute on chronic systolic (congestive) heart failure: Secondary | ICD-10-CM | POA: Diagnosis not present

## 2019-11-01 DIAGNOSIS — M6281 Muscle weakness (generalized): Secondary | ICD-10-CM | POA: Diagnosis not present

## 2019-11-01 DIAGNOSIS — I10 Essential (primary) hypertension: Secondary | ICD-10-CM | POA: Diagnosis not present

## 2019-11-01 DIAGNOSIS — R296 Repeated falls: Secondary | ICD-10-CM | POA: Diagnosis not present

## 2019-11-01 DIAGNOSIS — Z8673 Personal history of transient ischemic attack (TIA), and cerebral infarction without residual deficits: Secondary | ICD-10-CM | POA: Diagnosis not present

## 2019-11-01 DIAGNOSIS — Z7901 Long term (current) use of anticoagulants: Secondary | ICD-10-CM | POA: Diagnosis not present

## 2019-11-01 DIAGNOSIS — R945 Abnormal results of liver function studies: Secondary | ICD-10-CM | POA: Diagnosis not present

## 2019-11-01 DIAGNOSIS — R498 Other voice and resonance disorders: Secondary | ICD-10-CM | POA: Diagnosis not present

## 2019-11-01 DIAGNOSIS — R6520 Severe sepsis without septic shock: Secondary | ICD-10-CM | POA: Diagnosis not present

## 2019-11-01 DIAGNOSIS — A419 Sepsis, unspecified organism: Secondary | ICD-10-CM | POA: Diagnosis not present

## 2019-11-01 DIAGNOSIS — Z20822 Contact with and (suspected) exposure to covid-19: Secondary | ICD-10-CM | POA: Diagnosis not present

## 2019-11-01 DIAGNOSIS — R0602 Shortness of breath: Secondary | ICD-10-CM | POA: Diagnosis not present

## 2019-11-01 DIAGNOSIS — K219 Gastro-esophageal reflux disease without esophagitis: Secondary | ICD-10-CM | POA: Diagnosis not present

## 2019-11-01 DIAGNOSIS — F3289 Other specified depressive episodes: Secondary | ICD-10-CM | POA: Diagnosis not present

## 2019-11-01 DIAGNOSIS — G9341 Metabolic encephalopathy: Secondary | ICD-10-CM | POA: Diagnosis not present

## 2019-11-01 DIAGNOSIS — E871 Hypo-osmolality and hyponatremia: Secondary | ICD-10-CM | POA: Diagnosis not present

## 2019-11-01 DIAGNOSIS — N400 Enlarged prostate without lower urinary tract symptoms: Secondary | ICD-10-CM | POA: Diagnosis not present

## 2019-11-01 DIAGNOSIS — R0902 Hypoxemia: Secondary | ICD-10-CM | POA: Diagnosis not present

## 2019-11-01 DIAGNOSIS — Z515 Encounter for palliative care: Secondary | ICD-10-CM | POA: Diagnosis not present

## 2019-11-01 DIAGNOSIS — F319 Bipolar disorder, unspecified: Secondary | ICD-10-CM | POA: Diagnosis not present

## 2019-11-01 DIAGNOSIS — R488 Other symbolic dysfunctions: Secondary | ICD-10-CM | POA: Diagnosis not present

## 2019-11-01 DIAGNOSIS — I482 Chronic atrial fibrillation, unspecified: Secondary | ICD-10-CM | POA: Diagnosis not present

## 2019-11-01 DIAGNOSIS — N189 Chronic kidney disease, unspecified: Secondary | ICD-10-CM | POA: Diagnosis not present

## 2019-11-01 DIAGNOSIS — R069 Unspecified abnormalities of breathing: Secondary | ICD-10-CM | POA: Diagnosis not present

## 2019-11-01 DIAGNOSIS — Z66 Do not resuscitate: Secondary | ICD-10-CM | POA: Diagnosis not present

## 2019-11-01 DIAGNOSIS — I4819 Other persistent atrial fibrillation: Secondary | ICD-10-CM | POA: Diagnosis not present

## 2019-11-01 LAB — BASIC METABOLIC PANEL
Anion gap: 9 (ref 5–15)
BUN: 19 mg/dL (ref 8–23)
CO2: 25 mmol/L (ref 22–32)
Calcium: 9.1 mg/dL (ref 8.9–10.3)
Chloride: 102 mmol/L (ref 98–111)
Creatinine, Ser: 1.91 mg/dL — ABNORMAL HIGH (ref 0.61–1.24)
GFR calc Af Amer: 36 mL/min — ABNORMAL LOW (ref >60)
GFR calc non Af Amer: 31 mL/min — ABNORMAL LOW (ref >60)
Glucose, Bld: 104 mg/dL — ABNORMAL HIGH (ref 70–99)
Potassium: 5 mmol/L (ref 3.5–5.1)
Sodium: 136 mmol/L (ref 135–145)

## 2019-11-01 LAB — PROTIME-INR
INR: 2.3 — ABNORMAL HIGH (ref 0.8–1.2)
Prothrombin Time: 24.6 seconds — ABNORMAL HIGH (ref 11.4–15.2)

## 2019-11-01 LAB — CBC
HCT: 38.6 % — ABNORMAL LOW (ref 39.0–52.0)
Hemoglobin: 12.5 g/dL — ABNORMAL LOW (ref 13.0–17.0)
MCH: 33.2 pg (ref 26.0–34.0)
MCHC: 32.4 g/dL (ref 30.0–36.0)
MCV: 102.7 fL — ABNORMAL HIGH (ref 80.0–100.0)
Platelets: 147 10*3/uL — ABNORMAL LOW (ref 150–400)
RBC: 3.76 MIL/uL — ABNORMAL LOW (ref 4.22–5.81)
RDW: 14.2 % (ref 11.5–15.5)
WBC: 8.7 10*3/uL (ref 4.0–10.5)
nRBC: 0 % (ref 0.0–0.2)

## 2019-11-01 MED ORDER — LACTATED RINGERS IV SOLN: INTRAVENOUS | Status: DC

## 2019-11-01 MED ORDER — CEFDINIR 300 MG PO CAPS
300.0000 mg | ORAL_CAPSULE | Freq: Two times a day (BID) | ORAL | 0 refills | Status: AC
Start: 2019-11-01 — End: 2019-11-04

## 2019-11-01 MED ORDER — AZITHROMYCIN 250 MG PO TABS: 250.0000 mg | ORAL_TABLET | Freq: Every day | ORAL | 0 refills | Status: AC

## 2019-11-01 MED ORDER — ONDANSETRON HCL 4 MG/2ML IJ SOLN
4.0000 mg | Freq: Once | INTRAMUSCULAR | Status: AC
  Administered 2019-11-01: 4 mg via INTRAVENOUS
  Filled 2019-11-01: qty 2

## 2019-11-01 MED ORDER — WARFARIN SODIUM 2 MG PO TABS: 2.0000 mg | ORAL_TABLET | Freq: Once | ORAL | Status: DC

## 2019-11-01 NOTE — TOC Progression Note (Signed)
Transition of Care [facility]) - Progression Note    Patient Details  Name: David Duncan MRN: [id number] Date of Birth: 01/22/1931  Transition of Care [facility]) CM/SW [place], LCSW Phone Number: 11/01/2019, 10:39 AM  Clinical Narrative:    CSW checked again with [facility] Admissions to make sure a bed did not open up. Unfortunately it has not. Patient's spouse reviewed bed offers yesterday and selected [facility]. Insurance approval received: [id number] for 7 days. Ambulance transport also approved [id number]. Patient's spouse will meet patient at [facility] later today.    Expected Discharge Plan: [place] Barriers to Discharge: Barriers Resolved  Expected Discharge Plan and Services Expected Discharge Plan: [place] In-house Referral: Clinical Social Work   Post Acute Care Choice: [place] Living arrangements for the past 2 months: Single Family Home                                       Social Determinants of Health (SDOH) Interventions    Readmission Risk Interventions No flowsheet data found.

## 2019-11-01 NOTE — TOC Transition Note (Signed)
Transition of Care [facility]) - CM/SW Discharge Note   Patient Details  Name: David Duncan MRN: [id number] Date of Birth: 1931-03-29  Transition of [facility]) CM/SW Contact:  Benard Halsted, LCSW Phone Number: 11/01/2019, 12:06 PM   Clinical Narrative:    Patient will DC to: [facility] [place] Anticipated DC date: 11/01/19 Family notified: Spouse Transport by: [facility] 2pm. Please contact spouse when they arrive.   Per MD patient ready for DC to [facility]. RN, patient, patient's family, and facility notified of DC. Discharge Summary and FL2 sent to facility. RN to call report prior to discharge [phone]). DC packet on chart. Ambulance transport requested for patient.   CSW will sign off for now as social work intervention is no longer needed. Please consult [place] again if new needs arise.      Final next level of care: Skilled Nursing Facility Barriers to Discharge: Barriers Resolved   Patient Goals and CMS Choice Patient states their goals for this hospitalization and ongoing recovery are:: Rehab CMS Medicare.gov Compare Post Acute Care list provided to:: Patient Choice offered to / list presented to : Patient  Discharge Placement   Existing PASRR number confirmed : 11/01/19          Patient chooses bed at: [facility] [place] Patient to be transferred to facility by: [place] Name of family member notified: Spouse Patient and family notified of of transfer: 11/01/19  Discharge Plan and Services In-house Referral: Clinical Social Work   Post Acute Care Choice: [place]                               Social Determinants of Health (SDOH) Interventions     Readmission Risk Interventions No flowsheet data found.

## 2019-11-01 NOTE — Progress Notes (Signed)
ANTICOAGULATION CONSULT NOTE - Initial Consult  Pharmacy Consult for Warfarin Indication: atrial fibrillation  Allergies  Allergen Reactions  . Baclofen     Confusion/delerium    Vital Signs: Temp: 97.9 F (36.6 C) (07/22 0759) BP: 125/55 (07/22 0759) Pulse Rate: 84 (07/22 0759)  Labs: Recent Labs    10/30/19 0010 10/30/19 0025 10/30/19 0808 10/30/19 0808 10/31/19 0236 11/01/19 0311  HGB 14.5   < > 15.1   < > 12.9* 12.5*  HCT 44.1   < > 47.2  --  38.7* 38.6*  PLT 178   < > 193  --  170 147*  LABPROT 24.1*  --   --   --  32.0* 24.6*  INR 2.3*  --   --   --  3.2* 2.3*  CREATININE 1.83*   < > 1.77*  --  1.82* 1.91*  TROPONINIHS 4  --   --   --   --   --    < > = values in this interval not displayed.    CrCl cannot be calculated (Unknown ideal weight.).   Medical History: Past Medical History:  Diagnosis Date  . Anxiety   . Arrhythmia   . Atrial fibrillation ([place])   . Basal cell cancer 2008   on shoulder  . Bipolar disorder [facility])    hospitalized in past  . Cardiomyopathy ([place])   . CHF (congestive heart failure) ([place])   . CVA (cerebral vascular accident) ([place]) 8/11  . Depression   . Dyslipidemia   . Generalized anxiety disorder   . GERD (gastroesophageal reflux disease)   . History of kidney stones    30 years ago  . Hypothyroidism   . Macular degeneration   . Melanoma ([place])    resected from scalp approximately 1 year ago.   . Osteoarthritis    in neck and left knee  . Pneumonia 10/30/2019  . Tremor    noted when writing  . Valvular heart disease     Assessment: 86 y.o. M presents with weakness and nausea. The patient was on Warfarin PTA for hx Afib and CVA. Pharmacy consulted to dose this admission.  INR today is therapeutic after dose held yesterday due to jump in INR (INR 2.3 << 3.2, goal of 2-3), Hgb/Hct stable, plts 147. No bleeding noted. Will resume dosing today.   Goal of Therapy:  INR 2-3 Monitor platelets by anticoagulation protocol:  Yes   Plan:  - Warfarin 2 mg x 1 dose at 1600 today - Daily PT/INR, CBC q72h - Will continue to monitor for any signs/symptoms of bleeding and will follow up with PT/INR in the a.m.    Thank you for allowing pharmacy to be a part of this patient's care.  Alycia Rossetti, PharmD, BCPS Clinical Pharmacist Clinical phone for 11/01/2019: [phone] 11/01/2019 10:15 AM   **Pharmacist phone directory can now be found on [email or web address] ([email or web address]).  Listed under [place].

## 2019-11-01 NOTE — Discharge Summary (Signed)
Physician Discharge Summary  David Duncan FHL:[id number] DOB: June 13, 1930 DOA: 10/29/2019  PCP: Abner Greenspan, MD  Admit date: 10/29/2019  Discharge date: 11/01/2019  Admitted From:Home  Disposition:  SNF  Recommendations for Outpatient Follow-up:  1. Follow up with PCP in 1-2 weeks 2. Please obtain BMP in one week and resume Entresto as well as Lasix if creatinine remains stable.  Noted to be 1.9 on day of discharge which I believe reflects advancement of CKD as opposed to AKI.  Patient has remained between 1.7-and 1.9 during this admission. 3. Continue on Omnicef and azithromycin as prescribed for 3 more days to complete a total 5-day course of treatment for community-acquired pneumonia. 4. Continue on other medications as prior.  Home Health: None  Equipment/Devices: None  Discharge Condition: Stable  CODE STATUS: DNR  Diet recommendation: Heart Healthy  Brief/Interim Summary: 86 year old male with past medical history for atrial fibrillation, hypertension, coronary artery disease, sick sinus syndrome, mitral and tricuspid valve regurgitationwho presents to the hospital with generalized weakness, fatigue and vomiting. His symptoms were worsening, on the day of admission he sustained a mechanical fall without loss of consciousness. On his initial physical examination his blood pressure was 98/69, heart rate 80, respiratory rate 16, oxygen saturation 99%, temperature 97.6. His lungs are clear to auscultation bilaterally, heart S1-S2, present rhythmic, his abdomen was soft, no lower extremity edema. Sodium 135, potassium 4.7, chloride 102, bicarb 18, glucose 107, BUN 24, creatinine 1.13, white count 8.2, hemoglobin 14.5, hematocrit 44.1, platelets 178. SARS COVID-19 negative. Urine analysis negative for infection.Chest radiograph had left lower lobe interstitial infiltrates.EKG 91 bpm, normal axis, normal QRS, normal QTC, atrial fibrillation rhythm, no ST segment or T wave  changes.  1. Community acquired pneumonia, left lower lobe, present on admission.Patient is responding well to the antibiotic therapy, he is feeling better, but not yet back to baseline. Wbc is 8,0, his blood cultures continue with no growth.  -Continue oral azithromycin as well as Omnicef for 3 more days for total 5-day course of treatment to complete course.  2. AKI on ckd stage 3b-improved.   Versus progression of CKD.  Creatinine currently 1.9.  Recheck in 1 week and consider resumption of Lasix and Entresto should levels remain stable at that time.  3. Paroxysmal atrial fibrillation. Rate controlled with metoprolol XL 100 mg, continue anticoagulation with warfarin per pharmacy protocol. INR 2.3 today.   4. Hypothyroid. Continue with levothyroxine.   5. Chronic systolic heart failure, severe tricuspid and mitral valve regurgitation.No sign of acute exacerbation, continue with telemetry monitoring.   6. HTN.  Controlled.    Resume home metoprolol and hold Entresto as well as Lasix.  7. Bipolar.Continue with venlafaxine. No confusion or agitation.  Discharge Diagnoses:  Principal Problem:   CAP (community acquired pneumonia) Active Problems:   Bipolar disorder ([name])   Long term (current) use of anticoagulants   Chronic systolic heart failure (HCC)   Cardiomyopathy, dilated (HCC)   AF (paroxysmal atrial fibrillation) ([place])   Hypothyroidism    Discharge Instructions  Discharge Instructions    Diet - low sodium heart healthy   Complete by: As directed    Increase activity slowly   Complete by: As directed      Allergies as of 11/01/2019      Reactions   Baclofen    Confusion/delerium      Medication List    STOP taking these medications   furosemide 20 MG tablet Commonly known as: LASIX   loperamide  2 MG capsule Commonly known as: IMODIUM   OVER THE COUNTER MEDICATION   OVER THE COUNTER MEDICATION   OVER THE COUNTER MEDICATION    sacubitril-valsartan 24-26 MG Commonly known as: ENTRESTO     TAKE these medications   acetaminophen 500 MG tablet Commonly known as: TYLENOL Take 500-1,000 mg by mouth every 6 (six) hours as needed for mild pain.   azithromycin 250 MG tablet Commonly known as: ZITHROMAX Take 1 tablet (250 mg total) by mouth daily for 3 days.   B COMPLEX PO Take 1 tablet by mouth daily.   cefdinir 300 MG capsule Commonly known as: OMNICEF Take 1 capsule (300 mg total) by mouth 2 (two) times daily for 3 days.   escitalopram 10 MG tablet Commonly known as: LEXAPRO Take 1 tablet (10 mg total) by mouth daily.   FREE FORM AMINO ACID COMPLEX PO Take 1 capsule by mouth 2 (two) times daily.   Lysine 500 MG Caps Take 1 capsule by mouth 2 (two) times daily.   MELATONIN PO Take 2 tablets by mouth at bedtime. Restful Sleep by [facility] : Hops Extract 4:1 (Humulus lupulus) (flower) Chamomile (Matricaria chamomilla) (flower) Passion Flower Extract (standardized to 4% vitexin) (Passiflora incarnate) (flower) 5 HTP (5-Hydroxtryptophan) (Griffonia simplicifolia) (seed)   METHYLFOLATE PO Take 1 tablet by mouth daily.   metoprolol succinate 100 MG 24 hr tablet Commonly known as: TOPROL-XL Take 1 tablet (100 mg total) by mouth daily. Take with or immediately following a meal.   polyethylene glycol 17 g packet Commonly known as: MIRALAX / GLYCOLAX Take 17 g by mouth daily.   PreserVision AREDS 2 Caps Take 1 capsule by mouth 2 (two) times daily.   Prevagen 10 MG Caps Generic drug: Apoaequorin Take 1 capsule by mouth daily.   PROSTATE PO Take 1 tablet by mouth daily.   venlafaxine XR 75 MG 24 hr capsule Commonly known as: EFFEXOR-XR Take 75 mg by mouth every morning.   Vitamin B-12 1000 MCG Subl Place 1 tablet under the tongue daily.   vitamin C 500 MG tablet Commonly known as: ASCORBIC ACID Take 500 mg by mouth daily.   warfarin 3 MG tablet Commonly known as: COUMADIN Take 1 tablet (3 mg  total) by mouth daily.   warfarin 2 MG tablet Commonly known as: COUMADIN Take 2 mg by mouth daily.       Follow-up Information    Tower, Wynelle Fanny, MD Follow up in 1 week(s).   Specialties: Family Medicine, Radiology Contact information: [place] [address] [phone]              Allergies  Allergen Reactions  . Baclofen     Confusion/delerium    Consultations:  None   Procedures/Studies: CT Head Wo Contrast  Result Date: 10/30/2019 CLINICAL DATA:  Weakness, lightheadedness and nausea. EXAM: CT HEAD WITHOUT CONTRAST TECHNIQUE: Contiguous axial images were obtained from the base of the skull through the vertex without intravenous contrast. COMPARISON:  November 19, 2017 FINDINGS: Brain: There is mild to moderate severity cerebral atrophy with widening of the extra-axial spaces and ventricular dilatation. There are areas of decreased attenuation within the white matter tracts of the supratentorial brain, consistent with microvascular disease changes. 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 Vascular: No hyperdense vessel or unexpected calcification. Skull: Normal. Negative for fracture or focal lesion. Sinuses/Orbits: A moderate sized air-fluid level is seen within the left maxillary sinus. Other:  None. IMPRESSION: 1. Generalized cerebral atrophy. 2. No acute intracranial abnormality. 3. Left maxillary sinus disease. Electronically Signed   By: Virgina Norfolk M.D.   On: 10/30/2019 00:41   DG Chest Portable 1 View  Result Date: 10/30/2019 CLINICAL DATA:  Vomiting, cough EXAM: PORTABLE CHEST 1 VIEW COMPARISON:  None. FINDINGS: The lungs are symmetrically expanded. There is focal pulmonary infiltrate within the left lung base, possibly infectious in the appropriate clinical setting. Right lung is clear. No pneumothorax or pleural effusion. Cardiac size is at the upper limits of normal. Pulmonary vascularity is  normal. No acute bone abnormality. IMPRESSION: Left basilar pneumonia. Electronically Signed   By: Fidela Salisbury MD   On: 10/30/2019 00:23      Discharge Exam: Vitals:   10/31/19 2028 11/01/19 0759  BP: 123/80 (!) 125/55  Pulse: 72 84  Resp: 20 18  Temp: 98.5 F (36.9 C) 97.9 F (36.6 C)  SpO2: 98% 100%   Vitals:   10/31/19 0806 10/31/19 1628 10/31/19 2028 11/01/19 0759  BP: 123/87 119/72 123/80 (!) 125/55  Pulse: 82 97 72 84  Resp: 20 20 20 18   Temp: 98.3 F (36.8 C)  98.5 F (36.9 C) 97.9 F (36.6 C)  TempSrc:   Oral   SpO2: 96% 98% 98% 100%    General: Pt is alert, awake, not in acute distress Cardiovascular: RRR, S1/S2 +, no rubs, no gallops Respiratory: CTA bilaterally, no wheezing, no rhonchi Abdominal: Soft, NT, ND, bowel sounds + Extremities: no edema, no cyanosis    The results of significant diagnostics from this hospitalization (including imaging, microbiology, ancillary and laboratory) are listed below for reference.     Microbiology: Recent Results (from the past 240 hour(s))  SARS Coronavirus 2 by RT PCR (hospital order, performed in [facility] hospital lab) Nasopharyngeal Nasopharyngeal Swab     Status: None   Collection Time: 10/30/19 12:10 AM   Specimen: Nasopharyngeal Swab  Result Value Ref Range Status   SARS Coronavirus 2 NEGATIVE NEGATIVE Final    Comment: (NOTE) SARS-CoV-2 target nucleic acids are NOT DETECTED.  The SARS-CoV-2 RNA is generally detectable in upper and lower respiratory specimens during the acute phase of infection. The lowest concentration of SARS-CoV-2 viral copies this assay can detect is 250 copies / mL. A negative result does not preclude SARS-CoV-2 infection and should not be used as the sole basis for treatment or other patient management decisions.  A negative result may occur with improper specimen collection / handling, submission of specimen other than nasopharyngeal swab, presence of viral mutation(s) within  the areas targeted by this assay, and inadequate number of viral copies (<250 copies / mL). A negative result must be combined with clinical observations, patient history, and epidemiological information.  Fact Sheet for Patients:   StrictlyIdeas.no  Fact Sheet for Healthcare Providers: BankingDealers.co.za  This test is not yet approved or  cleared by the [place] FDA and has been authorized for detection and/or diagnosis of SARS-CoV-2 by FDA under an Emergency Use Authorization (EUA).  This EUA will remain in effect (meaning this test can be used) for the duration of the COVID-19 declaration under Section 564(b)(1) of the Act, 21 U.S.C. section 360bbb-3(b)(1), unless the authorization is terminated or revoked sooner.  Performed at [facility], [place] [address], [place], [place] [address]   Blood culture (routine x 2)     Status: None (Preliminary result)   Collection Time: 10/30/19  8:08 AM   Specimen: BLOOD RIGHT HAND  Result Value Ref Range Status   Specimen Description BLOOD RIGHT HAND  Final   Special Requests   Final    BOTTLES DRAWN AEROBIC AND ANAEROBIC Blood Culture results may not be optimal due to an inadequate volume of blood received in culture bottles   Culture   Final    NO GROWTH < 24 HOURS Performed at [facility] [address] [place], [place] [address]    Report Status PENDING  Incomplete     Labs: BNP (last 3 results) No results for input(s): BNP in the last 8760 hours. Basic Metabolic Panel: Recent Labs  Lab 10/30/19 0010 10/30/19 0025 10/30/19 0808 10/31/19 0236 11/01/19 0311  NA 135 137 136 136 136  K 4.7 4.2 4.3 4.3 5.0  CL 102 100 98 100 102  CO2 18*  --  27 26 25   GLUCOSE 107* 107* 99 99 104*  BUN 24* 31* 23 21 19   CREATININE 1.83* 1.90* 1.77* 1.82* 1.91*  CALCIUM 10.0  --  10.2 9.3 9.1   Liver Function Tests: Recent Labs  Lab 10/30/19 0010  AST 48*  ALT 12   ALKPHOS 75  BILITOT 0.8  PROT 7.8  ALBUMIN 3.8   No results for input(s): LIPASE, AMYLASE in the last 168 hours. No results for input(s): AMMONIA in the last 168 hours. CBC: Recent Labs  Lab 10/30/19 0010 10/30/19 0025 10/30/19 0808 10/31/19 0236 11/01/19 0311  WBC 8.2  --  8.0 8.6 8.7  NEUTROABS 5.6  --   --  5.2  --   HGB 14.5 16.0 15.1 12.9* 12.5*  HCT 44.1 47.0 47.2 38.7* 38.6*  MCV 103.3*  --  104.0* 101.3* 102.7*  PLT 178  --  193 170 147*   Cardiac Enzymes: No results for input(s): CKTOTAL, CKMB, CKMBINDEX, TROPONINI in the last 168 hours. BNP: Invalid input(s): POCBNP CBG: Recent Labs  Lab 10/30/19 0013  GLUCAP 104*   D-Dimer No results for input(s): DDIMER in the last 72 hours. Hgb A1c No results for input(s): HGBA1C in the last 72 hours. Lipid Profile No results for input(s): CHOL, HDL, LDLCALC, TRIG, CHOLHDL, LDLDIRECT in the last 72 hours. Thyroid function studies No results for input(s): TSH, T4TOTAL, T3FREE, THYROIDAB in the last 72 hours.  Invalid input(s): FREET3 Anemia work up No results for input(s): VITAMINB12, FOLATE, FERRITIN, TIBC, IRON, RETICCTPCT in the last 72 hours. Urinalysis    Component Value Date/Time   COLORURINE YELLOW 10/30/2019 1418   APPEARANCEUR CLEAR 10/30/2019 1418   LABSPEC 1.011 10/30/2019 1418   PHURINE 7.0 10/30/2019 1418   GLUCOSEU NEGATIVE 10/30/2019 1418   HGBUR NEGATIVE 10/30/2019 1418   BILIRUBINUR NEGATIVE 10/30/2019 1418   KETONESUR NEGATIVE 10/30/2019 1418   PROTEINUR NEGATIVE 10/30/2019 1418   UROBILINOGEN 0.2 11/12/2009 0632   NITRITE NEGATIVE 10/30/2019 1418   LEUKOCYTESUR NEGATIVE 10/30/2019 1418   Sepsis Labs Invalid input(s): PROCALCITONIN,  WBC,  LACTICIDVEN Microbiology Recent Results (from the past 240 hour(s))  SARS Coronavirus 2 by RT PCR (hospital order, performed in [place] hospital lab) Nasopharyngeal Nasopharyngeal Swab     Status: None   Collection Time: 10/30/19 12:10 AM    Specimen: Nasopharyngeal Swab  Result Value Ref Range Status   SARS Coronavirus 2 NEGATIVE NEGATIVE Final    Comment: (NOTE) SARS-CoV-2 target nucleic acids are NOT DETECTED.  The SARS-CoV-2 RNA is generally detectable in upper and lower respiratory specimens during the acute phase of infection. The lowest concentration of SARS-CoV-2 viral copies this assay  can detect is 250 copies / mL. A negative result does not preclude SARS-CoV-2 infection and should not be used as the sole basis for treatment or other patient management decisions.  A negative result may occur with improper specimen collection / handling, submission of specimen other than nasopharyngeal swab, presence of viral mutation(s) within the areas targeted by this assay, and inadequate number of viral copies (<250 copies / mL). A negative result must be combined with clinical observations, patient history, and epidemiological information.  Fact Sheet for Patients:   StrictlyIdeas.no  Fact Sheet for Healthcare Providers: BankingDealers.co.za  This test is not yet approved or  cleared by the [place] FDA and has been authorized for detection and/or diagnosis of SARS-CoV-2 by FDA under an Emergency Use Authorization (EUA).  This EUA will remain in effect (meaning this test can be used) for the duration of the COVID-19 declaration under Section 564(b)(1) of the Act, 21 U.S.C. section 360bbb-3(b)(1), unless the authorization is terminated or revoked sooner.  Performed at [facility], [place] [address] [place], [place] [address]   Blood culture (routine x 2)     Status: None (Preliminary result)   Collection Time: 10/30/19  8:08 AM   Specimen: BLOOD RIGHT HAND  Result Value Ref Range Status   Specimen Description BLOOD RIGHT HAND  Final   Special Requests   Final    BOTTLES DRAWN AEROBIC AND ANAEROBIC Blood Culture results may not be optimal due to an inadequate  volume of blood received in culture bottles   Culture   Final    NO GROWTH < 24 HOURS Performed at [facility], [place] [address] [place], [place] [address]    Report Status PENDING  Incomplete     Time coordinating discharge: 35 minutes  SIGNED:   Rodena Goldmann, DO [facility] 11/01/2019, 10:40 AM  If 7PM-7AM, please contact night-coverage [email or web address]

## 2019-11-01 NOTE — Progress Notes (Signed)
PT Cancellation Note  Patient Details Name: David Duncan MRN: [id number] DOB: [birth year]   Cancelled Treatment:    Reason Eval/Treat Not Completed: Other (comment) per chart review patient discharging to SNF today. Holding PT per department protocol. Please page if patient is in need of urgent PT services prior to DC.    Windell Norfolk, DPT, PN1   Supplemental Physical Therapist [facility]    Pager [phone] Acute Rehab Office [phone]

## 2019-11-03 DIAGNOSIS — Z7901 Long term (current) use of anticoagulants: Secondary | ICD-10-CM | POA: Diagnosis not present

## 2019-11-03 DIAGNOSIS — F319 Bipolar disorder, unspecified: Secondary | ICD-10-CM | POA: Diagnosis not present

## 2019-11-03 DIAGNOSIS — I4891 Unspecified atrial fibrillation: Secondary | ICD-10-CM | POA: Diagnosis not present

## 2019-11-03 DIAGNOSIS — R296 Repeated falls: Secondary | ICD-10-CM | POA: Diagnosis not present

## 2019-11-03 DIAGNOSIS — M6281 Muscle weakness (generalized): Secondary | ICD-10-CM | POA: Diagnosis not present

## 2019-11-03 DIAGNOSIS — I10 Essential (primary) hypertension: Secondary | ICD-10-CM | POA: Diagnosis not present

## 2019-11-04 LAB — CULTURE, BLOOD (ROUTINE X 2)
Culture: NO GROWTH
Culture: NO GROWTH

## 2019-11-08 DIAGNOSIS — I482 Chronic atrial fibrillation, unspecified: Secondary | ICD-10-CM | POA: Diagnosis not present

## 2019-11-08 DIAGNOSIS — I5022 Chronic systolic (congestive) heart failure: Secondary | ICD-10-CM | POA: Diagnosis not present

## 2019-11-08 DIAGNOSIS — I420 Dilated cardiomyopathy: Secondary | ICD-10-CM | POA: Diagnosis not present

## 2019-11-08 DIAGNOSIS — I10 Essential (primary) hypertension: Secondary | ICD-10-CM | POA: Diagnosis not present

## 2019-11-08 DIAGNOSIS — I340 Nonrheumatic mitral (valve) insufficiency: Secondary | ICD-10-CM | POA: Diagnosis not present

## 2019-11-08 DIAGNOSIS — I480 Paroxysmal atrial fibrillation: Secondary | ICD-10-CM | POA: Diagnosis not present

## 2019-11-09 ENCOUNTER — Ambulatory Visit: Payer: PPO

## 2019-11-09 ENCOUNTER — Other Ambulatory Visit: Payer: Self-pay

## 2019-11-09 DIAGNOSIS — E785 Hyperlipidemia, unspecified: Secondary | ICD-10-CM

## 2019-11-09 DIAGNOSIS — I5022 Chronic systolic (congestive) heart failure: Secondary | ICD-10-CM

## 2019-11-09 NOTE — Chronic Care Management (AMB) (Signed)
Chronic Care Management Pharmacy  Name: David Duncan  MRN: [id number] DOB: 1931-03-01  Chief Complaint/ HPI  David Duncan,  86 y.o., male presents for their Follow-Up CCM visit with the clinical pharmacist via telephone. Patient's wife, David Duncan, assisted with visit.  PCP : David Greenspan, MD  Their chronic conditions include: hypertension (per cardio) AFIB, CKD, CHF, depression, bipolar disorder, hypothyroidism  Patient concerns: denies medication concerns, reports patient is in rehab facility for about 1 more week due to recent hospitalization for fall and CAP   Office Visits: --> Due for annual wellness visit   08/07/18: [facility] - rx for escitalopram, pt to f/u with psychiatry, trial Pepcid OTC for heart burn --> takes Tums for PRN heartburn   Consult Visit:  11/09/19: Cardiology - AFIB and CHF follow up, HTN is stable, CHF, stable, Entresto and Lasix have been discontinued due to dehydration and hypotension, AFIB with symptoms of irregular heart beat and lower extremity edema, on metoprolol, stable, on warfarin for stroke prevention, enroll in cardiac rehab   11/04/19: [facility] rehab note - patient was hospitalized 10/29/19 for fall, diagnosed with CAP, transferred to rehab on 11/01/19, BP table, walking with walker, continues antibiotics, on venlafaxine and Lexapro for Bipolar, metoprolol and warfarin for AFIB, CKD with creatinine stable at 1.9  09/14/19: Bipolar Disorder/[place] - note unavailable   05/09/19: Nephrology - CKD stage 3, secondary HTN and CHF, continue Entresto, rtc 1 week for labs, some orthostatic symptoms, f/u with cardiology  05/07/19: Cardiology - continue current medications, rtc 6 months   Allergies  Allergen Reactions  . Baclofen     Confusion/delerium   Medications: Outpatient Encounter Medications as of 11/09/2019  Medication Sig  . acetaminophen (TYLENOL) 500 MG tablet Take 500-1,000 mg by mouth every 6 (six) hours as needed for mild pain.    . Amino Acids (FREE FORM AMINO ACID COMPLEX PO) Take 1 capsule by mouth 2 (two) times Duncan.   [name] Kitchen Apoaequorin (PREVAGEN) 10 MG CAPS Take 1 capsule by mouth Duncan.   . Ascorbic Acid (VITAMIN C) 500 MG tablet Take 500 mg by mouth Duncan.   . B Complex Vitamins (B COMPLEX PO) Take 1 tablet by mouth Duncan.   . Cyanocobalamin (VITAMIN B-12) 1000 MCG SUBL Place 1 tablet under the tongue Duncan.   [name] Kitchen escitalopram (LEXAPRO) 10 MG tablet Take 1 tablet (10 mg total) by mouth Duncan. (Patient not taking: Reported on 10/30/2019)  . Levomefolate Glucosamine (METHYLFOLATE PO) Take 1 tablet by mouth Duncan.  [name] Kitchen Lysine 500 MG CAPS Take 1 capsule by mouth 2 (two) times Duncan.  [name] Kitchen MELATONIN PO Take 2 tablets by mouth at bedtime. Restful Sleep by [facility] : Hops Extract 4:1 (Humulus lupulus) (flower) Chamomile (Matricaria chamomilla) (flower) Passion Flower Extract (standardized to 4% vitexin) (Passiflora incarnate) (flower) 5 HTP (5-Hydroxtryptophan) (Griffonia simplicifolia) (seed)  . metoprolol succinate (TOPROL-XL) 100 MG 24 hr tablet Take 1 tablet (100 mg total) by mouth Duncan. Take with or immediately following a meal.  . Multiple Vitamins-Minerals (PRESERVISION AREDS 2) CAPS Take 1 capsule by mouth 2 (two) times Duncan.  . polyethylene glycol (MIRALAX / GLYCOLAX) packet Take 17 g by mouth Duncan. (Patient not taking: Reported on 10/30/2019)  . Specialty Vitamins Products (PROSTATE PO) Take 1 tablet by mouth Duncan.   [name] Kitchen venlafaxine XR (EFFEXOR-XR) 75 MG 24 hr capsule Take 75 mg by mouth every morning.  . warfarin (COUMADIN) 2 MG tablet Take 2 mg by mouth Duncan.  [name] Kitchen warfarin (COUMADIN)  3 MG tablet Take 1 tablet (3 mg total) by mouth Duncan. (Patient not taking: Reported on 10/30/2019)   No facility-administered encounter medications on file as of 11/09/2019.   Current Diagnosis/Assessment: Goals    . Increase water intake     Starting 12/31/2015, I will attempt to drink at least 6-8 glasses of water Duncan.     [name] Kitchen Pharmacy  Care Plan     CARE PLAN ENTRY  Current Barriers:  . Chronic Disease Management support, education, and care coordination needs related to Hyperlipidemia and Heart Failure   Hyperlipidemia Lab Results  Component Value Date/Time   LDLCALC 106 (H) 02/06/2016 11:57 AM .  Pharmacist Clinical Goal(s): o Over the next 6 months, patient will work with PharmD and providers to achieve LDL goal < 70 . Current regimen:  o Cholest-Wise Supplement . Interventions: o Recommend updating lipid panel at annual wellness visit given history of stroke . Patient self care activities - Over the next 6 months, patient will: o Continue current medications   Chronic Systolic Heart Failure . Pharmacist Clinical Goal(s) o Over the next 6 months, patient will work with PharmD and providers to prevent exacerbation of shortness of breath and fluid overload . Current regimen:   Furosemide 20 mg - 1 tablet Duncan   Metoprolol succinate 100 mg - 1 tablet Duncan with meal  Entresto 24-26 mg - 1 tablet twice Duncan . Interventions: o Reviewed medical records and per cardiology visit 11/08/19, furosemide and Entresto discontinued due to hypotension and dehydration . Patient self care activities - Over the next 6 months, patient will: o Please confirm whether cardiology would like to resume Entresto and furosemide once discharged from rehab  Pharmacist updated medication list per patient and caregiver report to prevent medication errors.   Please see past updates related to this goal by clicking on the "Past Updates" button in the selected goal       Hyperlipidemia   Lipid Panel     Component Value Date/Time   CHOL 171 02/06/2016 1157   TRIG 95.0 02/06/2016 1157   HDL 46.40 02/06/2016 1157   CHOLHDL 4 02/06/2016 1157   VLDL 19.0 02/06/2016 1157   LDLCALC 106 (H) 02/06/2016 1157    CAD/CVA 6 years ago; LDL goal < 70 Patient has failed these meds in past: denies  Patient is currently uncontrolled on the  following medications:   Colest-wise - started around March 2021  We discussed: patient declines statin therapy, would like to evaluate lipids since starting cholesterol supplement   Plan:  Recommend scheduling AWV and updating lipid panel with annual labs.   AFIB   CBC Latest Ref Rng & Units 11/01/2019 10/31/2019 10/30/2019  WBC 4.0 - 10.5 K/uL 8.7 8.6 8.0  Hemoglobin 13.0 - 17.0 g/dL 12.5(L) 12.9(L) 15.1  Hematocrit 39 - 52 % 38.6(L) 38.7(L) 47.2  Platelets 150 - 400 K/uL 147(L) 170 193   Patient is currently rate controlled. Patient is currently controlled on the following medications:  Metoprolol succinate 100 mg - 1 tablet Duncan with meal  Warfarin 2 mg - 1 tablet Duncan  We discussed: [place] hospital monitors INR, usually checked about every 6-8 weeks, CBC stable   Plan: Continue current medications  Heart Failure   CMP Latest Ref Rng & Units 11/01/2019 10/31/2019 10/30/2019  Glucose 70 - 99 mg/dL 104(H) 99 99  BUN 8 - 23 mg/dL 19 21 23   Creatinine 0.61 - 1.24 mg/dL 1.91(H) 1.82(H) 1.77(H)  Sodium 135 - 145 mmol/L 136  136 136  Potassium 3.5 - 5.1 mmol/L 5.0 4.3 4.3  Chloride 98 - 111 mmol/L 102 100 98  CO2 22 - 32 mmol/L 25 26 27   Calcium 8.9 - 10.3 mg/dL 9.1 9.3 10.2  Total Protein 6.5 - 8.1 g/dL - - -  Total Bilirubin 0.3 - 1.2 mg/dL - - -  Alkaline Phos 38 - 126 U/L - - -  AST 15 - 41 U/L - - -  ALT 0 - 44 U/L - - -   Type: Systolic Last ejection fraction: 2019 30% NYHA Class: III (marked limitation of activity) AHA HF Stage: C (Heart disease and symptoms present)  Patient has failed these meds in past: none reported Patient is currently controlled on the following medications:   Furosemide 20 mg - 1 tablet Duncan   Metoprolol succinate 100 mg - 1 tablet Duncan with meal  Entresto 24-26 mg - 1 tablet BID  Recent med changes: per cardiology note 11/08/19, furosemide and Entresto discontinued due to hypotension and dehydration, caregiver states pt will be  resuming these after rehab, need to clarify with cardiology  Plan: Continue current medications; Recommend clarifying whether cardiologist would like patient to continue furosemide and Entresto. Discussed this with patient and she will call when patient is discharged for clarification.  Depression/Bipolar Disorder   Followed by Dr. Nicolasa Ducking Patient has failed these meds in past: sertraline, lithium, Wellbutrin, Lamictal, Depakote, escitalopram  Patient is currently controlled on the following medications:   Effexor 37.5 mg - once Duncan   We discussed: started early 2021, reports doing well; according to rehab notes, pt is on  Escitalopram and Effexor, likely due to inaccurate med-list, but per caregiver he will not be taking Lexapro once discharged and has not been on Lexapro this year.  Plan: Continue current medications   Medication Management Patients wife has recently dropped off a medication list at [facility] and would like the medication list updated in [place]. Updated medication list accordingly.  OTCs: Acetaminophen 650 mg - 1 arthritis strength Duncan, Prevagen 10 mg, vitamin C 500 mg, B-complex, Calcium-Magnesium, B12 1000 mcg SL, melatonin PO, saw palmetto, methyl-folate, Resveratrol, vitamin D3, tumeric, Maxi-vision, Lysine, Liquid amino-acids, Gotu [place] (herb for [employer])  Pharmacy: [facility], uses auto-refill, doing well   Adherence: refills timely except warfarin but wife confirms patient is taking as directed  Affordability: denies concerns   Vaccines: patient declines vaccines   CCM Follow Up: 6 months, telephone   Debbora Dus, PharmD Clinical Pharmacist [facility] [phone]

## 2019-11-09 NOTE — Patient Instructions (Addendum)
Dear David Duncan,  Below is a summary of the goals we discussed during our follow up appointment on November 09, 2019. Please contact me anytime with questions or concerns.   Visit Information  Goals Addressed            This Visit's Progress   . Pharmacy Care Plan       CARE PLAN ENTRY  Current Barriers:  . Chronic Disease Management support, education, and care coordination needs related to Hyperlipidemia and Heart Failure   Hyperlipidemia Lab Results  Component Value Date/Time   LDLCALC 106 (H) 02/06/2016 11:57 AM .  Pharmacist Clinical Goal(s): o Over the next 6 months, patient will work with PharmD and providers to achieve LDL goal < 70 . Current regimen:  o Cholest-Wise Supplement . Interventions: o Recommend updating lipid panel at annual wellness visit given history of stroke . Patient self care activities - Over the next 6 months, patient will: o Continue current medications   Chronic Systolic Heart Failure . Pharmacist Clinical Goal(s) o Over the next 6 months, patient will work with PharmD and providers to prevent exacerbation of shortness of breath and fluid overload . Current regimen:   Furosemide 20 mg - 1 tablet daily   Metoprolol succinate 100 mg - 1 tablet daily with meal  Entresto 24-26 mg - 1 tablet twice daily . Interventions: o Reviewed medical records and per cardiology visit 11/08/19, furosemide and Entresto discontinued due to hypotension and dehydration . Patient self care activities - Over the next 6 months, patient will: o Please confirm whether cardiology would like to resume Entresto and furosemide once discharged from rehab  Pharmacist updated medication list per patient and caregiver report to prevent medication errors.   Please see past updates related to this goal by clicking on the "Past Updates" button in the selected goal       The patient verbalized understanding of instructions provided today and agreed to receive a mailed  copy of patient instruction and/or educational materials.  Telephone follow up appointment with pharmacy team member scheduled for: recommend scheduling annual wellness visit with Dr. Glori Bickers, pharmacist follow up in 6 months (05/07/20 at 10 AM, telephone)  Debbora Dus, PharmD Clinical Pharmacist [facility] [place]   [place] refers to food and lifestyle choices that are based on the traditions of countries located on the [place]. This way of eating has been shown to help prevent certain conditions and improve outcomes for people who have chronic diseases, like kidney disease and heart disease. What are tips for following this plan? Lifestyle  Cook and eat meals together with your family, when possible.  Drink enough fluid to keep your urine clear or pale yellow.  Be physically active every day. This includes: ? Aerobic exercise like running or swimming. ? Leisure activities like gardening, walking, or housework.  Get 7-8 hours of sleep each night.  If recommended by your health care provider, drink red wine in moderation. This means 1 glass a day for nonpregnant women and 2 glasses a day for men. A glass of wine equals 5 oz (150 mL). Reading food labels   Check the serving size of packaged foods. For foods such as rice and pasta, the serving size refers to the amount of cooked product, not dry.  Check the total fat in packaged foods. Avoid foods that have saturated fat or trans fats.  Check the ingredients list for added sugars, such as corn  syrup. Shopping  At the grocery store, buy most of your food from the areas near the walls of the store. This includes: ? Fresh fruits and vegetables (produce). ? Grains, beans, nuts, and seeds. Some of these may be available in unpackaged forms or large amounts (in bulk). ? Fresh seafood. ? Poultry and eggs. ? Low-fat dairy products.  Buy whole ingredients instead  of prepackaged foods.  Buy fresh fruits and vegetables in-season from local farmers markets.  Buy frozen fruits and vegetables in resealable bags.  If you do not have access to quality fresh seafood, buy precooked frozen shrimp or canned fish, such as tuna, salmon, or sardines.  Buy small amounts of raw or cooked vegetables, salads, or olives from the deli or salad bar at your store.  Stock your pantry so you always have certain foods on hand, such as olive oil, canned tuna, canned tomatoes, rice, pasta, and beans. Cooking  Cook foods with extra-virgin olive oil instead of using butter or other vegetable oils.  Have meat as a side dish, and have vegetables or grains as your main dish. This means having meat in small portions or adding small amounts of meat to foods like pasta or stew.  Use beans or vegetables instead of meat in common dishes like chili or lasagna.  Experiment with different cooking methods. Try roasting or broiling vegetables instead of steaming or sauteing them.  Add frozen vegetables to soups, stews, pasta, or rice.  Add nuts or seeds for added healthy fat at each meal. You can add these to yogurt, salads, or vegetable dishes.  Marinate fish or vegetables using olive oil, lemon juice, garlic, and fresh herbs. Meal planning   Plan to eat 1 vegetarian meal one day each week. Try to work up to 2 vegetarian meals, if possible.  Eat seafood 2 or more times a week.  Have healthy snacks readily available, such as: ? Vegetable sticks with hummus. ? [place] yogurt. ? Fruit and nut trail mix.  Eat balanced meals throughout the week. This includes: ? Fruit: 2-3 servings a day ? Vegetables: 4-5 servings a day ? Low-fat dairy: 2 servings a day ? Fish, poultry, or lean meat: 1 serving a day ? Beans and legumes: 2 or more servings a week ? Nuts and seeds: 1-2 servings a day ? Whole grains: 6-8 servings a day ? Extra-virgin olive oil: 3-4 servings a day  Limit red  meat and sweets to only a few servings a month What are my food choices?  Mediterranean diet ? Recommended  Grains: Whole-grain pasta. Brown rice. Bulgar wheat. Polenta. Couscous. Whole-wheat bread. [place].  Vegetables: Artichokes. Beets. Broccoli. Cabbage. Carrots. Eggplant. Green beans. Chard. Kale. Spinach. Onions. Leeks. Peas. Squash. Tomatoes. Peppers. Radishes.  Fruits: Apples. Apricots. Avocado. Berries. Bananas. Cherries. Dates. Figs. Grapes. Lemons. Melon. Oranges. Peaches. Plums. Pomegranate.  Meats and other protein foods: Beans. Almonds. Sunflower seeds. Pine nuts. Peanuts. [place]. Salmon. Scallops. Shrimp. [place]. Tilapia. Clams. Oysters. Eggs.  Dairy: Low-fat milk. Cheese. Greek yogurt.  Beverages: Water. Red wine. Herbal tea.  Fats and oils: Extra virgin olive oil. Avocado oil. Grape seed oil.  Sweets and desserts: [place] yogurt with honey. Baked apples. Poached pears. Trail mix.  Seasoning and other foods: Basil. Cilantro. Coriander. Cumin. Mint. Parsley. Sage. Rosemary. Tarragon. Garlic. Oregano. Thyme. Pepper. Balsalmic vinegar. Tahini. Hummus. Tomato sauce. Olives. Mushrooms. ? Limit these  Grains: Prepackaged pasta or rice dishes. Prepackaged cereal with added sugar.  Vegetables: Deep fried potatoes (french fries).  Fruits: Fruit canned in syrup.  Meats and other protein foods: Beef. Pork. Lamb. Poultry with skin. Hot dogs. [place] Salines.  Dairy: Ice cream. Sour cream. Whole milk.  Beverages: Juice. Sugar-sweetened soft drinks. Beer. Liquor and spirits.  Fats and oils: Butter. Canola oil. Vegetable oil. Beef fat (tallow). Lard.  Sweets and desserts: Cookies. Cakes. Pies. Candy.  Seasoning and other foods: Mayonnaise. Premade sauces and marinades. The items listed may not be a complete list. Talk with your dietitian about what dietary choices are right for you. Summary  The Mediterranean diet includes both food and lifestyle choices.  Eat a variety of  fresh fruits and vegetables, beans, nuts, seeds, and whole grains.  Limit the amount of red meat and sweets that you eat.  Talk with your health care provider about whether it is safe for you to drink red wine in moderation. This means 1 glass a day for nonpregnant women and 2 glasses a day for men. A glass of wine equals 5 oz (150 mL). This information is not intended to replace advice given to you by your health care provider. Make sure you discuss any questions you have with your health care provider. Document Revised: 11/27/2015 Document Reviewed: 11/20/2015 Elsevier Patient Education  [place].

## 2019-11-10 DIAGNOSIS — I4891 Unspecified atrial fibrillation: Secondary | ICD-10-CM | POA: Diagnosis not present

## 2019-11-10 DIAGNOSIS — N189 Chronic kidney disease, unspecified: Secondary | ICD-10-CM | POA: Diagnosis not present

## 2019-11-10 DIAGNOSIS — Z7901 Long term (current) use of anticoagulants: Secondary | ICD-10-CM | POA: Diagnosis not present

## 2019-11-10 DIAGNOSIS — F319 Bipolar disorder, unspecified: Secondary | ICD-10-CM | POA: Diagnosis not present

## 2019-11-10 DIAGNOSIS — R531 Weakness: Secondary | ICD-10-CM | POA: Diagnosis not present

## 2019-11-10 DIAGNOSIS — I10 Essential (primary) hypertension: Secondary | ICD-10-CM | POA: Diagnosis not present

## 2019-11-15 NOTE — Progress Notes (Signed)
I have collaborated with the care management provider regarding care management and care coordination activities outlined in this encounter and have reviewed this encounter including documentation in the note and care plan. I am certifying that I agree with the content of this note and encounter as supervising physician. Demetrie Borge MD  

## 2019-11-21 ENCOUNTER — Emergency Department: Payer: PPO

## 2019-11-21 ENCOUNTER — Inpatient Hospital Stay: Payer: PPO

## 2019-11-21 ENCOUNTER — Inpatient Hospital Stay
Admission: EM | Admit: 2019-11-21 | Discharge: 2019-12-12 | DRG: 871 | Disposition: E | Payer: PPO | Source: Skilled Nursing Facility | Attending: Internal Medicine | Admitting: Internal Medicine

## 2019-11-21 DIAGNOSIS — R918 Other nonspecific abnormal finding of lung field: Secondary | ICD-10-CM | POA: Diagnosis not present

## 2019-11-21 DIAGNOSIS — F411 Generalized anxiety disorder: Secondary | ICD-10-CM | POA: Diagnosis present

## 2019-11-21 DIAGNOSIS — E871 Hypo-osmolality and hyponatremia: Secondary | ICD-10-CM | POA: Diagnosis present

## 2019-11-21 DIAGNOSIS — Z8249 Family history of ischemic heart disease and other diseases of the circulatory system: Secondary | ICD-10-CM

## 2019-11-21 DIAGNOSIS — Z9981 Dependence on supplemental oxygen: Secondary | ICD-10-CM | POA: Diagnosis not present

## 2019-11-21 DIAGNOSIS — J90 Pleural effusion, not elsewhere classified: Secondary | ICD-10-CM | POA: Diagnosis not present

## 2019-11-21 DIAGNOSIS — Z8673 Personal history of transient ischemic attack (TIA), and cerebral infarction without residual deficits: Secondary | ICD-10-CM

## 2019-11-21 DIAGNOSIS — N183 Chronic kidney disease, stage 3 unspecified: Secondary | ICD-10-CM | POA: Diagnosis present

## 2019-11-21 DIAGNOSIS — I482 Chronic atrial fibrillation, unspecified: Secondary | ICD-10-CM | POA: Diagnosis not present

## 2019-11-21 DIAGNOSIS — R6520 Severe sepsis without septic shock: Secondary | ICD-10-CM | POA: Diagnosis not present

## 2019-11-21 DIAGNOSIS — E785 Hyperlipidemia, unspecified: Secondary | ICD-10-CM | POA: Diagnosis not present

## 2019-11-21 DIAGNOSIS — Z7901 Long term (current) use of anticoagulants: Secondary | ICD-10-CM

## 2019-11-21 DIAGNOSIS — H3530 Unspecified macular degeneration: Secondary | ICD-10-CM | POA: Diagnosis present

## 2019-11-21 DIAGNOSIS — N1832 Chronic kidney disease, stage 3b: Secondary | ICD-10-CM | POA: Diagnosis present

## 2019-11-21 DIAGNOSIS — I4891 Unspecified atrial fibrillation: Secondary | ICD-10-CM | POA: Diagnosis not present

## 2019-11-21 DIAGNOSIS — R945 Abnormal results of liver function studies: Secondary | ICD-10-CM | POA: Diagnosis not present

## 2019-11-21 DIAGNOSIS — R0602 Shortness of breath: Secondary | ICD-10-CM

## 2019-11-21 DIAGNOSIS — I517 Cardiomegaly: Secondary | ICD-10-CM | POA: Diagnosis not present

## 2019-11-21 DIAGNOSIS — J9621 Acute and chronic respiratory failure with hypoxia: Secondary | ICD-10-CM | POA: Diagnosis not present

## 2019-11-21 DIAGNOSIS — I5022 Chronic systolic (congestive) heart failure: Secondary | ICD-10-CM | POA: Diagnosis not present

## 2019-11-21 DIAGNOSIS — G9389 Other specified disorders of brain: Secondary | ICD-10-CM | POA: Diagnosis not present

## 2019-11-21 DIAGNOSIS — Z515 Encounter for palliative care: Secondary | ICD-10-CM | POA: Diagnosis not present

## 2019-11-21 DIAGNOSIS — A419 Sepsis, unspecified organism: Principal | ICD-10-CM | POA: Diagnosis present

## 2019-11-21 DIAGNOSIS — Z66 Do not resuscitate: Secondary | ICD-10-CM | POA: Diagnosis not present

## 2019-11-21 DIAGNOSIS — R404 Transient alteration of awareness: Secondary | ICD-10-CM | POA: Diagnosis not present

## 2019-11-21 DIAGNOSIS — I429 Cardiomyopathy, unspecified: Secondary | ICD-10-CM | POA: Diagnosis not present

## 2019-11-21 DIAGNOSIS — Z85820 Personal history of malignant melanoma of skin: Secondary | ICD-10-CM

## 2019-11-21 DIAGNOSIS — I700 Atherosclerosis of aorta: Secondary | ICD-10-CM | POA: Diagnosis not present

## 2019-11-21 DIAGNOSIS — N400 Enlarged prostate without lower urinary tract symptoms: Secondary | ICD-10-CM | POA: Diagnosis present

## 2019-11-21 DIAGNOSIS — F319 Bipolar disorder, unspecified: Secondary | ICD-10-CM | POA: Diagnosis present

## 2019-11-21 DIAGNOSIS — E039 Hypothyroidism, unspecified: Secondary | ICD-10-CM | POA: Diagnosis not present

## 2019-11-21 DIAGNOSIS — Y95 Nosocomial condition: Secondary | ICD-10-CM | POA: Diagnosis present

## 2019-11-21 DIAGNOSIS — R069 Unspecified abnormalities of breathing: Secondary | ICD-10-CM | POA: Diagnosis not present

## 2019-11-21 DIAGNOSIS — G9341 Metabolic encephalopathy: Secondary | ICD-10-CM | POA: Diagnosis present

## 2019-11-21 DIAGNOSIS — K219 Gastro-esophageal reflux disease without esophagitis: Secondary | ICD-10-CM | POA: Diagnosis not present

## 2019-11-21 DIAGNOSIS — T45515A Adverse effect of anticoagulants, initial encounter: Secondary | ICD-10-CM | POA: Diagnosis present

## 2019-11-21 DIAGNOSIS — F32A Depression, unspecified: Secondary | ICD-10-CM | POA: Diagnosis present

## 2019-11-21 DIAGNOSIS — Z96652 Presence of left artificial knee joint: Secondary | ICD-10-CM | POA: Diagnosis present

## 2019-11-21 DIAGNOSIS — Z823 Family history of stroke: Secondary | ICD-10-CM

## 2019-11-21 DIAGNOSIS — R791 Abnormal coagulation profile: Secondary | ICD-10-CM | POA: Diagnosis not present

## 2019-11-21 DIAGNOSIS — J189 Pneumonia, unspecified organism: Secondary | ICD-10-CM | POA: Diagnosis present

## 2019-11-21 DIAGNOSIS — I7090 Unspecified atherosclerosis: Secondary | ICD-10-CM | POA: Diagnosis not present

## 2019-11-21 DIAGNOSIS — R7989 Other specified abnormal findings of blood chemistry: Secondary | ICD-10-CM | POA: Diagnosis present

## 2019-11-21 DIAGNOSIS — J0130 Acute sphenoidal sinusitis, unspecified: Secondary | ICD-10-CM | POA: Diagnosis not present

## 2019-11-21 DIAGNOSIS — Z79899 Other long term (current) drug therapy: Secondary | ICD-10-CM

## 2019-11-21 DIAGNOSIS — I2510 Atherosclerotic heart disease of native coronary artery without angina pectoris: Secondary | ICD-10-CM | POA: Diagnosis not present

## 2019-11-21 DIAGNOSIS — I5023 Acute on chronic systolic (congestive) heart failure: Secondary | ICD-10-CM | POA: Diagnosis present

## 2019-11-21 DIAGNOSIS — R0902 Hypoxemia: Secondary | ICD-10-CM | POA: Diagnosis not present

## 2019-11-21 DIAGNOSIS — Z809 Family history of malignant neoplasm, unspecified: Secondary | ICD-10-CM

## 2019-11-21 DIAGNOSIS — I5021 Acute systolic (congestive) heart failure: Secondary | ICD-10-CM | POA: Diagnosis not present

## 2019-11-21 DIAGNOSIS — Z85828 Personal history of other malignant neoplasm of skin: Secondary | ICD-10-CM

## 2019-11-21 DIAGNOSIS — Z20822 Contact with and (suspected) exposure to covid-19: Secondary | ICD-10-CM | POA: Diagnosis present

## 2019-11-21 DIAGNOSIS — E861 Hypovolemia: Secondary | ICD-10-CM | POA: Diagnosis present

## 2019-11-21 DIAGNOSIS — R0689 Other abnormalities of breathing: Secondary | ICD-10-CM | POA: Diagnosis not present

## 2019-11-21 DIAGNOSIS — Z87442 Personal history of urinary calculi: Secondary | ICD-10-CM

## 2019-11-21 DIAGNOSIS — J320 Chronic maxillary sinusitis: Secondary | ICD-10-CM | POA: Diagnosis not present

## 2019-11-21 DIAGNOSIS — Z7189 Other specified counseling: Secondary | ICD-10-CM | POA: Diagnosis not present

## 2019-11-21 DIAGNOSIS — F329 Major depressive disorder, single episode, unspecified: Secondary | ICD-10-CM | POA: Diagnosis present

## 2019-11-21 LAB — HEPATITIS PANEL, ACUTE
HCV Ab: NONREACTIVE
Hep A IgM: NONREACTIVE
Hep B C IgM: NONREACTIVE
Hepatitis B Surface Ag: NONREACTIVE

## 2019-11-21 LAB — COMPREHENSIVE METABOLIC PANEL
ALT: 162 U/L — ABNORMAL HIGH (ref 0–44)
AST: 287 U/L — ABNORMAL HIGH (ref 15–41)
Albumin: 3.2 g/dL — ABNORMAL LOW (ref 3.5–5.0)
Alkaline Phosphatase: 115 U/L (ref 38–126)
Anion gap: 14 (ref 5–15)
BUN: 41 mg/dL — ABNORMAL HIGH (ref 8–23)
CO2: 18 mmol/L — ABNORMAL LOW (ref 22–32)
Calcium: 8.8 mg/dL — ABNORMAL LOW (ref 8.9–10.3)
Chloride: 96 mmol/L — ABNORMAL LOW (ref 98–111)
Creatinine, Ser: 1.89 mg/dL — ABNORMAL HIGH (ref 0.61–1.24)
GFR calc Af Amer: 36 mL/min — ABNORMAL LOW (ref >60)
GFR calc non Af Amer: 31 mL/min — ABNORMAL LOW (ref >60)
Glucose, Bld: 103 mg/dL — ABNORMAL HIGH (ref 70–99)
Potassium: 4.9 mmol/L (ref 3.5–5.1)
Sodium: 128 mmol/L — ABNORMAL LOW (ref 135–145)
Total Bilirubin: 1.5 mg/dL — ABNORMAL HIGH (ref 0.3–1.2)
Total Protein: 7.9 g/dL (ref 6.5–8.1)

## 2019-11-21 LAB — LACTIC ACID, PLASMA
Lactic Acid, Venous: 3.5 mmol/L (ref 0.5–1.9)
Lactic Acid, Venous: 2.8 mmol/L (ref 0.5–1.9)
Lactic Acid, Venous: 3.4 mmol/L (ref 0.5–1.9)

## 2019-11-21 LAB — BRAIN NATRIURETIC PEPTIDE: B Natriuretic Peptide: 1388.7 pg/mL — ABNORMAL HIGH (ref 0.0–100.0)

## 2019-11-21 LAB — BLOOD GAS, VENOUS
Acid-base deficit: 6.4 mmol/L — ABNORMAL HIGH (ref 0.0–2.0)
Bicarbonate: 20.2 mmol/L (ref 20.0–28.0)
FIO2: 0.21
O2 Saturation: 26.8 %
Patient temperature: 37
pCO2, Ven: 43 mmHg — ABNORMAL LOW (ref 44.0–60.0)
pH, Ven: 7.28 (ref 7.250–7.430)

## 2019-11-21 LAB — CBC WITH DIFFERENTIAL/PLATELET
Abs Immature Granulocytes: 0.06 10*3/uL (ref 0.00–0.07)
Basophils Absolute: 0 10*3/uL (ref 0.0–0.1)
Basophils Relative: 0 %
Eosinophils Absolute: 0 10*3/uL (ref 0.0–0.5)
Eosinophils Relative: 0 %
HCT: 37.4 % — ABNORMAL LOW (ref 39.0–52.0)
Hemoglobin: 12.6 g/dL — ABNORMAL LOW (ref 13.0–17.0)
Immature Granulocytes: 1 %
Lymphocytes Relative: 8 %
Lymphs Abs: 0.9 10*3/uL (ref 0.7–4.0)
MCH: 33.7 pg (ref 26.0–34.0)
MCHC: 33.7 g/dL (ref 30.0–36.0)
MCV: 100 fL (ref 80.0–100.0)
Monocytes Absolute: 0.7 10*3/uL (ref 0.1–1.0)
Monocytes Relative: 6 %
Neutro Abs: 10.2 10*3/uL — ABNORMAL HIGH (ref 1.7–7.7)
Neutrophils Relative %: 85 %
Platelets: 236 10*3/uL (ref 150–400)
RBC: 3.74 MIL/uL — ABNORMAL LOW (ref 4.22–5.81)
RDW: 14.6 % (ref 11.5–15.5)
WBC: 11.9 10*3/uL — ABNORMAL HIGH (ref 4.0–10.5)
nRBC: 0.3 % — ABNORMAL HIGH (ref 0.0–0.2)

## 2019-11-21 LAB — AMMONIA: Ammonia: 37 umol/L — ABNORMAL HIGH (ref 9–35)

## 2019-11-21 LAB — PROTIME-INR
INR: >10 (ref 0.8–1.2)
Prothrombin Time: >90 seconds — ABNORMAL HIGH (ref 11.4–15.2)

## 2019-11-21 LAB — SARS CORONAVIRUS 2 BY RT PCR (HOSPITAL ORDER, PERFORMED IN ~~LOC~~ HOSPITAL LAB): SARS Coronavirus 2: NEGATIVE

## 2019-11-21 LAB — APTT: aPTT: 127 seconds — ABNORMAL HIGH (ref 24–36)

## 2019-11-21 LAB — FIBRIN DERIVATIVES D-DIMER (ARMC ONLY): Fibrin derivatives D-dimer (ARMC): 2816.98 ng/mL (FEU) — ABNORMAL HIGH (ref 0.00–499.00)

## 2019-11-21 MED ORDER — SODIUM CHLORIDE 0.9 % IV SOLN: INTRAVENOUS | Status: DC

## 2019-11-21 MED ORDER — BISMUTH SUBSALICYLATE 262 MG/15ML PO SUSP: 30.0000 mL | Freq: Three times a day (TID) | ORAL | Status: DC

## 2019-11-21 MED ORDER — ASCORBIC ACID 500 MG PO TABS
500.0000 mg | ORAL_TABLET | Freq: Every day | ORAL | Status: DC
  Administered 2019-11-21: 500 mg via ORAL
  Filled 2019-11-21 (×2): qty 1

## 2019-11-21 MED ORDER — ONDANSETRON HCL 4 MG/2ML IJ SOLN: 4.0000 mg | Freq: Three times a day (TID) | INTRAMUSCULAR | Status: DC | PRN

## 2019-11-21 MED ORDER — SODIUM CHLORIDE 0.9 % IV BOLUS
500.0000 mL | Freq: Once | INTRAVENOUS | Status: AC
  Administered 2019-11-21: 500 mL via INTRAVENOUS

## 2019-11-21 MED ORDER — PHYTONADIONE 5 MG PO TABS
5.0000 mg | ORAL_TABLET | Freq: Once | ORAL | Status: DC
  Filled 2019-11-21: qty 1

## 2019-11-21 MED ORDER — VANCOMYCIN HCL 750 MG/150ML IV SOLN
750.0000 mg | Freq: Once | INTRAVENOUS | Status: AC
  Administered 2019-11-21: 750 mg via INTRAVENOUS
  Filled 2019-11-21: qty 150

## 2019-11-21 MED ORDER — MELATONIN 5 MG PO TABS
5.0000 mg | ORAL_TABLET | Freq: Every day | ORAL | Status: DC
  Administered 2019-11-21: 5 mg via ORAL
  Filled 2019-11-21 (×2): qty 1

## 2019-11-21 MED ORDER — RENA-VITE PO TABS
1.0000 | ORAL_TABLET | Freq: Every day | ORAL | Status: DC
  Administered 2019-11-21: 1 via ORAL
  Filled 2019-11-21 (×3): qty 1

## 2019-11-21 MED ORDER — METOPROLOL SUCCINATE ER 50 MG PO TB24
100.0000 mg | ORAL_TABLET | Freq: Every day | ORAL | Status: DC
  Administered 2019-11-21 – 2019-11-22 (×2): 100 mg via ORAL
  Filled 2019-11-21 (×2): qty 2

## 2019-11-21 MED ORDER — VANCOMYCIN HCL IN DEXTROSE 1-5 GM/200ML-% IV SOLN
1000.0000 mg | Freq: Once | INTRAVENOUS | Status: AC
  Administered 2019-11-21: 1000 mg via INTRAVENOUS
  Filled 2019-11-21: qty 200

## 2019-11-21 MED ORDER — OCUVITE-LUTEIN PO CAPS
1.0000 | ORAL_CAPSULE | Freq: Two times a day (BID) | ORAL | Status: DC
  Administered 2019-11-21: 1 via ORAL
  Filled 2019-11-21 (×4): qty 1

## 2019-11-21 MED ORDER — DM-GUAIFENESIN ER 30-600 MG PO TB12: 1.0000 | ORAL_TABLET | Freq: Two times a day (BID) | ORAL | Status: DC | PRN

## 2019-11-21 MED ORDER — VANCOMYCIN HCL 750 MG/150ML IV SOLN
750.0000 mg | INTRAVENOUS | Status: DC
  Administered 2019-11-22: 750 mg via INTRAVENOUS
  Filled 2019-11-21: qty 150

## 2019-11-21 MED ORDER — ALBUTEROL SULFATE (2.5 MG/3ML) 0.083% IN NEBU: 2.5000 mg | INHALATION_SOLUTION | RESPIRATORY_TRACT | Status: DC | PRN

## 2019-11-21 MED ORDER — VENLAFAXINE HCL ER 75 MG PO CP24: 75.0000 mg | ORAL_CAPSULE | Freq: Every morning | ORAL | Status: DC

## 2019-11-21 MED ORDER — APOAEQUORIN 10 MG PO CAPS: 1.0000 | ORAL_CAPSULE | Freq: Every day | ORAL | Status: DC

## 2019-11-21 MED ORDER — SODIUM CHLORIDE 0.9 % IV SOLN
2.0000 g | INTRAVENOUS | Status: DC
  Administered 2019-11-22: 2 g via INTRAVENOUS
  Filled 2019-11-21: qty 2

## 2019-11-21 MED ORDER — SODIUM CHLORIDE 0.9 % IV SOLN
2.0000 g | Freq: Once | INTRAVENOUS | Status: AC
  Administered 2019-11-21: 2 g via INTRAVENOUS
  Filled 2019-11-21: qty 2

## 2019-11-21 MED ORDER — LYSINE 500 MG PO CAPS: 1.0000 | ORAL_CAPSULE | Freq: Two times a day (BID) | ORAL | Status: DC

## 2019-11-21 MED ORDER — ALBUTEROL SULFATE (2.5 MG/3ML) 0.083% IN NEBU
5.0000 mg | INHALATION_SOLUTION | Freq: Once | RESPIRATORY_TRACT | Status: DC
  Filled 2019-11-21: qty 6

## 2019-11-21 MED ORDER — IPRATROPIUM BROMIDE 0.02 % IN SOLN
0.5000 mg | RESPIRATORY_TRACT | Status: DC
  Administered 2019-11-21 – 2019-11-22 (×3): 0.5 mg via RESPIRATORY_TRACT
  Filled 2019-11-21 (×2): qty 2.5

## 2019-11-21 MED ORDER — POLYETHYLENE GLYCOL 3350 17 G PO PACK
17.0000 g | PACK | Freq: Every day | ORAL | Status: DC
  Administered 2019-11-21: 17 g via ORAL
  Filled 2019-11-21: qty 1

## 2019-11-21 MED ORDER — LEVOMEFOLATE GLUCOSAMINE 400 MCG PO CAPS: 1.0000 | ORAL_CAPSULE | Freq: Every day | ORAL | Status: DC

## 2019-11-21 MED ORDER — DILTIAZEM HCL-DEXTROSE 125-5 MG/125ML-% IV SOLN (PREMIX): 5.0000 mg/h | INTRAVENOUS | Status: DC

## 2019-11-21 MED ORDER — VITAMIN B-12 1000 MCG PO TABS
1000.0000 ug | ORAL_TABLET | Freq: Every day | ORAL | Status: DC
  Administered 2019-11-21: 1000 ug via ORAL
  Filled 2019-11-21 (×3): qty 1

## 2019-11-21 NOTE — ED Notes (Signed)
Resumed care from annie rn.  Pt awake. Pt on high flow oxygen.  Pt waiting iv team for insertion.

## 2019-11-21 NOTE — Progress Notes (Signed)
Notified bedside nurse of need to draw repeat lactic acid. 

## 2019-11-21 NOTE — ED Notes (Signed)
2 iv's started by iv team nurse.  Iv fluids infusing.

## 2019-11-21 NOTE — ED Notes (Signed)
Lab called for recollect of lactic acid and admission labs due to patient being hard stick

## 2019-11-21 NOTE — ED Triage Notes (Signed)
Pt presents via [facility] from [facility] with c/o worsening shortness of breath. Pt wears 2L o2 at baseline, but facility reported increased work of breathing today. Facility increased patient's oxygen to 4L and oxygen saturation was 86% on 4L. Upon arrival patient oxygen 87% on 4L. Oxygen increased to 6L and oxygen saturation low 90s. Pt alert at this time. Increased work of breathing noted. Pt HR, 132 with hx of a-fib.

## 2019-11-21 NOTE — ED Notes (Signed)
IV accidentally removed while changing patient. Patient difficult IV stick. IV team consult placed at this time.

## 2019-11-21 NOTE — ED Provider Notes (Signed)
[facility] Emergency Department Provider Note   ____________________________________________   First MD Initiated Contact with Patient 11/20/2019 1108     (approximate)  I have reviewed the triage vital signs and the nursing notes.   HISTORY  Chief Complaint Shortness of Breath  EM caveat hypoxia, fatigue and generalized weakness  HPI David Duncan is a 86 y.o. male recently discharged from the hospital for community-acquired pneumonia to nursing facility  Patient reports shortness of breath.  Notes to be short of breath low oxygen high heart rate at nursing facility today.  Transported here for further  Patient reports that he feels short of breath.  No other symptoms.  Denies fevers or chills.  No nausea vomiting.  No abdominal pain.  No chest pain.  Shortness of breath.  Does not feel particularly swollen  He was getting ready supposed to go home today worsening shortness of breath   Past Medical History:  Diagnosis Date  . Anxiety   . Arrhythmia   . Atrial fibrillation ([place])   . Basal cell cancer 2008   on shoulder  . Bipolar disorder [facility])    hospitalized in past  . Cardiomyopathy ([place])   . CHF (congestive heart failure) ([place])   . CVA (cerebral vascular accident) ([place]) 8/11  . Depression   . Dyslipidemia   . Generalized anxiety disorder   . GERD (gastroesophageal reflux disease)   . History of kidney stones    30 years ago  . Hypothyroidism   . Macular degeneration   . Melanoma ([place])    resected from scalp approximately 1 year ago.   . Osteoarthritis    in neck and left knee  . Pneumonia 10/30/2019  . Tremor    noted when writing  . Valvular heart disease     Patient Active Problem List   Diagnosis Date Noted  . Acute on chronic respiratory failure with hypoxia ([place]) 12/01/2019  . Abnormal LFTs 11/18/2019  . Hyponatremia 12/11/2019  . CKD (chronic kidney disease), stage IIIb 11/21/2019  . CAP (community acquired  pneumonia) 10/30/2019  . Heartburn 08/07/2018  . Neurogenic claudication 10/05/2017  . Preoperative examination 09/26/2017  . H/O falling 05/02/2017  . Elevated serum creatinine 05/02/2017  . Depression 11/12/2016  . Right shoulder injury 05/26/2016  . Pain in joint, shoulder region 04/02/2016  . BPH associated with nocturia 02/17/2016  . Mass in the abdomen 02/09/2016  . Disequilibrium 01/29/2016  . Unsteadiness 01/29/2016  . Family history of prostate cancer 01/07/2016  . Hypothyroidism 01/07/2016  . S/P total knee arthroplasty 12/03/2015  . Routine general medical examination at a health care facility 08/08/2015  . Spinal stenosis in cervical region 02/05/2015  . Cervical spinal stenosis 02/02/2015  . Left shoulder pain 12/06/2014  . Cardiomyopathy, dilated ([place]) 09/16/2014  . Cerebral vascular accident ([place]) 09/16/2014  . Heart valve disease 09/16/2014  . MI (mitral incompetence) 08/08/2014  . TI (tricuspid incompetence) 08/08/2014  . Atrial fibrillation with RVR ([place]) 02/15/2014  . Atrial fibrillation, chronic ([place]) 01/16/2014  . Chronic systolic heart failure ([place]) 01/16/2014  . Bunion, right foot 08/17/2013  . Macular degeneration 10/16/2012  . Encounter for Medicare annual wellness exam 08/16/2012  . Hearing decreased 12/16/2010  . Medication adverse effect 07/28/2010  . Prostate cancer screening 07/28/2010  . Long term (current) use of anticoagulants 07/22/2010  . Hyperlipidemia 06/01/2010  . ATRIAL FLUTTER 11/28/2009  . Bipolar disorder ([place]) 02/20/2009    Past Surgical History:  Procedure Laterality Date  .  BACK SURGERY  1991   L4-5  . CATARACT EXTRACTION W/ INTRAOCULAR LENS IMPLANT Bilateral   . EYE SURGERY    . JOINT REPLACEMENT    . KNEE ARTHROPLASTY Left 12/03/2015   Procedure: COMPUTER ASSISTED TOTAL KNEE ARTHROPLASTY;  Surgeon: Dereck Leep, MD;  Location: [facility] ORS;  Service: Orthopedics;  Laterality: Left;  . LUMBAR LAMINECTOMY/DECOMPRESSION  MICRODISCECTOMY N/A 10/05/2017   Procedure: LUMBAR LAMINECTOMY/DECOMPRESSION MICRODISCECTOMY 3 LEVELS L2-5, CORPECTOMY;  Surgeon: Meade Maw, MD;  Location: [facility] ORS;  Service: Neurosurgery;  Laterality: N/A;  . SKIN CANCER EXCISION     melenoma on scalp    Prior to Admission medications   Medication Sig Start Date End Date Taking? Authorizing Provider  acetaminophen (TYLENOL) 325 MG tablet Take 650 mg by mouth every morning. 650 mg every morning    [provider]  Amino Acids (FREE FORM AMINO ACID COMPLEX PO) Take 2 capsules by mouth 2 (two) times daily.     [provider]  Apoaequorin (PREVAGEN) 10 MG CAPS Take 1 capsule by mouth daily.     [provider]  Ascorbic Acid (VITAMIN C) 500 MG tablet Take 500 mg by mouth daily.     [provider]  B Complex Vitamins (B COMPLEX PO) Take 1 tablet by mouth daily.     [provider]  Bismuth Subsalicylate (KAOPECTATE PO) Take by mouth. 2 tablets every morning    [provider]  Calcium-Magnesium-Vitamin D (CALCIUM MAGNESIUM PO) Take by mouth at bedtime. Calcium/magnesium/poatssium supplement    [provider]  Cholecalciferol (VITAMIN D3) 125 MCG (5000 UT) CAPS Take 5,000 Units by mouth daily.    [provider]  Cyanocobalamin (VITAMIN B-12) 2500 MCG SUBL Place 1 tablet under the tongue daily.     [provider]  escitalopram (LEXAPRO) 10 MG tablet Take 1 tablet (10 mg total) by mouth daily. Patient not taking: Reported on 10/30/2019 08/07/18   Tower, Wynelle Fanny, MD  furosemide (LASIX) 20 MG tablet Take 20 mg by mouth daily.    [provider]  [place], Centella asiatica, (GOTU KOLA PO) Take 950 mg by mouth. 1 twice daily    [provider]  Levomefolate Glucosamine (METHYLFOLATE PO) Take 1 tablet by mouth daily.    [provider]  Lutein 20 MG CAPS Take 20 mg by mouth in the morning and at bedtime.    [provider]    Lysine 500 MG CAPS Take 1 capsule by mouth 2 (two) times daily.    [provider]  MAGNESIUM-ZINC PO Take by mouth daily.    [provider]  MELATONIN PO Take 2 tablets by mouth at bedtime. Restful Sleep by [facility] : Hops Extract 4:1 (Humulus lupulus) (flower) Chamomile (Matricaria chamomilla) (flower) Passion Flower Extract (standardized to 4% vitexin) (Passiflora incarnate) (flower) 5 HTP (5-Hydroxtryptophan) (Griffonia simplicifolia) (seed)    [provider]  metoprolol succinate (TOPROL-XL) 100 MG 24 hr tablet Take 1 tablet (100 mg total) by mouth daily. Take with or immediately following a meal. 01/04/17 11/15/19  Alisa Graff, FNP  Misc Natural Products (CHOLESTEROL SUPPORT PO) Take by mouth. Cholest [place] - 3 at supper time    [provider]  Multiple Vitamins-Minerals (PRESERVISION AREDS 2) CAPS Take 1 capsule by mouth 2 (two) times daily.    [provider]  polyethylene glycol (MIRALAX / GLYCOLAX) packet Take 17 g by mouth daily. Patient not taking: Reported on 10/30/2019 11/23/17   Gladstone Lighter, MD  RESVERATROL PO Take by mouth daily.    [provider]  sacubitril-valsartan (ENTRESTO) 24-26 MG Take 1 tablet by mouth 2 (two) times daily.    [provider]  Specialty Vitamins Products (PROSTATE PO) Take 1 tablet by mouth daily. Saw Palmetto    [provider]  Turmeric (QC TUMERIC COMPLEX PO) Take 400 mg by mouth daily.    [provider]  venlafaxine XR (EFFEXOR-XR) 75 MG 24 hr capsule Take 75 mg by mouth every morning. 10/19/19   [provider]  warfarin (COUMADIN) 2 MG tablet Take 2 mg by mouth daily. 06/04/19   [provider]  warfarin (COUMADIN) 3 MG tablet Take 1 tablet (3 mg total) by mouth daily. Patient not taking: Reported on 10/30/2019 11/22/17   Gladstone Lighter, MD    Allergies Baclofen  Family History  Problem Relation Age of Onset  . Stroke Father   . Heart  attack Father   . Cancer Brother        prostate  . Coronary artery disease Brother   . Hypertension Brother     Social History Social History   Tobacco Use  . Smoking status: Never Smoker  . Smokeless tobacco: Never Used  Vaping Use  . Vaping Use: Never used  Substance Use Topics  . Alcohol use: No    Alcohol/week: 0.0 standard drinks  . Drug use: No    Review of Systems Constitutional: No fever/chills Eyes: No visual changes. ENT: No sore throat. Cardiovascular: Denies chest pain. Respiratory: Moderate shortness of breath Gastrointestinal: No abdominal pain.   Skin: No noted swelling Neurological: Negative for headaches, chronically weak.    ____________________________________________   PHYSICAL EXAM:  VITAL SIGNS: ED Triage Vitals [11/11/2019 1105]  Enc Vitals Group     BP 119/78     Pulse Rate (!) 132     Resp 18     Temp      Temp src      SpO2 (!) 81 %     Weight      Height      Head Circumference      Peak Flow      Pain Score      Pain Loc      Pain Edu?      Excl. in [place]?     Constitutional: Alert and oriented to self and situation.  Chronically ill-appearing and mildly dyspneic and slightly pale Eyes: Conjunctivae are normal. Head: Atraumatic. Nose: No congestion/rhinnorhea. Mouth/Throat: Mucous membranes are quite dry. Neck: No stridor.  Cardiovascular: Tachycardic with irregular rhythm. Grossly normal heart sounds.  Good peripheral circulation. Respiratory: Moderate use of accessory muscles.  Moderate increased work of breathing.  Diminished lung sounds throughout, faint crackles noted in all lung fields.  Speaks in 2-3 word phrases.  Noted to be hypoxic about 80% on 4 L nasal cannula Gastrointestinal: Soft and nontender. No distention. Musculoskeletal: No lower extremity tenderness with trace lower extremity edema Neurologic:  Normal speech and language. No gross focal neurologic deficits are appreciated.  Skin:  Skin is warm, dry and  intact. No rash noted. Psychiatric: Mood and affect are normal. Speech and behavior are normal.  ____________________________________________   LABS (all labs ordered are listed, but only abnormal results are displayed)  Labs Reviewed  LACTIC ACID, PLASMA - Abnormal; Notable for the following components:      Result Value   Lactic Acid, Venous 3.5 (*)    All other components within normal limits  COMPREHENSIVE  METABOLIC PANEL - Abnormal; Notable for the following components:   Sodium 128 (*)    Chloride 96 (*)    CO2 18 (*)    Glucose, Bld 103 (*)    BUN 41 (*)    Creatinine, Ser 1.89 (*)    Calcium 8.8 (*)    Albumin 3.2 (*)    AST 287 (*)    ALT 162 (*)    Total Bilirubin 1.5 (*)    GFR calc non Af Amer 31 (*)    GFR calc Af Amer 36 (*)    All other components within normal limits  CBC WITH DIFFERENTIAL/PLATELET - Abnormal; Notable for the following components:   WBC 11.9 (*)    RBC 3.74 (*)    Hemoglobin 12.6 (*)    HCT 37.4 (*)    nRBC 0.3 (*)    Neutro Abs 10.2 (*)    All other components within normal limits  PROTIME-INR - Abnormal; Notable for the following components:   Prothrombin Time >90.0 (*)    INR >10.0 (*)    All other components within normal limits  APTT - Abnormal; Notable for the following components:   aPTT 127 (*)    All other components within normal limits  BRAIN NATRIURETIC PEPTIDE - Abnormal; Notable for the following components:   B Natriuretic Peptide 1,388.7 (*)    All other components within normal limits  BLOOD GAS, VENOUS - Abnormal; Notable for the following components:   pCO2, Ven 43 (*)    Acid-base deficit 6.4 (*)    All other components within normal limits  FIBRIN DERIVATIVES D-DIMER ([facility] ONLY) - Abnormal; Notable for the following components:   Fibrin derivatives D-dimer [facility]) 2,816.98 (*)    All other components within normal limits  CULTURE, BLOOD (SINGLE)  URINE CULTURE  SARS CORONAVIRUS 2 BY RT PCR (HOSPITAL ORDER,  [place] [facility])  CULTURE, BLOOD (SINGLE)  LACTIC ACID, PLASMA  URINALYSIS, COMPLETE ([facility]) WITH MICROSCOPIC   ____________________________________________  EKG  Reviewed interpreted me at 1050 Heart rate 130 QRS 80 QTc 470 Atrial fibrillation, mild nonspecific T wave abnormality.  No STEMI. ____________________________________________  RADIOLOGY  DG Chest Portable 1 View  Result Date: 11/27/2019 CLINICAL DATA:  Worsening shortness of breath, pneumonia EXAM: PORTABLE CHEST 1 VIEW COMPARISON:  10/30/2019 FINDINGS: Multifocal patchy opacities, subpleural/peripheral and lower lobe predominant, compatible with multifocal pneumonia. No pleural effusion or pneumothorax. Cardiomegaly. IMPRESSION: Multifocal pneumonia, new/progressive. Atypical/viral etiologies including COVID are possible. Electronically Signed   By: Julian Hy M.D.   On: 12/03/2019 11:42    Chest x-ray personally viewed by me, concerning for diffuse multifocal patchy opacities ____________________________________________   PROCEDURES  Procedure(s) performed: None  Procedures  Critical Care performed: Yes, see critical care note(s)  CRITICAL CARE Performed by: Delman Kitten   Total critical care time: 30 minutes  Critical care time was exclusive of separately billable procedures and treating other patients.  Critical care was necessary to treat or prevent imminent or life-threatening deterioration.  Critical care was time spent personally by me on the following activities: development of treatment plan with patient and/or surrogate as well as nursing, discussions with consultants, evaluation of patient's response to treatment, examination of patient, obtaining history from patient or surrogate, ordering and performing treatments and interventions, ordering and review of laboratory studies, ordering and review of radiographic studies, pulse oximetry and re-evaluation of patient's  condition.  ____________________________________________   INITIAL IMPRESSION / ASSESSMENT AND PLAN / ED COURSE  Pertinent labs & imaging results that were available during my care of the patient were reviewed by me and considered in my medical decision making (see chart for details).     Clinical Course as of Nov 20 1253  Wed Nov 21, 2019  1111 Patient has MOST form indicating do not attempt resuscitation.  Discussed with the patient, he confirms that he does not wish to be resuscitated, and if it is nearing his time potential death he would like to be admitted focused on comfort.  He does not wish to be on a ventilator or have a breathing tube.  He is DO NOT RESUSCITATE DO NOT INTUBATE.   [MQ]  1205 Admit discussed with Dr. Blaine Hamper   [MQ]    Clinical Course User Index [MQ] Delman Kitten, MD   ----------------------------------------- 11:48 AM on 12/01/2019 -----------------------------------------  Upon review of the patient's recent clinical history including recent pneumonia, worsening status, tachycardia A. fib, and review of his chest x-ray and lab work I am very concerned about progressive pneumonia possibly healthcare associated.  Covered broadly.  Await Covid testing.  He has been clear with his goals of care which include DO NOT RESUSCITATE.  Will trial high flow nasal cannula for relief of dyspnea and improvement in oxygenation.  Nonrebreather saturations have improved to the high 90s, but work of breathing remains elevated somewhat.  Paged discussed case with hospitalist Dr. Blaine Hamper  ----------------------------------------- 12:55 PM on 11/14/2019 -----------------------------------------  Single episode of hypotension with systolic blood pressure below 90, elevated lactic acid.  Concerning for infection.  He is however afebrile, certainly edema could have a somewhat similar presentation and his BNP is elevated, however his recent diagnosis of community-acquired pneumonia as well  as his chest x-ray to me seems most suggestive of multifocal pneumonia.  Covering broadly.  Giving small fluid boluses reassessing, heart rate is improving and his blood pressure is normal at this time.  Heart rate currently 120, remains in A. fib.  Rate does appear to be improving with treatments.  INR elevated, but no signs or symptoms of acute bleeding did noted, notifed admitting of INR > 10  ----------------------------------------- 12:57 PM on 11/12/2019 -----------------------------------------  Sepsis - Repeat Assessment  Performed at:    1240  Vitals     Blood pressure 119/78, pulse (!) 132, temperature 98.3 F (36.8 C), temperature source Oral, resp. rate 18, height 5\' 6"  (1.676 m), weight 75.9 kg, SpO2 95 %.  Heart:     Irregular rate and rhythm  Lungs:    Rhonchi  Capillary Refill:   <2 sec  Peripheral Pulse:   Radial pulse palpable  Skin:     Pale     ____________________________________________   FINAL CLINICAL IMPRESSION(S) / ED DIAGNOSES  Final diagnoses:  Elevated INR  Multifocal pneumonia  Sepsis, due to unspecified organism, unspecified whether acute organ dysfunction present ([place])  Hyponatremia  Atrial fibrillation with RVR ([place])        Note:  This document was prepared using Dragon voice recognition software and may include unintentional dictation errors       Delman Kitten, MD 11/21/19 1257

## 2019-11-21 NOTE — H&P (Addendum)
History and Physical    David Duncan VOZ:[id number] DOB: [birth year] DOA: 11/16/2019  Referring MD/NP/PA:   PCP: Abner Greenspan, MD   Patient coming from:  The patient is coming from SNF.  At baseline, pt is dependent for most of ADL.        Chief Complaint: SOB and AMS  HPI: David Duncan is a 86 y.o. male with medical history significant of stroke, GERD, hypothyroidism, depression, anxiety, atrial fibrillation on Coumadin, sCHF with EF 35%, bipolar disorder, BPH, tricuspid valve incompetence, CKD-3B, who presents with shortness breath and altered mental status.  Patient was recently hospitalized from 7/19-7/22 due to CAP. Pt was discharged to SNF.  He was noted to have worsening shortness of breath at SNF. At his baseline, patient uses 2 L oxygen, currently patient has oxygen desaturation to 81% on home level oxygen.  Patient has dry cough and does not seem to have chest pain.  No active nausea vomiting, diarrhea noted.  Patient is more confused than his baseline. He moves all extremities.  No facial droop or slurred speech.  ED Course: pt was found to have WBC 11.9, lactic acid 3.5, INR >10, BNP 1388, negative COVID-19 PCR, D-dimer 2816, renal function close to baseline, sodium 128, abnormal liver function (ALP 115, AST 287, ALT 162, total bilirubin 1.5), CXR showed multifocal infiltration.  Patient is admitted to stepdown as inpatient.  Review of Systems: Could not be reviewed due to altered mental status    Allergy:  Allergies  Allergen Reactions  . Baclofen     Confusion/delerium    Past Medical History:  Diagnosis Date  . Anxiety   . Arrhythmia   . Atrial fibrillation ([place])   . Basal cell cancer 2008   on shoulder  . Bipolar disorder [facility])    hospitalized in past  . Cardiomyopathy ([place])   . CHF (congestive heart failure) ([place])   . CVA (cerebral vascular accident) ([place]) 8/11  . Depression   . Dyslipidemia   . Generalized anxiety disorder   . GERD  (gastroesophageal reflux disease)   . History of kidney stones    30 years ago  . Hypothyroidism   . Macular degeneration   . Melanoma ([place])    resected from scalp approximately 1 year ago.   . Osteoarthritis    in neck and left knee  . Pneumonia 10/30/2019  . Tremor    noted when writing  . Valvular heart disease     Past Surgical History:  Procedure Laterality Date  . BACK SURGERY  1991   L4-5  . CATARACT EXTRACTION W/ INTRAOCULAR LENS IMPLANT Bilateral   . EYE SURGERY    . JOINT REPLACEMENT    . KNEE ARTHROPLASTY Left 12/03/2015   Procedure: COMPUTER ASSISTED TOTAL KNEE ARTHROPLASTY;  Surgeon: Dereck Leep, MD;  Location: [facility] ORS;  Service: Orthopedics;  Laterality: Left;  . LUMBAR LAMINECTOMY/DECOMPRESSION MICRODISCECTOMY N/A 10/05/2017   Procedure: LUMBAR LAMINECTOMY/DECOMPRESSION MICRODISCECTOMY 3 LEVELS L2-5, CORPECTOMY;  Surgeon: Meade Maw, MD;  Location: [facility] ORS;  Service: Neurosurgery;  Laterality: N/A;  . SKIN CANCER EXCISION     melenoma on scalp    Social History:  reports that he has never smoked. He has never used smokeless tobacco. He reports that he does not drink alcohol and does not use drugs.  Family History:  Family History  Problem Relation Age of Onset  . Stroke Father   . Heart attack Father   . Cancer Brother  prostate  . Coronary artery disease Brother   . Hypertension Brother      Prior to Admission medications   Medication Sig Start Date End Date Taking? Authorizing Provider  acetaminophen (TYLENOL) 325 MG tablet Take 650 mg by mouth every morning. 650 mg every morning    [provider]  Amino Acids (FREE FORM AMINO ACID COMPLEX PO) Take 2 capsules by mouth 2 (two) times daily.     [provider]  Apoaequorin (PREVAGEN) 10 MG CAPS Take 1 capsule by mouth daily.     [provider]  Ascorbic Acid (VITAMIN C) 500 MG tablet Take 500 mg by mouth daily.     [provider]  B Complex  Vitamins (B COMPLEX PO) Take 1 tablet by mouth daily.     [provider]  Bismuth Subsalicylate (KAOPECTATE PO) Take by mouth. 2 tablets every morning    [provider]  Calcium-Magnesium-Vitamin D (CALCIUM MAGNESIUM PO) Take by mouth at bedtime. Calcium/magnesium/poatssium supplement    [provider]  Cholecalciferol (VITAMIN D3) 125 MCG (5000 UT) CAPS Take 5,000 Units by mouth daily.    [provider]  Cyanocobalamin (VITAMIN B-12) 2500 MCG SUBL Place 1 tablet under the tongue daily.     [provider]  escitalopram (LEXAPRO) 10 MG tablet Take 1 tablet (10 mg total) by mouth daily. Patient not taking: Reported on 10/30/2019 08/07/18   Tower, Wynelle Fanny, MD  furosemide (LASIX) 20 MG tablet Take 20 mg by mouth daily.    [provider]  [place], Centella asiatica, (GOTU KOLA PO) Take 950 mg by mouth. 1 twice daily    [provider]  Levomefolate Glucosamine (METHYLFOLATE PO) Take 1 tablet by mouth daily.    [provider]  Lutein 20 MG CAPS Take 20 mg by mouth in the morning and at bedtime.    [provider]  Lysine 500 MG CAPS Take 1 capsule by mouth 2 (two) times daily.    [provider]  MAGNESIUM-ZINC PO Take by mouth daily.    [provider]  MELATONIN PO Take 2 tablets by mouth at bedtime. Restful Sleep by [facility] : Hops Extract 4:1 (Humulus lupulus) (flower) Chamomile (Matricaria chamomilla) (flower) Passion Flower Extract (standardized to 4% vitexin) (Passiflora incarnate) (flower) 5 HTP (5-Hydroxtryptophan) (Griffonia simplicifolia) (seed)    [provider]  metoprolol succinate (TOPROL-XL) 100 MG 24 hr tablet Take 1 tablet (100 mg total) by mouth daily. Take with or immediately following a meal. 01/04/17 11/15/19  Alisa Graff, FNP  Misc Natural Products (CHOLESTEROL SUPPORT PO) Take by mouth. Cholest [place] - 3 at supper time    [provider]  Multiple  Vitamins-Minerals (PRESERVISION AREDS 2) CAPS Take 1 capsule by mouth 2 (two) times daily.    [provider]  polyethylene glycol (MIRALAX / GLYCOLAX) packet Take 17 g by mouth daily. Patient not taking: Reported on 10/30/2019 11/23/17   Gladstone Lighter, MD  RESVERATROL PO Take by mouth daily.    [provider]  sacubitril-valsartan (ENTRESTO) 24-26 MG Take 1 tablet by mouth 2 (two) times daily.    [provider]  Specialty Vitamins Products (PROSTATE PO) Take 1 tablet by mouth daily. Saw Palmetto    [provider]  Turmeric (QC TUMERIC COMPLEX PO) Take 400 mg by mouth daily.    [provider]  venlafaxine XR (EFFEXOR-XR) 75 MG 24 hr capsule Take 75 mg by mouth every morning. 10/19/19  [provider]  warfarin (COUMADIN) 2 MG tablet Take 2 mg by mouth daily. 06/04/19   [provider]  warfarin (COUMADIN) 3 MG tablet Take 1 tablet (3 mg total) by mouth daily. Patient not taking: Reported on 10/30/2019 11/22/17   Gladstone Lighter, MD    Physical Exam: Vitals:   11/28/2019 1501 12/05/2019 1530 11/11/2019 1615 12/03/2019 1642  BP:  116/88 121/64 (!) 110/93  Pulse:  (!) 111 (!) 123 (!) 123  Resp:    (!) 32  Temp:      TempSrc:      SpO2: 93% 93% 94% 94%  Weight:      Height:       General: Not in acute distress HEENT:       Eyes: PERRL, EOMI, no scleral icterus.       ENT: No discharge from the ears and nose, no pharynx injection, no tonsillar enlargement.        Neck: No JVD, no bruit, no mass felt. Heme: No neck lymph node enlargement. Cardiac: S1/S2, RRR, no gallops or rubs. Respiratory: Has crackles bilaterally  GI: Soft, nondistended, nontender, no rebound pain, no organomegaly, BS present. GU: No hematuria Ext: has trace leg edema bilaterally. 1+DP/PT pulse bilaterally. Musculoskeletal: No joint deformities, No joint redness or warmth, no limitation of ROM in spin. Skin: No rashes.  Neuro: confused, knows his own  name, but not oriented to place and time. Cranial nerves II-XII grossly intact, moves all extremities. Psych: Patient is not psychotic, no suicidal or hemocidal ideation.  Labs on Admission: I have personally reviewed following labs and imaging studies  CBC: Recent Labs  Lab 11/20/2019 1055  WBC 11.9*  NEUTROABS 10.2*  HGB 12.6*  HCT 37.4*  MCV 100.0  PLT 342   Basic Metabolic Panel: Recent Labs  Lab 11/24/2019 1055  NA 128*  K 4.9  CL 96*  CO2 18*  GLUCOSE 103*  BUN 41*  CREATININE 1.89*  CALCIUM 8.8*   GFR: Estimated Creatinine Clearance: 24.8 mL/min (A) (by C-G formula based on SCr of 1.89 mg/dL (H)). Liver Function Tests: Recent Labs  Lab 11/12/2019 1055  AST 287*  ALT 162*  ALKPHOS 115  BILITOT 1.5*  PROT 7.9  ALBUMIN 3.2*   No results for input(s): LIPASE, AMYLASE in the last 168 hours. Recent Labs  Lab 11/13/2019 1609  AMMONIA 37*   Coagulation Profile: Recent Labs  Lab  1055  INR >10.0*   Cardiac Enzymes: No results for input(s): CKTOTAL, CKMB, CKMBINDEX, TROPONINI in the last 168 hours. BNP (last 3 results) No results for input(s): PROBNP in the last 8760 hours. HbA1C: No results for input(s): HGBA1C in the last 72 hours. CBG: No results for input(s): GLUCAP in the last 168 hours. Lipid Profile: No results for input(s): CHOL, HDL, LDLCALC, TRIG, CHOLHDL, LDLDIRECT in the last 72 hours. Thyroid Function Tests: No results for input(s): TSH, T4TOTAL, FREET4, T3FREE, THYROIDAB in the last 72 hours. Anemia Panel: No results for input(s): VITAMINB12, FOLATE, FERRITIN, TIBC, IRON, RETICCTPCT in the last 72 hours. Urine analysis:    Component Value Date/Time   COLORURINE YELLOW 10/30/2019 1418   APPEARANCEUR CLEAR 10/30/2019 1418   LABSPEC 1.011 10/30/2019 1418   PHURINE 7.0 10/30/2019 1418   GLUCOSEU NEGATIVE 10/30/2019 1418   HGBUR NEGATIVE 10/30/2019 1418   [place] 10/30/2019 1418   [place] 10/30/2019 1418    PROTEINUR NEGATIVE 10/30/2019 1418   UROBILINOGEN 0.2 11/12/2009 0632   NITRITE NEGATIVE 10/30/2019 1418  LEUKOCYTESUR NEGATIVE 10/30/2019 1418   Sepsis Labs: @LABRCNTIP (procalcitonin:4,lacticidven:4) ) Recent Results (from the past 240 hour(s))  SARS Coronavirus 2 by RT PCR (hospital order, performed in [facility] hospital lab) Nasopharyngeal Nasopharyngeal Swab     Status: None   Collection Time: 12/02/2019 11:30 AM   Specimen: Nasopharyngeal Swab  Result Value Ref Range Status   SARS Coronavirus 2 NEGATIVE NEGATIVE Final    Comment: (NOTE) SARS-CoV-2 target nucleic acids are NOT DETECTED.  The SARS-CoV-2 RNA is generally detectable in upper and lower respiratory specimens during the acute phase of infection. The lowest concentration of SARS-CoV-2 viral copies this assay can detect is 250 copies / mL. A negative result does not preclude SARS-CoV-2 infection and should not be used as the sole basis for treatment or other patient management decisions.  A negative result may occur with improper specimen collection / handling, submission of specimen other than nasopharyngeal swab, presence of viral mutation(s) within the areas targeted by this assay, and inadequate number of viral copies (<250 copies / mL). A negative result must be combined with clinical observations, patient history, and epidemiological information.  Fact Sheet for Patients:   StrictlyIdeas.no  Fact Sheet for Healthcare Providers: BankingDealers.co.za  This test is not yet approved or  cleared by the [place] FDA and has been authorized for detection and/or diagnosis of SARS-CoV-2 by FDA under an Emergency Use Authorization (EUA).  This EUA will remain in effect (meaning this test can be used) for the duration of the COVID-19 declaration under Section 564(b)(1) of the Act, 21 U.S.C. section 360bbb-3(b)(1), unless the authorization is terminated or revoked  sooner.  Performed at [facility], [address] [place], [place] [address]      Radiological Exams on Admission: DG Chest Portable 1 View  Result Date: 11/12/2019 CLINICAL DATA:  Worsening shortness of breath, pneumonia EXAM: PORTABLE CHEST 1 VIEW COMPARISON:  10/30/2019 FINDINGS: Multifocal patchy opacities, subpleural/peripheral and lower lobe predominant, compatible with multifocal pneumonia. No pleural effusion or pneumothorax. Cardiomegaly. IMPRESSION: Multifocal pneumonia, new/progressive. Atypical/viral etiologies including COVID are possible. Electronically Signed   By: Julian Hy M.D.   On: 11/11/2019 11:42     EKG: Independently reviewed.  Atrial fibrillation with RVR, QTC 469, low voltage, nonspecific to change  Assessment/Plan Principal Problem:   Acute on chronic respiratory failure with hypoxia (HCC) Active Problems:   Chronic systolic heart failure (HCC)   Atrial fibrillation with RVR (HCC)   Depression   Abnormal LFTs   Hyponatremia   CKD (chronic kidney disease), stage IIIb   Acute metabolic encephalopathy   Multifocal pneumonia   Sepsis (HCC)   Acute on chronic respiratory failure with hypoxia and sepsis: Etiology is not completely clear.  Chest x-ray from multifocal infiltration, patient has mild leukocytosis, but no fever. Patient possibly has multifocal pneumonia/HCAP.patient meets criteria for sepsis with tachycardia and leukocytosis. Lactic acid is elevated at 3.5.   - Will admit to SDU bed as inpt - IV Vancomycin and cefepime - Mucinex for cough  - Bronchodilators - Urine legionella and S. pneumococcal antigen - Follow up blood culture x2, sputum culture  - will get Procalcitonin and trend lactic acid level per sepsis protocol - IVF: 500L of NS bolus in ED, then 50 cc/h. Patient has EF of 35 and has possible CHF exacerbation.  Chronic systolic heart failure ([place]): 2D echo on 06/21/2018 showed EF of 35%.  Patient has trace leg  edema, but BNP is elevated at 1388, indicating possible mild CHF exacerbation.  Due sepsis and elevated lactic acid, will not start diuretics. -Watch volume status closely -Bronchodilators  Atrial fibrillation with RVR ([place]): Patient is on Coumadin with INR>10, no active bleeding. -Give 5 mg of vitamin K orally -Start Cardizem drip -Continue home metoprolol  Depression -Continue Effexor  Abnormal LFTs: Likely due to sepsis -Check hepatitis panel -Avoid using Tylenol -check ammonia level  Hyponatremia: Sodium 128 -IVF: 500 cc NS in ED, then 50 cc/h -Hold Entresto and Lasix -Follow-up of BMP  CKD (chronic kidney disease), stage IIIb -f/u by BMP  Acute metabolic encephalopathy: Likely due to sepsis -Follow-up CT head due to elevated INR>10, to rule out intracranial bleeding -Frequent neuro check -Follow-up urinalysis     DVT ppx: none sine INR>10 Code Status: DNR per his wife Family Communication:  Yes, patient's  Wife   at bed side Disposition Plan:  Anticipate discharge back to previous SNF environment Consults called:  none Admission status:  SDU/inpation       Status is: Inpatient  Remains inpatient appropriate because:Inpatient level of care appropriate due to severity of illness.  Patient has multiple comorbidities, now presents with acute on chronic respiratory failure with likely etiology, but possibly due to combination of CHF and multifocal pneumonia/HCAP.  Patient also has sepsis, abnormal liver function, hyponatremia, atrial fibrillation with RVR, altered mental status.  His presentation is highly complicated.  Patient is at high risk of deteriorating.  Patient will need to be treated in the hospital for at least 2 days.   Dispo: The patient is from: SNF              Anticipated d/c is to: SNF              Anticipated d/c date is: 2 days              Patient currently is not medically stable to d/c.            Date of Service 11/20/2019    Ivor Costa [facility]   If 7PM-7AM, please contact night-coverage [email or web address] 12/05/2019, 5:44 PM

## 2019-11-21 NOTE — Progress Notes (Signed)
PHARMACY -  BRIEF ANTIBIOTIC NOTE   Pharmacy has received consult(s) for Cefepime and vancomycin  from an ED provider.  The patient's profile has been reviewed for ht/wt/allergies/indication/available labs.    One time order(s) placed for cefepime 2g and vancomycin 1000mg . Will order an additional vancomycin 750mg  IV x 1 dose for total loading dose of 1750mg .   Further antibiotics/pharmacy consults should be ordered by admitting physician if indicated.                       Thank you, Pernell Dupre, PharmD, BCPS Clinical Pharmacist 11/27/2019 12:10 PM

## 2019-11-21 NOTE — Progress Notes (Signed)
CODE SEPSIS - PHARMACY COMMUNICATION  **Broad Spectrum Antibiotics should be administered within 1 hour of Sepsis diagnosis**  Time Code Sepsis Called/Page Received: @ 1136  Antibiotics Ordered: Cefepime and vancomycin   Time of 1st antibiotic administration: @ [place], PharmD, BCPS Clinical Pharmacist 12/01/2019 12:12 PM

## 2019-11-21 NOTE — Progress Notes (Signed)
Pharmacy Antibiotic Note  David Duncan is a 86 y.o. male admitted on 11/14/2019. Pharmacy has been consulted for vancomycin and cefepime dosing.  Plan: Vancomycin 750 mg IV q24h to start 8/12 ~ 1600 after 1750 mg loading dose today  Cefepime 2 g IV q24h  Height: 5\' 6"  (167.6 cm) Weight: 75.9 kg (167 lb 5.3 oz) IBW/kg (Calculated) : 63.8  Temp (24hrs), Avg:98.3 F (36.8 C), Min:98.3 F (36.8 C), Max:98.3 F (36.8 C)  Recent Labs  Lab 11/28/2019 1055 11/28/2019 1130  WBC 11.9*  --   CREATININE 1.89*  --   LATICACIDVEN  --  3.5*    Estimated Creatinine Clearance: 24.8 mL/min (A) (by C-G formula based on SCr of 1.89 mg/dL (H)).    Allergies  Allergen Reactions  . Baclofen     Confusion/delerium    Antimicrobials this admission: Vancomycin 8/11 >> Cefepime 8/11 >>   Microbiology results: 8/11 BCx: pending 8/11 UCx: pending  8/11 MRSA PCR: ordered  Thank you for allowing pharmacy to be a part of this patient's care.  Tawnya Crook, PharmD 12/01/2019 3:22 PM

## 2019-11-21 NOTE — ED Notes (Signed)
Report off to sarah rn  

## 2019-11-22 ENCOUNTER — Inpatient Hospital Stay: Payer: PPO

## 2019-11-22 ENCOUNTER — Inpatient Hospital Stay
Admit: 2019-11-22 | Discharge: 2019-11-22 | Disposition: A | Discharge status: Home / Self Care | Payer: PPO | Attending: Internal Medicine | Admitting: Internal Medicine

## 2019-11-22 ENCOUNTER — Encounter: Payer: Self-pay | Admitting: Internal Medicine

## 2019-11-22 DIAGNOSIS — Z7189 Other specified counseling: Secondary | ICD-10-CM | POA: Diagnosis not present

## 2019-11-22 DIAGNOSIS — J9621 Acute and chronic respiratory failure with hypoxia: Secondary | ICD-10-CM | POA: Diagnosis not present

## 2019-11-22 DIAGNOSIS — I2510 Atherosclerotic heart disease of native coronary artery without angina pectoris: Secondary | ICD-10-CM | POA: Diagnosis not present

## 2019-11-22 DIAGNOSIS — R0602 Shortness of breath: Secondary | ICD-10-CM | POA: Diagnosis not present

## 2019-11-22 DIAGNOSIS — G9389 Other specified disorders of brain: Secondary | ICD-10-CM | POA: Diagnosis not present

## 2019-11-22 DIAGNOSIS — J90 Pleural effusion, not elsewhere classified: Secondary | ICD-10-CM | POA: Diagnosis not present

## 2019-11-22 DIAGNOSIS — J189 Pneumonia, unspecified organism: Secondary | ICD-10-CM | POA: Diagnosis not present

## 2019-11-22 DIAGNOSIS — A419 Sepsis, unspecified organism: Principal | ICD-10-CM

## 2019-11-22 DIAGNOSIS — R918 Other nonspecific abnormal finding of lung field: Secondary | ICD-10-CM | POA: Diagnosis not present

## 2019-11-22 DIAGNOSIS — I700 Atherosclerosis of aorta: Secondary | ICD-10-CM | POA: Diagnosis not present

## 2019-11-22 DIAGNOSIS — J0130 Acute sphenoidal sinusitis, unspecified: Secondary | ICD-10-CM | POA: Diagnosis not present

## 2019-11-22 DIAGNOSIS — I5021 Acute systolic (congestive) heart failure: Secondary | ICD-10-CM | POA: Diagnosis not present

## 2019-11-22 DIAGNOSIS — Z515 Encounter for palliative care: Secondary | ICD-10-CM | POA: Insufficient documentation

## 2019-11-22 DIAGNOSIS — I7090 Unspecified atherosclerosis: Secondary | ICD-10-CM | POA: Diagnosis not present

## 2019-11-22 DIAGNOSIS — J320 Chronic maxillary sinusitis: Secondary | ICD-10-CM | POA: Diagnosis not present

## 2019-11-22 LAB — BLOOD CULTURE ID PANEL (REFLEXED) - BCID2

## 2019-11-22 LAB — CBC
HCT: 34.3 % — ABNORMAL LOW (ref 39.0–52.0)
Hemoglobin: 11.8 g/dL — ABNORMAL LOW (ref 13.0–17.0)
MCH: 33.4 pg (ref 26.0–34.0)
MCHC: 34.4 g/dL (ref 30.0–36.0)
MCV: 97.2 fL (ref 80.0–100.0)
Platelets: 227 10*3/uL (ref 150–400)
RBC: 3.53 MIL/uL — ABNORMAL LOW (ref 4.22–5.81)
RDW: 15 % (ref 11.5–15.5)
WBC: 13.6 10*3/uL — ABNORMAL HIGH (ref 4.0–10.5)
nRBC: 0.3 % — ABNORMAL HIGH (ref 0.0–0.2)

## 2019-11-22 LAB — BASIC METABOLIC PANEL
Anion gap: 17 — ABNORMAL HIGH (ref 5–15)
BUN: 50 mg/dL — ABNORMAL HIGH (ref 8–23)
CO2: 16 mmol/L — ABNORMAL LOW (ref 22–32)
Calcium: 8.9 mg/dL (ref 8.9–10.3)
Chloride: 97 mmol/L — ABNORMAL LOW (ref 98–111)
Creatinine, Ser: 1.7 mg/dL — ABNORMAL HIGH (ref 0.61–1.24)
GFR calc Af Amer: 41 mL/min — ABNORMAL LOW (ref >60)
GFR calc non Af Amer: 35 mL/min — ABNORMAL LOW (ref >60)
Glucose, Bld: 104 mg/dL — ABNORMAL HIGH (ref 70–99)
Potassium: 4.5 mmol/L (ref 3.5–5.1)
Sodium: 130 mmol/L — ABNORMAL LOW (ref 135–145)

## 2019-11-22 LAB — URINALYSIS, COMPLETE (UACMP) WITH MICROSCOPIC
Bilirubin Urine: NEGATIVE
Glucose, UA: NEGATIVE mg/dL
Hgb urine dipstick: NEGATIVE
Ketones, ur: 5 mg/dL — AB
Leukocytes,Ua: NEGATIVE
Nitrite: NEGATIVE
Protein, ur: 100 mg/dL — AB
Specific Gravity, Urine: 1.023 (ref 1.005–1.030)
Squamous Epithelial / HPF: NONE SEEN (ref 0–5)
pH: 5 (ref 5.0–8.0)

## 2019-11-22 LAB — ECHOCARDIOGRAM COMPLETE
AR max vel: 0.97 cm2
AV Area VTI: 0.87 cm2
AV Area mean vel: 0.94 cm2
AV Mean grad: 4 mmHg
AV Peak grad: 7 mmHg
Ao pk vel: 1.32 m/s
Area-P 1/2: 3.92 cm2
Height: 66 in
S' Lateral: 4.12 cm
Weight: 2677.27 oz

## 2019-11-22 LAB — PROTIME-INR
INR: >10 (ref 0.8–1.2)
Prothrombin Time: >90 seconds — ABNORMAL HIGH (ref 11.4–15.2)

## 2019-11-22 LAB — PROCALCITONIN: Procalcitonin: 0.53 ng/mL

## 2019-11-22 LAB — MRSA PCR SCREENING: MRSA by PCR: NEGATIVE

## 2019-11-22 LAB — STREP PNEUMONIAE URINARY ANTIGEN: Strep Pneumo Urinary Antigen: NEGATIVE

## 2019-11-22 LAB — LACTIC ACID, PLASMA: Lactic Acid, Venous: 3.1 mmol/L (ref 0.5–1.9)

## 2019-11-22 MED ORDER — MORPHINE SULFATE (PF) 2 MG/ML IV SOLN: 2.0000 mg | Freq: Once | INTRAVENOUS | Status: AC

## 2019-11-22 MED ORDER — METHYLPREDNISOLONE SODIUM SUCC 40 MG IJ SOLR
40.0000 mg | Freq: Every day | INTRAMUSCULAR | Status: DC
  Administered 2019-11-22: 40 mg via INTRAVENOUS
  Filled 2019-11-22: qty 1

## 2019-11-22 MED ORDER — VITAMIN K1 10 MG/ML IJ SOLN
10.0000 mg | Freq: Once | INTRAMUSCULAR | Status: AC
  Administered 2019-11-22: 10 mg via SUBCUTANEOUS
  Filled 2019-11-22: qty 1

## 2019-11-22 MED ORDER — MORPHINE SULFATE (PF) 2 MG/ML IV SOLN
INTRAVENOUS | Status: AC
  Administered 2019-11-22: 2 mg via INTRAVENOUS
  Filled 2019-11-22: qty 1

## 2019-11-22 MED ORDER — FUROSEMIDE 10 MG/ML IJ SOLN
40.0000 mg | Freq: Once | INTRAMUSCULAR | Status: AC
  Administered 2019-11-22: 40 mg via INTRAVENOUS
  Filled 2019-11-22: qty 4

## 2019-11-22 MED ORDER — BISMUTH SUBSALICYLATE 262 MG/15ML PO SUSP
30.0000 mL | Freq: Four times a day (QID) | ORAL | Status: DC | PRN
  Filled 2019-11-22: qty 118

## 2019-11-23 LAB — CULTURE, BLOOD (SINGLE): Special Requests: ADEQUATE

## 2019-11-23 LAB — RESPIRATORY PANEL BY PCR

## 2019-11-23 LAB — LEGIONELLA PNEUMOPHILA SEROGP 1 UR AG: L. pneumophila Serogp 1 Ur Ag: NEGATIVE

## 2019-11-25 LAB — URINE CULTURE: Culture: 60000 — AB

## 2019-11-26 LAB — CULTURE, BLOOD (SINGLE)
Culture: NO GROWTH
Special Requests: ADEQUATE

## 2019-12-12 NOTE — Consult Note (Signed)
Consultation Note Date: November 27, 2019   Patient Name: David Duncan  DOB: 01-14-1931  MRN: [id number]  Age / Sex: 86 y.o., male  PCP: Duncan, David Fanny, MD Referring Physician: Barb Merino, MD  Reason for Consultation: Establishing goals of care and Psychosocial/spiritual support  HPI/Patient Profile: 86 y.o. male  with past medical history of A. fib on Coumadin, heart failure EF 35%, CKD 3,, history of CVA, dyslipidemia, generalized anxiety/depression-bipolar disorder (hospitalized in past), valvular heart disease, melanoma, osteoarthritis, basal cell on shoulder, back surgery L4/5 in 1991, cataract surgery, admitted on 11/12/2019 with acute on chronic respiratory failure with hypoxia, recently treated for pneumonia from 7/19-7/22 with discharge to short-term rehab.  He is from home with his wife, David Duncan.  Clinical Assessment and Goals of Care:  I have reviewed medical records including EPIC notes, labs and imaging, examined the patient and met at bedside with wife David Duncan to discuss diagnosis prognosis, [place], EOL wishes, disposition and options.  I introduced Palliative Medicine as specialized medical care for people living with serious illness. It focuses on providing relief from the symptoms and stress of a serious illness.   We discussed a brief life review of the patient.  David Duncan has been married to his wife married for 33 years.  He has 4 daughters from his previous marriage.  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  We talked about what gives David Duncan pleasure in life.  She states that he can no longer read, even large print books.  She states that he has no hobbies or friends, but does enjoy evening television.  As far as functional and nutritional status, David Duncan has been needing assistance with ADLs from his wife.  No  weight loss.  David Duncan shares that David Duncan had been sleeping about 10 hours a night, but would still nap for 1 to 2 hours every day.  He used a rolling walker for ambulation.  David Duncan became ill with pneumonia July 19 of this year and was hospitalized for several days.  He went to a local rehab and did okay for about 2 weeks, but then started to decline again.  David Duncan shares that he was not wanting to sit up in the chair as much as he should have.  We discussed current illness and what it means in the larger context of on-going co-morbidities.  Natural disease trajectory and expectations at EOL were discussed.  We talked about weakness and frailty and David Duncan's desire to participate in care.  We talked about the importance of turn cough deep breathe, mobility.  We talked about the treatment plan in detail including, but not limited to IV fluids, IV antibiotics, other medications, selected labs, respiratory treatments and modalities.  We talked about "time for outcomes".  I encourage David Duncan to look for "meaningful improvements".  I share my concern that if David Duncan has not improved by 48 hours, he may not show meaningful  improvement.  David Duncan shares her concern about David Duncan's confusion.  We talked about what is normal and expected with acute illness.  I reassure her that we need time for outcomes, things may be able to improve.  The difference between aggressive medical intervention and comfort care was considered in light of the patient's goals of care.   Advanced directives, concepts specific to code status, were considered and discussed.  David Duncan endorses DNR status for both of them.  She states that they are both born again Christian's and are not afraid of death.  Questions and concerns were addressed.  The family was encouraged to call with questions or concerns.   Conference with attending, bedside nursing staff, transition of care team, respiratory therapy related to patient condition,  needs, goals of care.  Palliative medicine team to continue to follow.   HCPOA   HCPOA -wife of 47 years, David Duncan.  David Duncan has 4 adult daughters from his previous marriage who are involved in his care.    SUMMARY OF RECOMMENDATIONS   At this point continue to treat the treatable but no CPR or intubation. Time for outcomes.   Code Status/Advance Care Planning:  DNR  Symptom Management:   Per hospitalist, no additional needs at this time.  Palliative Prophylaxis:   Frequent Pain Assessment, Oral Care and Turn Reposition  Additional Recommendations (Limitations, Scope, Preferences):  Continue to treat the treatable but no CPR or intubation  Psycho-social/Spiritual:   Desire for further Chaplaincy support:no  Additional Recommendations: Caregiving  Support/Resources and Education on Hospice  Prognosis:   Unable to determine, based on outcomes.  Guarded due to chronic illness burden, advanced age, severity of illness.  Discharge Planning: To be determined, based on outcomes      Primary Diagnoses: Present on Admission: . Acute on chronic respiratory failure with hypoxia ([place]) . Atrial fibrillation with RVR ([place]) . Chronic systolic heart failure ([place]) . Depression . Abnormal LFTs . Hyponatremia . CKD (chronic Duncan disease), stage IIIb . Acute metabolic encephalopathy . Multifocal pneumonia . Sepsis ([place])   I have reviewed the medical record, interviewed the patient and family, and examined the patient. The following aspects are pertinent.  Past Medical History:  Diagnosis Date  . Anxiety   . Arrhythmia   . Atrial fibrillation ([place])   . Basal cell cancer 2008   on shoulder  . Bipolar disorder [facility])    hospitalized in past  . Cardiomyopathy ([place])   . CHF (congestive heart failure) ([place])   . CVA (cerebral vascular accident) ([place]) 8/11  . Depression   . Dyslipidemia   . Generalized anxiety disorder   . GERD (gastroesophageal reflux disease)    . History of Duncan stones    30 years ago  . Hypothyroidism   . Macular degeneration   . Melanoma ([place])    resected from scalp approximately 1 year ago.   . Osteoarthritis    in neck and left knee  . Pneumonia 10/30/2019  . Tremor    noted when writing  . Valvular heart disease    Social History   Socioeconomic History  . Marital status: Married    Spouse name: Not on file  . Number of children: Not on file  . Years of education: Not on file  . Highest education level: Not on file  Occupational History  . Occupation: retired    Comment: used to work on farm, Press photographer, Cabin crew  Tobacco Use  . Smoking status: Never Smoker  .  Smokeless tobacco: Never Used  Vaping Use  . Vaping Use: Never used  Substance and Sexual Activity  . Alcohol use: No    Alcohol/week: 0.0 standard drinks  . Drug use: No  . Sexual activity: Not Currently    Birth control/protection: None  Other Topics Concern  . Not on file  Social History Narrative   Moved from [place] 3/10   Walks 30-40 minutes daily   Social Determinants of Health   Financial Resource Strain:   . Difficulty of Paying Living Expenses:   Food Insecurity:   . Worried About Charity fundraiser in the Last Year:   . Arboriculturist in the Last Year:   Transportation Needs:   . Film/video editor (Medical):   [name] Kitchen Lack of Transportation (Non-Medical):   Physical Activity:   . Days of Exercise per Week:   . Minutes of Exercise per Session:   Stress:   . Feeling of Stress :   Social Connections:   . Frequency of Communication with Friends and Family:   . Frequency of Social Gatherings with Friends and Family:   . Attends Religious Services:   . Active Member of Clubs or Organizations:   . Attends Archivist Meetings:   [name] Kitchen Marital Status:    Family History  Problem Relation Age of Onset  . Stroke Father   . Heart attack Father   . Cancer Brother        prostate  . Coronary artery disease Brother   .  Hypertension Brother    Scheduled Meds: . albuterol  5 mg Nebulization Once  . vitamin C  500 mg Oral Daily  . ipratropium  0.5 mg Nebulization Q4H  . melatonin  5 mg Oral QHS  . metoprolol succinate  100 mg Oral Daily  . multivitamin  1 tablet Oral Daily  . multivitamin-lutein  1 capsule Oral BID  . polyethylene glycol  17 g Oral Daily  . venlafaxine XR  75 mg Oral q morning - 10a  . vitamin B-12  1,000 mcg Oral Daily   Continuous Infusions: . ceFEPime (MAXIPIME) IV Stopped (12/09/19 1218)  . vancomycin     PRN Meds:.albuterol, bismuth subsalicylate, dextromethorphan-guaiFENesin, ondansetron (ZOFRAN) IV Medications Prior to Admission:  Prior to Admission medications   Medication Sig Start Date End Date Taking? Authorizing Provider  acetaminophen (TYLENOL) 500 MG tablet Take 1,000 mg by mouth every 6 (six) hours as needed.   Yes [provider]  Amino Acids (FREE FORM AMINO ACID COMPLEX PO) Take 2 capsules by mouth 2 (two) times daily.    Yes [provider]  Apoaequorin (PREVAGEN) 10 MG CAPS Take 1 capsule by mouth daily.    Yes [provider]  Ascorbic Acid (VITAMIN C) 500 MG tablet Take 500 mg by mouth daily.    Yes [provider]  B Complex Vitamins (B COMPLEX PO) Take 1 tablet by mouth daily.    Yes [provider]  Bismuth Subsalicylate (KAOPECTATE PO) Take by mouth. 2 tablets every morning   Yes [provider]  Cyanocobalamin (VITAMIN B-12) 1000 MCG SUBL Place 1 tablet under the tongue daily.    Yes [provider]  Levomefolate Glucosamine (METHYLFOLATE PO) Take 1 tablet by mouth daily.   Yes [provider]  Lysine 500 MG CAPS Take 1 capsule by mouth 2 (two) times daily.   Yes [provider]  melatonin 3 MG TABS tablet Take 3-6 mg by mouth at  bedtime.   Yes [provider]  metoprolol succinate (TOPROL-XL) 100 MG 24 hr tablet Take 1 tablet (100 mg total) by mouth daily. Take with or  immediately following a meal. 01/04/17 11/30/2019 Yes Hackney, Aura Fey, FNP  Multiple Vitamins-Minerals (PRESERVISION AREDS 2) CAPS Take 1 capsule by mouth 2 (two) times daily.   Yes [provider]  polyethylene glycol (MIRALAX / GLYCOLAX) 17 g packet Take 17 g by mouth daily.   Yes [provider]  venlafaxine XR (EFFEXOR-XR) 75 MG 24 hr capsule Take 75 mg by mouth every morning. 10/19/19  Yes [provider]  warfarin (COUMADIN) 3 MG tablet Take 3 mg by mouth daily at 6 PM.   Yes [provider]  Specialty Vitamins Products (PROSTATE PO) Take 1 tablet by mouth daily. [place]    [provider]   Allergies  Allergen Reactions  . Baclofen     Confusion/delerium   Review of Systems  Unable to perform ROS: Age    Physical Exam Vitals and nursing note reviewed.  Constitutional:      General: He is not in acute distress.    Appearance: He is ill-appearing.     Comments: Will briefly look in my direction  Cardiovascular:     Rate and Rhythm: Normal rate.  Pulmonary:     Effort: Pulmonary effort is normal. No tachypnea.  Skin:    General: Skin is warm and dry.  Neurological:     Comments: Does not respond to orientation questions     Vital Signs: BP 106/67   Pulse (!) 28   Temp 98.3 F (36.8 C) (Oral)   Resp (!) 26   Ht '5\' 6"'  (1.676 m)   Wt 75.9 kg   SpO2 96%   BMI 27.01 kg/m          SpO2: SpO2: 96 % O2 Device:SpO2: 96 % O2 Flow Rate: .O2 Flow Rate (L/min): 12 L/min  IO: Intake/output summary:   Intake/Output Summary (Last 24 hours) at 12-17-19 1502 Last data filed at 12-17-2019 1259 Gross per 24 hour  Intake 100 ml  Output 250 ml  Net -150 ml    LBM:   Baseline Weight: Weight: 75.9 kg Most recent weight: Weight: 75.9 kg     Palliative Assessment/Data:   Flowsheet Rows     Most Recent Value  Intake Tab  Referral Department Hospitalist  Unit at Time of Referral Intermediate Care Unit  Palliative Care  Primary Diagnosis Pulmonary  Date Notified 12-17-19  Palliative Care Type New Palliative care  Reason for referral Clarify Goals of Care  Date of Admission 11/16/2019  Date first seen by Palliative Care December 17, 2019  # of days Palliative referral response time 0 Day(s)  # of days IP prior to Palliative referral 1  Clinical Assessment  Palliative Performance Scale Score 30%  Pain Max last 24 hours Not able to report  Pain Min Last 24 hours Not able to report  Dyspnea Max Last 24 Hours Not able to report  Dyspnea Min Last 24 hours Not able to report  Psychosocial & Spiritual Assessment  Palliative Care Outcomes      Time In: 1440 Time Out: 1550 Time Total: 70 minutes  Greater than 50%  of this time was spent counseling and coordinating care related to the above assessment and plan.  Signed by: Drue Novel, NP   Please contact Palliative Medicine Team phone at [phone] for questions and concerns.  For individual provider: See Shea Evans

## 2019-12-12 NOTE — Progress Notes (Signed)
*  PRELIMINARY RESULTS* Echocardiogram 2D Echocardiogram has been performed.  David Duncan 2019-12-12, 2:04 PM

## 2019-12-12 NOTE — ED Notes (Signed)
Pt placed on 6L [place]. 

## 2019-12-12 NOTE — ED Notes (Signed)
Pt SpO2 86-88% on 6L [place]. Dr Sloan Leiter notified and RT contacted to come reassess pt

## 2019-12-12 NOTE — ED Notes (Addendum)
Charge nurse and this nurse at the bedside. Pt has eyes open but currently unresponsive after pressing his call bell. Sternal rub performed with no response. HFNC in place on 12L. Skin cool to the touch. Admitting notified and expected at bedside to re-evaluate pt.

## 2019-12-12 NOTE — ED Notes (Signed)
Lab called and asked to attempt blood draw

## 2019-12-12 NOTE — ED Notes (Signed)
Admitting at the bedside.  

## 2019-12-12 NOTE — ED Notes (Signed)
Pt called out and asked about having something to eat.

## 2019-12-12 NOTE — ED Notes (Signed)
Pt sitting up on stretcher. Eyes closed. Slight increased work of breathing noted; humidified high flow [place] in place. Pt denies pain. Lights off to enhance resting. Family visiting left for day. Provided for comfort and safety and will continue to assess.

## 2019-12-12 NOTE — ED Notes (Signed)
Pt given PO metoprolol. Pt appears to be having difficulty swallow po pills and with PO intake, coughing after each swallow. Dr Sloan Leiter notified.  This RN will be holding PO pills for now

## 2019-12-12 NOTE — Progress Notes (Addendum)
David Duncan received page to support family as they heard news of pt.'s unexpected death earlier this evening; David Duncan accompanied NP to family consult rm. and provided support as they processed this difficult news.  Pt.'s wife David Duncan and two dtrs. present.  One daughter shocked and very tearful.  Family brought to pt.'s rm. to be with him; dtr.s shared they are two of four of pt.'s dtrs.  All three family members had been bedside earlier today and had had the chance to speak w/pt.  Wife shared that pt. had spoken of 'being ready to go to heaven' recently; she said his quality of life had been severely diminished by his physical limitations; when in better health, pt. enjoyed traveling with his wife.  Dtrs. warmly shared memories of their father at bedside. CH assisted in getting info for funeral release form from wife and then excused himself to allow family to be alone w/pt., perceiving no other immediate needs.  Per wife's request, David Duncan called [employer] to inform pastor of pt.'s death and family's desire to [employer]

## 2019-12-12 NOTE — ED Notes (Signed)
Attempted to call wife second time. Pt informed of pt status change and spoke with NP. Asked to come to ED to be with pt. Verbalized understanding.

## 2019-12-12 NOTE — ED Notes (Addendum)
Attempted to call pt family. VM left for wife.

## 2019-12-12 NOTE — Progress Notes (Signed)
PHARMACY - PHYSICIAN COMMUNICATION CRITICAL VALUE ALERT - BLOOD CULTURE IDENTIFICATION (BCID)  David Duncan is an 86 y.o. male who presented to [facility] on 11/29/2019 with a chief complaint of acute on chronic respiratory failure  Assessment: 1/4 bottles GPC. BCID detected Staphylococcus epidermidis, MecA positive (possible contaminant)  Name of physician (or Provider) Contacted: Dr. Sloan Leiter  Current antibiotics: Vancomycin/cefepime  Changes to prescribed antibiotics recommended:  Patient is on recommended antibiotics - No changes needed  Results for orders placed or performed during the hospital encounter of 12/07/2019  Blood Culture ID Panel (Reflexed) (Collected: 11/28/2019 11:30 AM)  Result Value Ref Range   Enterococcus faecalis NOT DETECTED NOT DETECTED   Enterococcus Faecium NOT DETECTED NOT DETECTED   Listeria monocytogenes NOT DETECTED NOT DETECTED   Staphylococcus species DETECTED (A) NOT DETECTED   Staphylococcus aureus (BCID) NOT DETECTED NOT DETECTED   Staphylococcus epidermidis DETECTED (A) NOT DETECTED   Staphylococcus lugdunensis NOT DETECTED NOT DETECTED   Streptococcus species NOT DETECTED NOT DETECTED   Streptococcus agalactiae NOT DETECTED NOT DETECTED   Streptococcus pneumoniae NOT DETECTED NOT DETECTED   Streptococcus pyogenes NOT DETECTED NOT DETECTED   A.calcoaceticus-baumannii NOT DETECTED NOT DETECTED   Bacteroides fragilis NOT DETECTED NOT DETECTED   Enterobacterales NOT DETECTED NOT DETECTED   Enterobacter cloacae complex NOT DETECTED NOT DETECTED   Escherichia coli NOT DETECTED NOT DETECTED   Klebsiella aerogenes NOT DETECTED NOT DETECTED   Klebsiella oxytoca NOT DETECTED NOT DETECTED   Klebsiella pneumoniae NOT DETECTED NOT DETECTED   Proteus species NOT DETECTED NOT DETECTED   Salmonella species NOT DETECTED NOT DETECTED   Serratia marcescens NOT DETECTED NOT DETECTED   Haemophilus influenzae NOT DETECTED NOT DETECTED   Neisseria meningitidis  NOT DETECTED NOT DETECTED   Pseudomonas aeruginosa NOT DETECTED NOT DETECTED   Stenotrophomonas maltophilia NOT DETECTED NOT DETECTED   Candida albicans NOT DETECTED NOT DETECTED   Candida auris NOT DETECTED NOT DETECTED   Candida glabrata NOT DETECTED NOT DETECTED   Candida krusei NOT DETECTED NOT DETECTED   Candida parapsilosis NOT DETECTED NOT DETECTED   Candida tropicalis NOT DETECTED NOT DETECTED   Cryptococcus neoformans/gattii NOT DETECTED NOT DETECTED   Methicillin resistance mecA/C DETECTED (A) NOT DETECTED    [name] 2019/12/07  7:47 AM

## 2019-12-12 NOTE — ED Notes (Signed)
Pt placed on 12L [place] by RT

## 2019-12-12 NOTE — ED Notes (Signed)
Dr Sloan Leiter at bedside to reassess pt for [facility]

## 2019-12-12 NOTE — ED Notes (Signed)
Pt taken to CT at this time.

## 2019-12-12 NOTE — ED Notes (Signed)
Per Dr Sloan Leiter, contact RT to try titrating pt off high flow [place].  This RN contacted RT, suggested placing pt on 6L [place]

## 2019-12-12 NOTE — ED Notes (Signed)
Pt changed into a gown, linens changed, peri care provided

## 2019-12-12 NOTE — ED Notes (Signed)
PT time of death called by NP.

## 2019-12-12 NOTE — ED Notes (Addendum)
Pt resting in bed at this time, pts HFNC in place.

## 2019-12-12 NOTE — ED Notes (Addendum)
Dr Sloan Leiter notified of INR >10 and PT >90. No new orders at this time

## 2019-12-12 NOTE — Progress Notes (Signed)
PROGRESS NOTE    David Duncan  OJJ:[id number] DOB: [birth year] DOA: 11/21/2019 PCP: Abner Greenspan, MD    Brief Narrative:  86 year old male with history of stroke, GERD, hypothyroidism, depression, anxiety, chronic atrial fibrillation on Coumadin, chronic systolic heart failure with ejection fraction 35%, bipolar disorder, BPH, CKD stage IIIb presents to the hospital with shortness of breath and altered mental status.  Recently admitted to the hospital from 7/19-7/22 and treated for pneumonia and discharged to a skilled nursing facility.  At baseline he does use 2 L of oxygen. On arrival to the ER, patient with more confusion and lethargic than baseline, 81% on 2 L oxygen treated with high flow nasal cannula overnight, BNP 1388, COVID-19 negative, INR more than 10.  Chest x-ray showed extensive multifocal infiltrates. Admitted and treated for rapid A. fib, bilateral multifocal pneumonia.   Assessment & Plan:   Principal Problem:   Acute on chronic respiratory failure with hypoxia (HCC) Active Problems:   Chronic systolic heart failure (HCC)   Atrial fibrillation with RVR (HCC)   Depression   Abnormal LFTs   Hyponatremia   CKD (chronic kidney disease), stage IIIb   Acute metabolic encephalopathy   Multifocal pneumonia   Sepsis (HCC)  Acute on chronic respiratory failure with hypoxia: Recently treated for pneumonia.  Has history of systolic CHF with ejection fraction 35%. Admission chest x-ray shows extensive bilateral infiltrates, COVID-19 is negative.  Patient with very high oxygen requirement. Will treat as healthcare acquired pneumonia, continue vancomycin and cefepime, keep on oxygen to keep saturations more than 90%.  Patient has no intubation orders.  Blood cultures pending.  Unable to expectorate any sputum. COVID-19 negative.  Will check other respiratory virus panel. In order to better clarify the picture, will do CT scan of the chest without contrast. Blood cultures  probably contaminant.  Will monitor. Lactic acid is 3.  He has CHF with ejection fraction 35%, will avoid further IV fluids as he is perfusing well.  Chronic systolic heart failure: proBNP is elevated.  No leg edema.  No JVD.  Unsure whether this is presentation from CHF.  Currently unable to tolerate any diuretics.  Given gentle IV fluids overnight.  We'll recheck an echocardiogram to reassess his EF.  CT chest with also be able to better define the infection versus fluid. Continue beta-blockers. Hold Entresto given abnormal renal functions.  Chronic A. fib with RVR: His heart rate is 120-130s with hypoxia.  Was started on Cardizem drip that caused decrease in blood pressures.  Discontinue Cardizem drip.  He is on metoprolol XL, will start 1st dose now.  If persistent elevated heart rate, will consult cardiology, and is started on amiodarone to avoid dropping heart rate.  Supratherapeutic INR/coagulopathy due to Coumadin use: Presentation INR more than 10, 1 dose of vitamin K 5 mg by mouth given.  Repeat INR more than 10.  No evidence of bleeding.  CT head was normal.  Patient with altered mental status, will be difficult to monitor for internal bleeding.  Will reverse his Coumadin levels.  10 mg subcu vitamin K now.  Acute metabolic encephalopathy: Probably due to #1.  CT head was negative.  Examination is nonfocal.  We'll continue to monitor.  Hypovolemic hyponatremia: Treated with isotonic fluid with improvement.  Chronic kidney disease stage IIIb: Creatinine is a stable and making adequate urine.   DVT prophylaxis: INR more than 10.   Code Status: DNR. Family Communication: We'll call patient's wife. Disposition Plan: Status is: Inpatient  Remains inpatient appropriate because:Hemodynamically unstable and IV treatments appropriate due to intensity of illness or inability to take PO   Dispo: The patient is from: Home              Anticipated d/c is to: Home              Anticipated  d/c date is: 3 days              Patient currently is not medically stable to d/c.         Consultants:   Cardiology  Procedures:   None  Antimicrobials:  Antibiotics Given (last 72 hours)    Date/Time Action Medication Dose Rate   12/11/2019 1147 New Bag/Given   ceFEPIme (MAXIPIME) 2 g in sodium chloride 0.9 % 100 mL IVPB 2 g 200 mL/hr   11/24/2019 1214 New Bag/Given   vancomycin (VANCOCIN) IVPB 1000 mg/200 mL premix 1,000 mg 200 mL/hr   11/19/2019 1612 New Bag/Given   vancomycin (VANCOREADY) IVPB 750 mg/150 mL 750 mg 150 mL/hr         Subjective: Patient was seen and examined.  Remains in the emergency room waiting for inpatient bed assignment.  Remained on high flow nasal cannula overnight, saturating adequate.  He is poor historian. He tells me that his breathing is better than how it was yesterday.  He looks comfortable on high flow. Overnight telemetry monitor shows elevated heart rate 120-130s.  Blood pressure has been stable after discontinuing Cardizem drip.  No family at the bedside.  Objective: Vitals:   11/11/2019 2132 2019-12-07 0730 12-07-19 0745 2019-12-07 0908  BP:  (!) 119/99 (!) 124/104 111/84  Pulse: (!) 127  (!) 122 (!) 123  Resp: (!) 23 (!) 26 (!) 26   Temp:      TempSrc:      SpO2: 95%  98%   Weight:      Height:       No intake or output data in the 24 hours ending 12-07-19 0914 Filed Weights   11/28/2019 1155  Weight: 75.9 kg    Examination:  General exam: Appears calm and comfortable, acutely and chronically sick looking.  Not in any distress. Pleasant to conversation, confused and alert oriented x1. Respiratory system: Very poor bilateral air entry.  No added sound appreciable. Cardiovascular system: S1 & S2 heard, irregularly irregular.  No pedal edema. Gastrointestinal system: Abdomen is nondistended, soft and nontender. No organomegaly or masses felt. Normal bowel sounds heard. Extremities: Symmetric 5 x 5 power.  Generalized  weakness.  Data Reviewed: I have personally reviewed following labs and imaging studies  CBC: Recent Labs  Lab 11/26/2019 1055 12/07/2019 0749  WBC 11.9* 13.6*  NEUTROABS 10.2*  --   HGB 12.6* 11.8*  HCT 37.4* 34.3*  MCV 100.0 97.2  PLT 236 712   Basic Metabolic Panel: Recent Labs  Lab 11/18/2019 1055 Dec 07, 2019 0749  NA 128* 130*  K 4.9 4.5  CL 96* 97*  CO2 18* 16*  GLUCOSE 103* 104*  BUN 41* 50*  CREATININE 1.89* 1.70*  CALCIUM 8.8* 8.9   GFR: Estimated Creatinine Clearance: 27.6 mL/min (A) (by C-G formula based on SCr of 1.7 mg/dL (H)). Liver Function Tests: Recent Labs  Lab 12/01/2019 1055  AST 287*  ALT 162*  ALKPHOS 115  BILITOT 1.5*  PROT 7.9  ALBUMIN 3.2*   No results for input(s): LIPASE, AMYLASE in the last 168 hours. Recent Labs  Lab 11/21/19 1609  AMMONIA 37*  Coagulation Profile: Recent Labs  Lab 12/06/2019 1055 12/03/2019 0749  INR >10.0* >10.0*   Cardiac Enzymes: No results for input(s): CKTOTAL, CKMB, CKMBINDEX, TROPONINI in the last 168 hours. BNP (last 3 results) No results for input(s): PROBNP in the last 8760 hours. HbA1C: No results for input(s): HGBA1C in the last 72 hours. CBG: No results for input(s): GLUCAP in the last 168 hours. Lipid Profile: No results for input(s): CHOL, HDL, LDLCALC, TRIG, CHOLHDL, LDLDIRECT in the last 72 hours. Thyroid Function Tests: No results for input(s): TSH, T4TOTAL, FREET4, T3FREE, THYROIDAB in the last 72 hours. Anemia Panel: No results for input(s): VITAMINB12, FOLATE, FERRITIN, TIBC, IRON, RETICCTPCT in the last 72 hours. Sepsis Labs: Recent Labs  Lab 12/10/2019 1130 12/06/2019 1609 12/01/2019 2101 11/11/2019 2333 11/15/2019 0749  PROCALCITON  --   --   --  0.53  --   LATICACIDVEN 3.5* 2.8* 3.4*  --  3.1*    Recent Results (from the past 240 hour(s))  Blood culture (routine single)     Status: None (Preliminary result)   Collection Time: 12/05/2019 11:30 AM   Specimen: BLOOD  Result Value Ref Range  Status   Specimen Description BLOOD BLOOD LEFT HAND  Final   Special Requests   Final    BOTTLES DRAWN AEROBIC AND ANAEROBIC Blood Culture adequate volume   Culture  Setup Time   Final    GRAM POSITIVE COCCI AEROBIC BOTTLE ONLY Organism ID to follow CRITICAL RESULT CALLED TO, READ BACK BY AND VERIFIED WITH: SHEEMA HALLAJI AT 9628 ON 23-Nov-2019 [place]. Performed at [facility], [address] [place], [place] [address]    Culture [facility] POSITIVE COCCI  Final   Report Status PENDING  Incomplete  SARS Coronavirus 2 by RT PCR (hospital order, performed in [facility] hospital lab) Nasopharyngeal Nasopharyngeal Swab     Status: None   Collection Time: 11/20/2019 11:30 AM   Specimen: Nasopharyngeal Swab  Result Value Ref Range Status   SARS Coronavirus 2 NEGATIVE NEGATIVE Final    Comment: (NOTE) SARS-CoV-2 target nucleic acids are NOT DETECTED.  The SARS-CoV-2 RNA is generally detectable in upper and lower respiratory specimens during the acute phase of infection. The lowest concentration of SARS-CoV-2 viral copies this assay can detect is 250 copies / mL. A negative result does not preclude SARS-CoV-2 infection and should not be used as the sole basis for treatment or other patient management decisions.  A negative result may occur with improper specimen collection / handling, submission of specimen other than nasopharyngeal swab, presence of viral mutation(s) within the areas targeted by this assay, and inadequate number of viral copies (<250 copies / mL). A negative result must be combined with clinical observations, patient history, and epidemiological information.  Fact Sheet for Patients:   StrictlyIdeas.no  Fact Sheet for Healthcare Providers: BankingDealers.co.za  This test is not yet approved or  cleared by the [place] FDA and has been authorized for detection and/or diagnosis of SARS-CoV-2 by FDA under an Emergency  Use Authorization (EUA).  This EUA will remain in effect (meaning this test can be used) for the duration of the COVID-19 declaration under Section 564(b)(1) of the Act, 21 U.S.C. section 360bbb-3(b)(1), unless the authorization is terminated or revoked sooner.  Performed at [facility], [address], [place], [place] [address]   Blood Culture ID Panel (Reflexed)     Status: Abnormal   Collection Time: 11/17/2019 11:30 AM  Result Value Ref Range Status   Enterococcus faecalis  NOT DETECTED NOT DETECTED Final   Enterococcus Faecium NOT DETECTED NOT DETECTED Final   Listeria monocytogenes NOT DETECTED NOT DETECTED Final   Staphylococcus species DETECTED (A) NOT DETECTED Final    Comment: CRITICAL RESULT CALLED TO, READ BACK BY AND VERIFIED WITH: SHEEMA HALLAJI AT 0728 ON 11-26-2019 [place].    Staphylococcus aureus (BCID) NOT DETECTED NOT DETECTED Final   Staphylococcus epidermidis DETECTED (A) NOT DETECTED Final    Comment: Methicillin (oxacillin) resistant coagulase negative staphylococcus. Possible blood culture contaminant (unless isolated from more than one blood culture draw or clinical case suggests pathogenicity). No antibiotic treatment is indicated for blood  culture contaminants. CRITICAL RESULT CALLED TO, READ BACK BY AND VERIFIED WITH: SHEEMA HALLAJI AT 6378 ON 11/26/19 [place].    Staphylococcus lugdunensis NOT DETECTED NOT DETECTED Final   Streptococcus species NOT DETECTED NOT DETECTED Final   Streptococcus agalactiae NOT DETECTED NOT DETECTED Final   Streptococcus pneumoniae NOT DETECTED NOT DETECTED Final   Streptococcus pyogenes NOT DETECTED NOT DETECTED Final   A.calcoaceticus-baumannii NOT DETECTED NOT DETECTED Final   Bacteroides fragilis NOT DETECTED NOT DETECTED Final   Enterobacterales NOT DETECTED NOT DETECTED Final   Enterobacter cloacae complex NOT DETECTED NOT DETECTED Final   Escherichia coli NOT DETECTED NOT DETECTED Final   Klebsiella aerogenes NOT  DETECTED NOT DETECTED Final   Klebsiella oxytoca NOT DETECTED NOT DETECTED Final   Klebsiella pneumoniae NOT DETECTED NOT DETECTED Final   Proteus species NOT DETECTED NOT DETECTED Final   Salmonella species NOT DETECTED NOT DETECTED Final   Serratia marcescens NOT DETECTED NOT DETECTED Final   Haemophilus influenzae NOT DETECTED NOT DETECTED Final   Neisseria meningitidis NOT DETECTED NOT DETECTED Final   Pseudomonas aeruginosa NOT DETECTED NOT DETECTED Final   Stenotrophomonas maltophilia NOT DETECTED NOT DETECTED Final   Candida albicans NOT DETECTED NOT DETECTED Final   Candida auris NOT DETECTED NOT DETECTED Final   Candida glabrata NOT DETECTED NOT DETECTED Final   Candida krusei NOT DETECTED NOT DETECTED Final   Candida parapsilosis NOT DETECTED NOT DETECTED Final   Candida tropicalis NOT DETECTED NOT DETECTED Final   Cryptococcus neoformans/gattii NOT DETECTED NOT DETECTED Final   Methicillin resistance mecA/C DETECTED (A) NOT DETECTED Final    Comment: CRITICAL RESULT CALLED TO, READ BACK BY AND VERIFIED WITH: SHEEMA HALLAJI AT 0728 ON 11-26-19 [place]. Performed at [facility], [address] [place], [place] [address]   Culture, blood (single)     Status: None (Preliminary result)   Collection Time: 12/01/2019  4:09 PM   Specimen: BLOOD  Result Value Ref Range Status   Specimen Description BLOOD BLOOD RIGHT WRIST  Final   Special Requests   Final    BOTTLES DRAWN AEROBIC AND ANAEROBIC Blood Culture adequate volume   Culture   Final    NO GROWTH < 24 HOURS Performed at [facility], [address], [place], [place] [address]    Report Status PENDING  Incomplete         Radiology Studies: CT HEAD WO CONTRAST  Result Date: Nov 26, 2019 CLINICAL DATA:  86 year old male with altered mental status. EXAM: CT HEAD WITHOUT CONTRAST TECHNIQUE: Contiguous axial images were obtained from the base of the skull through the vertex without intravenous contrast.  COMPARISON:  Brain MRI 11/19/2017.  Head CT 10/30/2019. FINDINGS: Brain: Stable cerebral volume.  Mild motion artifact today. Chronic encephalomalacia in the left external capsule. Stable Patchy and confluent white matter hypodensity. No midline shift, ventriculomegaly, mass effect, evidence  of mass lesion, intracranial hemorrhage or evidence of cortically based acute infarction. Vascular: Extensive Calcified atherosclerosis at the skull base. No suspicious intracranial vascular hyperdensity. Skull: No acute osseous abnormality identified. Sinuses/Orbits: Resolved left maxillary sinus fluid level. Stable trace fluid in the sphenoid sinuses. Tympanic cavities and mastoids remain clear. Other: No acute orbit or scalp soft tissue finding. IMPRESSION: 1. No acute intracranial abnormality. 2. Stable non contrast CT appearance of chronic small vessel ischemia. Electronically Signed   By: Genevie Ann M.D.   On: 11/24/2019 02:54   DG Chest Portable 1 View  Result Date: 11/26/2019 CLINICAL DATA:  Worsening shortness of breath, pneumonia EXAM: PORTABLE CHEST 1 VIEW COMPARISON:  10/30/2019 FINDINGS: Multifocal patchy opacities, subpleural/peripheral and lower lobe predominant, compatible with multifocal pneumonia. No pleural effusion or pneumothorax. Cardiomegaly. IMPRESSION: Multifocal pneumonia, new/progressive. Atypical/viral etiologies including COVID are possible. Electronically Signed   By: Julian Hy M.D.   On: 12/09/2019 11:42        Scheduled Meds: . albuterol  5 mg Nebulization Once  . vitamin C  500 mg Oral Daily  . ipratropium  0.5 mg Nebulization Q4H  . melatonin  5 mg Oral QHS  . metoprolol succinate  100 mg Oral Daily  . multivitamin  1 tablet Oral Daily  . multivitamin-lutein  1 capsule Oral BID  . phytonadione  10 mg Subcutaneous Once  . phytonadione  5 mg Oral Once  . polyethylene glycol  17 g Oral Daily  . venlafaxine XR  75 mg Oral q morning - 10a  . vitamin B-12  1,000 mcg Oral  Daily   Continuous Infusions: . ceFEPime (MAXIPIME) IV    . vancomycin       LOS: 1 day    Time spent: 35 min    Barb Merino, MD [facility] Pager [phone]

## 2019-12-12 NOTE — Progress Notes (Signed)
Patient seen and examined today repeatedly because of concern of decompensation and high oxygen demand.  He remains about the same on 12 L of oxygen confused.  Occasionally short of breath and belly breathing.  Plan: stat chest x-ray was done and reviewed that shows similar to earlier CT scan.  No evidence of pneumothorax or other complications. I called and updated patient's wife.  Patient remains in very tenuous condition.  Patient's daughter also at the bedside. We will continue to try with oxygen, BiPAP if needed and see if he responds. If patient to further decompensate on BiPAP, we should call patient's wife to come to the hospital and start him on comfort care measures.

## 2019-12-12 NOTE — ED Notes (Addendum)
Pt noted to appear [facility] and using accessory muscles. Vitals stable, 96% on 12L. Dr Sloan Leiter and pallative messaged via secure chat to see if interventions are needed for comfort Dr Sloan Leiter to order lasix

## 2019-12-12 NOTE — Progress Notes (Addendum)
Cross Cover Brief Note Notified by RN patient with acute decompensation in neuro and respiratory status with low blood pressure Bedside patient dusky, sats 70, agonal breathing, systolic pressures in 54'I on nonrebreather No puroposeful response to noxious stimuli. Pupils nonreactive Normal saline bolus started. Unable to initiate bipap secondary to decreased mental status. Family was called and notified pf change in patient condition.  No change in previous plan requested.  Patient declined rapidly with cessation of breathing efforts and asystole. No neuro response. Time of death called 30-Jul-2132. Wife and 2 daughters informed in family room of patient death Chapl[place] present.  Support provided and family escorted to patient bedside.

## 2019-12-12 DEATH — deceased

## 2020-01-11 NOTE — Death Summary Note (Signed)
DEATH SUMMARY   Patient Details  Name: David Duncan MRN: [id number] DOB: 06-24-1930  Admission/Discharge Information   Admit Date:  12/17/2019  Date of Death: Date of Death: 12/18/2019  Time of Death: Time of Death: 07-28-2132  Length of Stay: 2  Referring Physician: Tower, Wynelle Fanny, MD   Reason(s) for Hospitalization  Shortness of breath and altered mental status.  Diagnoses  Preliminary cause of death:  Secondary Diagnoses (including complications and co-morbidities):  Principal Problem:   Acute on chronic respiratory failure with hypoxia (HCC) Active Problems:   Chronic systolic heart failure (HCC)   Atrial fibrillation with RVR (HCC)   Depression   Abnormal LFTs   Hyponatremia   CKD (chronic kidney disease), stage IIIb   Acute metabolic encephalopathy   Multifocal pneumonia   Sepsis [name])   Brief Hospital Course (including significant findings, care, treatment, and services provided and events leading to death)  David Duncan is a 86 y.o. year old male who has extensive medical history including history of a stroke, GERD, hypothyroidism, depression anxiety, chronic atrial fibrillation on Coumadin and chronic congestive heart failure with known ejection fraction of 35%, bipolar disorder CKD stage IIIb presented to the emergency room with shortness of breath and altered mental status.  He was recently hospitalized and treated for pneumonia and discharged to a skilled nursing facility.  Patient was living in an assisted living place.  On arrival to the emergency room, he was hypoxic, 81% on 2 L of oxygen that he used to use at home.  His INR was more than 10.  COVID-19 were negative.  Chest x-ray showed multifocal pneumonia.  He was also found to have rapid A. fib on admission.  Patient was DNR with no wishes for resuscitation and intubation.  Patient was admitted to the hospital with acute on chronic respiratory failure with hypoxia with multifocal pneumonia, also suspected  systolic heart failure exacerbation with known ejection fraction 35%.  Patient was aggressively treated with broad-spectrum antibiotics, oxygen, bronchodilator therapy.  His INR was reversed with vitamin K.  CT head was negative for any bleeding.  He was getting treatment in the hospital, however, despite maximum medical therapy patient did not improve and died in the hospital with progressive hypoxia.  Causes of death Acute on chronic respiratory failure with hypoxemia due to multifocal pneumonia Acute on chronic systolic heart failure with underlying multiple medical issues.    Pertinent Labs and Studies  Significant Diagnostic Studies CT HEAD WO CONTRAST  Result Date: 18-Dec-2019 CLINICAL DATA:  86 year old male with altered mental status. EXAM: CT HEAD WITHOUT CONTRAST TECHNIQUE: Contiguous axial images were obtained from the base of the skull through the vertex without intravenous contrast. COMPARISON:  Brain MRI 11/19/2017.  Head CT 10/30/2019. FINDINGS: Brain: Stable cerebral volume.  Mild motion artifact today. Chronic encephalomalacia in the left external capsule. Stable Patchy and confluent white matter hypodensity. No midline shift, ventriculomegaly, mass effect, evidence of mass lesion, intracranial hemorrhage or evidence of cortically based acute infarction. Vascular: Extensive Calcified atherosclerosis at the skull base. No suspicious intracranial vascular hyperdensity. Skull: No acute osseous abnormality identified. Sinuses/Orbits: Resolved left maxillary sinus fluid level. Stable trace fluid in the sphenoid sinuses. Tympanic cavities and mastoids remain clear. Other: No acute orbit or scalp soft tissue finding. IMPRESSION: 1. No acute intracranial abnormality. 2. Stable non contrast CT appearance of chronic small vessel ischemia. Electronically Signed   By: Genevie Ann M.D.   On: 12/18/2019 02:54   CT CHEST WO CONTRAST  Result Date: 21-Dec-2019 CLINICAL DATA:  Pneumonia. EXAM: CT CHEST  WITHOUT CONTRAST TECHNIQUE: Multidetector CT imaging of the chest was performed following the standard protocol without IV contrast. COMPARISON:  None.  Chest x-ray 11/24/2019 FINDINGS: Cardiovascular: Heart is enlarged. No substantial pericardial effusion. Coronary artery calcification is evident. Atherosclerotic calcification is noted in the wall of the thoracic aorta. Mediastinum/Nodes: Mediastinal lymphadenopathy identified with index lymph nodes measuring 13 mm short axis (right thoracic inlet on 35/2), 16 mm short axis (low right paratracheal on 50/2), 14 mm short axis (subcarinal on 64/2), and 11 mm short axis (pre-vascular on 58/2). Soft tissue fullness in the hilar regions suggest nodal tissue. The esophagus has normal imaging features. There is no axillary lymphadenopathy. Lungs/Pleura: There is patchy diffuse ground-glass opacity in the lungs bilaterally with some minimal sparing in the apices but no substantial gradient distribution. Tiny bilateral pleural effusions associated. Upper Abdomen: Lobular liver with subtle nodularity of contour suggest cirrhosis. Multiple exophytic left renal lesions approach water density and are likely cysts but have been incompletely visualized. Musculoskeletal: No worrisome lytic or sclerotic osseous abnormality. IMPRESSION: 1. Diffuse patchy ground-glass opacity in the lungs bilaterally. Imaging features nonspecific but likely related to multifocal pneumonia given associated mediastinal and presumed hilar lymphadenopathy. Edema and hemorrhage could also have this appearance. 2. Small bilateral pleural effusions 3. Mediastinal and probable bilateral hilar lymphadenopathy, likely reactive. Follow-up CT chest in 3 months recommended to ensure resolution. 4. Lobular liver with subtle nodularity of contour suggests cirrhosis. 5. Aortic Atherosclerosis (ICD10-I70.0). Electronically Signed   By: Misty Stanley M.D.   On: 12-21-2019 10:10   DG Chest Port 1 View  Result Date:  12/21/2019 CLINICAL DATA:  Shortness of breath EXAM: PORTABLE CHEST 1 VIEW COMPARISON:  Chest x-ray dated . FINDINGS: Heart size and mediastinal contours are stable. Diffuse bilateral airspace opacities are stable, corresponding to diffuse ground-glass opacities on chest CT performed earlier same day. No pleural effusion or pneumothorax is seen. Small bilateral pleural effusions demonstrated on today's earlier chest CT. Osseous structures about the chest are unremarkable. IMPRESSION: No significant change compared to yesterday's chest x-ray. Diffuse bilateral airspace opacities, corresponding to diffuse ground-glass opacities on chest CT performed earlier same day, compatible with multifocal pneumonia versus pulmonary edema. Electronically Signed   By: Franki Cabot M.D.   On: 2019-12-21 18:32   DG Chest Portable 1 View  Result Date: 12/04/2019 CLINICAL DATA:  Worsening shortness of breath, pneumonia EXAM: PORTABLE CHEST 1 VIEW COMPARISON:  10/30/2019 FINDINGS: Multifocal patchy opacities, subpleural/peripheral and lower lobe predominant, compatible with multifocal pneumonia. No pleural effusion or pneumothorax. Cardiomegaly. IMPRESSION: Multifocal pneumonia, new/progressive. Atypical/viral etiologies including COVID are possible. Electronically Signed   By: Julian Hy M.D.   On: 11/25/2019 11:42   ECHOCARDIOGRAM COMPLETE  Result Date: 2019/12/21    ECHOCARDIOGRAM REPORT   Patient Name:   ARTURO SOFRANKO Date of Exam: 12-21-2019 Medical Rec #:  [id number]         Height:       66.0 in Accession #:    [id number]        Weight:       167.3 lb Date of Birth:  June 17, 1930         BSA:          1.854 m Patient Age:    54 years          BP:           120/82 mmHg Patient Gender: M  HR:           116 bpm. Exam Location:  [facility] Procedure: 2D Echo, Color Doppler and Cardiac Doppler Indications:     W11.91 CHF-Acute Systolic  History:         Patient has no prior history of Echocardiogram  examinations.                  CHF and Cardiomyopathy, Stroke, Arrythmias:Atrial Fibrillation;                  Risk Factors:Dyslipidemia.  Sonographer:     Charmayne Sheer RDCS (AE) Referring Phys:  [id number] Barb Merino Diagnosing Phys: Isaias Cowman MD IMPRESSIONS  1. Left ventricular ejection fraction, by estimation, is 25 to 30%. The left ventricle has severely decreased function. The left ventricle has no regional wall motion abnormalities. The left ventricular internal cavity size was mildly dilated. Left ventricular diastolic parameters were normal.  2. Right ventricular systolic function is normal. The right ventricular size is normal. There is moderately elevated pulmonary artery systolic pressure.  3. Left atrial size was mild to moderately dilated.  4. Right atrial size was moderately dilated.  5. The mitral valve is normal in structure. Mild to moderate mitral valve regurgitation. No evidence of mitral stenosis.  6. Tricuspid valve regurgitation is moderate to severe.  7. The aortic valve is normal in structure. Aortic valve regurgitation is not visualized. No aortic stenosis is present.  8. The inferior vena cava is normal in size with greater than 50% respiratory variability, suggesting right atrial pressure of 3 mmHg. FINDINGS  Left Ventricle: Left ventricular ejection fraction, by estimation, is 25 to 30%. The left ventricle has severely decreased function. The left ventricle has no regional wall motion abnormalities. The left ventricular internal cavity size was mildly dilated. There is no left ventricular hypertrophy. Left ventricular diastolic parameters were normal. Right Ventricle: The right ventricular size is normal. No increase in right ventricular wall thickness. Right ventricular systolic function is normal. There is moderately elevated pulmonary artery systolic pressure. The tricuspid regurgitant velocity is 3.02 m/s, and with an assumed right atrial pressure of 10 mmHg, the estimated  right ventricular systolic pressure is 21.3 mmHg. Left Atrium: Left atrial size was mild to moderately dilated. Right Atrium: Right atrial size was moderately dilated. Pericardium: There is no evidence of pericardial effusion. Mitral Valve: The mitral valve is normal in structure. Normal mobility of the mitral valve leaflets. Mild to moderate mitral valve regurgitation. No evidence of mitral valve stenosis. MV peak gradient, 4.5 mmHg. The mean mitral valve gradient is 2.0 mmHg. Tricuspid Valve: The tricuspid valve is normal in structure. Tricuspid valve regurgitation is moderate to severe. No evidence of tricuspid stenosis. Aortic Valve: The aortic valve is normal in structure. Aortic valve regurgitation is not visualized. No aortic stenosis is present. Aortic valve mean gradient measures 4.0 mmHg. Aortic valve peak gradient measures 7.0 mmHg. Aortic valve area, by VTI measures 0.87 cm. Pulmonic Valve: The pulmonic valve was normal in structure. Pulmonic valve regurgitation is not visualized. No evidence of pulmonic stenosis. Aorta: The aortic root is normal in size and structure. Venous: The inferior vena cava is normal in size with greater than 50% respiratory variability, suggesting right atrial pressure of 3 mmHg. IAS/Shunts: No atrial level shunt detected by color flow Doppler.  LEFT VENTRICLE PLAX 2D LVIDd:         4.78 cm  Diastology LVIDs:         4.12 cm  LV e' lateral:   10.30 cm/s LV PW:         0.88 cm  LV E/e' lateral: 7.1 LV IVS:        0.69 cm  LV e' medial:    10.20 cm/s LVOT diam:     1.90 cm  LV E/e' medial:  7.1 LV SV:         17 LV SV Index:   9 LVOT Area:     2.84 cm  RIGHT VENTRICLE RV Basal diam:  3.98 cm LEFT ATRIUM             Index       RIGHT ATRIUM           Index LA diam:        4.10 cm 2.21 cm/m  RA Area:     31.40 cm LA Vol (A2C):   53.3 ml 28.75 ml/m RA Volume:   115.00 ml 62.03 ml/m LA Vol (A4C):   47.1 ml 25.41 ml/m LA Biplane Vol: 54.5 ml 29.40 ml/m  AORTIC VALVE                    PULMONIC VALVE AV Area (Vmax):    0.97 cm    PV Vmax:       0.51 m/s AV Area (Vmean):   0.94 cm    PV Vmean:      31.600 cm/s AV Area (VTI):     0.87 cm    PV VTI:        0.067 m AV Vmax:           132.00 cm/s PV Peak grad:  1.0 mmHg AV Vmean:          90.100 cm/s PV Mean grad:  0.0 mmHg AV VTI:            0.192 m AV Peak Grad:      7.0 mmHg AV Mean Grad:      4.0 mmHg LVOT Vmax:         45.10 cm/s LVOT Vmean:        29.800 cm/s LVOT VTI:          0.059 m LVOT/AV VTI ratio: 0.31  AORTA Ao Root diam: 3.00 cm MITRAL VALVE               TRICUSPID VALVE MV Area (PHT): 3.92 cm    TR Peak grad:   36.5 mmHg MV Peak grad:  4.5 mmHg    TR Vmax:        302.00 cm/s MV Mean grad:  2.0 mmHg MV Vmax:       1.06 m/s    SHUNTS MV Vmean:      73.6 cm/s   Systemic VTI:  0.06 m MV Decel Time: 194 msec    Systemic Diam: 1.90 cm MV E velocity: 72.80 cm/s MV A velocity: 37.80 cm/s MV E/A ratio:  1.93 Isaias Cowman MD Electronically signed by Isaias Cowman MD Signature Date/Time: December 15, 2019/3:24:02 PM    Final     Microbiology No results found for this or any previous visit (from the past 240 hour(s)).  Lab Basic Metabolic Panel: No results for input(s): NA, K, CL, CO2, GLUCOSE, BUN, CREATININE, CALCIUM, MG, PHOS in the last 168 hours. Liver Function Tests: No results for input(s): AST, ALT, ALKPHOS, BILITOT, PROT, ALBUMIN in the last 168 hours. No results for input(s): LIPASE, AMYLASE in the last 168 hours. No results for input(s): AMMONIA in  the last 168 hours. CBC: No results for input(s): WBC, NEUTROABS, HGB, HCT, MCV, PLT in the last 168 hours. Cardiac Enzymes: No results for input(s): CKTOTAL, CKMB, CKMBINDEX, TROPONINI in the last 168 hours. Sepsis Labs: No results for input(s): PROCALCITON, WBC, LATICACIDVEN in the last 168 hours.  Procedures/Operations     [employer] 12/13/2019, 11:28 AM

## 2020-05-07 ENCOUNTER — Telehealth: Payer: PPO
